# Patient Record
Sex: Female | Born: 1970
Health system: Southern US, Community
[De-identification: ages and names within clinical notes are randomized; demographics above are authoritative.]

## PROBLEM LIST (undated history)

## (undated) DIAGNOSIS — E559 Vitamin D deficiency, unspecified: Secondary | ICD-10-CM

## (undated) DIAGNOSIS — I10 Essential (primary) hypertension: Secondary | ICD-10-CM

## (undated) DIAGNOSIS — F32A Depression, unspecified: Secondary | ICD-10-CM

## (undated) DIAGNOSIS — Z8489 Family history of other specified conditions: Secondary | ICD-10-CM

## (undated) DIAGNOSIS — I77819 Aortic ectasia, unspecified site: Secondary | ICD-10-CM

## (undated) DIAGNOSIS — K5790 Diverticulosis of intestine, part unspecified, without perforation or abscess without bleeding: Secondary | ICD-10-CM

## (undated) DIAGNOSIS — C539 Malignant neoplasm of cervix uteri, unspecified: Secondary | ICD-10-CM

## (undated) DIAGNOSIS — E039 Hypothyroidism, unspecified: Secondary | ICD-10-CM

## (undated) DIAGNOSIS — M47817 Spondylosis without myelopathy or radiculopathy, lumbosacral region: Secondary | ICD-10-CM

## (undated) DIAGNOSIS — F419 Anxiety disorder, unspecified: Secondary | ICD-10-CM

## (undated) DIAGNOSIS — R053 Chronic cough: Secondary | ICD-10-CM

## (undated) DIAGNOSIS — R002 Palpitations: Secondary | ICD-10-CM

## (undated) DIAGNOSIS — F329 Major depressive disorder, single episode, unspecified: Secondary | ICD-10-CM

## (undated) DIAGNOSIS — C801 Malignant (primary) neoplasm, unspecified: Secondary | ICD-10-CM

## (undated) DIAGNOSIS — R7303 Prediabetes: Secondary | ICD-10-CM

## (undated) DIAGNOSIS — J189 Pneumonia, unspecified organism: Secondary | ICD-10-CM

## (undated) DIAGNOSIS — D649 Anemia, unspecified: Secondary | ICD-10-CM

## (undated) DIAGNOSIS — G2581 Restless legs syndrome: Secondary | ICD-10-CM

## (undated) DIAGNOSIS — R05 Cough: Secondary | ICD-10-CM

## (undated) DIAGNOSIS — Z860101 Personal history of adenomatous and serrated colon polyps: Secondary | ICD-10-CM

## (undated) DIAGNOSIS — Z86010 Personal history of colonic polyps: Secondary | ICD-10-CM

## (undated) DIAGNOSIS — R112 Nausea with vomiting, unspecified: Secondary | ICD-10-CM

## (undated) DIAGNOSIS — G8929 Other chronic pain: Secondary | ICD-10-CM

## (undated) DIAGNOSIS — E119 Type 2 diabetes mellitus without complications: Secondary | ICD-10-CM

## (undated) DIAGNOSIS — R200 Anesthesia of skin: Secondary | ICD-10-CM

## (undated) DIAGNOSIS — Z8701 Personal history of pneumonia (recurrent): Secondary | ICD-10-CM

## (undated) DIAGNOSIS — E785 Hyperlipidemia, unspecified: Secondary | ICD-10-CM

## (undated) DIAGNOSIS — K219 Gastro-esophageal reflux disease without esophagitis: Secondary | ICD-10-CM

## (undated) DIAGNOSIS — Z8709 Personal history of other diseases of the respiratory system: Secondary | ICD-10-CM

## (undated) DIAGNOSIS — IMO0002 Reserved for concepts with insufficient information to code with codable children: Secondary | ICD-10-CM

## (undated) DIAGNOSIS — Z9889 Other specified postprocedural states: Secondary | ICD-10-CM

## (undated) DIAGNOSIS — IMO0001 Reserved for inherently not codable concepts without codable children: Secondary | ICD-10-CM

## (undated) DIAGNOSIS — J309 Allergic rhinitis, unspecified: Secondary | ICD-10-CM

## (undated) DIAGNOSIS — R011 Cardiac murmur, unspecified: Secondary | ICD-10-CM

## (undated) DIAGNOSIS — R000 Tachycardia, unspecified: Secondary | ICD-10-CM

## (undated) DIAGNOSIS — G4733 Obstructive sleep apnea (adult) (pediatric): Secondary | ICD-10-CM

## (undated) DIAGNOSIS — E7800 Pure hypercholesterolemia, unspecified: Secondary | ICD-10-CM

## (undated) DIAGNOSIS — M549 Dorsalgia, unspecified: Secondary | ICD-10-CM

## (undated) DIAGNOSIS — R0600 Dyspnea, unspecified: Secondary | ICD-10-CM

## (undated) DIAGNOSIS — J383 Other diseases of vocal cords: Secondary | ICD-10-CM

## (undated) DIAGNOSIS — E669 Obesity, unspecified: Secondary | ICD-10-CM

## (undated) HISTORY — DX: Obesity, unspecified: E66.9

## (undated) HISTORY — PX: OTHER SURGICAL HISTORY: SHX169

## (undated) HISTORY — PX: TUBAL LIGATION: SHX77

## (undated) HISTORY — DX: Spondylosis without myelopathy or radiculopathy, lumbosacral region: M47.817

## (undated) HISTORY — DX: Essential (primary) hypertension: I10

## (undated) HISTORY — DX: Vitamin D deficiency, unspecified: E55.9

## (undated) HISTORY — DX: Prediabetes: R73.03

## (undated) HISTORY — DX: Aortic ectasia, unspecified site: I77.819

## (undated) HISTORY — DX: Anemia, unspecified: D64.9

## (undated) HISTORY — PX: KNEE ARTHROSCOPY: SUR90

## (undated) HISTORY — DX: Obstructive sleep apnea (adult) (pediatric): G47.33

## (undated) HISTORY — DX: Hypothyroidism, unspecified: E03.9

---

## 1992-09-16 HISTORY — PX: CHOLECYSTECTOMY: SHX55

## 1994-09-16 HISTORY — PX: ABDOMINAL HYSTERECTOMY: SHX81

## 2002-09-16 HISTORY — PX: NOSE SURGERY: SHX723

## 2003-03-24 ENCOUNTER — Encounter: Payer: Self-pay | Admitting: Internal Medicine

## 2003-03-24 ENCOUNTER — Ambulatory Visit (HOSPITAL_COMMUNITY): Admission: RE | Admit: 2003-03-24 | Discharge: 2003-03-24 | Payer: Self-pay | Admitting: Family Medicine

## 2003-07-04 ENCOUNTER — Encounter: Payer: Self-pay | Admitting: Internal Medicine

## 2005-11-15 ENCOUNTER — Ambulatory Visit: Payer: Self-pay | Admitting: Cardiology

## 2005-12-12 ENCOUNTER — Ambulatory Visit: Payer: Self-pay | Admitting: *Deleted

## 2006-09-23 ENCOUNTER — Emergency Department (HOSPITAL_COMMUNITY): Admission: EM | Admit: 2006-09-23 | Discharge: 2006-09-23 | Payer: Self-pay | Admitting: Emergency Medicine

## 2007-03-30 ENCOUNTER — Ambulatory Visit: Payer: Self-pay | Admitting: Internal Medicine

## 2007-04-02 ENCOUNTER — Ambulatory Visit: Payer: Self-pay | Admitting: Internal Medicine

## 2007-04-02 ENCOUNTER — Encounter: Payer: Self-pay | Admitting: Internal Medicine

## 2007-04-02 DIAGNOSIS — K2970 Gastritis, unspecified, without bleeding: Secondary | ICD-10-CM | POA: Insufficient documentation

## 2007-04-02 DIAGNOSIS — K2990 Gastroduodenitis, unspecified, without bleeding: Secondary | ICD-10-CM

## 2007-06-29 ENCOUNTER — Emergency Department (HOSPITAL_COMMUNITY): Admission: EM | Admit: 2007-06-29 | Discharge: 2007-06-29 | Payer: Self-pay | Admitting: Emergency Medicine

## 2008-01-04 DIAGNOSIS — K219 Gastro-esophageal reflux disease without esophagitis: Secondary | ICD-10-CM | POA: Insufficient documentation

## 2008-01-04 DIAGNOSIS — F411 Generalized anxiety disorder: Secondary | ICD-10-CM | POA: Insufficient documentation

## 2008-01-04 DIAGNOSIS — R5381 Other malaise: Secondary | ICD-10-CM | POA: Insufficient documentation

## 2008-01-04 DIAGNOSIS — R519 Headache, unspecified: Secondary | ICD-10-CM | POA: Insufficient documentation

## 2008-01-04 DIAGNOSIS — I10 Essential (primary) hypertension: Secondary | ICD-10-CM | POA: Insufficient documentation

## 2008-01-04 DIAGNOSIS — E782 Mixed hyperlipidemia: Secondary | ICD-10-CM | POA: Insufficient documentation

## 2008-01-04 DIAGNOSIS — F3289 Other specified depressive episodes: Secondary | ICD-10-CM | POA: Insufficient documentation

## 2008-01-04 DIAGNOSIS — F329 Major depressive disorder, single episode, unspecified: Secondary | ICD-10-CM

## 2008-01-04 DIAGNOSIS — R51 Headache: Secondary | ICD-10-CM | POA: Insufficient documentation

## 2008-01-04 DIAGNOSIS — R5383 Other fatigue: Secondary | ICD-10-CM

## 2008-02-02 ENCOUNTER — Encounter (INDEPENDENT_AMBULATORY_CARE_PROVIDER_SITE_OTHER): Payer: Self-pay | Admitting: *Deleted

## 2008-02-02 ENCOUNTER — Telehealth: Payer: Self-pay | Admitting: Internal Medicine

## 2008-02-02 ENCOUNTER — Ambulatory Visit: Payer: Self-pay | Admitting: Gastroenterology

## 2008-02-02 DIAGNOSIS — R1013 Epigastric pain: Secondary | ICD-10-CM | POA: Insufficient documentation

## 2008-02-10 LAB — CONVERTED CEMR LAB
BUN: 7 mg/dL (ref 6–23)
CO2: 29 meq/L (ref 19–32)
Calcium: 9.1 mg/dL (ref 8.4–10.5)
Chloride: 107 meq/L (ref 96–112)
Creatinine, Ser: 0.8 mg/dL (ref 0.4–1.2)
GFR calc Af Amer: 104 mL/min
GFR calc non Af Amer: 86 mL/min
Glucose, Bld: 165 mg/dL — ABNORMAL HIGH (ref 70–99)
Hgb A1c MFr Bld: 6.2 % — ABNORMAL HIGH (ref 4.6–6.0)
Potassium: 4.1 meq/L (ref 3.5–5.1)
Sodium: 142 meq/L (ref 135–145)

## 2008-02-11 ENCOUNTER — Observation Stay (HOSPITAL_COMMUNITY): Admission: EM | Admit: 2008-02-11 | Discharge: 2008-02-12 | Payer: Self-pay | Admitting: Emergency Medicine

## 2008-02-11 ENCOUNTER — Ambulatory Visit: Payer: Self-pay | Admitting: Cardiology

## 2008-02-12 ENCOUNTER — Encounter: Payer: Self-pay | Admitting: Cardiology

## 2008-02-15 ENCOUNTER — Encounter: Payer: Self-pay | Admitting: Cardiology

## 2008-02-15 ENCOUNTER — Ambulatory Visit (HOSPITAL_COMMUNITY): Admission: RE | Admit: 2008-02-15 | Discharge: 2008-02-15 | Payer: Self-pay | Admitting: Cardiology

## 2008-02-20 ENCOUNTER — Ambulatory Visit: Payer: Self-pay | Admitting: Cardiology

## 2008-02-24 ENCOUNTER — Ambulatory Visit: Payer: Self-pay | Admitting: Cardiology

## 2008-02-26 ENCOUNTER — Telehealth: Payer: Self-pay | Admitting: Internal Medicine

## 2008-02-26 ENCOUNTER — Ambulatory Visit: Payer: Self-pay | Admitting: Internal Medicine

## 2008-02-26 DIAGNOSIS — K589 Irritable bowel syndrome without diarrhea: Secondary | ICD-10-CM | POA: Insufficient documentation

## 2008-03-30 ENCOUNTER — Ambulatory Visit: Payer: Self-pay | Admitting: Cardiology

## 2008-04-01 ENCOUNTER — Ambulatory Visit: Admission: RE | Admit: 2008-04-01 | Discharge: 2008-04-01 | Payer: Self-pay | Admitting: Internal Medicine

## 2008-05-22 ENCOUNTER — Emergency Department (HOSPITAL_COMMUNITY): Admission: EM | Admit: 2008-05-22 | Discharge: 2008-05-22 | Payer: Self-pay | Admitting: Emergency Medicine

## 2008-08-15 DIAGNOSIS — R0602 Shortness of breath: Secondary | ICD-10-CM | POA: Insufficient documentation

## 2008-08-15 DIAGNOSIS — R002 Palpitations: Secondary | ICD-10-CM | POA: Insufficient documentation

## 2008-08-15 DIAGNOSIS — R079 Chest pain, unspecified: Secondary | ICD-10-CM | POA: Insufficient documentation

## 2009-02-28 ENCOUNTER — Emergency Department (HOSPITAL_COMMUNITY): Admission: EM | Admit: 2009-02-28 | Discharge: 2009-02-28 | Payer: Self-pay | Admitting: Emergency Medicine

## 2009-03-21 ENCOUNTER — Encounter (INDEPENDENT_AMBULATORY_CARE_PROVIDER_SITE_OTHER): Payer: Self-pay | Admitting: *Deleted

## 2009-03-23 ENCOUNTER — Encounter (INDEPENDENT_AMBULATORY_CARE_PROVIDER_SITE_OTHER): Payer: Self-pay | Admitting: *Deleted

## 2009-04-05 ENCOUNTER — Ambulatory Visit (HOSPITAL_COMMUNITY): Admission: RE | Admit: 2009-04-05 | Discharge: 2009-04-05 | Payer: Self-pay | Admitting: Internal Medicine

## 2009-05-31 ENCOUNTER — Encounter: Payer: Self-pay | Admitting: Internal Medicine

## 2009-06-06 ENCOUNTER — Telehealth: Payer: Self-pay | Admitting: Internal Medicine

## 2009-07-17 ENCOUNTER — Emergency Department (HOSPITAL_COMMUNITY): Admission: EM | Admit: 2009-07-17 | Discharge: 2009-07-17 | Payer: Self-pay | Admitting: Emergency Medicine

## 2009-07-19 ENCOUNTER — Telehealth: Payer: Self-pay | Admitting: Internal Medicine

## 2009-09-05 ENCOUNTER — Telehealth: Payer: Self-pay | Admitting: Internal Medicine

## 2009-10-10 ENCOUNTER — Telehealth: Payer: Self-pay | Admitting: Internal Medicine

## 2009-11-22 ENCOUNTER — Telehealth: Payer: Self-pay | Admitting: Internal Medicine

## 2009-11-23 ENCOUNTER — Encounter: Payer: Self-pay | Admitting: Internal Medicine

## 2010-10-07 ENCOUNTER — Encounter: Payer: Self-pay | Admitting: *Deleted

## 2010-10-16 NOTE — Progress Notes (Signed)
Summary: Sample of Aciphex   Phone Note Call from Patient Call back at [phone]   Caller: Patient Call For: Dr. Leone Payor Reason for Call: Talk to Nurse Summary of Call: Pt. is wanting some samples of Aciphex Initial call taken by: Karna Christmas,  October 10, 2009 8:54 AM  Follow-up for Phone Call        message left for pt that samples for 2 weeks are at front desk.  Also notified pt that this will be the last samples she is given and if cost is an issue we can switch her to something cheaper.  Pt advised to ask for me if she has any questions when she picks up samples.  Follow-up by: Francee Piccolo CMA Duncan Dull),  October 11, 2009 8:50 AM    New/Updated Medications: ACIPHEX 20 MG TBEC (RABEPRAZOLE SODIUM) Take 1 tablet by mouth once a day

## 2010-10-16 NOTE — Medication Information (Signed)
Summary: Approved/ACS  Approved/ACS   Imported By: Lester [place] 11/28/2009 08:45:01  _____________________________________________________________________  External Attachment:    Type:   Image     Comment:   External Document

## 2010-10-16 NOTE — Progress Notes (Signed)
Summary: Samples of AcipHex  Phone Note Call from Patient Call back at [phone]   Caller: Patient Call For: Dr. Leone Payor Reason for Call: Refill Medication Summary of Call: would like samples of Aciphex Initial call taken by: Vallarie Mare,  November 22, 2009 10:56 AM  Follow-up for Phone Call        pt notified samples left at front desk. Follow-up by: Francee Piccolo CMA Duncan Dull),  November 22, 2009 1:41 PM    New/Updated Medications: ACIPHEX 20 MG TBEC (RABEPRAZOLE SODIUM) Take 1 tablet by mouth once a day

## 2010-11-24 ENCOUNTER — Emergency Department (HOSPITAL_COMMUNITY)
Admission: EM | Admit: 2010-11-24 | Discharge: 2010-11-24 | Disposition: A | Discharge status: Home / Self Care | Payer: Self-pay | Attending: Emergency Medicine | Admitting: Emergency Medicine

## 2010-11-24 DIAGNOSIS — R070 Pain in throat: Secondary | ICD-10-CM | POA: Insufficient documentation

## 2010-11-24 DIAGNOSIS — H6690 Otitis media, unspecified, unspecified ear: Secondary | ICD-10-CM | POA: Insufficient documentation

## 2010-11-24 DIAGNOSIS — R220 Localized swelling, mass and lump, head: Secondary | ICD-10-CM | POA: Insufficient documentation

## 2010-11-24 DIAGNOSIS — R059 Cough, unspecified: Secondary | ICD-10-CM | POA: Insufficient documentation

## 2010-11-24 DIAGNOSIS — I10 Essential (primary) hypertension: Secondary | ICD-10-CM | POA: Insufficient documentation

## 2010-11-24 DIAGNOSIS — E785 Hyperlipidemia, unspecified: Secondary | ICD-10-CM | POA: Insufficient documentation

## 2010-11-24 DIAGNOSIS — J3489 Other specified disorders of nose and nasal sinuses: Secondary | ICD-10-CM | POA: Insufficient documentation

## 2010-11-24 DIAGNOSIS — R05 Cough: Secondary | ICD-10-CM | POA: Insufficient documentation

## 2010-11-24 DIAGNOSIS — K219 Gastro-esophageal reflux disease without esophagitis: Secondary | ICD-10-CM | POA: Insufficient documentation

## 2010-11-24 DIAGNOSIS — H9209 Otalgia, unspecified ear: Secondary | ICD-10-CM | POA: Insufficient documentation

## 2010-11-24 DIAGNOSIS — F411 Generalized anxiety disorder: Secondary | ICD-10-CM | POA: Insufficient documentation

## 2010-11-24 DIAGNOSIS — E119 Type 2 diabetes mellitus without complications: Secondary | ICD-10-CM | POA: Insufficient documentation

## 2010-11-24 LAB — GLUCOSE, CAPILLARY: Glucose-Capillary: 88 mg/dL (ref 70–99)

## 2010-12-19 LAB — CBC
HCT: 38.7 % (ref 36.0–46.0)
Hemoglobin: 13 g/dL (ref 12.0–15.0)
MCHC: 33.5 g/dL (ref 30.0–36.0)
MCV: 84.4 fL (ref 78.0–100.0)
RBC: 4.59 MIL/uL (ref 3.87–5.11)
RDW: 14.7 % (ref 11.5–15.5)

## 2010-12-19 LAB — BASIC METABOLIC PANEL
BUN: 8 mg/dL (ref 6–23)
Chloride: 104 mEq/L (ref 96–112)
GFR calc Af Amer: >60 mL/min (ref >60)
Sodium: 140 mEq/L (ref 135–145)

## 2010-12-19 LAB — URINALYSIS, ROUTINE W REFLEX MICROSCOPIC
Bilirubin Urine: NEGATIVE
Hgb urine dipstick: NEGATIVE
Ketones, ur: NEGATIVE mg/dL
Nitrite: NEGATIVE
Protein, ur: NEGATIVE mg/dL
Specific Gravity, Urine: 1.025 (ref 1.005–1.030)
Urobilinogen, UA: 0.2 mg/dL (ref 0.0–1.0)
pH: 5.5 (ref 5.0–8.0)

## 2010-12-19 LAB — DIFFERENTIAL
Basophils Absolute: 0 10*3/uL (ref 0.0–0.1)
Basophils Relative: 0 % (ref 0–1)
Eosinophils Absolute: 0.2 10*3/uL (ref 0.0–0.7)
Lymphocytes Relative: 28 % (ref 12–46)
Neutro Abs: 6.1 10*3/uL (ref 1.7–7.7)

## 2011-01-29 NOTE — Procedures (Signed)
Kristina [name], Kristina [name]              ACCOUNT NO.:  [id number]   MEDICAL RECORD NO.:  [id number]          PATIENT TYPE:  OUT   LOCATION:  RAD                           FACILITY:  [facility]   PHYSICIAN:  Gerrit Friends. Dietrich Pates, MD, FACCDATE OF BIRTH:  Oct 08, 1970   DATE OF PROCEDURE:  02/15/2008  DATE OF DISCHARGE:                                ECHOCARDIOGRAM   REFERRING PHYSICIAN:  Catalina Pizza, MD   CLINICAL DATA:  This is a 40 year old woman recently admitted to  hospital for chest pain.   1. Treadmill exercise performed to a workload of 7 METS and a heart      rate of 168, 91% of age-predicted maximum.  Exercise discontinued      due to severe dyspnea; no chest pain reported.  2. Blood pressure increased from a resting value of 130/80 to 150/90      at peak exercise, a fairly normal response.  No arrhythmias noted.  3. EKG:  Sinus tachycardia; right axis deviation; J-point elevation;      nondiagnostic inferior Q waves; possible prior anteroseptal      myocardial infarction; low voltage in the V leads.  4. Stress EKG:  No significant change.  5. Baseline echocardiogram:  Normal chamber dimensions; normal right      ventricular function; normal mitral and aortic valves; normal      regional and global LV systolic function.  6. Post-exercise echocardiogram:  Increased contractility in all      myocardial segments.   IMPRESSION:  Negative stress echocardiogram revealing somewhat impaired  exercise capacity, no significant stress-induced EKG abnormalities and  no echocardiographic evidence for ischemia or infarction.  Other  findings as noted.      Gerrit Friends. Dietrich Pates, MD, [facility]  Electronically Signed     RMR/MEDQ  D:  02/16/2008  T:  02/16/2008  Job:  [id number]

## 2011-01-29 NOTE — Assessment & Plan Note (Signed)
[facility]                         GASTROENTEROLOGY OFFICE NOTE   NAME:[name], Kristina CHACKO                     MRN:          [id number]  DATE:03/30/2007                            DOB:          21-Apr-1971    REFERRING PHYSICIAN:  Tammy R. Collins [name], M.D.   CHIEF COMPLAINT:  Epigastric pain.   ASSESSMENT:  A 40 year old white woman with recent problems with  gastroenteritis, also positive Helicobacter pylori serology.  Since that  time, she has had persistent epigastric pain, nausea without vomiting.  She has had some loose stools.   I think it is quite possible she has a postinfectious gastroenteritis.  I think some if not most of her epigastric pain is musculoskeletal from  previous vomiting.  She does have reflux disease which is somewhat  atypical.  She is not responding to Nexium twice a day, and has been  taking Prilosec on top of that, as well as some Pepcid AC.   RECOMMENDATIONS AND PLAN:  1. Schedule upper GI endoscopy to investigate the epigastric pain.  2. Trial of Pamine Forte generic 1 b.i.d. (5 mg) for pain, loose      stools, and nausea, and what I think are irritable bowel problems.  3. Change to Zegerid 40 mg twice a day, and use that for her reflux      problems.  4. Further plans depending on clinical course.  5. She really needs to lose weight.  I had seen her in 2004, and she      was obese at 225 pounds.  She is now 267 pounds and says she has      gained 40 pounds in the last few months and does not know why.  She      denies any significant situational stressors at this time.  She      does use some caffeine at about 24 ounces a day and needs to work      on reducing that.   HISTORY:  See medical history form for full details and also Dr. Alda Berthold  note.  She was treated for H. pylori over a month ago with  metronidazole, tetracycline, and Pepto-Bismol tabs, as well as  omeprazole twice a day.  She had been having some  odynophagia at one  point and had pharyngitis at that time.  She has been having some loose  stools that have been somewhat dark.  She is not having any rectal  bleeding.  She was heme negative on recent office visit.   Laboratory testing on March 05, 2007 showed a normal CBC.  She had other  negative occult blood testing as well on March 09, 2007.  LFTs normal.  BMET normal.   MEDICATIONS:  1. Nexium 40 mg b.i.d.  2. Prilosec every morning.  3. Pepcid p.r.n.  4. Phenergan p.r.n.   DRUG ALLERGIES:  1. CODEINE.  2. AMPICILLIN.  3. SULFA.   PAST MEDICAL HISTORY:  1. Obesity.  2. Hypertension.  3. Gastroesophageal reflux disease.  4. Anxiety.  5. Chronic headaches.  6. Dyslipidemia.  7. Depression.  8. Cholecystectomy in 1994.  9. Hysterectomy in 1996.  10.Possible sleep apnea.  11.According to Dr. Alda Berthold record, she has been on Premarin, Crestor,      Flonase, HCTZ p.r.n., Mobic p.r.n. back in May 2008, although she      is apparently not taking all of those anymore.  12.She has also used some Lasix p.r.n.   FAMILY HISTORY/SOCIAL HISTORY/REVIEW OF SYSTEMS:  See my medical history  form for full details.  She does not use alcohol except on special  occasions.  She does not smoke.  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  She is divorced.  Two sons, one daughter.  Family history is noncontributory, except for her mother having had  colon polyps.  Review of systems notably positive for severe fatigue.  See that list otherwise.   PHYSICAL EXAMINATION:  GENERAL:  Reveals an obese white woman who is in  mild distress.  VITAL SIGNS:  Height 5 feet 2 inches, weight 267 pounds, blood pressure  118/72, pulse 108, pulse is regular.  HEENT:  Eyes anicteric.  Mouth:  Posterior pharynx free of lesions.  Mucous membranes are moist.  NECK:  Supple.  No thyromegaly or mass.  CHEST:  Clear.  HEART:  S1, S2.  No murmurs, rubs, or gallops.  ABDOMEN:  Soft, somewhat  tender in the epigastrium.  She is tender in  the xiphoid.  The pain is worse when she has tension of the muscles  rising up.  There is no organomegaly or mass or hernia.  LOWER EXTREMITIES:  Showed trace bilateral lower extremity edema.  SKIN:  Warm, dry.  No acute rash.  LYMPHATIC:  No neck or supraclavicular nodes.  NEUROLOGIC/PSYCHIATRIC:  She is somewhat anxious, but she is alert and  oriented x3.   I appreciate the opportunity to care for this patient.     Iva Boop, MD,FACG  Electronically Signed    CEG/MedQ  DD: 03/30/2007  DT: 03/31/2007  Job #: [id number]   cc:   Kristina [name], M.D.

## 2011-01-29 NOTE — H&P (Signed)
[name], Kristina [name]              ACCOUNT NO.:  [id number]   MEDICAL RECORD NO.:  [id number]          PATIENT TYPE:  INP   LOCATION:  A332                          FACILITY:  [facility]   PHYSICIAN:  Catalina Pizza, M.D.        DATE OF BIRTH:  [birth year]   DATE OF ADMISSION:  02/11/2008  DATE OF DISCHARGE:  LH                              HISTORY & PHYSICAL   CHIEF COMPLAINT:  Chest pain and left arm numbness.   HISTORY OF PRESENT ILLNESS:  Kristina [name] is a 40 year old morbidly  obese white female, who recently had abdominal pain and nausea and  vomiting, felt related to either gastritis or gastroenteritis, was  treated for H.  pylori approximately 3 weeks ago, finish antibiotics at  that time.  She states she was getting back to normal until this morning  when she woke up approximately 4 a.m. with numbness in her left arm and  pain in her left biceps area, somewhat sharp.  She then began having  more chest tightness.  She did have a little pain, which she described  as sharp in her left upper chest.  This did improve somewhat, pain rated  initially about 6/10.  She got up and got moving and hoped this would  improve, but did not and came to emergency department for further  evaluation.  She stated that she feels that her breathing is a little  bit worse.  She continued to have the numbness in her left arm.  She has  risk factors including hyperlipidemia, hypertension, and significant  family history for cardiac issues.  It has been some time since she has  had any cardiac workup.  She will be admitted for further observation.  She also notes that she has had some more nausea this morning, which had  previously resolved from previous abdominal issue.   PAST MEDICAL HISTORY:  Significant for:  1. Obesity.  2. Hypertension.  3. Gastroesophageal reflux disease.  4. Anxiety.  5. Chronic headaches.  6. Hyperlipidemia.  7. Depression.  8. Question sleep apnea.  9. Cholecystectomy in 1994,  hysterectomy in 1996.  10.Question asthma/reactive airway disease.   ALLERGIES:  CODEINE, AMPICILLIN, and SULFA.   MEDICATIONS:  She is on:  1. Zegerid 40 mg p.o. b.i.d.  2. Crestor 20 mg p.o. daily.  3. Estradiol 1.5 mg p.o. daily.  4. She has Singulair 10 mg, which takes p.r.n.  5. Symbicort 1 puff b.i.d., which she also takes p.r.n.  6. Started on Carafate taking that just prior to meals.  7. She is also I believe started on Bentyl 10 mg b.i.d. for irritable      bowel type symptoms.   REVIEW OF SYSTEMS:  As per HPI, she states that she continues to have  numbness in her left arm.  No blurry vision, no double vision, no  significant headache.  Also, has what she describes as some numbness in  her right foot moving all extremities.  She denies any other neurologic  deficits as per HPI chest pain, shortness of breath, or nausea.  No  significant  constipation or diarrhea.  No vomiting.   SOCIAL HISTORY:  She does not smoke, very rare alcohol.  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   FAMILY HISTORY:  Significant family history.  Apparently, her mother has  known coronary artery disease and significant COPD.  Her father died at  age 11 of apparent massive heart attack.  She had a brother who died at  age 90 of aspiration related to what they believe is either cardiac  event or a stroke, and a sister who died at age 47 of some type of  cardiopulmonary arrest.   PHYSICAL EXAMINATION:  VITAL SIGNS:  Initially coming in, her a  temperature is 97.2, blood pressure 166/109, heart rate is 96, pulse ox  showed 99% on 2 L of oxygen.  GENERAL:  This is an obese white female, lying in bed, in no acute  distress.  HEENT:  Pupils are equal, round, and react to light and accommodation.  No scleral icterus.  Oropharynx is clear.  NECK:  Supple.  No thyromegaly.  No JVD.  LUNGS:  Clear to auscultation bilaterally.  No  rhonchi or wheezing.  HEART:  Regular rate and rhythm.  No murmurs, gallops, or rubs.  ABDOMEN:  Protuberant, but soft.  Positive bowel sounds, question  hyperactive at times.  No hepatosplenomegaly.  EXTREMITIES:  Trace lower  extremity edema bilaterally.  SKIN:  Warm and dry.  NEUROLOGIC:  The patient is mildly anxious, but alert and oriented x3.  She does have subjectively some decreased sensation to touch on the left  arm and question the right leg, just to soft touch.  She has 5/5  strength in all extremities.   LABORATORY DATA:  CBC shows white count of 8.2, hemoglobin of 12.7, and  platelet count of 218.  BMET showed sodium 139, potassium of 4.1,  chloride 108, CO2 of 25, glucose 107, BUN 11, creatinine 0.66, and  calcium of 8.9.  Initial cardiac marker showed MB of less than 1.0,  troponin I less than 0.05, and myoglobin of 30.6.  Second set of cardiac  markers showed MB of less than 1.0, troponin I less than 0.05, myoglobin  of 25.3.  D-dimer is 0.22.  Chest x-ray shows no active cardiopulmonary  disease, relatively clear.   EKG findings showed normal sinus rhythm with question of first-degree AV  block.  No specific ST or T-wave abnormalities.  No old EKG for  comparison.   IMPRESSION:  This is a 40 year old morbidly obese white female with  several risk factors, who presents with chest pain and numbness in the  left arm.   ASSESSMENT/PLAN:  1. Chest pain.  Given her multiple risk factors, we will continue      cycle enzymes, although first two sets are negative.  No specific      acute EKG findings.  She has had a stress test approximately 6 or 7      years ago, but nothing recently.  No cardiac evaluation recently.      She does have several risk factors including a significant family      history of morbid obesity, hyperlipidemia, hypertension, and      question early diabetes.  Because of this, we will admit for      observation and get Cardiology to further evaluate  as she describes      this somewhat atypical, but many features could  be angina related,      and we will get cardiology input on this.  We will continue      sublingual nitroglycerin if needed for this pain management, and      the patient's given dose of aspirin, we will continue oxygen for      now and Dilaudid for significant chest pain.  2. Left arm numbness.  I question whether this is related to some      neuropathy related to cervicalgia or whether this is related to      something intracranial given history of family history of strokes      at early age before.  We will get a CT scan of her head since this      has been approximately 6 hours out, may show something on CT scan,      may warrant getting an MRI if nothing shows up on CT scan.  3. Elevated blood sugars.  I question of early diabetes never been      diagnosed specifically for this, but is set up for this given her      morbid obesity.  She did have an A1c recently checked, which was      6.2.  We will continue to monitor in the hospital and treat      appropriately depending on what the blood sugars are running.  She      is to be on an ADA diet.  4. Shortness of breath/reactive airway disease.  We will write for      Xopenex nebulizer q.6 h. p.r.n. for shortness of breath and      wheezing.  I do not feel this is related to anything such as      pulmonary embolism.  D-dimer was low, but in the full cardiac      workup, Cardiology may feel this is to rule out, although D-dimer      is negative.  We will continue with O2 at 2 L by nasal cannula.  5. Gastroesophageal reflux disease.  She has significant issues      related to reflux disease and gastritis, has recently seen GI      doctor and will continue on the Zegerid for this.  6. Hyperlipidemia.  We will continue on the Crestor.   DISPOSITION:  The patient will be admitted for observation to telemetry  bed and have further evaluation by Cardiology, very difficult  to figure  out exactly what was etiology of her issues, and will have multiple  other tests or run at this time.      Catalina Pizza, M.D.  Electronically Signed     ZH/MEDQ  D:  02/11/2008  T:  02/11/2008  Job:  [id number]

## 2011-01-29 NOTE — Consult Note (Signed)
NAMESHAKYRA, MATTERA              ACCOUNT NO.:  [id number]   MEDICAL RECORD NO.:  [id number]          PATIENT TYPE:  INP   LOCATION:  A332                          FACILITY:  [facility]   PHYSICIAN:  Gerrit Friends. Dietrich Pates, MD, FACCDATE OF BIRTH:  1971-04-13   DATE OF CONSULTATION:  02/12/2008  DATE OF DISCHARGE:                                 CONSULTATION   PRIMARY CARE PHYSICIAN:  Dr. Catalina Pizza.   HISTORY OF PRESENT ILLNESS:  A 40 year old woman with a history of  obesity, borderline hypertension and hyperlipidemia, referred for  assessment of chest discomfort.  Ms. Puccio has no history of  cardiovascular disease other than borderline hypertension that was  treated pharmacologically for a period of time.  In recent years, she  has not appeared to require antihypertensive medication after losing  some weight.  She has had hyperlipidemia that has been treated  pharmacologically.  She has not been a cigarette smoker.  She has not  had diabetes in the past, but was noted to have hyperglycemia on  admission to hospital on this occasion.  She awoke this morning with a  feeling of chest heaviness, which is not unusual for her, but also had  moderate to severe pain in the left arm associated with left arm and  hand numbness.  She also noted dyspnea, nausea and diaphoresis.  There  was no emesis.  The pain waxed and waned, but basically persistent  prompting her to seek assistance in the emergency department.  EKG and  cardiac markers were negative.  She was admitted for further diagnostic  evaluation.  The pain gradually resolved in the emergency department.  There was no significant response to analgesics other than narcotics.   PAST MEDICAL HISTORY:  Otherwise notable for GERD.  She was positive for  H pylori in the past and was treated appropriately.  She has been  followed by [facility].  She also has anxiety, chronic headaches,  depression and asthma.  A question of sleep apnea has been  raised in the  past, but it sounds as if she had a sleep study that was negative.  Prior surgeries have included cholecystectomy and hysterectomy.  She had  a subsequent oophorectomy and a surgical menopause at an early age.   ALLERGIES:  To CODEINE, AMPICILLIN and SULFA DRUGS are reported.   CURRENT MEDICATIONS:  1. Zegerid 40 mg b.i.d.  2. Rosuvastatin 20 mg daily.  3. Estradiol 1.5 mg daily.  4. Singulair 10 mg - not taken continuously.  5. Symbicort 1 inhalation b.i.d. also not use continuously.  6. Carafate a.c. t.i.d.  7. Bentyl 10 mg b.i.d.  8. She was treated with Diltiazem in the past for hypertension, which      caused fatigue.   FAMILY HISTORY:  Significant for a strong history of sudden death at a  young age including her brother, her sister, and her father and uncle.  Her mother developed coronary disease at an advanced age and also had  COPD.   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  She is divorced with 3 children.   REVIEW OF SYSTEMS:  Also has had a sinus surgery in 2005.  She has  intermittent arthritic discomfort including in her hands.  She  occasionally notes nausea with emesis.  She follows a regular diet.  She  does not require corrective lenses.  She sleeps well and does not have  daytime somnolence.  All other systems reviewed and are negative.   EXAM:  Overweight pleasant woman in no acute distress.  The temperature is 97.8, blood pressure 110/65, heart rate 100,  respirations 20, O2 saturation 97% on 2 liters, weight 127 kg.  Afebrile.  EENT:  Anicteric sclerae; normal lids and conjunctivae; normal oral  mucosa.  NECK:  No jugular venous distention; normal carotid upstrokes without  bruits.  LUNGS:  Clear.  CARDIAC:  Normal first and second heart sounds; modest systolic ejection  murmur.  ABDOMEN:  Obese; otherwise benign.  EXTREMITIES:  Normal distal pulses; no edema.  NEUROMUSCULAR:  Symmetric  strength and tone; normal cranial nerves.   EKG:  Borderline first-degree AV block; borderline QT prolongation;  otherwise unremarkable.   LABORATORY:  Otherwise notable for normal CBC, normal D-dimer, normal  chemistry profile and normal cardiac markers.   IMPRESSION:  Although Ms. Shivley has some risk factors for coronary  disease, she is still an extremely low risk.  With a negative EKG during  chest discomfort, that risk is further reduced.  We will plan for a  stress echocardiogram to be performed in the morning.  If results are  negative, she can be discharged thereafter.  I would also consider  degenerative joint disease of the cervical spine as the possible  etiology for her symptoms.   More concern is the strong family history for sudden death and  borderline QT interval.  We will provide her with an event recorder  since she has episodes of dizziness.  She has not experienced any  episodes of loss of consciousness, which is somewhat reassuring.  If she  has no demonstrable arrhythmias, I will discuss further evaluation and  treatment with our Electrophysiologists.  Certainly, her father's and  her uncles' first-degree relatives should be referred for EKGs and  echocardiograms.  Other diagnostic considerations in this family include  hypertrophic cardiomyopathy and right ventricular dysplasia.  Ms.  Padovano has already had an echocardiogram in the past without evidence  for hypertrophy, but we are not certain that she is an affected  individual in the [facility].      Gerrit Friends. Dietrich Pates, MD, [facility]  Electronically Signed     RMR/MEDQ  D:  02/12/2008  T:  02/12/2008  Job:  [id number]

## 2011-01-29 NOTE — Procedures (Signed)
NAME:  Kristina [name], [facility]              ACCOUNT NO.:  [id number]   MEDICAL RECORD NO.:  [id number]           PATIENT TYPE:  OUT   LOCATION:  SLEEP                         FACILITY:  [facility]   PHYSICIAN:  Kofi A. Gerilyn Pilgrim, M.D. DATE OF BIRTH:  1970/12/21   DATE OF PROCEDURE:  04/01/2008  DATE OF DISCHARGE:                             SLEEP DISORDER REPORT   INDICATIONS:  A 40 year old who presents with hypersomnia and snoring.  The study is being evaluated for obstructive sleep apnea syndrome.   MEDICATIONS:  Singular, Estradiol, Zoloft, Ativan, Chlorthalidone,  Crestor, Byetta, and Zegerid.  Epworth sleepiness scale 13.  BMI 50.   SLEEP STATE SUMMARY:  The total recording time is 418 minutes.  Sleep  efficiency 97%.  Sleep latency 6.5 minutes, REM latency 136 minutes.  Stage N1 5.5% and 257.2% and 328.4%, and REM sleep 8.9%.   RESPIRATORY SUMMARY:  The baseline oxygen saturation is 94%.  Lowest  saturation 89%.  AHI 6.4.   LIMB MOVEMENT SUMMARY:  The PLM index is 4.1.   ELECTROCARDIOGRAM SUMMARY:  Average heart rate 85 with no PVCs observed.   IMPRESSION:  Mild obstructive sleep apnea syndrome, not requiring  positive pressure.   Thanks for this referral.      Kofi A. Gerilyn Pilgrim, M.D.  Electronically Signed     KAD/MEDQ  D:  04/11/2008  T:  04/11/2008  Job:  [id number]   cc:   Catalina Pizza, M.D.  Fax: [phone]

## 2011-01-29 NOTE — Discharge Summary (Signed)
Kristina [name], Kristina [name]              ACCOUNT NO.:  [id number]   MEDICAL RECORD NO.:  [id number]          PATIENT TYPE:  INP   LOCATION:  A332                          FACILITY:  [facility]   PHYSICIAN:  Catalina Pizza, M.D.        DATE OF BIRTH:  06/22/1971   DATE OF ADMISSION:  02/11/2008  DATE OF DISCHARGE:  05/29/2009LH                               DISCHARGE SUMMARY   DISCHARGE DIAGNOSES:  1. Atypical chest pain.  2. Chronic severe gastroesophageal reflux disease.  3. Left arm numbness.  4. Obesity.  5. Hypertension.  6. Diabetes mellitus type 2, new diagnosis.  7. Reactive airway disease.  8. Hyperlipidemia.   DISCHARGE MEDICATIONS:  1. Zegerid 40 mg p.o. b.i.d.  2. Crestor 20 mg p.o. daily.  3. Estradiol 1.5 mg p.o. daily.  4. Singulair 10 mg p.o. daily p.r.n.  5. Symbicort 1 puff b.i.d. p.r.n. but needs to take it routinely.  6. Carafate 1 g prior to meals 4 times a day.  7. (?) Bentyl 10 mg b.i.d. for IBS-type symptoms, unclear.  8. Enteric-coated baby aspirin if does not cause any further GI      irritation.   BRIEF HISTORY OF PRESENT ILLNESS:  Kristina [name] is a 40 year old  morbidly obese white female who has had significant abdominal pain,  nausea, and vomiting and recent treatment for H. Pylori, who presented  with left arm numbness, which she states she woke up with and noted some  chest pain and chest tightness as well.  She came into the emergency  room for further assessment.  The pain was relatively constant.  She did  not have any significant vomiting or diaphoresis.  She did not feel  exactly like heartburn-type symptoms that she has had before.  She is  admitted for further assessment.   PHYSICAL EXAMINATION AT THE TIME OF DISCHARGE:  VITAL SIGNS:  Temperature is 98.1, blood pressure is 105/63, pulse 96, respirations  18, and sating 98% on 2 L of oxygen, 93% on room air.  CBGs were 148,  129, 116, and 86 respectively.  GENERAL:  This is an obese white female, lying  in bed, in no acute  distress.  HEENT:  Unremarkable.  NECK:  Supple.  LUNGS:  Clear to auscultation bilaterally.  No rhonchi or wheezing.  HEART:  Regular rate and rhythm.  No murmurs, gallops, or rubs.  ABDOMEN:  Protuberant, soft, and positive bowel sounds.  EXTREMITIES:  Trace lower extremity edema bilaterally.  SKIN:  Warm and dry.  NEUROLOGIC:  The patient is alert and oriented x3.  No deficits noted.  Sensation was more intact at the time of discharge and left arm more  symmetric compared to the right.  Symmetric reflexes and 5/5 strength.   LABORATORY DATA OBTAINED DURING HOSPITALIZATION:  CBC showed a white  count of 8.2 and hemoglobin of 12.7.  At the time of discharge, white  count was 7.7, hemoglobin 11.8, and platelet count of 195.  D-dimer was  0.22.  Cardiac panel x3 shows CK of 55, 58, and 56 respectively and MB  is 0.07, 0.06,  and 0.05 respectively, and troponin I is 0.01, 0.01, and  0.01.  BMET at the time of discharge shows sodium 143, potassium 3.9,  chloride 107, CO2 of 28, glucose 102, BUN 9, creatinine 0.79, and  calcium 8.9.  Lipid profile showed total cholesterol 152, triglycerides  126, HDL 34, and LDL of 93.   EKG shows borderline first-degree AV block and borderline QT  prolongation, but no significant ST-wave changes.   RADIOGRAPHIC FINDINGS:  Chest x-ray shows no active cardiopulmonary  disease.  CT of head showed normal CT of the head without contrast.  No  significant findings of stroke noted.   HOSPITAL COURSE PER PROBLEM:  1. Chest pain.  Chest pain resolved somewhat, but continued to have      some constancy, question whether had mild response to nitroglycerin      at all.  Cardiology was consulted and felt that her biggest risk      factor was her family history and that she would have a stress echo      done.  This will be set up as an outpatient.  I did not feel it is      necessary to keep the patient in the hospital over the weekend just       for this.  Question whether the chest pain could be related to her      severe gastroesophageal reflux disease as well.  2. Left arm numbness.  I did do a CT of her head given her odd      presentation with this.  Question whether she had some cervicalgia,      which causes sleeping on her left arm routinely.  Question whether      related this to the chest pain or not, but this did resolve without      any further treatment.  Unknown the exact cause of this.  May need      a further scan of her neck if continues to have signs of      cervicalgia.  3. Elevated blood sugars/diabetes mellitus type 2.  She did have A1c,      which was 6.2 in the office and did have elevated blood sugars      periodically especially during the hospitalization.  She will be      sent home with prescription for a monitor and test strips and to      check routinely and will follow up in 2 weeks Dr. Margo Aye to see if      any medicines are warranted given her readings.  4. Morbid obesity.  This is likely contributing to multiple risk      factors and the patient will be encouraged to have further weight      loss to minimize her risk.  5. Reactive airway disease.  We will continue with the Singulair at      home as well as Symbicort.  She did have some Xopenex nebulizer      during hospitalization, which did help some with her breathing and      p.r.n. O2 which she does not require significantly.  6. Gastroesophageal reflux disease.  She has very significant      gastroesophageal reflux disease and will need to follow up further      with Dr. Leone Payor.  She did have treatment of suspected H. pylori,      but she has been treated twice for this and so should be adequately  treated and should not be based on antibody results, only a biopsy      as far as any further treatment necessary.  7. Hyperlipidemia.  Continue on the Crestor as previously prescribed.   DISPOSITION:  The patient will be discharged home to  home.  If any signs  or symptoms of severe chest pain return, she should return back to the  emergency department, but we will get further assessment by cardiology  on  Monday as far as the stress echo.  May need further workup given her  family history.  Dr. Dietrich Pates was consulted on this and will make  followup as needed.  Will follow up with me in approximately 2 weeks and  review her blood sugar readings.      Catalina Pizza, M.D.  Electronically Signed     ZH/MEDQ  D:  02/20/2008  T:  02/21/2008  Job:  [id number]

## 2011-01-29 NOTE — Letter (Signed)
February 24, 2008    Ernestina Penna, M.D.  [address] [place], [place] [address]   RE:  KANOE, WANNER  MRN:  [id number]  /  DOB:  August 11, 1971   Dear Roe Coombs,   Ms. Rosensteel returns to the office following a recent admission to [facility] with palpitations and chest pain.  Myocardial infarction  was ruled out.  She underwent a stress echocardiogram that showed no  structural heart disease and no evidence for ischemia or infarction.  Her symptoms are improved.  She is carrying an event recorder for her  history of palpitations.  She has mild dyspnea on exertion which is  chronic.   CURRENT MEDICATIONS:  1. Rosuvastatin 20 mg daily.  2. Zegerid one b.i.d.  3. Estradiol 1.5 mg daily.  4. Singulair 10 mg daily.   PHYSICAL EXAMINATION:  GENERAL:  On exam, overweight pleasant woman in  no acute distress.  VITAL SIGNS:  Weight is 283 pounds, 32 more than in March 2007.  Heart  rate 95 and regular, and blood pressure 140/95.  NECK:  No jugular venous distention; normal carotid upstrokes without  bruits.  LUNGS:  Clear.  CARDIAC:  Normal first heart sound; increased intensity of the aortic  component of the second heart sound; modest basilar systolic ejection  murmur.  ABDOMEN:  Soft and nontender.  EXTREMITIES:  Trace edema.   IMPRESSION:  Ms. Dubow is doing well from a symptomatic standpoint.  Her chest discomfort was likely of noncardiac etiology.  Her  palpitations also have improved.  We will await  the results of 3 weeks of event recording.  Blood pressure control is  suboptimal.  Since she already takes a diuretic, we will give her  chlorthalidone 12.5 mg daily and monitor her therapy.  She may require  an ACE inhibitor in addition.  She will return to see the Cardiology  nurses in 1 month.  I will see her again in 6 months.    Sincerely,      Gerrit Friends. Dietrich Pates, MD, [facility]  Electronically Signed    RMR/MedQ  DD: 02/24/2008  DT: 02/25/2008  Job #: [id number]

## 2011-02-11 ENCOUNTER — Inpatient Hospital Stay (HOSPITAL_COMMUNITY)
Admission: EM | Admit: 2011-02-11 | Discharge: 2011-02-15 | DRG: 193 | Disposition: A | Discharge status: Home / Self Care | Payer: Medicaid Other | Source: Ambulatory Visit | Attending: Internal Medicine | Admitting: Internal Medicine

## 2011-02-11 DIAGNOSIS — I10 Essential (primary) hypertension: Secondary | ICD-10-CM | POA: Diagnosis present

## 2011-02-11 DIAGNOSIS — J189 Pneumonia, unspecified organism: Principal | ICD-10-CM | POA: Diagnosis present

## 2011-02-11 DIAGNOSIS — G4733 Obstructive sleep apnea (adult) (pediatric): Secondary | ICD-10-CM | POA: Diagnosis present

## 2011-02-11 DIAGNOSIS — J9600 Acute respiratory failure, unspecified whether with hypoxia or hypercapnia: Secondary | ICD-10-CM | POA: Diagnosis present

## 2011-02-11 DIAGNOSIS — Z6841 Body Mass Index (BMI) 40.0 and over, adult: Secondary | ICD-10-CM

## 2011-02-11 DIAGNOSIS — J45901 Unspecified asthma with (acute) exacerbation: Secondary | ICD-10-CM | POA: Diagnosis present

## 2011-02-11 DIAGNOSIS — E119 Type 2 diabetes mellitus without complications: Secondary | ICD-10-CM | POA: Diagnosis present

## 2011-02-11 HISTORY — DX: Essential (primary) hypertension: I10

## 2011-02-11 LAB — BLOOD GAS, ARTERIAL
Acid-base deficit: 1.3 mmol/L (ref 0.0–2.0)
Bicarbonate: 22.8 mEq/L (ref 20.0–24.0)
TCO2: 20.2 mmol/L (ref 0–100)
pH, Arterial: 7.405 — ABNORMAL HIGH (ref 7.350–7.400)
pO2, Arterial: 62.2 mmHg — ABNORMAL LOW (ref 80.0–100.0)

## 2011-02-12 ENCOUNTER — Emergency Department (HOSPITAL_COMMUNITY): Payer: Medicaid Other

## 2011-02-12 LAB — CBC
HCT: 39.7 % (ref 36.0–46.0)
MCV: 87.3 fL (ref 78.0–100.0)
RBC: 4.55 MIL/uL (ref 3.87–5.11)
WBC: 6.3 10*3/uL (ref 4.0–10.5)

## 2011-02-12 LAB — CARDIAC PANEL(CRET KIN+CKTOT+MB+TROPI)
CK, MB: 1.7 ng/mL (ref 0.3–4.0)
CK, MB: 1.6 ng/mL (ref 0.3–4.0)
Relative Index: 0.4 (ref 0.0–2.5)
Relative Index: 0.5 (ref 0.0–2.5)
Total CK: 366 U/L — ABNORMAL HIGH (ref 7–177)
Troponin I: <0.3 ng/mL (ref <0.30)

## 2011-02-12 LAB — HEMOGLOBIN A1C: Hgb A1c MFr Bld: 6.2 % — ABNORMAL HIGH (ref <5.7)

## 2011-02-12 LAB — DIFFERENTIAL
Basophils Absolute: 0 10*3/uL (ref 0.0–0.1)
Basophils Relative: 1 % (ref 0–1)
Eosinophils Absolute: 0 10*3/uL (ref 0.0–0.7)
Lymphocytes Relative: 15 % (ref 12–46)
Lymphs Abs: 1 10*3/uL (ref 0.7–4.0)
Monocytes Absolute: 0.2 10*3/uL (ref 0.1–1.0)
Monocytes Relative: 3 % (ref 3–12)
Neutro Abs: 5 10*3/uL (ref 1.7–7.7)

## 2011-02-12 LAB — BASIC METABOLIC PANEL
BUN: 10 mg/dL (ref 6–23)
CO2: 25 mEq/L (ref 19–32)
GFR calc Af Amer: >60 mL/min (ref >60)

## 2011-02-12 LAB — GLUCOSE, CAPILLARY
Glucose-Capillary: 129 mg/dL — ABNORMAL HIGH (ref 70–99)
Glucose-Capillary: 126 mg/dL — ABNORMAL HIGH (ref 70–99)
Glucose-Capillary: 175 mg/dL — ABNORMAL HIGH (ref 70–99)

## 2011-02-13 ENCOUNTER — Inpatient Hospital Stay (HOSPITAL_COMMUNITY): Payer: Medicaid Other

## 2011-02-13 LAB — DIFFERENTIAL
Basophils Absolute: 0 10*3/uL (ref 0.0–0.1)
Lymphocytes Relative: 22 % (ref 12–46)
Neutro Abs: 5.9 10*3/uL (ref 1.7–7.7)

## 2011-02-13 LAB — GLUCOSE, CAPILLARY
Glucose-Capillary: 161 mg/dL — ABNORMAL HIGH (ref 70–99)
Glucose-Capillary: 221 mg/dL — ABNORMAL HIGH (ref 70–99)

## 2011-02-13 LAB — BASIC METABOLIC PANEL
Calcium: 9.8 mg/dL (ref 8.4–10.5)
GFR calc Af Amer: >60 mL/min (ref >60)
GFR calc non Af Amer: >60 mL/min (ref >60)
Sodium: 139 mEq/L (ref 135–145)

## 2011-02-13 LAB — CBC
Hemoglobin: 12.5 g/dL (ref 12.0–15.0)
RBC: 4.39 MIL/uL (ref 3.87–5.11)
WBC: 8.1 10*3/uL (ref 4.0–10.5)

## 2011-02-13 NOTE — Group Therapy Note (Signed)
  NAME:  Kristina [name], Kristina [name]              ACCOUNT NO.:  [id number]  MEDICAL RECORD NO.:  [id number]           PATIENT TYPE:  I  LOCATION:  A206                          FACILITY:  [facility]  PHYSICIAN:  Wilson Singer, M.D.DATE OF BIRTH:  [birth year]  DATE OF PROCEDURE:  02/13/2011 DATE OF DISCHARGE:                                PROGRESS NOTE   HISTORY:  This lady feels about the same as she did yesterday.  She gets short of breath on exertion.  She is wheezing a little to some degree. She was admitted with pneumonia and exacerbating her asthma.  She has never been a smoker, but she has been around smoke.  She also was diagnosed with sleep apnea in 2009, but I think there has not been any significant followup.  She also has been seen by Cardiology in 2009 and had echocardiogram and stress test, which showed normal echocardiogram and no evidence of ischemia.  She also has a history of hypertension and diabetes, which apparently has been diet controlled.PHYSICAL EXAMINATION:  VITAL SIGNS:  Today, temperature 98.2, blood pressure 122/80, pulse 95, saturation 92% on 2 liters oxygen. GENERAL:  She looks systemically well and does not appear to be in any acute distress.  She is morbidly obese.  Her weight recorded was 135.8 kg. HEART:  Sounds are present and normal without gallop rhythm. LUNG:  Fields are clear with some scattered wheezing throughout both lung fields and crackles in the right mid and lower zones.  INVESTIGATIONS:  Hemoglobin 12.5, white blood cell count 8.1, platelets 220.  Sodium 139, potassium 3.7, bicarbonate 26, BUN 14, creatinine 0.56, blood glucose 178 on steroids.  Pro-BNP is only 55.  IMPRESSION: 1. Right pneumonia. 2. Asthma. 3. Morbid obesity. 4. Hypertension. 5. Diabetes with hemoglobin A1c of 6.2%.  PLAN: 1. Continue with current treatment, and I will discontinue IV steroids     now and start on prednisone orally.  I think she will need     outpatient  management in terms of weight loss and probably a repeat     sleep study as well as pulmonary function test once her pneumonia     clears up.     Wilson Singer, M.D.     NCG/MEDQ  D:  02/13/2011  T:  02/13/2011  Job:  [id number]  Electronically Signed by Lilly Cove M.D. on 02/13/2011 12:33:35 PM

## 2011-02-14 ENCOUNTER — Encounter (HOSPITAL_COMMUNITY): Payer: Self-pay | Admitting: Radiology

## 2011-02-14 ENCOUNTER — Inpatient Hospital Stay (HOSPITAL_COMMUNITY): Payer: Medicaid Other

## 2011-02-14 LAB — BASIC METABOLIC PANEL
BUN: 15 mg/dL (ref 6–23)
CO2: 30 mEq/L (ref 19–32)
Chloride: 98 mEq/L (ref 96–112)
Creatinine, Ser: 0.55 mg/dL (ref 0.4–1.2)
GFR calc Af Amer: >60 mL/min (ref >60)

## 2011-02-14 LAB — DIFFERENTIAL
Basophils Relative: 0 % (ref 0–1)
Monocytes Absolute: 0.7 10*3/uL (ref 0.1–1.0)
Monocytes Relative: 7 % (ref 3–12)
Neutro Abs: 7.2 10*3/uL (ref 1.7–7.7)

## 2011-02-14 LAB — GLUCOSE, CAPILLARY
Glucose-Capillary: 236 mg/dL — ABNORMAL HIGH (ref 70–99)
Glucose-Capillary: 155 mg/dL — ABNORMAL HIGH (ref 70–99)

## 2011-02-14 LAB — CBC
HCT: 36.7 % (ref 36.0–46.0)
Hemoglobin: 11.8 g/dL — ABNORMAL LOW (ref 12.0–15.0)
MCV: 88.4 fL (ref 78.0–100.0)
RDW: 13.5 % (ref 11.5–15.5)
WBC: 10.7 10*3/uL — ABNORMAL HIGH (ref 4.0–10.5)

## 2011-02-14 MED ORDER — IOHEXOL 350 MG/ML SOLN
100.0000 mL | Freq: Once | INTRAVENOUS | Status: AC | PRN
  Administered 2011-02-14: 100 mL via INTRAVENOUS

## 2011-02-15 NOTE — Discharge Summary (Signed)
Kristina [name], Kristina [name]              ACCOUNT NO.:  [id number]  MEDICAL RECORD NO.:  [id number]           PATIENT TYPE:  I  LOCATION:  A304                          FACILITY:  [facility]  PHYSICIAN:  Wilson Singer, M.D.DATE OF BIRTH:  02-01-1971  DATE OF ADMISSION:  02/11/2011 DATE OF DISCHARGE:  06/01/2012LH                              DISCHARGE SUMMARY   FINAL DISCHARGE DIAGNOSES: 1. Bilateral pneumonia. 2. Asthma. 3. Morbid obesity with BMI of 52. 4. Diabetes.  Hemoglobin A1c 6.2%. 5. Hypertension.  CONDITION ON DISCHARGE:  Stable.  MEDICATIONS ON DISCHARGE:  Advair inhaler 250/51 twice a day, HCTZ 12.5 mg daily, Avelox 400 mg daily for one further week, prednisone reducing course over the next 9 days, ibuprofen 200 mg two tablets p.r.n., omeprazole 40 mg daily, Robitussin-DM 2 teaspoons as needed, Tylenol 500 mg 2 tablets as needed, maximum of 8 tablets a day.  CONDITION ON DISCHARGE:  Stable.  HISTORY:  This 40 year old lady came in with dyspnea.  Please see initial history and physical examination done by Dr. Tarry Kos.  HOSPITAL PROGRESS:  The patient was put on intravenous steroids and antibiotics in view of her pneumonia.  She did well, but was slow to progress.  Today, she feels much improved.  She was concerned about what else might be going on in her lung in the CTs.  Chest angiogram was done yesterday to make sure she did not have a pulmonary embolism.  The CT angiogram of the chest showed scattered patchy bilateral pulmonary infiltrates consistent with pneumonia, but no evidence of pulmonary embolism.  She has seen a nutritionist while in the hospital and she has been given advice about diet and exercise.  I have personally discussed with her the same and the need to lose weight.  She does have history of obstructive sleep apnea.  She had a sleep study in 2009, but I believe she needs a repeat study now.  In any case, main form of treatment will be weight  loss initially.  Today, she looks well and feels improved.  PHYSICAL EXAMINATION:  Vital Signs:  Temperature 98.3, blood pressure 114/77, pulse 85, saturation 95% on 2 L oxygen.  Heart:  Sounds are present and normal without murmurs.  Lung:  Fields are actually entirely clear without any wheezing.  Investigations yesterday showed a hemoglobin of 11.8, white blood cell count 10.7, platelets of 227. Sodium 136, potassium 3.4, which had been repleted yesterday.  Carbon dioxide 30, BUN 15, creatinine 0.55.  A Pro BNP had been done couple of days ago and this was in the normal range.  DISPOSITION:  The patient is stable to be discharged home on one further week of antibiotic with Avelox, Advair inhaler, and reducing taper of prednisone.  In addition, we have added HCTZ for hypertension. Obviously, I think if she loses significant weight, then all her problems will likely improve including her hemoglobin A1c at 6.2%.  She will follow up with her primary care physician, but also I want her to see pulmonologist, Dr. Juanetta Gosling for her management of asthma on an ongoing basis.     Kristina [name] Kristina [name],  M.D.     Toy Baker  D:  02/15/2011  T:  02/15/2011  Job:  [id number]  cc:   Catalina Pizza, M.D. Fax: [phone]  Oneal Deputy. Juanetta Gosling, M.D. Fax: [phone]  Electronically Signed by Lilly Cove M.D. on 02/15/2011 10:20:43 AM

## 2011-02-15 NOTE — Group Therapy Note (Signed)
  NAMEKALLIE, DEPOLO              ACCOUNT NO.:  [id number]  MEDICAL RECORD NO.:  [id number]           PATIENT TYPE:  LOCATION:                                 FACILITY:  PHYSICIAN:  Wilson Singer, M.D.DATE OF BIRTH:  [birth year]  DATE OF PROCEDURE:  02/14/2011 DATE OF DISCHARGE:                                PROGRESS NOTE   This lady does not appear to be getting better symptomatically although her vital signs are very stable.  She says she has been wheezing and feels like she might have pleuritic chest pain.  Chest x-ray yesterday showed patchy bilateral airspace disease which was less evident from the previous x-ray.  PHYSICAL EXAMINATION:  VITAL SIGNS:  Temperature 98.4, blood pressure 130/76, pulse 90, saturation 93% on 2 liters of oxygen. CHEST:  Lung fields show very occasional wheezing.  I do not hear crackles today like I did yesterday on the right lung.  INVESTIGATIONS:  Hemoglobin 11.8, white blood cell count 10.7, platelets 227.  Sodium 136, potassium 3.4, bicarbonate 30, BUN 15, creatinine 0.55.  IMPRESSION: 1. Right-sided pneumonia. 2. Asthma. 3. Morbid obesity. 4. Hypertension. 5. Diabetes. 6. Pleuritic chest pain.  PLAN: 1. Start Advair inhaler to see if this will help in addition to     systemic steroids. 2. CT angiogram of the chest to make sure this lady does not have a     pulmonary embolism.  Her symptoms are more severe and seem to be     out of proportion than her objective examination.     Wilson Singer, M.D.     NCG/MEDQ  D:  02/14/2011  T:  02/14/2011  Job:  [id number]  Electronically Signed by Lilly Cove M.D. on 02/15/2011 10:19:53 AM

## 2011-02-15 NOTE — H&P (Signed)
Kristina [name], Kristina [name]              ACCOUNT NO.:  [id number]  MEDICAL RECORD NO.:  [id number]           PATIENT TYPE:  I  LOCATION:  A206                          FACILITY:  [facility]  PHYSICIAN:  Tarry Kos, MD       DATE OF BIRTH:  June 06, 1971  DATE OF ADMISSION:  02/11/2011 DATE OF DISCHARGE:  LH                             HISTORY & PHYSICAL   CHIEF COMPLAINT:  Shortness of breath.  PRIMARY CARE PHYSICIAN:  Dr. Margo Aye.  HISTORY OF PRESENT ILLNESS:  [name] is a 40 year old female with a history of asthma, diabetes, hyperlipidemia, hypertension who presents to emergency department with 3-day history of cough, progressive wheezing, and upper respiratory tract infection.  She does have albuterol inhaler at home, but has not been using it.  She has progressively gotten worse.  Finally, came to the emergency department. She has been feeling under the weather.  She denies any fevers, but has been having a productive cough..  She denies any nausea, vomiting, or diarrhea.  Denies any chest pain, abdominal pain, said she has been in the emergency department.  She has received frequent nebulizer treatments and steroids, and she says she is feeling much better.  REVIEW OF SYSTEMS:  Otherwise negative.  PAST MEDICAL HISTORY:  GERD, obesity, hypertension, type 2 diabetic, asthma, hyperlipidemia.  SOCIAL HISTORY:  She is a nonsmoker.  No alcohol.  No IV drug abuse.  MEDICATIONS:  She is not sure.  ALLERGIES:  SULFA causes swelling in the throat, AMPICILLIN causes swelling in the throat, and CODEINE causes a itch.  PHYSICAL EXAMINATION:  VITAL SIGNS:  Temperature is 98.7, blood pressure initially is 190/86, however, most recent is 102/37, pulse is 115, respirations 20, 94% O2 sats on 2 liters nasal cannula. GENERAL:  She is alert and oriented x4, in no apparent distress, cooperative and friendly. HEENT:  Extraocular muscles intact.  Pupils equal and reactive to light. Oropharynx  clear.  Mucous membranes are moist. NECK:  No JVD.  No carotid bruits: CARDIOVASCULAR:  Regular rate and rhythm without murmurs, rubs, or gallops. CHEST:  She is diminished at the bases, but clear.  No wheezes, rhonchi, or rales bilaterally with good air movement. ABDOMEN:  Soft, nontender, and nondistended.  Positive bowel sounds.  No hepatosplenomegaly.  Very obese. EXTREMITIES:  No clubbing, cyanosis, or edema. SKIN:  No rashes. PSYCH:  Normal affect. NEURO:  No focal neurologic deficits.  LABORATORY DATA:  Initially in the emergency department, only an ABG was done, which revealed a pH of 7.405, pCO2 of 37, pO2 of 62.2 that was on 2 liters nasal cannula.  I then subsequently asked for chest x-ray and basic labs to be done that has finally come back.  Her chest x-ray does show an infiltrate at which time the emergency department gave her Avelox.  Her white count is normal.  Hemoglobin is normal.  Basic metabolic panel is essentially normal with exception of a glucose of 172.  ASSESSMENT/PLAN:  This is a 40 year old female with asthma exacerbation. 1. Asthma exacerbation, placed her on steroids, frequent nebulizer     treatments. 2. Community-acquired pneumonia, placed  her on Avelox. 3. Acute hypoxic respiratory failure secondary to the above issues. 4. Diabetes.  We will check hemoglobin A1c and monitor sugars. 5. Hypertension.  This is stable at this time.  We will monitor. 6. Clarify home medication regimen. 7. The patient is full code.  Further recommendation depending on overall hospital course.                                           ______________________________ Tarry Kos, MD     RD/MEDQ  D:  02/12/2011  T:  02/12/2011  Job:  [id number]  Electronically Signed by Tarry Kos MD on 02/15/2011 11:53:27 AM

## 2011-03-06 ENCOUNTER — Ambulatory Visit (HOSPITAL_COMMUNITY)
Admission: RE | Admit: 2011-03-06 | Discharge: 2011-03-06 | Disposition: A | Discharge status: Home / Self Care | Payer: Medicaid Other | Source: Ambulatory Visit | Attending: Internal Medicine | Admitting: Internal Medicine

## 2011-03-06 DIAGNOSIS — R0989 Other specified symptoms and signs involving the circulatory and respiratory systems: Secondary | ICD-10-CM | POA: Insufficient documentation

## 2011-03-06 DIAGNOSIS — R0609 Other forms of dyspnea: Secondary | ICD-10-CM | POA: Insufficient documentation

## 2011-03-06 LAB — PULMONARY FUNCTION TEST

## 2011-03-07 ENCOUNTER — Encounter (HOSPITAL_COMMUNITY): Payer: Self-pay

## 2011-04-09 ENCOUNTER — Ambulatory Visit (HOSPITAL_COMMUNITY)
Admission: RE | Admit: 2011-04-09 | Discharge: 2011-04-09 | Disposition: A | Discharge status: Home / Self Care | Payer: Medicaid Other | Source: Ambulatory Visit | Attending: Internal Medicine | Admitting: Internal Medicine

## 2011-04-09 ENCOUNTER — Other Ambulatory Visit (HOSPITAL_COMMUNITY): Payer: Self-pay | Admitting: Internal Medicine

## 2011-04-09 DIAGNOSIS — R0602 Shortness of breath: Secondary | ICD-10-CM

## 2011-06-12 LAB — POCT CARDIAC MARKERS
CKMB, poc: <1 — ABNORMAL LOW
Myoglobin, poc: 25.3
Operator id: 264761
Troponin i, poc: <0.05

## 2011-06-12 LAB — DIFFERENTIAL
Basophils Relative: 1
Eosinophils Absolute: 0.2
Eosinophils Absolute: 0.2
Eosinophils Relative: 2
Lymphocytes Relative: 31
Lymphs Abs: 2.3
Lymphs Abs: 2.5
Monocytes Absolute: 0.7
Monocytes Relative: 9
Neutro Abs: 4.5
Neutrophils Relative %: 58

## 2011-06-12 LAB — CBC
HCT: 33.9 — ABNORMAL LOW
HCT: 36.6
Hemoglobin: 11.8 — ABNORMAL LOW
Hemoglobin: 12.7
MCHC: 34.7
MCHC: 34.7
MCV: 83.2
MCV: 84.4
Platelets: 195
Platelets: 218
RBC: 4.01
RBC: 4.4
RDW: 13.6
RDW: 13.9
WBC: 7.7
WBC: 8.2

## 2011-06-12 LAB — BASIC METABOLIC PANEL
Chloride: 107
Chloride: 108
Creatinine, Ser: 0.79
GFR calc Af Amer: >60
GFR calc non Af Amer: >60
Potassium: 3.9
Potassium: 4.1
Sodium: 139
Sodium: 143

## 2011-06-12 LAB — CARDIAC PANEL(CRET KIN+CKTOT+MB+TROPI)
CK, MB: 0.6
Relative Index: INVALID
Relative Index: INVALID
Total CK: 56
Troponin I: 0.01
Troponin I: 0.01

## 2011-06-12 LAB — LIPID PANEL
Cholesterol: 152
LDL Cholesterol: 93
Total CHOL/HDL Ratio: 4.5

## 2011-06-12 LAB — D-DIMER, QUANTITATIVE: D-Dimer, Quant: 0.22

## 2011-06-27 LAB — POCT CARDIAC MARKERS
CKMB, poc: <1 — ABNORMAL LOW
CKMB, poc: <1 — ABNORMAL LOW
CKMB, poc: <1 — ABNORMAL LOW
Myoglobin, poc: 44
Myoglobin, poc: 46.4
Myoglobin, poc: 58.7
Operator id: 4001
Operator id: 4001
Operator id: 4074
Troponin i, poc: <0.05
Troponin i, poc: <0.05
Troponin i, poc: <0.05

## 2011-06-27 LAB — DIFFERENTIAL
Basophils Absolute: 0.1
Basophils Relative: 1
Eosinophils Absolute: 0.1
Eosinophils Relative: 2
Lymphocytes Relative: 38
Lymphs Abs: 2.6
Monocytes Absolute: 0.7
Monocytes Relative: 9
Neutro Abs: 3.5
Neutrophils Relative %: 50

## 2011-06-27 LAB — URINALYSIS, ROUTINE W REFLEX MICROSCOPIC
Bilirubin Urine: NEGATIVE
Glucose, UA: NEGATIVE
Hgb urine dipstick: NEGATIVE
Ketones, ur: NEGATIVE
Nitrite: NEGATIVE
Protein, ur: NEGATIVE
Specific Gravity, Urine: 1.044 — ABNORMAL HIGH
Urobilinogen, UA: 0.2
pH: 5.5

## 2011-06-27 LAB — CBC
HCT: 37.2
Hemoglobin: 12.5
MCHC: 33.7
MCV: 82.1
Platelets: 224
RBC: 4.53
RDW: 14.7 — ABNORMAL HIGH
WBC: 6.9

## 2011-06-27 LAB — BASIC METABOLIC PANEL
BUN: 8
CO2: 25
Calcium: 9.3
Chloride: 109
Creatinine, Ser: 0.72
GFR calc Af Amer: >60
GFR calc non Af Amer: >60
Glucose, Bld: 94
Potassium: 3.7
Sodium: 140

## 2011-06-27 LAB — D-DIMER, QUANTITATIVE (NOT AT ARMC): D-Dimer, Quant: 0.26

## 2011-07-26 ENCOUNTER — Ambulatory Visit: Payer: BC Managed Care – PPO | Attending: Internal Medicine | Admitting: Sleep Medicine

## 2011-07-26 DIAGNOSIS — Z6841 Body Mass Index (BMI) 40.0 and over, adult: Secondary | ICD-10-CM | POA: Insufficient documentation

## 2011-07-26 DIAGNOSIS — G4733 Obstructive sleep apnea (adult) (pediatric): Secondary | ICD-10-CM | POA: Insufficient documentation

## 2011-07-26 DIAGNOSIS — G4730 Sleep apnea, unspecified: Secondary | ICD-10-CM

## 2011-08-01 NOTE — Procedures (Signed)
NAME:  Kristina [name], Kristina [name]              ACCOUNT NO.:  [id number]  MEDICAL RECORD NO.:  [id number]          PATIENT TYPE:  OUT  LOCATION:  SLEEP LAB                     FACILITY:  [facility]  PHYSICIAN:  Daianna Vasques A. Gerilyn Pilgrim, M.D. DATE OF BIRTH:  10-Sep-1971  DATE OF STUDY:  07/26/2011                           NOCTURNAL POLYSOMNOGRAM  REFERRING PHYSICIAN:  ZACK HALL  INDICATION:  A 40 year old who presents with hypersomnia, fatigue, and snoring.  The study is being done to evaluate for obstructive sleep apnea syndrome.  EPWORTH SLEEPINESS SCORE:  13.  BMI:  50.  ARCHITECTURAL SUMMARY:  This is a split night recording with the initial portion being a diagnostic study and the second portion a titration recording.  The total recording time is 466 minutes.  Sleep efficiency 83%.  Sleep latency 4.5 minutes.  REM latency 78 minutes.  RESPIRATORY SUMMARY:  Baseline oxygen saturation 93, lowest saturation 84 during REM sleep.  Diagnostic AHI is 13.  The patient was placed on positive pressure and titrated between pressures of 5 and 8.  Optimal pressure is 8 with a resolution of obstructive events.  The patient's events occurred during REM sleep.  The REM AHI on the diagnostic portion is 21.  LIMB MOVEMENT SUMMARY:  PLM index 0.  ELECTROCARDIOGRAM SUMMARY:  Average heart rate is 87 with no significant dysrhythmias observed.  IMPRESSION:  Mild obstructive sleep apnea syndrome which responds well to a CPAP of 8.  Thanks for this referral.   MOVEMENT-PARASOMNIA:  IMPRESSIONS-RECOMMENDATIONS:     Juell Radney A. Gerilyn Pilgrim, M.D.    KAD/MEDQ  D:  08/01/2011 16:10:96  T:  08/01/2011 09:45:49  Job:  [id number]

## 2011-08-25 ENCOUNTER — Emergency Department (HOSPITAL_COMMUNITY): Payer: BC Managed Care – PPO

## 2011-08-25 ENCOUNTER — Encounter (HOSPITAL_COMMUNITY): Payer: Self-pay | Admitting: *Deleted

## 2011-08-25 ENCOUNTER — Emergency Department (HOSPITAL_COMMUNITY)
Admission: EM | Admit: 2011-08-25 | Discharge: 2011-08-25 | Discharge status: Against Medical Advice | Payer: BC Managed Care – PPO | Attending: Emergency Medicine | Admitting: Emergency Medicine

## 2011-08-25 DIAGNOSIS — R059 Cough, unspecified: Secondary | ICD-10-CM | POA: Insufficient documentation

## 2011-08-25 DIAGNOSIS — R0602 Shortness of breath: Secondary | ICD-10-CM | POA: Insufficient documentation

## 2011-08-25 DIAGNOSIS — I10 Essential (primary) hypertension: Secondary | ICD-10-CM | POA: Insufficient documentation

## 2011-08-25 DIAGNOSIS — R05 Cough: Secondary | ICD-10-CM

## 2011-08-25 DIAGNOSIS — E119 Type 2 diabetes mellitus without complications: Secondary | ICD-10-CM | POA: Insufficient documentation

## 2011-08-25 DIAGNOSIS — J45909 Unspecified asthma, uncomplicated: Secondary | ICD-10-CM | POA: Insufficient documentation

## 2011-08-25 DIAGNOSIS — R110 Nausea: Secondary | ICD-10-CM

## 2011-08-25 DIAGNOSIS — R0789 Other chest pain: Secondary | ICD-10-CM | POA: Insufficient documentation

## 2011-08-25 NOTE — ED Provider Notes (Signed)
History   This chart was scribed for Kristina Jakes, MD  by Magnus Sinning. The patient was seen in room APA14/AP[facility]14   CSN: [id number] Arrival date & time: 08/25/2011  7:26 AM   First MD Initiated Contact with Patient 08/25/11 [phone]      Chief Complaint  Patient presents with  . Nausea    (Consider location/radiation/quality/duration/timing/severity/associated sxs/prior treatment) HPI 8:31 AM: Kristina [name] is a 40 y.o. female who presents to the Emergency Department complaining 1 day of nausea with associated hyperglycemia and malaise.  Pt (w/ a h/o diet controlled DM) woke up and checked her blood sugar and found it was elevated to 187. Pt says her blood sugar may be elevated b/c she started taking prednisone and levofloxacin 08/21/2011 for bronchitis and asthma exacerbation. Pt also c/o 2 weeks of chest tightness, dry cough, wheezing and SOB. Wheezing and SOB are improved since started abx and prednisone. Pt denies rash, HA, dysuria, v/d, ST, redness to eyes, neck pain, or back pain above baseline.    Past Medical History  Diagnosis Date  . Hypertension   . Diabetes mellitus   . Asthma   . COPD (chronic obstructive pulmonary disease)     Past Surgical History  Procedure Date  . Abdominal hysterectomy   . Cholecystectomy   . Knee surgery     right    History reviewed. No pertinent family history.  History  Substance Use Topics  . Smoking status: Never Smoker   . Smokeless tobacco: Not on file  . Alcohol Use: No    Review of Systems  Constitutional: Negative for fever.  HENT: Negative for neck pain and neck stiffness.   Respiratory: Positive for cough, chest tightness, shortness of breath and wheezing.   Gastrointestinal: Positive for nausea. Negative for vomiting, diarrhea and constipation.  Genitourinary: Negative for dysuria.  Musculoskeletal: Negative for back pain.  Skin: Negative for rash.  Neurological: Negative for headaches.  All other systems reviewed  and are negative.    Allergies  Ampicillin; Codeine; and Sulfonamide derivatives  Home Medications   Current Outpatient Rx  Name Route Sig Dispense Refill  . OLMESARTAN MEDOXOMIL 40 MG PO TABS Oral Take 40 mg by mouth daily.      [name] OMEPRAZOLE 40 MG PO CPDR Oral Take 40 mg by mouth daily.        BP 129/76  Pulse 97  Temp(Src) 97.4 F (36.3 C) (Oral)  Resp 20  Ht 5\' 2"  (1.575 m)  Wt 295 lb (133.811 kg)  BMI 53.96 kg/m2  SpO2 98%  Physical Exam  Nursing note and vitals reviewed. Constitutional: She is oriented to person, place, and time. She appears well-developed and well-nourished. No distress.  HENT:  Head: Normocephalic and atraumatic.  Mouth/Throat: Oropharynx is clear and moist. No oropharyngeal exudate.  Eyes: Conjunctivae are normal. Pupils are equal, round, and reactive to light.  Neck: Normal range of motion. Neck supple.  Cardiovascular: Normal rate, regular rhythm and normal heart sounds.   Pulmonary/Chest: Effort normal and breath sounds normal.  Abdominal: Soft. Bowel sounds are normal. There is no tenderness.  Musculoskeletal: She exhibits no edema (no pitting edema).  Neurological: She is oriented to person, place, and time. No cranial nerve deficit. Coordination normal.  Skin: Skin is warm. No rash noted.  Psychiatric: She has a normal mood and affect.    ED Course  Procedures (including critical care time) DIAGNOSTIC STUDIES: Oxygen Saturation is 98% on room air, normal by my interpretation.  COORDINATION OF CARE:   Labs Reviewed  GLUCOSE, CAPILLARY - Abnormal; Notable for the following:    Glucose-Capillary 122 (*)    All other components within normal limits    1. Nausea   2. Cough       MDM   Patient treated for bronchitis by the primary care provider with prednisone and antibiotic on Wednesday. Patient presented today with a complaint of cough nausea and concern for elevated blood sugar blood sugar was 187 at home 120 something here  in the emergency department. Patient reassured that blood sugar below 200 the only concerning particularly in the face of taking prednisone. Ordered chest x-ray to rule out pneumonia since persistent cough. Patient left AMA without any notification to staff at all.   I personally performed the services described in this documentation, which was scribed in my presence. The recorded information has been reviewed and considered.        Kristina Jakes, MD 08/25/11 [phone]

## 2011-08-25 NOTE — ED Notes (Signed)
Pt requested medication for nausea.  Looked for EDP and he was not in Physician's office.  Looked for EDP on the unit and he was with another pt.  Pt upset and stated she has phenergan at home that she can take for nausea.  Pt and family member left without telling anyone.  Pt was walking down hallway and refused to stop to sign AMA.

## 2011-08-25 NOTE — ED Notes (Signed)
Pt reports CBG was 186 at approx 6:30 am this morning.  Pt reports she thinks her blood sugar is high and causing her to feel "strange."  Pt reports feeling "flushed" and "foggy."  Pt states she is having some nausea.  Pt denies vomiting and denies diarrhea.  Pt is alert and oriented x 4 with respirations even and unlabored.  NAD at this time.  Bedside cardiac monitor is on.

## 2011-08-25 NOTE — ED Notes (Signed)
NAD at this time.  Pt ambulatory with steady gait.  Respirations even and unlabored.

## 2011-08-25 NOTE — ED Notes (Addendum)
Pt c/o nausea and feeling bad. States that she is on antibiotics and prednisone for bronchitis and feels like her blood sugar may be up. Pt is not on meds for diabetes. Pt also c/o coughing and tightness in her chest since last night.

## 2011-09-13 ENCOUNTER — Encounter (HOSPITAL_COMMUNITY): Payer: Self-pay | Admitting: Emergency Medicine

## 2011-09-13 ENCOUNTER — Emergency Department (HOSPITAL_COMMUNITY)
Admission: EM | Admit: 2011-09-13 | Discharge: 2011-09-13 | Disposition: A | Discharge status: Home / Self Care | Payer: BC Managed Care – PPO | Attending: Emergency Medicine | Admitting: Emergency Medicine

## 2011-09-13 DIAGNOSIS — I10 Essential (primary) hypertension: Secondary | ICD-10-CM | POA: Insufficient documentation

## 2011-09-13 DIAGNOSIS — M545 Low back pain, unspecified: Secondary | ICD-10-CM | POA: Insufficient documentation

## 2011-09-13 DIAGNOSIS — E119 Type 2 diabetes mellitus without complications: Secondary | ICD-10-CM | POA: Insufficient documentation

## 2011-09-13 DIAGNOSIS — J449 Chronic obstructive pulmonary disease, unspecified: Secondary | ICD-10-CM | POA: Insufficient documentation

## 2011-09-13 DIAGNOSIS — X500XXA Overexertion from strenuous movement or load, initial encounter: Secondary | ICD-10-CM | POA: Insufficient documentation

## 2011-09-13 DIAGNOSIS — Z79899 Other long term (current) drug therapy: Secondary | ICD-10-CM | POA: Insufficient documentation

## 2011-09-13 DIAGNOSIS — J4489 Other specified chronic obstructive pulmonary disease: Secondary | ICD-10-CM | POA: Insufficient documentation

## 2011-09-13 DIAGNOSIS — S335XXA Sprain of ligaments of lumbar spine, initial encounter: Secondary | ICD-10-CM | POA: Insufficient documentation

## 2011-09-13 DIAGNOSIS — S39012A Strain of muscle, fascia and tendon of lower back, initial encounter: Secondary | ICD-10-CM

## 2011-09-13 MED ORDER — CYCLOBENZAPRINE HCL 10 MG PO TABS
10.0000 mg | ORAL_TABLET | Freq: Once | ORAL | Status: AC
  Administered 2011-09-13: 10 mg via ORAL
  Filled 2011-09-13 (×2): qty 1

## 2011-09-13 MED ORDER — PREDNISONE 20 MG PO TABS
60.0000 mg | ORAL_TABLET | Freq: Once | ORAL | Status: AC
  Administered 2011-09-13: 60 mg via ORAL
  Filled 2011-09-13: qty 3

## 2011-09-13 MED ORDER — HYDROCODONE-ACETAMINOPHEN 5-325 MG PO TABS: 1.0000 | ORAL_TABLET | Freq: Four times a day (QID) | ORAL | Status: AC | PRN

## 2011-09-13 MED ORDER — PREDNISONE 50 MG PO TABS: 50.0000 mg | ORAL_TABLET | Freq: Every day | ORAL | Status: DC

## 2011-09-13 MED ORDER — CYCLOBENZAPRINE HCL 10 MG PO TABS: ORAL_TABLET | ORAL | Status: DC

## 2011-09-13 MED ORDER — HYDROMORPHONE HCL PF 1 MG/ML IJ SOLN
1.0000 mg | Freq: Once | INTRAMUSCULAR | Status: AC
  Administered 2011-09-13: 1 mg via INTRAMUSCULAR
  Filled 2011-09-13: qty 1

## 2011-09-13 MED ORDER — HYDROCODONE-ACETAMINOPHEN 5-325 MG PO TABS
1.0000 | ORAL_TABLET | Freq: Once | ORAL | Status: DC
  Filled 2011-09-13: qty 1

## 2011-09-13 NOTE — ED Provider Notes (Signed)
History     CSN: [id number]  Arrival date & time 09/13/11  4098   First MD Initiated Contact with Patient 09/13/11 1047      Chief Complaint  Patient presents with  . Back Pain    (Consider location/radiation/quality/duration/timing/severity/associated sxs/prior treatment) HPI Comments: Pt has known DDD.  She has been lifting furniture and her daughter fell in the shower and she picked her up.  She has had a "flare-up" the past few days.  To radicular pain.  The history is provided by the patient. No language interpreter was used.    Past Medical History  Diagnosis Date  . Hypertension   . Diabetes mellitus   . Asthma   . COPD (chronic obstructive pulmonary disease)     Past Surgical History  Procedure Date  . Abdominal hysterectomy   . Cholecystectomy   . Knee surgery     right    History reviewed. No pertinent family history.  History  Substance Use Topics  . Smoking status: Never Smoker   . Smokeless tobacco: Not on file  . Alcohol Use: No    OB History    Grav Para Term Preterm Abortions TAB SAB Ect Mult Living                  Review of Systems  Musculoskeletal: Positive for back pain.  All other systems reviewed and are negative.    Allergies  Ampicillin; Codeine; and Sulfonamide derivatives  Home Medications   Current Outpatient Rx  Name Route Sig Dispense Refill  . ALBUTEROL SULFATE HFA 108 (90 BASE) MCG/ACT IN AERS Inhalation Inhale 2 puffs into the lungs every 6 (six) hours as needed. For asthma     . IBUPROFEN 200 MG PO TABS Oral Take 800 mg by mouth every 6 (six) hours as needed. For backpain     . MOMETASONE FURO-FORMOTEROL FUM 100-5 MCG/ACT IN AERO Inhalation Inhale 2 puffs into the lungs 2 (two) times daily.      [name] OLMESARTAN MEDOXOMIL 40 MG PO TABS Oral Take 40 mg by mouth daily.      [name] OMEPRAZOLE 40 MG PO CPDR Oral Take 40 mg by mouth daily.        BP 120/76  Pulse 102  Temp 98.3 F (36.8 C)  Resp 20  Ht 5\' 2"  (1.575 m)   Wt 295 lb (133.811 kg)  BMI 53.96 kg/m2  SpO2 99%  Physical Exam  Nursing note and vitals reviewed. Constitutional: She is oriented to person, place, and time. She appears well-developed and well-nourished. No distress.  HENT:  Head: Normocephalic and atraumatic.  Eyes: EOM are normal.  Neck: Normal range of motion.  Cardiovascular: Normal rate, regular rhythm and normal heart sounds.   Pulmonary/Chest: Effort normal and breath sounds normal.  Abdominal: Soft. She exhibits no distension. There is no tenderness.  Musculoskeletal: She exhibits tenderness.       Arms: Neurological: She is alert and oriented to person, place, and time. She has normal strength. No cranial nerve deficit or sensory deficit. GCS eye subscore is 4. GCS verbal subscore is 5. GCS motor subscore is 6.  Reflex Scores:      Patellar reflexes are 2+ on the right side and 2+ on the left side.      Achilles reflexes are 2+ on the right side and 2+ on the left side. Skin: Skin is warm and dry.  Psychiatric: She has a normal mood and affect. Judgment normal.    ED  Course  Procedures (including critical care time)  Labs Reviewed - No data to display No results found.   No diagnosis found.    MDM          Worthy Rancher, PA 09/13/11 [phone]

## 2011-09-13 NOTE — ED Notes (Signed)
Pt c/o exacerbation of chronic lower back pain.

## 2011-09-13 NOTE — ED Notes (Signed)
Pt c/o lower back pain and muscle spasms x 2 days. Denies injury. Pt alert and oriented x 3. Skin warm and dry. Color pink.

## 2011-09-14 NOTE — ED Provider Notes (Signed)
Medical screening examination/treatment/procedure(s) were performed by non-physician practitioner and as supervising physician I was immediately available for consultation/collaboration.  Joycelynn Fritsche, MD 09/14/11 1313 

## 2011-09-23 ENCOUNTER — Other Ambulatory Visit (HOSPITAL_COMMUNITY): Payer: Self-pay | Admitting: Internal Medicine

## 2011-09-23 DIAGNOSIS — R52 Pain, unspecified: Secondary | ICD-10-CM

## 2011-09-24 ENCOUNTER — Ambulatory Visit (HOSPITAL_COMMUNITY)
Admission: RE | Admit: 2011-09-24 | Discharge: 2011-09-24 | Disposition: A | Discharge status: Home / Self Care | Payer: BC Managed Care – PPO | Source: Ambulatory Visit | Attending: Internal Medicine | Admitting: Internal Medicine

## 2011-09-24 DIAGNOSIS — M5126 Other intervertebral disc displacement, lumbar region: Secondary | ICD-10-CM | POA: Insufficient documentation

## 2011-09-24 DIAGNOSIS — M545 Low back pain, unspecified: Secondary | ICD-10-CM | POA: Insufficient documentation

## 2011-09-24 DIAGNOSIS — R52 Pain, unspecified: Secondary | ICD-10-CM

## 2012-01-10 ENCOUNTER — Emergency Department (HOSPITAL_COMMUNITY)
Admission: EM | Admit: 2012-01-10 | Discharge: 2012-01-10 | Disposition: A | Discharge status: Home / Self Care | Payer: Managed Care, Other (non HMO) | Attending: Emergency Medicine | Admitting: Emergency Medicine

## 2012-01-10 ENCOUNTER — Emergency Department (HOSPITAL_COMMUNITY): Payer: Managed Care, Other (non HMO)

## 2012-01-10 ENCOUNTER — Encounter (HOSPITAL_COMMUNITY): Payer: Self-pay | Admitting: *Deleted

## 2012-01-10 DIAGNOSIS — J449 Chronic obstructive pulmonary disease, unspecified: Secondary | ICD-10-CM | POA: Insufficient documentation

## 2012-01-10 DIAGNOSIS — E079 Disorder of thyroid, unspecified: Secondary | ICD-10-CM | POA: Insufficient documentation

## 2012-01-10 DIAGNOSIS — I10 Essential (primary) hypertension: Secondary | ICD-10-CM | POA: Insufficient documentation

## 2012-01-10 DIAGNOSIS — F411 Generalized anxiety disorder: Secondary | ICD-10-CM | POA: Insufficient documentation

## 2012-01-10 DIAGNOSIS — M79606 Pain in leg, unspecified: Secondary | ICD-10-CM

## 2012-01-10 DIAGNOSIS — Z9889 Other specified postprocedural states: Secondary | ICD-10-CM | POA: Insufficient documentation

## 2012-01-10 DIAGNOSIS — Z79899 Other long term (current) drug therapy: Secondary | ICD-10-CM | POA: Insufficient documentation

## 2012-01-10 DIAGNOSIS — E119 Type 2 diabetes mellitus without complications: Secondary | ICD-10-CM | POA: Insufficient documentation

## 2012-01-10 DIAGNOSIS — J4489 Other specified chronic obstructive pulmonary disease: Secondary | ICD-10-CM | POA: Insufficient documentation

## 2012-01-10 DIAGNOSIS — IMO0001 Reserved for inherently not codable concepts without codable children: Secondary | ICD-10-CM | POA: Insufficient documentation

## 2012-01-10 DIAGNOSIS — M549 Dorsalgia, unspecified: Secondary | ICD-10-CM | POA: Insufficient documentation

## 2012-01-10 DIAGNOSIS — M79609 Pain in unspecified limb: Secondary | ICD-10-CM | POA: Insufficient documentation

## 2012-01-10 DIAGNOSIS — R450 Nervousness: Secondary | ICD-10-CM | POA: Insufficient documentation

## 2012-01-10 DIAGNOSIS — M7989 Other specified soft tissue disorders: Secondary | ICD-10-CM | POA: Insufficient documentation

## 2012-01-10 DIAGNOSIS — M2550 Pain in unspecified joint: Secondary | ICD-10-CM | POA: Insufficient documentation

## 2012-01-10 DIAGNOSIS — E789 Disorder of lipoprotein metabolism, unspecified: Secondary | ICD-10-CM | POA: Insufficient documentation

## 2012-01-10 HISTORY — DX: Reserved for concepts with insufficient information to code with codable children: IMO0002

## 2012-01-10 HISTORY — DX: Pure hypercholesterolemia, unspecified: E78.00

## 2012-01-10 HISTORY — DX: Restless legs syndrome: G25.81

## 2012-01-10 LAB — BASIC METABOLIC PANEL
BUN: 13 mg/dL (ref 6–23)
Chloride: 104 mEq/L (ref 96–112)
GFR calc Af Amer: >90 mL/min (ref >90)
GFR calc non Af Amer: >90 mL/min (ref >90)
Potassium: 4.2 mEq/L (ref 3.5–5.1)
Sodium: 137 mEq/L (ref 135–145)

## 2012-01-10 MED ORDER — METAXALONE 800 MG PO TABS: 800.0000 mg | ORAL_TABLET | Freq: Three times a day (TID) | ORAL | Status: AC

## 2012-01-10 MED ORDER — DIAZEPAM 5 MG PO TABS
5.0000 mg | ORAL_TABLET | Freq: Once | ORAL | Status: AC
  Administered 2012-01-10: 5 mg via ORAL
  Filled 2012-01-10: qty 1

## 2012-01-10 MED ORDER — HYDROCODONE-ACETAMINOPHEN 5-325 MG PO TABS
2.0000 | ORAL_TABLET | Freq: Once | ORAL | Status: AC
  Administered 2012-01-10: 2 via ORAL
  Filled 2012-01-10: qty 2

## 2012-01-10 MED ORDER — ONDANSETRON HCL 4 MG PO TABS
4.0000 mg | ORAL_TABLET | Freq: Once | ORAL | Status: AC
  Administered 2012-01-10: 4 mg via ORAL
  Filled 2012-01-10: qty 1

## 2012-01-10 MED ORDER — HYDROCODONE-ACETAMINOPHEN 7.5-325 MG PO TABS: 1.0000 | ORAL_TABLET | ORAL | Status: AC | PRN

## 2012-01-10 NOTE — Discharge Instructions (Signed)
Your vital signs are stable at this time. Your lower extremity Doppler study is negative for deep vein clots. Your electrolytes are well within normal limits. Please alternate heat and ice to your calf area. Please use Skelaxin 3 times daily for spasm. Please use Norco. The muscle relaxant and the pain medications may cause drowsiness, use with caution. Please see Dr Margo Aye for additional evaluation if not improving.

## 2012-01-10 NOTE — ED Notes (Signed)
Pain lt calf, awakened pt, thought she had a cramp.  Good d-p pulse.  Skin warm and dry.No known injury.

## 2012-01-10 NOTE — ED Notes (Signed)
Patient transported to Ultrasound 

## 2012-01-10 NOTE — ED Provider Notes (Signed)
History     CSN: [id number]  Arrival date & time 01/10/12  1031   First MD Initiated Contact with Patient 01/10/12 1119      Chief Complaint  Patient presents with  . Leg Pain    (Consider location/radiation/quality/duration/timing/severity/associated sxs/prior treatment) HPI Comments: Pt reports pain in the left calf and lower leg since last evening. No reported injury. Pain started last night, an would not allow pt to rest. Pain is worse with standing. Pt thinks she noted swelling last night, but some what improved today. No  Hx of vascular problem, no recent surgical procedure. Pt does sit for 6 to 8 hours during the day. No recent long driving trips.  The history is provided by the patient.    Past Medical History  Diagnosis Date  . Hypertension   . Diabetes mellitus   . Asthma   . COPD (chronic obstructive pulmonary disease)   . Restless leg   . Thyroid disease   . High cholesterol   . Bulging disc     Past Surgical History  Procedure Date  . Abdominal hysterectomy   . Cholecystectomy   . Knee surgery     right  . Nose surgery     History reviewed. No pertinent family history.  History  Substance Use Topics  . Smoking status: Never Smoker   . Smokeless tobacco: Not on file  . Alcohol Use: No    OB History    Grav Para Term Preterm Abortions TAB SAB Ect Mult Living                  Review of Systems  Constitutional: Negative for activity change.       All ROS Neg except as noted in HPI  HENT: Negative for nosebleeds and neck pain.   Eyes: Negative for photophobia and discharge.  Respiratory: Positive for wheezing. Negative for cough and shortness of breath.   Cardiovascular: Negative for chest pain and palpitations.  Gastrointestinal: Negative for abdominal pain and blood in stool.  Genitourinary: Negative for dysuria, frequency and hematuria.  Musculoskeletal: Positive for myalgias, back pain and arthralgias.  Skin: Negative.   Neurological:  Negative for dizziness, seizures and speech difficulty.  Psychiatric/Behavioral: Negative for hallucinations and confusion. The patient is nervous/anxious.     Allergies  Ampicillin; Codeine; and Sulfonamide derivatives  Home Medications   Current Outpatient Rx  Name Route Sig Dispense Refill  . ALBUTEROL SULFATE HFA 108 (90 BASE) MCG/ACT IN AERS Inhalation Inhale 2 puffs into the lungs every 6 (six) hours as needed. For asthma     . CYCLOBENZAPRINE HCL 10 MG PO TABS  Take 1/2 to 1 tablet po TID 20 tablet 0  . IBUPROFEN 200 MG PO TABS Oral Take 800 mg by mouth every 6 (six) hours as needed. For backpain     . MOMETASONE FURO-FORMOTEROL FUM 100-5 MCG/ACT IN AERO Inhalation Inhale 2 puffs into the lungs 2 (two) times daily.      [name] OLMESARTAN MEDOXOMIL 40 MG PO TABS Oral Take 40 mg by mouth daily.      [name] OMEPRAZOLE 40 MG PO CPDR Oral Take 40 mg by mouth daily.      [name] PREDNISONE 50 MG PO TABS Oral Take 1 tablet (50 mg total) by mouth daily. 6 tablet 0    BP 122/87  Pulse 108  Temp(Src) 97.8 F (36.6 C) (Oral)  Resp 20  Ht 5\' 2"  (1.575 m)  Wt 295 lb (133.811 kg)  BMI  53.96 kg/m2  SpO2 97%  Physical Exam  Nursing note and vitals reviewed. Constitutional: She is oriented to person, place, and time. She appears well-developed and well-nourished.  Non-toxic appearance.  HENT:  Head: Normocephalic.  Right Ear: Tympanic membrane and external ear normal.  Left Ear: Tympanic membrane and external ear normal.  Eyes: EOM and lids are normal. Pupils are equal, round, and reactive to light.  Neck: Normal range of motion. Neck supple. Carotid bruit is not present.  Cardiovascular: Normal rate, regular rhythm, normal heart sounds, intact distal pulses and normal pulses.   Pulmonary/Chest: Breath sounds normal. No respiratory distress.  Abdominal: Soft. Bowel sounds are normal. There is no tenderness. There is no guarding.  Musculoskeletal: Normal range of motion.       Legs:      Rt and left  calves about the same size by observation. No red streaking. Not hot. Distal pulses wnl. Achilles intact. No mass. No Mass or swelling behind the left knee.  Lymphadenopathy:       Head (right side): No submandibular adenopathy present.       Head (left side): No submandibular adenopathy present.    She has no cervical adenopathy.  Neurological: She is alert and oriented to person, place, and time. She has normal strength. No cranial nerve deficit or sensory deficit.  Skin: Skin is warm and dry.  Psychiatric: Her speech is normal and behavior is normal. Her mood appears anxious.    ED Course  Procedures (including critical care time)   Labs Reviewed  BASIC METABOLIC PANEL   US Venous Img Lower Unilateral Left  01/10/2012  *RADIOLOGY REPORT*  Clinical Data: Left lower extremity pain.  LEFT LOWER EXTREMITY VENOUS DUPLEX ULTRASOUND  Technique:  Gray-scale sonography with graded compression, as well as color Doppler and duplex ultrasound were performed to evaluate the deep venous system of the lower extremity from the level of the common femoral vein through the popliteal and proximal calf veins. Spectral Doppler was utilized to evaluate flow at rest and with distal augmentation maneuvers.  Comparison:  No priors.  Findings:  Normal compressibility of the common femoral, superficial femoral, and popliteal veins is demonstrated, as well as the visualized proximal calf veins.  No filling defects to suggest DVT on grayscale or color Doppler imaging.  Doppler waveforms show normal direction of venous flow, normal respiratory phasicity and response to augmentation.  IMPRESSION: No evidence of left lower extremity deep vein thrombosis.  Original Report Authenticated By: Florencia Reasons, M.D.     No diagnosis found.    MDM  I have reviewed nursing notes, vital signs, and all appropriate lab and imaging results for this patient.  Patient has pain at the left calf to palpation. There was no  significant increased redness. Very minimal swelling noted on my examination. Doppler ultrasound was negative for DVT. Discharge the patient requested a potassium be done, she is diabetic, but does not list a diuretic related, medication. Basic metabolic panel ordered. No acute abnormality      Kathie Dike, [place] 01/14/12 1110

## 2012-01-10 NOTE — ED Notes (Signed)
Pts daughter to desk, says her mother wants her potassium checked.

## 2012-01-10 NOTE — ED Notes (Addendum)
Pt presents to er with left lower leg pain and swelling that started last pm, pt states that the leg pain started during the night, continued today, worse with walking and putting pressure on leg. Pt denies any injury,

## 2012-01-10 NOTE — ED Notes (Signed)
Requests  An ice pack for rt hand , says it hurts after blood draw. Bandage is over site.

## 2012-01-14 NOTE — ED Provider Notes (Signed)
Evaluation and management procedures were performed by the PA/NP/resident physician under my supervision/collaboration.    Shalonda Sachse D Miyu Fenderson, MD 01/14/12 2328 

## 2012-01-15 ENCOUNTER — Encounter (HOSPITAL_COMMUNITY): Payer: Self-pay | Admitting: *Deleted

## 2012-01-15 ENCOUNTER — Emergency Department (HOSPITAL_COMMUNITY): Payer: Managed Care, Other (non HMO)

## 2012-01-15 ENCOUNTER — Emergency Department (HOSPITAL_COMMUNITY)
Admission: EM | Admit: 2012-01-15 | Discharge: 2012-01-15 | Disposition: A | Discharge status: Home / Self Care | Payer: Managed Care, Other (non HMO) | Attending: Emergency Medicine | Admitting: Emergency Medicine

## 2012-01-15 DIAGNOSIS — J45901 Unspecified asthma with (acute) exacerbation: Secondary | ICD-10-CM | POA: Insufficient documentation

## 2012-01-15 DIAGNOSIS — R5381 Other malaise: Secondary | ICD-10-CM | POA: Insufficient documentation

## 2012-01-15 DIAGNOSIS — I10 Essential (primary) hypertension: Secondary | ICD-10-CM | POA: Insufficient documentation

## 2012-01-15 DIAGNOSIS — E7800 Pure hypercholesterolemia, unspecified: Secondary | ICD-10-CM | POA: Insufficient documentation

## 2012-01-15 DIAGNOSIS — R51 Headache: Secondary | ICD-10-CM | POA: Insufficient documentation

## 2012-01-15 DIAGNOSIS — R5383 Other fatigue: Secondary | ICD-10-CM | POA: Insufficient documentation

## 2012-01-15 DIAGNOSIS — R110 Nausea: Secondary | ICD-10-CM | POA: Insufficient documentation

## 2012-01-15 DIAGNOSIS — R0789 Other chest pain: Secondary | ICD-10-CM | POA: Insufficient documentation

## 2012-01-15 DIAGNOSIS — E119 Type 2 diabetes mellitus without complications: Secondary | ICD-10-CM | POA: Insufficient documentation

## 2012-01-15 DIAGNOSIS — Z79899 Other long term (current) drug therapy: Secondary | ICD-10-CM | POA: Insufficient documentation

## 2012-01-15 DIAGNOSIS — E079 Disorder of thyroid, unspecified: Secondary | ICD-10-CM | POA: Insufficient documentation

## 2012-01-15 LAB — DIFFERENTIAL
Basophils Relative: 1 % (ref 0–1)
Lymphocytes Relative: 37 % (ref 12–46)
Monocytes Absolute: 0.6 10*3/uL (ref 0.1–1.0)
Monocytes Relative: 7 % (ref 3–12)
Neutro Abs: 4.5 10*3/uL (ref 1.7–7.7)
Neutrophils Relative %: 53 % (ref 43–77)

## 2012-01-15 LAB — BASIC METABOLIC PANEL
BUN: 10 mg/dL (ref 6–23)
CO2: 27 mEq/L (ref 19–32)
Chloride: 102 mEq/L (ref 96–112)
Creatinine, Ser: 0.57 mg/dL (ref 0.50–1.10)
GFR calc Af Amer: >90 mL/min (ref >90)
Potassium: 3.6 mEq/L (ref 3.5–5.1)

## 2012-01-15 LAB — URINALYSIS, ROUTINE W REFLEX MICROSCOPIC
Bilirubin Urine: NEGATIVE
Glucose, UA: NEGATIVE mg/dL
Ketones, ur: NEGATIVE mg/dL
Leukocytes, UA: NEGATIVE
Nitrite: NEGATIVE
Specific Gravity, Urine: >1.03 — ABNORMAL HIGH (ref 1.005–1.030)
pH: 5.5 (ref 5.0–8.0)

## 2012-01-15 LAB — CBC
HCT: 41.4 % (ref 36.0–46.0)
Hemoglobin: 13.4 g/dL (ref 12.0–15.0)
RBC: 4.64 MIL/uL (ref 3.87–5.11)
WBC: 8.6 10*3/uL (ref 4.0–10.5)

## 2012-01-15 MED ORDER — SODIUM CHLORIDE 0.9 % IV SOLN
INTRAVENOUS | Status: DC
  Administered 2012-01-15: 1000 mL via INTRAVENOUS

## 2012-01-15 MED ORDER — PREDNISONE 10 MG PO TABS: 20.0000 mg | ORAL_TABLET | Freq: Two times a day (BID) | ORAL | Status: DC

## 2012-01-15 MED ORDER — ALBUTEROL (5 MG/ML) CONTINUOUS INHALATION SOLN
10.0000 mg/h | INHALATION_SOLUTION | RESPIRATORY_TRACT | Status: DC
  Administered 2012-01-15: 10 mg/h via RESPIRATORY_TRACT
  Filled 2012-01-15: qty 20

## 2012-01-15 MED ORDER — METOCLOPRAMIDE HCL 5 MG/ML IJ SOLN
10.0000 mg | Freq: Once | INTRAMUSCULAR | Status: AC
  Administered 2012-01-15: 10 mg via INTRAVENOUS
  Filled 2012-01-15: qty 2

## 2012-01-15 MED ORDER — DIPHENHYDRAMINE HCL 50 MG/ML IJ SOLN
25.0000 mg | Freq: Once | INTRAMUSCULAR | Status: AC
  Administered 2012-01-15: 25 mg via INTRAVENOUS
  Filled 2012-01-15: qty 1

## 2012-01-15 MED ORDER — METHYLPREDNISOLONE SODIUM SUCC 125 MG IJ SOLR
125.0000 mg | Freq: Once | INTRAMUSCULAR | Status: AC
  Administered 2012-01-15: 125 mg via INTRAVENOUS
  Filled 2012-01-15: qty 2

## 2012-01-15 NOTE — Discharge Instructions (Signed)
Follow up with your md in one week 

## 2012-01-15 NOTE — ED Notes (Signed)
Feels she is having asthma flare,  Chest feels "tight".  Also headache.

## 2012-01-15 NOTE — ED Provider Notes (Signed)
History    This chart was scribed for Osvaldo Human, MD, MD by Smitty Pluck. The patient was seen in room APA07 and the patient's care was started at 2:10PM.   CSN: [id number]  Arrival date & time 01/15/12  1242   First MD Initiated Contact with Patient 01/15/12 1404      Chief Complaint  Patient presents with  . Asthma    (Consider location/radiation/quality/duration/timing/severity/associated sxs/prior treatment) The history is provided by the patient.   Kristina [name] is a 41 y.o. female who presents to the Emergency Department complaining of moderate asthma onset 1 day ago. Pt reports that she has taken her breathing treatments and tylenol at home without relief. She states that she also has a moderate  headache. She mentions her chest feels tight and that she feels lethargic. Denies fever. Denies any aggravating symptoms. Reports that she developed copd and asthma later in life. She states that she has nausea.  PCP is Dr. Margo Aye  Past Medical History  Diagnosis Date  . Hypertension   . Diabetes mellitus   . Asthma   . COPD (chronic obstructive pulmonary disease)   . Restless leg   . Thyroid disease   . High cholesterol   . Bulging disc     Past Surgical History  Procedure Date  . Abdominal hysterectomy   . Cholecystectomy   . Knee surgery     right  . Nose surgery     History reviewed. No pertinent family history.  History  Substance Use Topics  . Smoking status: Never Smoker   . Smokeless tobacco: Not on file  . Alcohol Use: No    OB History    Grav Para Term Preterm Abortions TAB SAB Ect Mult Living                  Review of Systems  All other systems reviewed and are negative.   10 Systems reviewed and all are negative for acute change except as noted in the HPI.   Allergies  Ampicillin; Codeine; and Sulfonamide derivatives  Home Medications   Current Outpatient Rx  Name Route Sig Dispense Refill  . ALBUTEROL SULFATE HFA 108 (90 BASE)  MCG/ACT IN AERS Inhalation Inhale 2 puffs into the lungs every 6 (six) hours as needed. For asthma     . CYCLOBENZAPRINE HCL 10 MG PO TABS  Take 1/2 to 1 tablet po TID 20 tablet 0  . HYDROCODONE-ACETAMINOPHEN 7.5-325 MG PO TABS Oral Take 1 tablet by mouth every 4 (four) hours as needed for pain. 20 tablet 0  . IBUPROFEN 200 MG PO TABS Oral Take 800 mg by mouth every 6 (six) hours as needed. For backpain     . METAXALONE 800 MG PO TABS Oral Take 1 tablet (800 mg total) by mouth 3 (three) times daily. 21 tablet 0  . MOMETASONE FURO-FORMOTEROL FUM 100-5 MCG/ACT IN AERO Inhalation Inhale 2 puffs into the lungs 2 (two) times daily.      [name] OLMESARTAN MEDOXOMIL 40 MG PO TABS Oral Take 40 mg by mouth daily.      [name] OMEPRAZOLE 40 MG PO CPDR Oral Take 40 mg by mouth daily.      [name] PREDNISONE 50 MG PO TABS Oral Take 1 tablet (50 mg total) by mouth daily. 6 tablet 0    BP 144/98  Pulse 109  Temp(Src) 97.9 F (36.6 C) (Oral)  Resp 20  Ht 5\' 2"  (1.575 m)  Wt 300 lb (136.079  kg)  BMI 54.87 kg/m2  SpO2 97%  Physical Exam  Nursing note and vitals reviewed. Constitutional: She is oriented to person, place, and time. She appears well-developed and well-nourished. No distress.       Morbidly obese  HENT:  Head: Normocephalic and atraumatic.  Right Ear: External ear normal.  Left Ear: External ear normal.  Mouth/Throat: Oropharynx is clear and moist.  Eyes: Conjunctivae are normal. Pupils are equal, round, and reactive to light.  Neck: Neck supple. No thyromegaly present.  Cardiovascular: Normal rate, regular rhythm and normal heart sounds.   Pulmonary/Chest: No respiratory distress.       Distance breath sounds  Abdominal: Soft.  Neurological: She is alert and oriented to person, place, and time.  Skin: Skin is warm and dry.  Psychiatric: She has a normal mood and affect. Her behavior is normal.    ED Course  Procedures (including critical care time) DIAGNOSTIC STUDIES: Oxygen Saturation is 97%  on room air, normal by my interpretation.    COORDINATION OF CARE: 2:15PM EDP discusses pt ED treatment course with pt.    Date: 01/15/2012  Rate: 93  Rhythm: normal sinus rhythm  QRS Axis: normal  Intervals: normal  ST/T Wave abnormalities: normal  Conduction Disutrbances:none  Narrative Interpretation: Normal EKG  Old EKG Reviewed: unchanged  5:15PM - Dr. Estell Harpin re-evaluated pt. Pt condition has improved. Advised pt to follow up with PCP after discharge. Pt agrees with plan.    No diagnosis found.    The chart was scribed for me under my direct supervision.  I personally performed the history, physical, and medical decision making and all procedures in the evaluation of this patient.Benny Lennert, MD 01/15/12 [id number]

## 2012-01-17 LAB — URINE CULTURE
Colony Count: NO GROWTH
Culture  Setup Time: 201305020147

## 2012-01-27 ENCOUNTER — Institutional Professional Consult (permissible substitution): Payer: 59 | Admitting: Pulmonary Disease

## 2012-01-28 ENCOUNTER — Encounter: Payer: Self-pay | Admitting: Internal Medicine

## 2012-01-29 ENCOUNTER — Encounter: Payer: Self-pay | Admitting: *Deleted

## 2012-01-29 ENCOUNTER — Ambulatory Visit (INDEPENDENT_AMBULATORY_CARE_PROVIDER_SITE_OTHER): Payer: Managed Care, Other (non HMO) | Admitting: Internal Medicine

## 2012-01-29 ENCOUNTER — Encounter: Payer: Self-pay | Admitting: Internal Medicine

## 2012-01-29 VITALS — BP 141/88 | HR 117 | Temp 98.2°F | Ht 62.5 in | Wt 312.6 lb

## 2012-01-29 DIAGNOSIS — R05 Cough: Secondary | ICD-10-CM

## 2012-01-29 DIAGNOSIS — J45909 Unspecified asthma, uncomplicated: Secondary | ICD-10-CM

## 2012-01-29 DIAGNOSIS — R059 Cough, unspecified: Secondary | ICD-10-CM

## 2012-01-29 MED ORDER — TRAMADOL HCL 50 MG PO TABS: ORAL_TABLET | ORAL | Status: AC

## 2012-01-29 MED ORDER — FAMOTIDINE 20 MG PO TABS: ORAL_TABLET | ORAL | Status: DC

## 2012-01-29 MED ORDER — DEXLANSOPRAZOLE 60 MG PO CPDR: DELAYED_RELEASE_CAPSULE | ORAL | Status: DC

## 2012-01-29 NOTE — Progress Notes (Addendum)
  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 Related to passive smoke exp but no problem with breathing as adult until 2006 much worse since May 2012 so referred 01/29/2012 by Dr Catalina Pizza  01/29/2012 1st pulmonary office eval cc sob x 6-7 year worse since May 2012 progressed to point where can only walking 50 ft gives out.  Baseline wt = 200 back before breathing problems with freq steroid back injections and now on prednisone daily since May 1st of 2012 (but 9 x cycles x one year) despite ICS with marked transient sob  improvement > could walk a football field/ breathing stops her walking before back. Also lots of cough esp at hs mostly dry so severe vomits, otherwise no overt hb symptoms.  She does have chronic non-variable sense of assoc nasal congestion doesn't get much better with steroids and chronic leg swellling, also not correlating with sob. SOB some better with freq daytime saba.  Sleeping ok on 3 pillows without nocturnal  or early am exacerbation  of respiratory  c/o's or need for noct saba. Also denies any obvious fluctuation of symptoms with weather or environmental changes or other aggravating or alleviating factors except as outlined above    Review of Systems  Constitutional: Positive for unexpected weight change. Negative for fever.  HENT: Positive for sinus pressure. Negative for ear pain, nosebleeds, congestion, sore throat, rhinorrhea, sneezing, trouble swallowing, dental problem and postnasal drip.   Eyes: Positive for redness and itching.  Respiratory: Positive for cough, chest tightness, shortness of breath and wheezing.   Cardiovascular: Positive for palpitations and leg swelling.  Gastrointestinal: Positive for vomiting. Negative for nausea.  Genitourinary: Negative for dysuria.  Musculoskeletal: Negative for joint swelling.  Skin: Negative  for rash.  Neurological: Positive for headaches.  Hematological: Bruises/bleeds easily.  Psychiatric/Behavioral: Negative for dysphoric mood. The patient is nervous/anxious.        Objective:   Physical Exam amb obese wf with classic pseudowheeze Wt 322 01/29/2012  HEENT: nl dentition, turbinates, and orophanx. Nl external ear canals without cough reflex   NECK :  without JVD/Nodes/TM/ nl carotid upstrokes bilaterally   LUNGS: no acc muscle use, clear to A and P bilaterally without cough on insp or exp maneuvers   CV:  RRR  no s3 or murmur or increase in P2, no edema   ABD:  soft and nontender with nl excursion in the supine position. No bruits or organomegaly, bowel sounds nl  MS:  warm without deformities, calf tenderness, cyanosis or clubbing  SKIN: warm and dry without lesions    NEURO:  alert, approp, no deficits     01/15/12 cxr Borderline enlargement of cardiac silhouette.  Minimal subsegmental atelectasis left base.       Assessment & Plan:

## 2012-01-29 NOTE — Patient Instructions (Signed)
Work on inhaler technique:  relax and gently blow all the way out then take a nice smooth deep breath back in, triggering the inhaler at same time you start breathing in.  Hold for up to 5 seconds if you can.  Rinse and gargle with water when done   If your mouth or throat starts to bother you,   I suggest you time the inhaler to your dental care and after using the inhaler(s) brush teeth and tongue with a baking soda containing toothpaste and when you rinse this out, gargle with it first to see if this helps your mouth and throat.     Try dulera 200 Take 2 puffs first thing in am and then another 2 puffs about 12 hours later.   Change dexilant 60 mg Take 30-60 min before first meal of the day   Pepcid 20mg  one at bedtime   Take delsym two tsp every 12 hours and supplement if needed with  tramadol 50 mg up to 1- 2 every 4 hours to suppress the urge to cough. Swallowing water or using ice chips/non mint and menthol containing candies (such as lifesavers or sugarless jolly ranchers) are also effective.  You should rest your voice and avoid activities that you know make you cough.  Once you have eliminated the cough for 3 straight days try reducing the tramadol first,  then the delsym as tolerated.    GERD (REFLUX)  is an extremely common cause of respiratory symptoms, many times with no significant heartburn at all.    It can be treated with medication, but also with lifestyle changes including avoidance of late meals, excessive alcohol, smoking cessation, and avoid fatty foods, chocolate, peppermint, colas, red wine, and acidic juices such as orange juice.  NO MINT OR MENTHOL PRODUCTS SO NO COUGH DROPS  USE SUGARLESS CANDY INSTEAD (jolley ranchers or Stover's)  NO OIL BASED VITAMINS - use powdered substitutes.   Prednisone 10 mg with breakfast until better then one each am until gone   Please schedule a follow up office visit in 4 weeks, sooner if needed

## 2012-01-29 NOTE — Telephone Encounter (Signed)
Encounter created in error

## 2012-01-30 DIAGNOSIS — J45909 Unspecified asthma, uncomplicated: Secondary | ICD-10-CM | POA: Insufficient documentation

## 2012-01-30 DIAGNOSIS — R05 Cough: Secondary | ICD-10-CM | POA: Insufficient documentation

## 2012-01-30 DIAGNOSIS — R059 Cough, unspecified: Secondary | ICD-10-CM | POA: Insufficient documentation

## 2012-01-30 NOTE — Assessment & Plan Note (Signed)
The most common causes of chronic cough in immunocompetent adults include the following: upper airway cough syndrome (UACS), previously referred to as postnasal drip syndrome (PNDS), which is caused by variety of rhinosinus conditions; (2) asthma; (3) GERD; (4) chronic bronchitis from cigarette smoking or other inhaled environmental irritants; (5) nonasthmatic eosinophilic bronchitis; and (6) bronchiectasis.   These conditions, singly or in combination, have accounted for up to 94% of the causes of chronic cough in prospective studies.   Other conditions have constituted no >6% of the causes in prospective studies These have included bronchogenic carcinoma, chronic interstitial pneumonia, sarcoidosis, left ventricular failure, ACEI-induced cough, and aspiration from a condition associated with pharyngeal dysfunction.   Chronic cough is often simultaneously caused by more than one condition. A single cause has been found from 38 to 82% of the time, multiple causes from 18 to 62%. Multiply caused cough has been the result of three diseases up to 42% of the time.   This is most likely  Classic Upper airway cough syndrome, so named because it's frequently impossible to sort out how much is  CR/sinusitis with freq throat clearing (which can be related to primary GERD)   vs  causing  secondary (" extra esophageal")  GERD from wide swings in gastric pressure that occur with throat clearing, often  promoting self use of mint and menthol lozenges that reduce the lower esophageal sphincter tone and exacerbate the problem further in a cyclical fashion.   These are the same pts (now being labeled as having "irritable larynx syndrome" by some cough centers) who not infrequently have a history of having failed to tolerate ace inhibitors,  dry powder inhalers or biphosphonates or report having atypical reflux symptoms that don't respond to standard doses of PPI , and are easily confused as having aecopd or asthma flares  by even experienced allergists/ pulmonologists.   For now will modify rx for asthma, which I'm not really sure she has, with very low dose dulera (see asthma separately) and max rx for gerd/ cyclical coughing to see if can avoid further systemic steroids for exacerbations.

## 2012-01-30 NOTE — Assessment & Plan Note (Signed)
Not really sure this is asthma but whatever it is it's very difficult to control. DDX of  difficult airways managment all start with A and  include Adherence, Ace Inhibitors, Acid Reflux, Active Sinus Disease, Alpha 1 Antitripsin deficiency, Anxiety masquerading as Airways dz,  ABPA,  allergy(esp in young), Aspiration (esp in elderly), Adverse effects of DPI,  Active smokers, plus two Bs  = Bronchiectasis and Beta blocker use..and one C= CHF  Adherence is always the initial "prime suspect" and is a multilayered concern that requires a "trust but verify" approach in every patient - starting with knowing how to use medications, especially inhalers, correctly, keeping up with refills and understanding the fundamental difference between maintenance and prns vs those medications only taken for a very short course and then stopped and not refilled. The proper method of use, as well as anticipated side effects, of a metered-dose inhaler are discussed and demonstrated to the patient. Improved effectiveness after extensive coaching during this visit to a level of approximately  75% so try dulera 100 2bid  ? Acid reflux > max gerd/ diet/ eliminate cyclical coughing if possible

## 2012-02-12 ENCOUNTER — Encounter (HOSPITAL_COMMUNITY): Payer: Self-pay | Admitting: *Deleted

## 2012-02-12 ENCOUNTER — Emergency Department (HOSPITAL_COMMUNITY): Payer: Managed Care, Other (non HMO)

## 2012-02-12 ENCOUNTER — Emergency Department (HOSPITAL_COMMUNITY)
Admission: EM | Admit: 2012-02-12 | Discharge: 2012-02-12 | Disposition: A | Discharge status: Home / Self Care | Payer: Managed Care, Other (non HMO) | Attending: Emergency Medicine | Admitting: Emergency Medicine

## 2012-02-12 ENCOUNTER — Ambulatory Visit: Payer: Managed Care, Other (non HMO) | Admitting: Internal Medicine

## 2012-02-12 ENCOUNTER — Telehealth: Payer: Self-pay | Admitting: Internal Medicine

## 2012-02-12 DIAGNOSIS — I10 Essential (primary) hypertension: Secondary | ICD-10-CM | POA: Insufficient documentation

## 2012-02-12 DIAGNOSIS — J449 Chronic obstructive pulmonary disease, unspecified: Secondary | ICD-10-CM | POA: Insufficient documentation

## 2012-02-12 DIAGNOSIS — E079 Disorder of thyroid, unspecified: Secondary | ICD-10-CM | POA: Insufficient documentation

## 2012-02-12 DIAGNOSIS — R05 Cough: Secondary | ICD-10-CM | POA: Insufficient documentation

## 2012-02-12 DIAGNOSIS — E7800 Pure hypercholesterolemia, unspecified: Secondary | ICD-10-CM | POA: Insufficient documentation

## 2012-02-12 DIAGNOSIS — G4733 Obstructive sleep apnea (adult) (pediatric): Secondary | ICD-10-CM | POA: Insufficient documentation

## 2012-02-12 DIAGNOSIS — R059 Cough, unspecified: Secondary | ICD-10-CM | POA: Insufficient documentation

## 2012-02-12 DIAGNOSIS — R079 Chest pain, unspecified: Secondary | ICD-10-CM | POA: Insufficient documentation

## 2012-02-12 DIAGNOSIS — E119 Type 2 diabetes mellitus without complications: Secondary | ICD-10-CM | POA: Insufficient documentation

## 2012-02-12 DIAGNOSIS — R0602 Shortness of breath: Secondary | ICD-10-CM

## 2012-02-12 DIAGNOSIS — Z79899 Other long term (current) drug therapy: Secondary | ICD-10-CM | POA: Insufficient documentation

## 2012-02-12 DIAGNOSIS — J4489 Other specified chronic obstructive pulmonary disease: Secondary | ICD-10-CM | POA: Insufficient documentation

## 2012-02-12 LAB — TROPONIN I: Troponin I: <0.3 ng/mL (ref <0.30)

## 2012-02-12 MED ORDER — PREDNISONE 20 MG PO TABS
40.0000 mg | ORAL_TABLET | Freq: Every day | ORAL | Status: DC
  Filled 2012-02-12: qty 2

## 2012-02-12 MED ORDER — ALBUTEROL SULFATE (5 MG/ML) 0.5% IN NEBU
5.0000 mg | INHALATION_SOLUTION | Freq: Once | RESPIRATORY_TRACT | Status: AC
  Administered 2012-02-12: 5 mg via RESPIRATORY_TRACT
  Filled 2012-02-12: qty 1

## 2012-02-12 MED ORDER — ONDANSETRON 8 MG PO TBDP
8.0000 mg | ORAL_TABLET | Freq: Once | ORAL | Status: AC
  Administered 2012-02-12: 8 mg via ORAL
  Filled 2012-02-12: qty 1

## 2012-02-12 MED ORDER — ALBUTEROL SULFATE (5 MG/ML) 0.5% IN NEBU
5.0000 mg | INHALATION_SOLUTION | Freq: Once | RESPIRATORY_TRACT | Status: AC
  Administered 2012-02-12: 5 mg via RESPIRATORY_TRACT
  Filled 2012-02-12: qty 0.5

## 2012-02-12 MED ORDER — PREDNISONE 20 MG PO TABS
40.0000 mg | ORAL_TABLET | Freq: Once | ORAL | Status: AC
  Administered 2012-02-12: 40 mg via ORAL

## 2012-02-12 MED ORDER — IPRATROPIUM BROMIDE 0.02 % IN SOLN
0.5000 mg | Freq: Once | RESPIRATORY_TRACT | Status: AC
  Administered 2012-02-12: 0.5 mg via RESPIRATORY_TRACT
  Filled 2012-02-12: qty 2.5

## 2012-02-12 MED ORDER — PREDNISONE 20 MG PO TABS: 60.0000 mg | ORAL_TABLET | Freq: Every day | ORAL | Status: AC

## 2012-02-12 NOTE — ED Provider Notes (Signed)
History     CSN: [id number]  Arrival date & time 02/12/12  1651   First MD Initiated Contact with Patient 02/12/12 1709      Chief Complaint  Patient presents with  . Shortness of Breath  . Chest Pain    (Consider location/radiation/quality/duration/timing/severity/associated sxs/prior treatment) HPI Comments: Patient with history of COPD comes in today with a chief complaint of chest tightness.  She reports that she began feeling this tightness approximately 7 hours prior to arrival while sitting at her desk at work.  She reports that she is also feeling short of breath.  She has been feeling short of breath for the past 6-8 months, but feels that it is gradually worsening.  She reports that the tightness in her chest does not radiate.  No diaphoresis, nausea, vomiting, or associated numbness/tingling.  She reports that she has also had a productive cough for the past 2 weeks, which is gradually becoming worse.  No prolonged travel in the past 4 weeks, no surgery in the past 4 weeks, she is not on any estrogen containing medications, no prior history of DVT/PE, no lower extremity edema or erythema.  Patient is followed by Sandrea Hughs with Pulmonology.  She is currently taking Prednisone 10 mg daily to help with her COPD.  She took one 10mg  tablet of Prednisone this morning, which she feels helped her shortness of breath somewhat.    The history is provided by the patient.    Past Medical History  Diagnosis Date  . Hypertension   . Diabetes mellitus   . Asthma   . COPD (chronic obstructive pulmonary disease)   . Restless leg   . Thyroid disease   . High cholesterol   . Bulging disc   . Obstructive sleep apnea     Past Surgical History  Procedure Date  . Abdominal hysterectomy   . Cholecystectomy   . Knee surgery 2011    right  . Nose surgery     Family History  Problem Relation Age of Onset  . Emphysema Mother   . Allergies Mother   . Allergies Child   . Heart disease  Father     deceased at age 4 from heart attack  . Heart disease Mother   . Asthma Son   . Asthma Brother   . Asthma Brother   . Asthma Mother   . Ovarian cancer Mother   . Lung cancer Maternal Aunt     History  Substance Use Topics  . Smoking status: Never Smoker   . Smokeless tobacco: Never Used  . Alcohol Use: No    OB History    Grav Para Term Preterm Abortions TAB SAB Ect Mult Living                  Review of Systems  Constitutional: Negative for fever, chills and diaphoresis.  HENT: Negative for congestion, rhinorrhea, neck pain, postnasal drip and sinus pressure.   Respiratory: Positive for cough, chest tightness and shortness of breath. Negative for wheezing.   Gastrointestinal: Negative for nausea and vomiting.  Skin: Negative for rash.  Neurological: Negative for dizziness, syncope, light-headedness, numbness and headaches.    Allergies  Ampicillin; Codeine; and Sulfonamide derivatives  Home Medications   Current Outpatient Rx  Name Route Sig Dispense Refill  . ALBUTEROL SULFATE HFA 108 (90 BASE) MCG/ACT IN AERS Inhalation Inhale 2 puffs into the lungs every 6 (six) hours as needed. Shortness of breath    . ALPRAZOLAM 0.25  MG PO TABS Oral Take 1 tablet by mouth Every 8 hours as needed.    [name] AMLODIPINE BESYLATE 5 MG PO TABS Oral Take 5 mg by mouth daily.    [name] CETIRIZINE HCL 10 MG PO TABS Oral Take 10 mg by mouth daily.    . DEXLANSOPRAZOLE 60 MG PO CPDR  Take 30-60 min before first meal of the day    . FAMOTIDINE 20 MG PO TABS Oral Take 20 mg by mouth at bedtime. One at bedtime    . LEVOTHYROXINE SODIUM 50 MCG PO TABS Oral Take 50 mcg by mouth daily.    [name] METHOCARBAMOL 750 MG PO TABS Oral Take 750 mg by mouth 2 (two) times daily as needed. Muscle spasm    . MOMETASONE FURO-FORMOTEROL FUM 100-5 MCG/ACT IN AERO Inhalation Inhale 2 puffs into the lungs 2 (two) times daily. For shortness of breath    . ONDANSETRON HCL 4 MG PO TABS Oral Take 1 tablet by mouth  Every 4 hours as needed. nausea    . PREDNISONE 10 MG PO TABS Oral Take 10 mg by mouth daily.    [name] VALSARTAN-HYDROCHLOROTHIAZIDE 160-25 MG PO TABS Oral Take 1 tablet by mouth Daily.      BP 160/91  Pulse 95  Temp(Src) 97.9 F (36.6 C) (Oral)  Resp 12  SpO2 100%  Physical Exam  Nursing note and vitals reviewed. Constitutional: She appears well-developed and well-nourished. No distress.       Morbidly obese  HENT:  Head: Normocephalic and atraumatic.  Nose: Nose normal.  Mouth/Throat: Uvula is midline, oropharynx is clear and moist and mucous membranes are normal.  Neck: Normal range of motion. Neck supple.  Cardiovascular: Normal rate, regular rhythm, normal heart sounds and intact distal pulses.   Pulmonary/Chest: Effort normal. No accessory muscle usage. Not tachypneic. No respiratory distress. She has no decreased breath sounds. She has wheezes. She has no rhonchi. She has no rales. She exhibits tenderness.  Neurological: She is alert.  Skin: Skin is warm and dry. She is not diaphoretic.  Psychiatric: She has a normal mood and affect.    ED Course  Procedures (including critical care time)   Labs Reviewed  TROPONIN I   Dg Chest 2 View  02/12/2012  *RADIOLOGY REPORT*  Clinical Data: Shortness of breath.  CHEST - 2 VIEW  Comparison: Chest 01/15/2012.  Findings: Lungs are clear.  Heart size upper normal.  No pneumothorax or effusion.  IMPRESSION: No acute disease.  Original Report Authenticated By: Bernadene Bell. Maricela Curet, M.D.     No diagnosis found.   Date: 02/12/2012  Rate: 91  Rhythm: normal sinus rhythm  QRS Axis: normal  Intervals: PR prolonged  ST/T Wave abnormalities: normal  Conduction Disutrbances:first-degree A-V block   Narrative Interpretation:   Old EKG Reviewed: unchanged  Patient discussed with Dr. Juleen China.  8:39 PM Patient reports that her symptoms have not improved after receiving two breathing treatments.   Lungs CTAB.   8:52 PM Patient ambulated  while on pulse ox.  Her pulse ox remained at 95 with ambulation. MDM  Patient with a history of COPD presenting with productive cough x 2 weeks and chest tightness.   Negative CXR.  Negative troponin.  No acute changes on EKG.  PERC negative.  Patient given Prednisone and two breathing treatments while in ED.  She reports that her symptoms have not improved and is asking for a blood gas.  However, patient is oxygenating at 98-99 on RA and with  ambulation she oxygenated at 95.  Lungs CTAB prior to discharge.  No respiratory distress.  Patient instructed to follow up with Pulmonologist tomorrow.  Return precautions discussed.          Pascal Lux Blaine, PA-C 02/13/12 1424

## 2012-02-12 NOTE — ED Notes (Addendum)
Pt states "I have sleep apnea and I use a cpap at night, I've been coughing so much that I throw up and also having tightness in my chest, SOB whenever I get up to do anything, I was in bed 9.5 hrs last night but only slept for a few hours I'm feeling sleep deprived. I took 10mg  of prednisone and its helping a little bit I just feel lethargic"

## 2012-02-12 NOTE — Telephone Encounter (Signed)
Pt stated she was having Increased SOB, severe chest heaviness/pain, almost passing out, flushed, and could not drive herself. She had to get her daughter to drive her. Pt sounded labored with breathing on the phone. I informed patient that if she felt she needed to go to ER due to the severity of her symptoms to be assessed then she should go and I would inform MW. Pt stated she felt she should go to the ER due to the fatigued and passing out feelings. I informed MW of this and will route to him as another FYI in writing.

## 2012-02-12 NOTE — ED Notes (Signed)
Rt called

## 2012-02-12 NOTE — Discharge Instructions (Signed)
Read instructions below for reasons to return to the Emergency Department. It is recommended that your follow up with your Primary Care Doctor in regards to today's visit. If you do not have a doctor, use the resource guide listed below to help you find one.  ° °Chest Pain (Nonspecific)  °HOME CARE INSTRUCTIONS  °For the next few days, avoid physical activities that bring on chest pain. Continue physical activities as directed.  °Do not smoke cigarettes or drink alcohol until your symptoms are gone.  °Only take over-the-counter or prescription medicine for pain, discomfort, or fever as directed by your caregiver.  °Follow your caregiver's suggestions for further testing if your chest pain does not go away.  °Keep any follow-up appointments you made. If you do not go to an appointment, you could develop lasting (chronic) problems with pain. If there is any problem keeping an appointment, you must call to reschedule.  °SEEK MEDICAL CARE IF:  °You think you are having problems from the medicine you are taking. Read your medicine instructions carefully.  °Your chest pain does not go away, even after treatment.  °You develop a rash with blisters on your chest.  °SEEK IMMEDIATE MEDICAL CARE IF:  °You have increased chest pain or pain that spreads to your arm, neck, jaw, back, or belly (abdomen).  °You develop shortness of breath, an increasing cough, or you are coughing up blood.  °You have severe back or abdominal pain, feel sick to your stomach (nauseous) or throw up (vomit).  °You develop severe weakness, fainting, or chills.  °You have an oral temperature above 102° F (38.9° C), not controlled by medicine.  ° °THIS IS AN EMERGENCY. Do not wait to see if the pain will go away. Get medical help at once. Call your local emergency services (911 in [place] Do not drive yourself to the hospital. ° ° °RESOURCE GUIDE ° °Dental Problems ° °Patients with Medicaid: °[facility]                     [facility] °[address]                                           [address] °Phone:  [phone]                                                  Phone:  [phone] ° °If unable to pay or uninsured, contact:  [facility] or [facility] to become qualified for the adult dental clinic. ° °Chronic Pain Problems °Contact [facility]  [phone] °Patients need to be referred by their primary care doctor. ° °Insufficient Money for Medicine °Contact [employer]:  call "211" or [facility] 271-5999. ° °No Primary Care Doctor °Call [facility]  832-8000 °Other agencies that provide inexpensive medical care °   [facility]  [phone] °   [facility]  832-7272 °   [facility]  [phone] °   [facility]  832-4777 °   [facility]  [phone] °   [facility]  [phone] ° °Psychological Services °[facility]  [phone] °[facility]  [phone] °[facility]   [phone] (emergency services 641-4993) ° °Substance Abuse Resources °[facility]  336-882-2125 °[facility] 336-784-9470 °[facility] 336-285-9073 °[facility] 336-845-3988 °Residential & Outpatient Substance Abuse Program  800-659-3381 ° °Abuse/Neglect °[place] [facility] (336) 641-3795 °[place] [facility] Hotline 800-378-5315 (After Hours) ° °Emergency Shelter °[facility] (336) 271-5985 ° °Maternity Homes °[facility] (336) 275-9566 °[facility] (704) 372-4663 ° °MRSA Hotline #:   832-7006 ° ° ° °[place] [facility] ° °[facility] [facility]     [employer]                          [facility] °[address] [place]                       [address]      [address]  °[place]                                                [place]                             [place] °Phone:  349-3220                                   Phone:  [phone]                 Phone:  342-8140 ° °[facility] °Phone:  342-8316 ° °[place] [facility] Hotline °(336) 342-1394 °(336) 342-3537 (After Hours) ° ° °

## 2012-02-13 ENCOUNTER — Encounter: Payer: Self-pay | Admitting: *Deleted

## 2012-02-13 ENCOUNTER — Encounter: Payer: Self-pay | Admitting: Internal Medicine

## 2012-02-13 ENCOUNTER — Ambulatory Visit (INDEPENDENT_AMBULATORY_CARE_PROVIDER_SITE_OTHER): Payer: Managed Care, Other (non HMO) | Admitting: Internal Medicine

## 2012-02-13 VITALS — BP 110/68 | HR 114 | Temp 98.5°F | Ht 62.5 in | Wt 307.2 lb

## 2012-02-13 DIAGNOSIS — R059 Cough, unspecified: Secondary | ICD-10-CM

## 2012-02-13 DIAGNOSIS — R0602 Shortness of breath: Secondary | ICD-10-CM

## 2012-02-13 DIAGNOSIS — R05 Cough: Secondary | ICD-10-CM

## 2012-02-13 NOTE — Assessment & Plan Note (Signed)
-   Spirometry nl 02/13/2012 x for voluntary portion truncation, non-physiologic   - 02/13/2012  Walked RA x 3 laps @ 185 ft each stopped due to  End of study, no desat  Symptoms are markedly disproportionate to objective findings and not clear this is a lung problem but pt does appear to have difficult airway management issues. DDX of  difficult airways managment all start with A and  include Adherence, Ace Inhibitors, Acid Reflux, Active Sinus Disease, Alpha 1 Antitripsin deficiency, Anxiety masquerading as Airways dz,  ABPA,  allergy(esp in young), Aspiration (esp in elderly), Adverse effects of DPI,  Active smokers, plus two Bs  = Bronchiectasis and Beta blocker use..and one C= CHF  ? Acid reflux > continue max rx  ? Anxiety> usually dx of exclusion but strongly supported by today's eval with multiple atypical features and fv loop not at all suggestive of asthma but rather vcd

## 2012-02-13 NOTE — Patient Instructions (Signed)
Take delsym two tsp every 12 hours and supplement if needed with  tramadol 50 mg up to 2 every 4 hours to suppress the urge to cough. Swallowing water or using ice chips/non mint and menthol containing candies (such as lifesavers or sugarless jolly ranchers) are also effective.  You should rest your voice and avoid activities that you know make you cough.  Once you have totally eliminated the cough and throat clearing 100% for 3 straight days try reducing the tramadol first,  then the delsym as tolerated.    Work on Chemical engineer technique:  relax and gently blow all the way out then take a nice smooth deep breath back in, triggering the inhaler at same time you start breathing in.  Hold for up to 5 seconds if you can.  Rinse and gargle with water when done   If your mouth or throat starts to bother you,   I suggest you time the inhaler to your dental care and after using the inhaler(s) brush teeth and tongue with a baking soda containing toothpaste and when you rinse this out, gargle with it first to see if this helps your mouth and throat.   Prednisone 20mg  x 3 days, 10 mg x 3 days then 5 mg x 3 days and stop  Please schedule a follow up office visit in 2 weeks, sooner if needed  With all active medications in hand

## 2012-02-13 NOTE — Assessment & Plan Note (Signed)
Of the three most common causes of chronic cough, only one (GERD)  can actually cause the other two (asthma and post nasal drip syndrome)  and perpetuate the cylce of cough inducing airway trauma, inflammation, heightened sensitivity to reflux which is prompted by the cough itself via a cyclical mechanism.    This may partially respond to steroids and look like asthma and post nasal drainage but never erradicated completely unless the cough and the secondary reflux are eliminated, preferably both at the same time.  While not intuitively obvious, many patients with chronic low grade reflux do not cough until there is a secondary insult that disturbs the protective epithelial barrier and exposes sensitive nerve endings.  This can be viral or direct physical injury such as with an endotracheal tube.   The point is that once this occurs, it is difficult to eliminate using anything but a maximally effective acid suppression regimen at least in the short run, accompanied by an appropriate diet to address non acid GERD.  See instructions for specific recommendations which were reviewed directly with the patient who was given a copy with highlighter outlining the key components.  

## 2012-02-13 NOTE — Progress Notes (Signed)
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 Related to passive smoke exp but no problem with breathing as adult until 2006 much worse since May 2012 so referred 01/29/2012 by Kristina [name]  01/29/2012 1st pulmonary office eval cc intermittent  sob x 6-7 years and transiently better x months s inhalters  persistent daily symptoms  since May 2012 progressed to point where can only walk  50 ft gives out.  Baseline wt = 200 back before breathing problems with freq steroid back injections and now on prednisone daily since May 1st of 2012 (but 9 x cycles x one year) despite ICS with marked transient sob  improvement > prior to May 2012 weight was 260-270 could walk a 5 k and now  breathing stops her walking before back. Also lots of cough esp at hs mostly dry so severe vomits, otherwise no overt hb symptoms.  She does have chronic non-variable sense of assoc nasal congestion doesn't get much better with steroids and chronic leg swellling, also not correlating with sob. SOB some better with freq daytime saba. rec Work on inhaler technique:  Try dulera 200 Take 2 puffs first thing in am and then another 2 puffs about 12 hours later.  Change dexilant 60 mg Take 30-60 min before first meal of the day  Pepcid 20mg  one at bedtime  Take delsym two tsp every 12 hours and supplement if needed with  tramadol 50 mg up to 1- 2 every 4 hours to suppress the urge to cough.  Once you have eliminated the cough for 3 straight days try reducing the tramadol first,  then the delsym as tolerated.   GERD  diet Prednisone 10 mg with breakfast until better then one each am until gone    02/13/2012 f/u ov/Kristina [name] cc cough so hard vomiting and ended up at [facility] ER 02/12/12 > 50% better on day of ov s need for saba p pred increased to 40 mg and nebs in er with "wheezing heard across  the room" no purulent sputum or overt hb/ sinus complaints  Sleeping ok on 3 pillows without nocturnal  or early am exacerbation  of respiratory  c/o's or need for noct saba. Also denies any obvious fluctuation of symptoms with weather or environmental changes or other aggravating or alleviating factors except as outlined above   ROS  At present neg for  any significant sore throat, dysphagia, dental problems, itching, sneezing,  nasal congestion or excess/ purulent secretions, ear ache,   fever, chills, sweats, unintended wt loss, pleuritic or exertional cp, hemoptysis, palpitations, orthopnea pnd or leg swelling.  Also denies presyncope, palpitations, heartburn, abdominal pain, anorexia, nausea, vomiting, diarrhea  or change in bowel or urinary habits, change in stools or urine, dysuria,hematuria,  rash, arthralgias, visual complaints, headache, numbness weakness or ataxia or problems with walking or coordination. No noted change in mood/affect or memory.          Objective:   Physical Exam  amb obese wf with classic pseudowheeze talking a mile a minute nad  Wt 322 01/29/2012 > 307 02/13/2012   HEENT: nl dentition, turbinates, and orophanx. Nl external ear canals without cough reflex   NECK :  without JVD/Nodes/TM/ nl carotid upstrokes bilaterally   LUNGS: no acc muscle use, clear to A and P bilaterally  without cough on insp or exp maneuvers   CV:  RRR  no s3 or murmur or increase in P2, no edema   ABD:  soft and nontender with nl excursion in the supine position. No bruits or organomegaly, bowel sounds nl  MS:  warm without deformities, calf tenderness, cyanosis or clubbing       01/15/12 cxr Borderline enlargement of cardiac silhouette.  Minimal subsegmental atelectasis left base.       Assessment & Plan:

## 2012-02-19 NOTE — ED Provider Notes (Signed)
Medical screening examination/treatment/procedure(s) were performed by non-physician practitioner and as supervising physician I was immediately available for consultation/collaboration.  Raeford Razor, MD 02/19/12 2249

## 2012-03-02 ENCOUNTER — Encounter: Payer: Self-pay | Admitting: Internal Medicine

## 2012-03-02 ENCOUNTER — Encounter: Payer: Self-pay | Admitting: *Deleted

## 2012-03-02 ENCOUNTER — Ambulatory Visit (INDEPENDENT_AMBULATORY_CARE_PROVIDER_SITE_OTHER): Payer: Managed Care, Other (non HMO) | Admitting: Internal Medicine

## 2012-03-02 VITALS — BP 128/100 | HR 116 | Temp 97.8°F | Ht 62.0 in | Wt 301.0 lb

## 2012-03-02 DIAGNOSIS — R05 Cough: Secondary | ICD-10-CM

## 2012-03-02 DIAGNOSIS — R059 Cough, unspecified: Secondary | ICD-10-CM

## 2012-03-02 DIAGNOSIS — R0602 Shortness of breath: Secondary | ICD-10-CM

## 2012-03-02 MED ORDER — METOCLOPRAMIDE HCL 10 MG PO TABS: 10.0000 mg | ORAL_TABLET | Freq: Four times a day (QID) | ORAL | Status: DC

## 2012-03-02 MED ORDER — NEBIVOLOL HCL 10 MG PO TABS: 10.0000 mg | ORAL_TABLET | Freq: Every day | ORAL | Status: DC

## 2012-03-02 NOTE — Assessment & Plan Note (Signed)
-   Spirometry nl 02/13/2012 x for voluntary portion truncation, non-physiologic   - 02/13/2012  Walked RA x 3 laps @ 185 ft each stopped due to  End of study, no desat   - 03/02/2012  Walked RA x 3 laps @ 185 ft each stopped due to  End of study, no desat  Symptoms are markedly disproportionate to objective findings and not clear this is a lung problem but pt does appear to have difficult airway management issues. DDX of  difficult airways managment all start with A and  include Adherence, Ace Inhibitors, Acid Reflux, Active Sinus Disease, Alpha 1 Antitripsin deficiency, Anxiety masquerading as Airways dz,  ABPA,  allergy(esp in young), Aspiration (esp in elderly), Adverse effects of DPI,  Active smokers, plus two Bs  = Bronchiectasis and Beta blocker use..and one C= CHF  Adherence is always the initial "prime suspect" and is a multilayered concern that requires a "trust but verify" approach in every patient - starting with knowing how to use medications, especially inhalers, correctly, keeping up with refills and understanding the fundamental difference between maintenance and prns vs those medications only taken for a very short course and then stopped and not refilled. The proper method of use, as well as anticipated side effects, of a metered-dose inhaler are discussed and demonstrated to the patient. Improved effectiveness after extensive coaching during this visit to a level of approximately  75%  Acid reflux> reduce pepsi as much as possible, trial of short term reglan (has been on it before s side effects) Explained she must have reflux is she's coughing to hard she vomits, though not necessarily the cause of the cough.  Anxiety typically a dx of exclusion but since she still has symptoms on max prednisone and cough suppression strongly suspect part of her problem is primary anxiety disorder.

## 2012-03-02 NOTE — Patient Instructions (Addendum)
Stop norvasc.  Bystolic 10  mg one daily  Reglan 10 mg take before  bfast lunch and supper and at bedtime and ok reduce dose by one half if makes you tremulous.  Work on inhaler technique:  relax and gently blow all the way out then take a nice smooth deep breath back in, triggering the inhaler at same time you start breathing in.  Hold for up to 5 seconds if you can.  Rinse and gargle with water when done.   If your mouth or throat starts to bother you,   I suggest you time the inhaler to your dental care and after using the inhaler(s) brush teeth and tongue with a baking soda containing toothpaste and when you rinse this out, gargle with it first to see if this helps your mouth and throat.     GERD (REFLUX)  is an extremely common cause of respiratory symptoms, many times with no significant heartburn at all.    It can be treated with medication, but also with lifestyle changes including avoidance of late meals, excessive alcohol, smoking cessation, and avoid fatty foods, chocolate, peppermint, colas, red wine, and acidic juices such as orange juice.  NO MINT OR MENTHOL PRODUCTS SO NO COUGH DROPS  USE SUGARLESS CANDY INSTEAD (jolley ranchers or Stover's)  NO OIL BASED VITAMINS - use powdered substitutes.  Please schedule a follow up office visit in 2 weeks, call sooner if needed

## 2012-03-02 NOTE — Progress Notes (Signed)
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 Related to passive smoke exp but no problem with breathing as adult until 2006 much worse since May 2012 so referred 01/29/2012 by Dr Catalina Pizza  01/29/2012 1st pulmonary office eval cc intermittent  sob x 6-7 years and transiently better x months s inhalers  persistent daily symptoms  since May 2012 progressed to point where can only walk  50 ft gives out.  Baseline wt = 200 back before breathing problems with freq steroid back injections and now on prednisone daily since May 1st of 2012 (but 9 x cycles x one year) despite ICS with marked transient sob  improvement > prior to May 2012 weight was 260-270 could walk a 5 k and now  breathing stops her walking before back. Also lots of cough esp at hs mostly dry so severe vomits, otherwise no overt hb symptoms.  She does have chronic non-variable sense of assoc nasal congestion doesn't get much better with steroids and chronic leg swellling, also not correlating with sob. SOB some better with freq daytime saba. rec Work on inhaler technique:  Try dulera 200 Take 2 puffs first thing in am and then another 2 puffs about 12 hours later.  Change dexilant 60 mg Take 30-60 min before first meal of the day  Pepcid 20mg  one at bedtime  Take delsym two tsp every 12 hours and supplement if needed with  tramadol 50 mg up to 1- 2 every 4 hours to suppress the urge to cough.  Once you have eliminated the cough for 3 straight days try reducing the tramadol first,  then the delsym as tolerated.   GERD  diet Prednisone 10 mg with breakfast until better then one each am until gone    02/13/2012 f/u ov/[name] cc cough so hard vomiting and ended up at [facility] ER 02/12/12 > 50% better on day of ov s need for saba p pred increased to 40 mg and nebs in er with "wheezing heard across  the room" no purulent sputum or overt hb/ sinus complaints. rec Take delsym two tsp every 12 hours and supplement if needed with  tramadol 50 mg up to 2 every 4 hours to suppress the urge to cough.  Once you have totally eliminated the cough and throat clearing 100% for 3 straight days try reducing the tramadol first,  then the delsym as tolerated.   Work on Archivist:    Prednisone 20mg  x 3 days, 10 mg x 3 days then 5 mg x 3 days and stop Please schedule a follow up office visit in 2 weeks, sooner if needed  With all active medications in hand   03/02/2012 f/u ov/[name] cc only does well on 60 mg of prednisone then below 40 flares esp cough and sob p stir in am mostly dry until eventually gags/ vomits.   off prednisone since 6/13 but note even on 60 mg per day cc her cough never resolved, had been on reglan in past. Worried can't do job due to cough and sob because has to walk 50 ft to [employer]. No purulent sputum. No overt hb. Following diet x drinking lots of pepsi   Sleeping ok on 3 pillows without nocturnal  or early am exacerbation  of respiratory  c/o's or need for  noct saba. Also denies any obvious fluctuation of symptoms with weather or environmental changes or other aggravating or alleviating factors except as outlined above   ROS  At present neg for  any significant sore throat, dysphagia, dental problems, itching, sneezing,  nasal congestion or excess/ purulent secretions, ear ache,   fever, chills, sweats, unintended wt loss, pleuritic or exertional cp, hemoptysis, palpitations, orthopnea pnd or leg swelling.  Also denies presyncope, palpitations, heartburn, abdominal pain, anorexia, nausea, vomiting, diarrhea  or change in bowel or urinary habits, change in stools or urine, dysuria,hematuria,  rash, arthralgias, visual complaints, headache, numbness weakness or ataxia or problems with walking or coordination. No noted change in mood/affect or memory.          Objective:    Physical Exam  amb obese wf still with quite rapid speech no wob  Wt 322 01/29/2012 > 307 02/13/2012  > 301 03/02/2012   HEENT: nl dentition, turbinates, and orophanx. Nl external ear canals without cough reflex   NECK :  without JVD/Nodes/TM/ nl carotid upstrokes bilaterally   LUNGS: no acc muscle use, clear to A and P bilaterally without cough on insp or exp maneuvers, mild pseudowheeze   CV:  RRR  no s3 or murmur or increase in P2, no edema   ABD:  soft and nontender with nl excursion in the supine position. No bruits or organomegaly, bowel sounds nl  MS:  warm without deformities, calf tenderness, cyanosis or clubbing       01/15/12 cxr Borderline enlargement of cardiac silhouette.  Minimal subsegmental atelectasis left base.       Assessment & Plan:

## 2012-03-02 NOTE — Assessment & Plan Note (Signed)
Most c/w  Classic Upper airway cough syndrome, so named because it's frequently impossible to sort out how much is  CR/sinusitis with freq throat clearing (which can be related to primary GERD)   vs  causing  secondary (" extra esophageal")  GERD from wide swings in gastric pressure that occur with throat clearing, often  promoting self use of mint and menthol lozenges that reduce the lower esophageal sphincter tone and exacerbate the problem further in a cyclical fashion.   These are the same pts (now being labeled as having "irritable larynx syndrome" by some cough centers) who not infrequently have a history of having failed to tolerate ace inhibitors,  dry powder inhalers or biphosphonates or report having atypical reflux symptoms that don't respond to standard doses of PPI , and are easily confused as having aecopd or asthma flares by even experienced allergists/ pulmonologists.   For now max rx for gerd and asthma but try to avoid further prednisone exposure if possible

## 2012-03-03 ENCOUNTER — Telehealth: Payer: Self-pay | Admitting: Internal Medicine

## 2012-03-03 MED ORDER — NEBIVOLOL HCL 10 MG PO TABS: 10.0000 mg | ORAL_TABLET | Freq: Every day | ORAL | Status: DC

## 2012-03-03 NOTE — Telephone Encounter (Signed)
LMTCB

## 2012-03-03 NOTE — Telephone Encounter (Signed)
Pt returned call.  She will not be in [place] until 6.20.13.  Script sent to verified pharmacy and samples left up front for pt to pick up at her convenience.  Nothing further needed.  Will sign off.

## 2012-03-03 NOTE — Telephone Encounter (Signed)
Pt returned call. Kristina [name] °

## 2012-03-03 NOTE — Telephone Encounter (Signed)
Patient Instructions     Stop norvasc.  Bystolic 10 mg one daily  Reglan 10 mg take before bfast lunch and supper and at bedtime and ok reduce dose by one half if makes you tremulous.  Work on inhaler technique: relax and gently blow all the way out then take a nice smooth deep breath back in, triggering the inhaler at same time you start breathing in. Hold for up to 5 seconds if you can. Rinse and gargle with water when done.  If your mouth or throat starts to bother you, I suggest you time the inhaler to your dental care and after using the inhaler(s) brush teeth and tongue with a baking soda containing toothpaste and when you rinse this out, gargle with it first to see if this helps your mouth and throat.  GERD (REFLUX) is an extremely common cause of respiratory symptoms, many times with no significant heartburn at all.  It can be treated with medication, but also with lifestyle changes including avoidance of late meals, excessive alcohol, smoking cessation, and avoid fatty foods, chocolate, peppermint, colas, red wine, and acidic juices such as orange juice.  NO MINT OR MENTHOL PRODUCTS SO NO COUGH DROPS  USE SUGARLESS CANDY INSTEAD (jolley ranchers or Stover's)  NO OIL BASED VITAMINS - use powdered substitutes.  Please schedule a follow up office visit in 2 weeks, call sooner if needed       LMOM TCB x1.  Will be happy to leave her samples of the bystolic 10mg  for her to pick up; or if she is unable to come to pick up samples, will call in rx.  *pt has ov with TP 7.1.13

## 2012-03-13 ENCOUNTER — Encounter (HOSPITAL_COMMUNITY): Payer: Self-pay | Admitting: *Deleted

## 2012-03-13 ENCOUNTER — Emergency Department (HOSPITAL_COMMUNITY)
Admission: EM | Admit: 2012-03-13 | Discharge: 2012-03-13 | Disposition: A | Discharge status: Home / Self Care | Payer: Managed Care, Other (non HMO) | Attending: Emergency Medicine | Admitting: Emergency Medicine

## 2012-03-13 DIAGNOSIS — E079 Disorder of thyroid, unspecified: Secondary | ICD-10-CM | POA: Insufficient documentation

## 2012-03-13 DIAGNOSIS — J4489 Other specified chronic obstructive pulmonary disease: Secondary | ICD-10-CM | POA: Insufficient documentation

## 2012-03-13 DIAGNOSIS — M549 Dorsalgia, unspecified: Secondary | ICD-10-CM | POA: Insufficient documentation

## 2012-03-13 DIAGNOSIS — E7800 Pure hypercholesterolemia, unspecified: Secondary | ICD-10-CM | POA: Insufficient documentation

## 2012-03-13 DIAGNOSIS — G2581 Restless legs syndrome: Secondary | ICD-10-CM | POA: Insufficient documentation

## 2012-03-13 DIAGNOSIS — G4733 Obstructive sleep apnea (adult) (pediatric): Secondary | ICD-10-CM | POA: Insufficient documentation

## 2012-03-13 DIAGNOSIS — E119 Type 2 diabetes mellitus without complications: Secondary | ICD-10-CM | POA: Insufficient documentation

## 2012-03-13 DIAGNOSIS — I10 Essential (primary) hypertension: Secondary | ICD-10-CM | POA: Insufficient documentation

## 2012-03-13 DIAGNOSIS — J449 Chronic obstructive pulmonary disease, unspecified: Secondary | ICD-10-CM | POA: Insufficient documentation

## 2012-03-13 MED ORDER — DEXAMETHASONE 6 MG PO TABS: ORAL_TABLET | ORAL | Status: AC

## 2012-03-13 MED ORDER — DIAZEPAM 5 MG PO TABS: ORAL_TABLET | ORAL | Status: DC

## 2012-03-13 MED ORDER — ONDANSETRON HCL 4 MG PO TABS
4.0000 mg | ORAL_TABLET | Freq: Once | ORAL | Status: AC
  Administered 2012-03-13: 4 mg via ORAL
  Filled 2012-03-13: qty 1

## 2012-03-13 MED ORDER — MORPHINE SULFATE 10 MG/ML IJ SOLN
10.0000 mg | Freq: Once | INTRAMUSCULAR | Status: AC
  Administered 2012-03-13: 10 mg via INTRAMUSCULAR
  Filled 2012-03-13: qty 1

## 2012-03-13 MED ORDER — DEXAMETHASONE SODIUM PHOSPHATE 4 MG/ML IJ SOLN
8.0000 mg | Freq: Once | INTRAMUSCULAR | Status: AC
  Administered 2012-03-13: 8 mg via INTRAMUSCULAR
  Filled 2012-03-13: qty 2
  Filled 2012-03-13: qty 1

## 2012-03-13 MED ORDER — DIAZEPAM 5 MG PO TABS
5.0000 mg | ORAL_TABLET | Freq: Once | ORAL | Status: AC
  Administered 2012-03-13: 5 mg via ORAL
  Filled 2012-03-13: qty 1

## 2012-03-13 NOTE — Discharge Instructions (Signed)
Please apply heating pad to your back. Add Valium three times daily and dexamethasone daily to your current medication. Please give your MD an update on your status Monday July 1. Valium may cause drowsiness, use with caution.

## 2012-03-13 NOTE — ED Notes (Signed)
Reviewed d/c instructions, meds and f/u recommendations w/pt.  Verbalized understanding.  Able to ambulate steadily.  Daughter present and will drive pt. Home.  Pt. Stable, respirations unlabored.

## 2012-03-13 NOTE — ED Notes (Signed)
Back and bil leg pain, Hx sciatica, worse since 5am . No injury

## 2012-03-13 NOTE — ED Notes (Signed)
Pt. Has bulging L4 for which she sees Dr. Venetia Maxon at [facility] neurosurgical.  Had two epidural injections for bilateral sciatic pain on 03/05/12 which have not helped.  Has also taken pain meds prescribed by Dr. Venetia Maxon, but this has not helped pain either.  Awoke this AM w/severe pain in R foot and progressing burning, shooting pains in bilateral legs.

## 2012-03-13 NOTE — ED Provider Notes (Signed)
History     CSN: [id number]  Arrival date & time 03/13/12  1228   First MD Initiated Contact with Patient 03/13/12 1315      Chief Complaint  Patient presents with  . Back Pain    (Consider location/radiation/quality/duration/timing/severity/associated sxs/prior treatment) Patient is a 41 y.o. female presenting with back pain. The history is provided by the patient.  Back Pain  This is a chronic problem. The problem occurs daily. The problem has been gradually worsening. The pain is associated with no known injury. The pain is present in the lumbar spine. The quality of the pain is described as aching and shooting. The pain is severe. The symptoms are aggravated by bending and certain positions. The pain is the same all the time. Associated symptoms include paresthesias. Pertinent negatives include no chest pain, no fever, no abdominal pain, no bowel incontinence, no perianal numbness, no bladder incontinence and no dysuria. She has tried analgesics for the symptoms.    Past Medical History  Diagnosis Date  . Hypertension   . Diabetes mellitus   . Asthma   . COPD (chronic obstructive pulmonary disease)   . Restless leg   . Thyroid disease   . High cholesterol   . Bulging disc   . Obstructive sleep apnea     Past Surgical History  Procedure Date  . Abdominal hysterectomy   . Cholecystectomy   . Knee surgery 2011    right  . Nose surgery     Family History  Problem Relation Age of Onset  . Emphysema Mother   . Allergies Mother   . Allergies Child   . Heart disease Father     deceased at age 82 from heart attack  . Heart disease Mother   . Asthma Son   . Asthma Brother   . Asthma Brother   . Asthma Mother   . Ovarian cancer Mother   . Lung cancer Maternal Aunt     History  Substance Use Topics  . Smoking status: Never Smoker   . Smokeless tobacco: Never Used  . Alcohol Use: No    OB History    Grav Para Term Preterm Abortions TAB SAB Ect Mult Living                Review of Systems  Constitutional: Negative for fever and activity change.       All ROS Neg except as noted in HPI  HENT: Positive for sneezing. Negative for nosebleeds and neck pain.   Eyes: Negative for photophobia and discharge.  Respiratory: Positive for wheezing. Negative for cough and shortness of breath.   Cardiovascular: Negative for chest pain and palpitations.  Gastrointestinal: Negative for abdominal pain, blood in stool and bowel incontinence.  Genitourinary: Negative for bladder incontinence, dysuria, frequency and hematuria.  Musculoskeletal: Positive for back pain. Negative for arthralgias.  Skin: Negative.   Neurological: Positive for paresthesias. Negative for dizziness, seizures and speech difficulty.  Psychiatric/Behavioral: Positive for disturbed wake/sleep cycle. Negative for hallucinations and confusion.    Allergies  Ampicillin; Codeine; and Sulfonamide derivatives  Home Medications   Current Outpatient Rx  Name Route Sig Dispense Refill  . ALBUTEROL SULFATE HFA 108 (90 BASE) MCG/ACT IN AERS Inhalation Inhale 2 puffs into the lungs every 6 (six) hours as needed. Shortness of breath    . ALPRAZOLAM 0.25 MG PO TABS Oral Take 1 tablet by mouth Every 8 hours as needed.    [name] CETIRIZINE HCL 10 MG PO TABS Oral Take  10 mg by mouth daily.    . DEXLANSOPRAZOLE 60 MG PO CPDR  Take 30-60 min before first meal of the day    . FAMOTIDINE 20 MG PO TABS Oral Take 20 mg by mouth at bedtime. One at bedtime    . HYDROCODONE-ACETAMINOPHEN 5-325 MG PO TABS  1 every 6 hours as needed for pain    . LEVOTHYROXINE SODIUM 50 MCG PO TABS Oral Take 50 mcg by mouth daily.    [name] METHOCARBAMOL 750 MG PO TABS Oral Take 750 mg by mouth 2 (two) times daily as needed. Muscle spasm    . METOCLOPRAMIDE HCL 10 MG PO TABS Oral Take 10 mg by mouth 4 (four) times daily.    . NEBIVOLOL HCL 10 MG PO TABS Oral Take 10 mg by mouth daily.    [name] ONDANSETRON HCL 4 MG PO TABS Oral Take 1 tablet  by mouth Every 4 hours as needed. nausea    . TRAMADOL HCL 50 MG PO TABS  1 to 2 every 4 hours as needed for cough    . VALSARTAN-HYDROCHLOROTHIAZIDE 160-25 MG PO TABS Oral Take 1 tablet by mouth Daily.    [name] METOCLOPRAMIDE HCL 10 MG PO TABS Oral Take 1 tablet (10 mg total) by mouth 4 (four) times daily. 120 tablet 0    BP 144/87  Pulse 104  Temp 98 F (36.7 C) (Oral)  Resp 20  Ht 5\' 2"  (1.575 m)  Wt 296 lb (134.265 kg)  BMI 54.14 kg/m2  SpO2 95%  Physical Exam  Nursing note and vitals reviewed. Constitutional: She is oriented to person, place, and time. She appears well-developed and well-nourished.  Non-toxic appearance.  HENT:  Head: Normocephalic.  Right Ear: Tympanic membrane and external ear normal.  Left Ear: Tympanic membrane and external ear normal.  Eyes: EOM and lids are normal. Pupils are equal, round, and reactive to light.  Neck: Normal range of motion. Neck supple. Carotid bruit is not present.  Cardiovascular: Normal rate, regular rhythm, normal heart sounds, intact distal pulses and normal pulses.   Pulmonary/Chest: Breath sounds normal. No respiratory distress.  Abdominal: Soft. Bowel sounds are normal. There is no tenderness. There is no guarding.  Musculoskeletal: Normal range of motion.       Lumbar area pain with palpation and attempted ROM. Pain of the lower back with limited leg raises.  Lymphadenopathy:       Head (right side): No submandibular adenopathy present.       Head (left side): No submandibular adenopathy present.    She has no cervical adenopathy.  Neurological: She is alert and oriented to person, place, and time. She has normal strength. No cranial nerve deficit or sensory deficit.  Skin: Skin is warm and dry.  Psychiatric: She has a normal mood and affect. Her speech is normal.    ED Course  Procedures (including critical care time)  Labs Reviewed - No data to display No results found.   No diagnosis found.    MDM  I have  reviewed nursing notes, vital signs, and all appropriate lab and imaging results for this patient. Pt has hx of sciatica and chronic back pain. No neuro deficit noted today. Rx for decadron and valium to be added to current medication. Pt to see primary MD for MRI or additional work up.       Kathie Dike, [place] 03/20/12 1154

## 2012-03-16 ENCOUNTER — Encounter: Payer: Self-pay | Admitting: Pulmonary Disease

## 2012-03-16 ENCOUNTER — Other Ambulatory Visit (HOSPITAL_COMMUNITY): Payer: Self-pay | Admitting: Obstetrics and Gynecology

## 2012-03-16 ENCOUNTER — Ambulatory Visit (INDEPENDENT_AMBULATORY_CARE_PROVIDER_SITE_OTHER): Payer: Managed Care, Other (non HMO) | Admitting: Adult Health

## 2012-03-16 ENCOUNTER — Encounter: Payer: Self-pay | Admitting: Adult Health

## 2012-03-16 ENCOUNTER — Ambulatory Visit
Admission: RE | Admit: 2012-03-16 | Discharge: 2012-03-16 | Disposition: A | Discharge status: Home / Self Care | Payer: Managed Care, Other (non HMO) | Source: Ambulatory Visit | Attending: Obstetrics and Gynecology | Admitting: Obstetrics and Gynecology

## 2012-03-16 ENCOUNTER — Encounter: Payer: Self-pay | Admitting: *Deleted

## 2012-03-16 ENCOUNTER — Telehealth: Payer: Self-pay | Admitting: Adult Health

## 2012-03-16 ENCOUNTER — Other Ambulatory Visit: Payer: Self-pay | Admitting: Obstetrics and Gynecology

## 2012-03-16 VITALS — BP 108/78 | HR 73 | Temp 96.1°F | Ht 62.0 in | Wt 297.0 lb

## 2012-03-16 DIAGNOSIS — R059 Cough, unspecified: Secondary | ICD-10-CM

## 2012-03-16 DIAGNOSIS — J45909 Unspecified asthma, uncomplicated: Secondary | ICD-10-CM

## 2012-03-16 DIAGNOSIS — R05 Cough: Secondary | ICD-10-CM

## 2012-03-16 DIAGNOSIS — G4733 Obstructive sleep apnea (adult) (pediatric): Secondary | ICD-10-CM

## 2012-03-16 DIAGNOSIS — Z1231 Encounter for screening mammogram for malignant neoplasm of breast: Secondary | ICD-10-CM

## 2012-03-16 DIAGNOSIS — R5383 Other fatigue: Secondary | ICD-10-CM

## 2012-03-16 DIAGNOSIS — R5381 Other malaise: Secondary | ICD-10-CM

## 2012-03-16 NOTE — Progress Notes (Signed)
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 Related to passive smoke exp but no problem with breathing as adult until 2006 much worse since May 2012 so referred 01/29/2012 by Dr Catalina Pizza  01/29/2012 1st pulmonary office eval cc intermittent  sob x 6-7 years and transiently better x months s inhalers  persistent daily symptoms  since May 2012 progressed to point where can only walk  50 ft gives out.  Baseline wt = 200 back before breathing problems with freq steroid back injections and now on prednisone daily since May 1st of 2012 (but 9 x cycles x one year) despite ICS with marked transient sob  improvement > prior to May 2012 weight was 260-270 could walk a 5 k and now  breathing stops her walking before back. Also lots of cough esp at hs mostly dry so severe vomits, otherwise no overt hb symptoms.  She does have chronic non-variable sense of assoc nasal congestion doesn't get much better with steroids and chronic leg swellling, also not correlating with sob. SOB some better with freq daytime saba. rec Work on inhaler technique:  Try dulera 200 Take 2 puffs first thing in am and then another 2 puffs about 12 hours later.  Change dexilant 60 mg Take 30-60 min before first meal of the day  Pepcid 20mg  one at bedtime  Take delsym two tsp every 12 hours and supplement if needed with  tramadol 50 mg up to 1- 2 every 4 hours to suppress the urge to cough.  Once you have eliminated the cough for 3 straight days try reducing the tramadol first,  then the delsym as tolerated.   GERD  diet Prednisone 10 mg with breakfast until better then one each am until gone    02/13/2012 f/u ov/Wert cc cough so hard vomiting and ended up at [facility] ER 02/12/12 > 50% better on day of ov s need for saba p pred increased to 40 mg and nebs in er with "wheezing heard across the  room" no purulent sputum or overt hb/ sinus complaints. rec Take delsym two tsp every 12 hours and supplement if needed with  tramadol 50 mg up to 2 every 4 hours to suppress the urge to cough.  Once you have totally eliminated the cough and throat clearing 100% for 3 straight days try reducing the tramadol first,  then the delsym as tolerated.   Work on Archivist:    Prednisone 20mg  x 3 days, 10 mg x 3 days then 5 mg x 3 days and stop Please schedule a follow up office visit in 2 weeks, sooner if needed  With all active medications in hand   03/02/2012 f/u ov/Wert cc only does well on 60 mg of prednisone then below 40 flares esp cough and sob p stir in am mostly dry until eventually gags/ vomits.   off prednisone since 6/13 but note even on 60 mg per day cc her cough never resolved, had been on reglan in past. Worried can't do job due to cough and sob because has to walk 50 ft to [employer]. No purulent sputum. No overt hb. Following diet x drinking lots of pepsi >rx reglan , d/c norvasc rx bystolic   03/16/2012 Follow up  Returns for a follow up office visit.  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  Has been undergoing evaluation of chronic cough and dyspnea that has been present for ~7 years  But worse for last 1 year.  PCP has several tx regimens with Dulera, steroid packs.  Seen by Dr. Sherene Sires  For initial consult on 5/15 tx w/ aggressive cough suppression regimen w/  Delsym and tramadol along with GERD prevention -PPI /Pepcid  Seen 2 weeks ago, started on reglan. And changed from norvasc to bystolic  Not tolerating Dulera or reglan.  Feels she may be slightly better but cough is not gone.  No wheezing or increased dyspnea off dulera.  No fever or chest pain .  CXR w/ no acute process.  No sinus drainage.  Does have OSA but not wearing CPAP due to cough.  Wants a referral to sleep specialist  Also wants referral to Thana Ates at [facility]  Seen in ER a few days ago for back pain  , placed back on steroid taper.    ROS:  Constitutional:   No  weight loss, night sweats,  Fevers, chills,  +fatigue, or  lassitude.  HEENT:   No headaches,  Difficulty swallowing,  Tooth/dental problems, or  Sore throat,                No sneezing, itching, ear ache, nasal congestion, post nasal drip,   CV:  No chest pain,  Orthopnea, PND, swelling in lower extremities, anasarca, dizziness, palpitations, syncope.   GI  No heartburn, indigestion, abdominal pain, nausea, vomiting, diarrhea, change in bowel habits, loss of appetite, bloody stools.   Resp:   No coughing up of blood.     No chest wall deformity  Skin: no rash or lesions.  GU: no dysuria, change in color of urine, no urgency or frequency.  No flank pain, no hematuria   MS:  No joint pain or swelling.  No decreased range of motion.  No back pain.  Psych:  No change in mood or affect. No depression or anxiety.  No memory loss.          Objective:   Physical Exam  amb obese wf   Wt 322 01/29/2012 > 307 02/13/2012  > 301 03/02/2012 >297 03/16/2012   HEENT: nl dentition, turbinates, and orophanx. EAC clear    NECK :  without JVD/Nodes/TM/ nl carotid upstrokes bilaterally   LUNGS:  CTA clear  CV:  RRR  no s3 or murmur or increase in P2, no edema   ABD:  soft and nontender with nl excursion in the supine position. No bruits or organomegaly, bowel sounds nl  MS:  warm without deformities, calf tenderness, cyanosis or clubbing       01/15/12 cxr Borderline enlargement of cardiac silhouette.  Minimal subsegmental atelectasis left base.       Assessment & Plan:

## 2012-03-16 NOTE — Patient Instructions (Addendum)
Taper steroids as directed  Continue on Delsym 2 tsp every 12 hr  May use Tramadol 50mg  1-2 every 4 hr as needed for breakthrough cough .  Continue on Dexilant daily  Continue on Pepcid 20mg  At bedtime   Continue on Bystolic daily  GERD diet . Follow up Dr. Sherene Sires  In 4 weeks  We are referring you one of our sleep specialist.  May stop Reglan and Dulera  Please contact office for sooner follow up if symptoms do not improve or worsen or seek emergency care

## 2012-03-16 NOTE — Addendum Note (Signed)
Addended by: Boone Master E on: 03/16/2012 04:53 PM   Modules accepted: Orders

## 2012-03-16 NOTE — Assessment & Plan Note (Signed)
Upper airway cough syndrome>Unresponsive to traditional therapy Request for referral to [facility] -Dr. Marisa Sprinkles   Plan:  Taper steroids as directed  Continue on Delsym 2 tsp every 12 hr  May use Tramadol 50mg  1-2 every 4 hr as needed for breakthrough cough .  Continue on Dexilant daily  Continue on Pepcid 20mg  At bedtime   Continue on Bystolic daily  GERD diet . Follow up Dr. Sherene Sires  In 4 weeks  We are referring you one of our sleep specialist.  May stop Reglan and Dulera  Please contact office for sooner follow up if symptoms do not improve or worsen or seek emergency care

## 2012-03-16 NOTE — Assessment & Plan Note (Signed)
Referral to sleep specialist per pt request sent

## 2012-03-16 NOTE — Telephone Encounter (Signed)
Contacted patient and notified of work not ready to be picked up. Patient states will pick up today.

## 2012-03-20 NOTE — ED Provider Notes (Signed)
Medical screening examination/treatment/procedure(s) were performed by non-physician practitioner and as supervising physician I was immediately available for consultation/collaboration.   Xandrea Clarey, MD 03/20/12 1956 

## 2012-04-14 ENCOUNTER — Encounter: Payer: Managed Care, Other (non HMO) | Admitting: Internal Medicine

## 2012-04-14 NOTE — Progress Notes (Signed)
 This encounter was created in error - please disregard.

## 2012-04-14 NOTE — Patient Instructions (Signed)
Taper steroids as directed  Continue on Delsym 2 tsp every 12 hr  May use Tramadol 50mg 1-2 every 4 hr as needed for breakthrough cough .  Continue on Dexilant daily  Continue on Pepcid 20mg At bedtime   Continue on Bystolic daily  GERD diet . Follow up Dr. Day Greb  In 4 weeks  We are referring you one of our sleep specialist.  May stop Reglan and Dulera  Please contact office for sooner follow up if symptoms do not improve or worsen or seek emergency care   

## 2012-04-16 ENCOUNTER — Ambulatory Visit (HOSPITAL_COMMUNITY): Payer: Managed Care, Other (non HMO)

## 2012-04-24 ENCOUNTER — Institutional Professional Consult (permissible substitution): Payer: Managed Care, Other (non HMO) | Admitting: Pulmonary Disease

## 2012-05-07 ENCOUNTER — Encounter: Payer: Managed Care, Other (non HMO) | Admitting: Internal Medicine

## 2012-05-07 NOTE — Progress Notes (Signed)
 This encounter was created in error - please disregard.

## 2012-05-11 ENCOUNTER — Other Ambulatory Visit: Payer: Self-pay | Admitting: Internal Medicine

## 2012-05-22 ENCOUNTER — Institutional Professional Consult (permissible substitution): Payer: Managed Care, Other (non HMO) | Admitting: Pulmonary Disease

## 2012-06-05 ENCOUNTER — Encounter: Payer: Managed Care, Other (non HMO) | Admitting: Internal Medicine

## 2012-06-05 NOTE — Progress Notes (Signed)
 This encounter was created in error - please disregard.

## 2012-08-03 ENCOUNTER — Telehealth: Payer: Self-pay | Admitting: Internal Medicine

## 2012-08-03 NOTE — Telephone Encounter (Signed)
Request PA form be faxed to triage. Awaiting form.

## 2012-08-03 NOTE — Telephone Encounter (Signed)
Member ID #[id number] P. Called Medicaid at [phone]. Bystolic is not covered. Preferred medications are:  Atenolol, Bisoprolol,Metoprolol, Nadolol, Carvedilol, Sotalol and Propanolol. Pls advise if one of the covered alternatives would be appropriate for the pt.

## 2012-08-05 ENCOUNTER — Emergency Department (HOSPITAL_COMMUNITY)
Admission: EM | Admit: 2012-08-05 | Discharge: 2012-08-05 | Disposition: A | Discharge status: Home / Self Care | Payer: Medicaid Other | Attending: Emergency Medicine | Admitting: Emergency Medicine

## 2012-08-05 ENCOUNTER — Encounter (HOSPITAL_COMMUNITY): Payer: Self-pay | Admitting: *Deleted

## 2012-08-05 DIAGNOSIS — M5430 Sciatica, unspecified side: Secondary | ICD-10-CM | POA: Insufficient documentation

## 2012-08-05 DIAGNOSIS — G2581 Restless legs syndrome: Secondary | ICD-10-CM | POA: Insufficient documentation

## 2012-08-05 DIAGNOSIS — J4489 Other specified chronic obstructive pulmonary disease: Secondary | ICD-10-CM | POA: Insufficient documentation

## 2012-08-05 DIAGNOSIS — J45909 Unspecified asthma, uncomplicated: Secondary | ICD-10-CM | POA: Insufficient documentation

## 2012-08-05 DIAGNOSIS — E119 Type 2 diabetes mellitus without complications: Secondary | ICD-10-CM | POA: Insufficient documentation

## 2012-08-05 DIAGNOSIS — Z79899 Other long term (current) drug therapy: Secondary | ICD-10-CM | POA: Insufficient documentation

## 2012-08-05 DIAGNOSIS — J3489 Other specified disorders of nose and nasal sinuses: Secondary | ICD-10-CM | POA: Insufficient documentation

## 2012-08-05 DIAGNOSIS — E079 Disorder of thyroid, unspecified: Secondary | ICD-10-CM | POA: Insufficient documentation

## 2012-08-05 DIAGNOSIS — J449 Chronic obstructive pulmonary disease, unspecified: Secondary | ICD-10-CM | POA: Insufficient documentation

## 2012-08-05 DIAGNOSIS — G4733 Obstructive sleep apnea (adult) (pediatric): Secondary | ICD-10-CM | POA: Insufficient documentation

## 2012-08-05 DIAGNOSIS — I10 Essential (primary) hypertension: Secondary | ICD-10-CM | POA: Insufficient documentation

## 2012-08-05 DIAGNOSIS — E7800 Pure hypercholesterolemia, unspecified: Secondary | ICD-10-CM | POA: Insufficient documentation

## 2012-08-05 DIAGNOSIS — M5431 Sciatica, right side: Secondary | ICD-10-CM

## 2012-08-05 MED ORDER — HYDROMORPHONE HCL PF 1 MG/ML IJ SOLN
1.0000 mg | Freq: Once | INTRAMUSCULAR | Status: AC
  Administered 2012-08-05: 1 mg via INTRAMUSCULAR
  Filled 2012-08-05: qty 1

## 2012-08-05 MED ORDER — ONDANSETRON 4 MG PO TBDP
4.0000 mg | ORAL_TABLET | Freq: Once | ORAL | Status: AC
  Administered 2012-08-05: 4 mg via ORAL
  Filled 2012-08-05: qty 1

## 2012-08-05 MED ORDER — PREDNISONE 50 MG PO TABS
60.0000 mg | ORAL_TABLET | Freq: Once | ORAL | Status: AC
  Administered 2012-08-05: 60 mg via ORAL
  Filled 2012-08-05: qty 1

## 2012-08-05 MED ORDER — BISOPROLOL FUMARATE 5 MG PO TABS: 5.0000 mg | ORAL_TABLET | Freq: Every day | ORAL | Status: DC

## 2012-08-05 MED ORDER — HYDROCODONE-ACETAMINOPHEN 7.5-325 MG PO TABS: 1.0000 | ORAL_TABLET | Freq: Four times a day (QID) | ORAL | Status: DC | PRN

## 2012-08-05 MED ORDER — PREDNISONE 50 MG PO TABS: 50.0000 mg | ORAL_TABLET | Freq: Every day | ORAL | Status: DC

## 2012-08-05 NOTE — Telephone Encounter (Signed)
Bisoprolol 5 mg daily 

## 2012-08-05 NOTE — Telephone Encounter (Signed)
Called [facility] in [place].  Spoke with Diane - advised ok to change bystolic to bisoprolol 5 mg qd # 30 x 0.  Diane verbalized understanding.  Called, spoke with pt.  Informed her Bystolic changed to bisoprolol 5 mg qd d/t insurance.  She verbalized understanding of this and is aware rx at [facility] for bisoprolol.  Pt advised to keep a check on BP with the new BP medication and to call if she has any problems, questions, or concerns.  She verbalized understanding.  We have scheduled her to see Dr. Sherene Sires on Nov 26 at 11:30 am as she was last seen by TP in July 2013 and asked to f/u in 4 wks.  Pt aware of pending appt.

## 2012-08-05 NOTE — ED Notes (Signed)
Headache, low back pain, No relief with pain meds.  No injury

## 2012-08-05 NOTE — ED Notes (Signed)
Pt with chronic back pain but today starting have a stabbing pain on left and right, more on right than left

## 2012-08-05 NOTE — ED Provider Notes (Signed)
Medical screening examination/treatment/procedure(s) were performed by non-physician practitioner and as supervising physician I was immediately available for consultation/collaboration.  Lakrisha Iseman T Nasir Bright, MD 08/05/12 2242 

## 2012-08-05 NOTE — ED Provider Notes (Signed)
History     CSN: [id number]  Arrival date & time 08/05/12  1739   First MD Initiated Contact with Patient 08/05/12 1812      Chief Complaint  Patient presents with  . Back Pain    (Consider location/radiation/quality/duration/timing/severity/associated sxs/prior treatment) HPI Comments: Pt has known lumbar HNP.  She is currently being tx for sinusitis by dr. Margo Aye and is on 2nd round of levaquin with mild improvement.  States he has just been laying around when her back flared up.  She is taking hydrocodone 5 mg with no relief.  She has 5 tabs left and they are not helping.  Patient is a 41 y.o. female presenting with back pain. The history is provided by the patient. No language interpreter was used.  Back Pain  This is a new problem. The current episode started 3 to 5 hours ago. The problem occurs constantly. The problem has not changed since onset.The pain is associated with no known injury. Pain location: Rlower back/upper buttocks. The pain does not radiate. The pain is severe. The symptoms are aggravated by bending and twisting. The pain is the same all the time. Pertinent negatives include no fever. She has tried nothing for the symptoms. Risk factors include obesity.    Past Medical History  Diagnosis Date  . Hypertension   . Diabetes mellitus   . Asthma   . COPD (chronic obstructive pulmonary disease)   . Restless leg   . High cholesterol   . Bulging disc   . Obstructive sleep apnea   . Thyroid disease     Past Surgical History  Procedure Date  . Abdominal hysterectomy   . Cholecystectomy   . Knee surgery 2011    right  . Nose surgery     Family History  Problem Relation Age of Onset  . Emphysema Mother   . Allergies Mother   . Allergies Child   . Heart disease Father     deceased at age 53 from heart attack  . Heart disease Mother   . Asthma Son   . Asthma Brother   . Asthma Brother   . Asthma Mother   . Ovarian cancer Mother   . Lung cancer Maternal  Aunt     History  Substance Use Topics  . Smoking status: Never Smoker   . Smokeless tobacco: Never Used  . Alcohol Use: No    OB History    Grav Para Term Preterm Abortions TAB SAB Ect Mult Living                  Review of Systems  Constitutional: Negative for fever and chills.  HENT: Positive for sinus pressure.   Musculoskeletal: Positive for back pain.  All other systems reviewed and are negative.    Allergies  Ampicillin; Codeine; and Sulfonamide derivatives  Home Medications   Current Outpatient Rx  Name  Route  Sig  Dispense  Refill  . ALBUTEROL SULFATE HFA 108 (90 BASE) MCG/ACT IN AERS   Inhalation   Inhale 2 puffs into the lungs every 6 (six) hours as needed. Shortness of breath         . ALPRAZOLAM 0.25 MG PO TABS   Oral   Take 1 tablet by mouth Every 8 hours as needed.         [name] BISOPROLOL FUMARATE 5 MG PO TABS   Oral   Take 1 tablet (5 mg total) by mouth daily.   30 tablet   0   .  CETIRIZINE HCL 10 MG PO TABS   Oral   Take 10 mg by mouth daily.         . DEXLANSOPRAZOLE 60 MG PO CPDR      Take 30-60 min before first meal of the day         . DIAZEPAM 5 MG PO TABS      1 po tid for spasm   21 tablet   0   . FAMOTIDINE 20 MG PO TABS   Oral   Take 20 mg by mouth at bedtime. One at bedtime         . HYDROCODONE-ACETAMINOPHEN 5-325 MG PO TABS      1 every 6 hours as needed for pain         . HYDROCODONE-ACETAMINOPHEN 7.5-325 MG PO TABS   Oral   Take 1 tablet by mouth every 6 (six) hours as needed for pain.   12 tablet   0   . LEVOTHYROXINE SODIUM 50 MCG PO TABS   Oral   Take 50 mcg by mouth daily.         [name] METHOCARBAMOL 750 MG PO TABS   Oral   Take 750 mg by mouth 2 (two) times daily as needed. Muscle spasm         . ONDANSETRON HCL 4 MG PO TABS   Oral   Take 1 tablet by mouth Every 4 hours as needed. nausea         . PREDNISONE 50 MG PO TABS   Oral   Take 1 tablet (50 mg total) by mouth daily.   6  tablet   0   . TRAMADOL HCL 50 MG PO TABS      1 to 2 every 4 hours as needed for cough         . VALSARTAN-HYDROCHLOROTHIAZIDE 160-25 MG PO TABS   Oral   Take 1 tablet by mouth Daily.           BP 133/88  Pulse 88  Temp 97.6 F (36.4 C) (Oral)  Resp 20  Ht 5' 2.5" (1.588 m)  Wt 272 lb (123.378 kg)  BMI 48.96 kg/m2  SpO2 97%  Physical Exam  Nursing note and vitals reviewed. Constitutional: She is oriented to person, place, and time. She appears well-developed and well-nourished. She is cooperative.  Non-toxic appearance. She does not have a sickly appearance. She does not appear ill. No distress.  HENT:  Head: Normocephalic and atraumatic.  Eyes: EOM are normal.  Neck: Normal range of motion.  Cardiovascular: Normal rate, regular rhythm and normal heart sounds.   Pulmonary/Chest: Effort normal and breath sounds normal.  Abdominal: Soft. She exhibits no distension. There is no tenderness.  Musculoskeletal:       Lumbar back: She exhibits decreased range of motion, tenderness and pain. She exhibits no bony tenderness.       Back:  Neurological: She is alert and oriented to person, place, and time.  Skin: Skin is warm and dry.  Psychiatric: She has a normal mood and affect. Judgment normal.    ED Course  Procedures (including critical care time)  Labs Reviewed - No data to display No results found.   1. Right sided sciatica       MDM  rx-prednisone 50 mg QD x 6 days rx-hydrocodone 7.5/325, 12 Ice F/u with dr. Margo Aye.        Evalina Field, [place] 08/05/12 [phone]

## 2012-08-11 ENCOUNTER — Encounter: Payer: Self-pay | Admitting: Internal Medicine

## 2012-08-11 ENCOUNTER — Ambulatory Visit (INDEPENDENT_AMBULATORY_CARE_PROVIDER_SITE_OTHER): Payer: Medicaid Other | Admitting: Internal Medicine

## 2012-08-11 VITALS — BP 130/86 | HR 79 | Temp 98.1°F | Ht 62.0 in | Wt 274.8 lb

## 2012-08-11 DIAGNOSIS — J45909 Unspecified asthma, uncomplicated: Secondary | ICD-10-CM

## 2012-08-11 DIAGNOSIS — R059 Cough, unspecified: Secondary | ICD-10-CM

## 2012-08-11 DIAGNOSIS — R05 Cough: Secondary | ICD-10-CM

## 2012-08-11 DIAGNOSIS — I10 Essential (primary) hypertension: Secondary | ICD-10-CM

## 2012-08-11 MED ORDER — PANTOPRAZOLE SODIUM 40 MG PO TBEC: 40.0000 mg | DELAYED_RELEASE_TABLET | Freq: Every day | ORAL | Status: DC

## 2012-08-11 MED ORDER — OLMESARTAN MEDOXOMIL-HCTZ 40-25 MG PO TABS: ORAL_TABLET | ORAL | Status: DC

## 2012-08-11 NOTE — Assessment & Plan Note (Signed)
-   Spirometry wnl 03/06/11    - HFA  75% 01/29/12 -referred to Dr. Thana Ates at [facility] per pt request 03/16/2012   Not clear she has asthma at all, esp since no better with inhalers,  needs methacholine challenge as next step but as plans to go to [facility] in 2 weeks will defer further here

## 2012-08-11 NOTE — Assessment & Plan Note (Addendum)
This is almost certainly  Classic Upper airway cough syndrome, so named because it's frequently impossible to sort out how much is  CR/sinusitis with freq throat clearing (which can be related to primary GERD)   vs  causing  secondary (" extra esophageal")  GERD from wide swings in gastric pressure that occur with throat clearing, often  promoting self use of mint and menthol lozenges that reduce the lower esophageal sphincter tone and exacerbate the problem further in a cyclical fashion.   These are the same pts (now being labeled as having "irritable larynx syndrome" by some cough centers) who not infrequently have a history of having failed to tolerate ace inhibitors (as is apparently the case here),  dry powder inhalers or biphosphonates or report having atypical reflux symptoms that don't respond to standard doses of PPI (which may also be the case here) , and are easily confused as having aecopd or asthma flares by even experienced allergists/ pulmonologists.      For now rec off acei, max acid suppression and f/u at [facility] as requested

## 2012-08-11 NOTE — Assessment & Plan Note (Addendum)
ACE inhibitors are problematic in  pts with airway complaints because  even experienced pulmonologists can't always distinguish ace effects from copd/asthma/pnds/ allergies etc.  By themselves they don't actually cause a problem, much like oxygen can't by itself start a fire, but they certainly serve as a powerful catalyst or enhancer for any "fire"  or inflammatory process in the upper airway, be it caused by an ET  tube or more commonly reflux (especially in the obese or pts with known GERD or who are on biphoshonates) or URI's, due to interference with bradykinin clearance.  The effects of acei on bradykinin levels occurs in 100% of pt's on acei (unless they surreptitiously stop the med!) but the classic cough is only reported in 5%.  This leaves 95% of pts on acei's  with a variety of syndromes including no identifiable symptom in most  vs non-specific symptoms that wax and wane depending on what other insult is occuring at the level of the upper airway.  In her case, given her propensity to non-specific respiratory complaints, I would avoid this class completely and gave her samples of benicar 40/25 one half daily

## 2012-08-11 NOTE — Progress Notes (Signed)
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 Related to passive smoke exp but no problem with breathing as adult until 2006 much worse since May 2012 so referred 01/29/2012 by Dr Catalina Pizza  HPI 01/29/2012 1st pulmonary office eval cc intermittent  sob x 6-7 years and transiently better x months s inhalers  persistent daily symptoms  since May 2012 progressed to point where can only walk  50 ft gives out.  Baseline wt = 200 back before breathing problems with freq steroid back injections and now on prednisone daily since May 1st of 2012 (but 9 x cycles x one year) despite ICS with marked transient sob  improvement > prior to May 2012 weight was 260-270 could walk a 5 k and now  breathing stops her walking before back. Also lots of cough esp at hs mostly dry so severe vomits, otherwise no overt hb symptoms.  She does have chronic non-variable sense of assoc nasal congestion doesn't get much better with steroids and chronic leg swellling, also not correlating with sob. SOB some better with freq daytime saba. rec Work on inhaler technique:  Try dulera 200 Take 2 puffs first thing in am and then another 2 puffs about 12 hours later.  Change dexilant 60 mg Take 30-60 min before first meal of the day  Pepcid 20mg  one at bedtime  Take delsym two tsp every 12 hours and supplement if needed with  tramadol 50 mg up to 1- 2 every 4 hours to suppress the urge to cough.  Once you have eliminated the cough for 3 straight days try reducing the tramadol first,  then the delsym as tolerated.   GERD  diet Prednisone 10 mg with breakfast until better then one each am until gone    02/13/2012 f/u ov/Siah Steely cc cough so hard vomiting and ended up at [facility] 02/12/12 > 50% better on day of ov s need for saba p pred increased to 40 mg and nebs in er with "wheezing heard  across the room" no purulent sputum or overt hb/ sinus complaints. rec Take delsym two tsp every 12 hours and supplement if needed with  tramadol 50 mg up to 2 every 4 hours to suppress the urge to cough.  Once you have totally eliminated the cough and throat clearing 100% for 3 straight days try reducing the tramadol first,  then the delsym as tolerated.   Work on Archivist:    Prednisone 20mg  x 3 days, 10 mg x 3 days then 5 mg x 3 days and stop Please schedule a follow up office visit in 2 weeks, sooner if needed  With all active medications in hand   03/02/2012 f/u ov/Wrigley Winborne cc only does well on 60 mg of prednisone then below 40 flares esp cough and sob p stir in am mostly dry until eventually gags/ vomits.   off prednisone since 6/13 but note even on 60 mg per day cc her cough never resolved, had been on reglan in past. Worried can't do job due to cough and sob because has to walk 50 ft to [employer]. No purulent sputum. No overt hb. Following diet x drinking lots of pepsi >rx reglan , d/c norvasc rx bystolic   03/16/2012 Follow up  Returns for a follow up office visit.  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  Has been undergoing evaluation of chronic cough and dyspnea that has been present for ~7 years  But worse for last 1 year.  PCP has several tx regimens with Dulera, steroid packs.  Seen by Dr. Sherene Sires  For initial consult on 5/15 tx w/ aggressive cough suppression regimen w/  Delsym and tramadol along with GERD prevention -PPI /Pepcid  Seen 2 weeks ago, started on reglan. And changed from norvasc to bystolic  Not tolerating Dulera or reglan.  Feels she may be slightly better but cough is not gone.  No wheezing or increased dyspnea off dulera.  No fever or chest pain .  CXR w/ no acute process.  No sinus drainage.  Does have OSA but not wearing CPAP due to cough.  Wants a referral to sleep specialist  Also wants referral to Thana Ates at [facility]  Seen in ER a few days ago for  back pain , placed back on steroid taper.  rec Taper steroids as directed  Continue on Delsym 2 tsp every 12 hr  May use Tramadol 50mg  1-2 every 4 hr as needed for breakthrough cough .  Continue on Dexilant daily  Continue on Pepcid 20mg  At bedtime   Continue on Bystolic daily  GERD diet . Follow up Dr. Sherene Sires  In 4 weeks  We are referring you one of our sleep specialist.  May stop Reglan and Dulera   05/07/2012 f/u ov/Trini Soldo cc missed appt  06/05/2012 f/u ov/Arda Keadle cc missed appt  08/11/2012 f/u ov/Britnee Mcdevitt cc patient states that cough  returned and worsened since prev ov. patient weight is down to 274lb from 301lb. started Pred 50mg  x 1 week for her back pain. Started Lisinopril 07/22/12 that seemed to be when the cough worsened, dry in nature.   Inhaler not helping. No obvious daytime variabilty or assoc sob  cp or chest tightness, subjective wheeze overt sinus or hb symptoms. No unusual exp hx   Sleeping ok without nocturnal  or early am exacerbation  of respiratory  c/o's or need for noct saba. Also denies any obvious fluctuation of symptoms with weather or environmental changes or other aggravating or alleviating factors except as outlined above  ROS  The following are not active complaints unless bolded sore throat, dysphagia, dental problems, itching, sneezing,  nasal congestion or excess/ purulent secretions, ear ache,   fever, chills, sweats, unintended wt loss, pleuritic or exertional cp, hemoptysis,  orthopnea pnd or leg swelling, presyncope, palpitations, heartburn, abdominal pain, anorexia, nausea, vomiting, diarrhea  or change in bowel or urinary habits, change in stools or urine, dysuria,hematuria,  rash, arthralgias, visual complaints, headache, numbness weakness or ataxia or problems with walking or coordination,  change in mood/affect or memory.              Objective:   Physical Exam  amb obese wf with very prominent pseudowheeze  Wt 322 01/29/2012 > 307 02/13/2012  > 301  03/02/2012 >297 03/16/2012  > 08/11/2012  274   HEENT: nl dentition, turbinates, and orophanx. EAC clear    NECK :  without JVD/Nodes/TM/ nl carotid upstrokes bilaterally   LUNGS:  Completely clear bilaterally, no cough on insp or exp  CV:  RRR  no s3 or murmur or increase in P2, no edema   ABD:  soft and nontender with nl excursion in the supine position. No bruits or organomegaly, bowel sounds nl  MS:  warm without deformities, calf tenderness, cyanosis or clubbing  Assessment & Plan:

## 2012-08-11 NOTE — Patient Instructions (Addendum)
Stop lisinopril  Start benicar 40/25  One half each am until you see Dr Margo Aye but would recommend you avoid ace inhibitors indefinitely  Instead of prilosec take pantoprazole 40 mg Take 30-60 min before first meal of the day  And continue pepcid at bedtime.  Use chlortrimeton 4mg  one every 6 hours as need for nasal drainage and stop zyrtec  Keep appt to see Dr Andria Meuse at [facility]  Make appt to see one of our sleep doctors next available

## 2012-08-21 ENCOUNTER — Encounter (HOSPITAL_COMMUNITY): Payer: Self-pay | Admitting: *Deleted

## 2012-08-21 ENCOUNTER — Emergency Department (HOSPITAL_COMMUNITY)
Admission: EM | Admit: 2012-08-21 | Discharge: 2012-08-21 | Disposition: A | Discharge status: Home / Self Care | Payer: Medicaid Other | Attending: Emergency Medicine | Admitting: Emergency Medicine

## 2012-08-21 DIAGNOSIS — I10 Essential (primary) hypertension: Secondary | ICD-10-CM | POA: Insufficient documentation

## 2012-08-21 DIAGNOSIS — M549 Dorsalgia, unspecified: Secondary | ICD-10-CM

## 2012-08-21 DIAGNOSIS — J449 Chronic obstructive pulmonary disease, unspecified: Secondary | ICD-10-CM | POA: Insufficient documentation

## 2012-08-21 DIAGNOSIS — J45909 Unspecified asthma, uncomplicated: Secondary | ICD-10-CM | POA: Insufficient documentation

## 2012-08-21 DIAGNOSIS — G2581 Restless legs syndrome: Secondary | ICD-10-CM | POA: Insufficient documentation

## 2012-08-21 DIAGNOSIS — J4489 Other specified chronic obstructive pulmonary disease: Secondary | ICD-10-CM | POA: Insufficient documentation

## 2012-08-21 DIAGNOSIS — M545 Low back pain, unspecified: Secondary | ICD-10-CM | POA: Insufficient documentation

## 2012-08-21 DIAGNOSIS — Z79899 Other long term (current) drug therapy: Secondary | ICD-10-CM | POA: Insufficient documentation

## 2012-08-21 DIAGNOSIS — J069 Acute upper respiratory infection, unspecified: Secondary | ICD-10-CM | POA: Insufficient documentation

## 2012-08-21 DIAGNOSIS — E119 Type 2 diabetes mellitus without complications: Secondary | ICD-10-CM | POA: Insufficient documentation

## 2012-08-21 DIAGNOSIS — G8929 Other chronic pain: Secondary | ICD-10-CM | POA: Insufficient documentation

## 2012-08-21 DIAGNOSIS — E7800 Pure hypercholesterolemia, unspecified: Secondary | ICD-10-CM | POA: Insufficient documentation

## 2012-08-21 DIAGNOSIS — E079 Disorder of thyroid, unspecified: Secondary | ICD-10-CM | POA: Insufficient documentation

## 2012-08-21 DIAGNOSIS — R51 Headache: Secondary | ICD-10-CM | POA: Insufficient documentation

## 2012-08-21 DIAGNOSIS — Z8669 Personal history of other diseases of the nervous system and sense organs: Secondary | ICD-10-CM | POA: Insufficient documentation

## 2012-08-21 DIAGNOSIS — Z8739 Personal history of other diseases of the musculoskeletal system and connective tissue: Secondary | ICD-10-CM | POA: Insufficient documentation

## 2012-08-21 DIAGNOSIS — J029 Acute pharyngitis, unspecified: Secondary | ICD-10-CM | POA: Insufficient documentation

## 2012-08-21 MED ORDER — PHENYLEPHRINE-CHLORPHEN-DM 7.5-4-30 MG/5ML PO LIQD: 5.0000 mL | Freq: Four times a day (QID) | ORAL | Status: DC | PRN

## 2012-08-21 MED ORDER — DEXAMETHASONE 6 MG PO TABS: ORAL_TABLET | ORAL | Status: DC

## 2012-08-21 MED ORDER — HYDROCODONE-ACETAMINOPHEN 7.5-325 MG PO TABS: 1.0000 | ORAL_TABLET | ORAL | Status: DC | PRN

## 2012-08-21 MED ORDER — DEXAMETHASONE SODIUM PHOSPHATE 4 MG/ML IJ SOLN
8.0000 mg | Freq: Once | INTRAMUSCULAR | Status: AC
  Administered 2012-08-21: 8 mg via INTRAMUSCULAR
  Filled 2012-08-21: qty 2

## 2012-08-21 MED ORDER — HYDROCOD POLST-CHLORPHEN POLST 10-8 MG/5ML PO LQCR
5.0000 mL | Freq: Once | ORAL | Status: AC
  Administered 2012-08-21: 5 mL via ORAL
  Filled 2012-08-21: qty 5

## 2012-08-21 MED ORDER — METHOCARBAMOL 500 MG PO TABS: ORAL_TABLET | ORAL | Status: DC

## 2012-08-21 NOTE — ED Notes (Signed)
Low back pain, headache, sob , sore throat.

## 2012-08-21 NOTE — ED Provider Notes (Signed)
Medical screening examination/treatment/procedure(s) were performed by non-physician practitioner and as supervising physician I was immediately available for consultation/collaboration.   Maily Debarge, MD 08/21/12 1902 

## 2012-08-21 NOTE — ED Provider Notes (Signed)
History     CSN: [id number]  Arrival date & time 08/21/12  1634   First MD Initiated Contact with Patient 08/21/12 1646      Chief Complaint  Patient presents with  . Back Pain    (Consider location/radiation/quality/duration/timing/severity/associated sxs/prior treatment) Patient is a 41 y.o. female presenting with back pain. The history is provided by the patient.  Back Pain  This is a chronic problem. The problem occurs daily. The problem has been gradually worsening. The pain is associated with no known injury (Hx of "bulging disc " problem.). The pain is present in the lumbar spine. The quality of the pain is described as aching and burning. The pain radiates to the right thigh. The pain is moderate. The symptoms are aggravated by certain positions. The pain is the same all the time. Stiffness is present all day. Associated symptoms include headaches. Pertinent negatives include no chest pain, no abdominal pain, no bowel incontinence, no perianal numbness, no bladder incontinence and no dysuria. Associated symptoms comments: Sore throat.. She has tried analgesics for the symptoms. The treatment provided mild relief. Risk factors: arthralgia and hx of bulging disc (lumbar)    Past Medical History  Diagnosis Date  . Hypertension   . Diabetes mellitus   . Asthma   . COPD (chronic obstructive pulmonary disease)   . Restless leg   . High cholesterol   . Bulging disc   . Obstructive sleep apnea   . Thyroid disease     Past Surgical History  Procedure Date  . Abdominal hysterectomy   . Cholecystectomy   . Knee surgery 2011    right  . Nose surgery     Family History  Problem Relation Age of Onset  . Emphysema Mother   . Allergies Mother   . Allergies Child   . Heart disease Father     deceased at age 43 from heart attack  . Heart disease Mother   . Asthma Son   . Asthma Brother   . Asthma Brother   . Asthma Mother   . Ovarian cancer Mother   . Lung cancer Maternal  Aunt     History  Substance Use Topics  . Smoking status: Never Smoker   . Smokeless tobacco: Never Used  . Alcohol Use: No    OB History    Grav Para Term Preterm Abortions TAB SAB Ect Mult Living                  Review of Systems  Constitutional: Negative for activity change.       All ROS Neg except as noted in HPI  HENT: Negative for nosebleeds and neck pain.   Eyes: Negative for photophobia and discharge.  Respiratory: Negative for cough, shortness of breath and wheezing.   Cardiovascular: Negative for chest pain and palpitations.  Gastrointestinal: Negative for abdominal pain, blood in stool and bowel incontinence.  Genitourinary: Negative for bladder incontinence, dysuria, frequency and hematuria.  Musculoskeletal: Positive for back pain and arthralgias.  Skin: Negative.   Neurological: Positive for headaches. Negative for dizziness, seizures and speech difficulty.  Psychiatric/Behavioral: Negative for hallucinations and confusion.    Allergies  Ampicillin; Codeine; and Sulfonamide derivatives  Home Medications   Current Outpatient Rx  Name  Route  Sig  Dispense  Refill  . ALBUTEROL SULFATE HFA 108 (90 BASE) MCG/ACT IN AERS   Inhalation   Inhale 2 puffs into the lungs every 6 (six) hours as needed. Shortness of breath         .  ALPRAZOLAM 0.25 MG PO TABS   Oral   Take 1 tablet by mouth Every 8 hours as needed.         [name] BISOPROLOL FUMARATE 5 MG PO TABS   Oral   Take 1 tablet (5 mg total) by mouth daily.   30 tablet   0   . DIAZEPAM 5 MG PO TABS      1 po tid for spasm   21 tablet   0   . FAMOTIDINE 20 MG PO TABS   Oral   Take 20 mg by mouth at bedtime. One at bedtime         . HYDROCODONE-ACETAMINOPHEN 7.5-325 MG PO TABS   Oral   Take 1 tablet by mouth every 6 (six) hours as needed for pain.   12 tablet   0   . LEVOFLOXACIN 500 MG PO TABS   Oral   Take 500 mg by mouth daily.         [name] LEVOTHYROXINE SODIUM 50 MCG PO TABS   Oral    Take 50 mcg by mouth daily.         [name] METHOCARBAMOL 750 MG PO TABS   Oral   Take 750 mg by mouth 2 (two) times daily as needed. Muscle spasm         . OLMESARTAN MEDOXOMIL-HCTZ 40-25 MG PO TABS      One half tablet daily         . ONDANSETRON HCL 4 MG PO TABS   Oral   Take 1 tablet by mouth Every 4 hours as needed. nausea         . PANTOPRAZOLE SODIUM 40 MG PO TBEC   Oral   Take 1 tablet (40 mg total) by mouth daily. Take 30-60 min before first meal of the day   30 tablet   2   . PREDNISONE 50 MG PO TABS   Oral   Take 1 tablet (50 mg total) by mouth daily.   6 tablet   0   . TRAMADOL HCL 50 MG PO TABS      1 to 2 every 4 hours as needed for cough           BP 126/90  Pulse 92  Temp 98.2 F (36.8 C) (Oral)  Resp 18  Ht 5\' 3"  (1.6 m)  Wt 272 lb (123.378 kg)  BMI 48.18 kg/m2  SpO2 96%  Physical Exam  Nursing note and vitals reviewed. Constitutional: She is oriented to person, place, and time. She appears well-developed and well-nourished.  Non-toxic appearance.  HENT:  Head: Normocephalic.  Right Ear: Tympanic membrane and external ear normal.  Left Ear: Tympanic membrane and external ear normal.       Increase redness of the posterior pharynx, mod swelling of the tonsils. Airway patent. Mod nasal congestion.  Eyes: EOM and lids are normal. Pupils are equal, round, and reactive to light.  Neck: Normal range of motion. Neck supple. Carotid bruit is not present.       No cervical lymphadenopathy  Cardiovascular: Normal rate, regular rhythm, normal heart sounds, intact distal pulses and normal pulses.   Pulmonary/Chest: Effort normal and breath sounds normal. No respiratory distress. She has no wheezes. She has no rales.  Abdominal: Soft. Bowel sounds are normal. There is no tenderness. There is no guarding.  Musculoskeletal: Normal range of motion.       Lower lumbar pain. Right greater than left paraspinal pain.   Lymphadenopathy:  Head (right  side): No submandibular adenopathy present.       Head (left side): No submandibular adenopathy present.    She has no cervical adenopathy.  Neurological: She is alert and oriented to person, place, and time. She has normal strength. No cranial nerve deficit or sensory deficit. She exhibits normal muscle tone. Coordination normal.       Gait steady.  Skin: Skin is warm and dry.  Psychiatric: She has a normal mood and affect. Her speech is normal.    ED Course  Procedures (including critical care time)  Labs Reviewed - No data to display No results found.   No diagnosis found.    MDM  I have reviewed nursing notes, vital signs, and all appropriate lab and imaging results for this patient. Strep test is negative. Discussed with the patient the possibility of the headache and sore throat symptoms related to an upper respiratory infection. I have reviewed the vital signs with the patient as well as the findings on today's examination. I've also encouraged the patient to have a meeting with Dr. Margo Aye to discuss a plan for managing her ongoing back pain issues. Prescription for Decadron, Robaxin, and Norco given to the patient. NeoTuss Plus cough medication given to the patient.       Kathie Dike, [place] 08/21/12 [phone]

## 2012-08-25 DIAGNOSIS — K219 Gastro-esophageal reflux disease without esophagitis: Secondary | ICD-10-CM | POA: Insufficient documentation

## 2012-08-25 DIAGNOSIS — E119 Type 2 diabetes mellitus without complications: Secondary | ICD-10-CM | POA: Insufficient documentation

## 2012-08-25 DIAGNOSIS — E785 Hyperlipidemia, unspecified: Secondary | ICD-10-CM | POA: Insufficient documentation

## 2012-08-25 DIAGNOSIS — J302 Other seasonal allergic rhinitis: Secondary | ICD-10-CM | POA: Insufficient documentation

## 2012-08-25 DIAGNOSIS — G8929 Other chronic pain: Secondary | ICD-10-CM | POA: Insufficient documentation

## 2012-08-25 DIAGNOSIS — F329 Major depressive disorder, single episode, unspecified: Secondary | ICD-10-CM | POA: Insufficient documentation

## 2012-08-25 DIAGNOSIS — E039 Hypothyroidism, unspecified: Secondary | ICD-10-CM | POA: Insufficient documentation

## 2012-08-25 DIAGNOSIS — F32A Depression, unspecified: Secondary | ICD-10-CM | POA: Insufficient documentation

## 2012-08-25 DIAGNOSIS — F419 Anxiety disorder, unspecified: Secondary | ICD-10-CM | POA: Insufficient documentation

## 2012-08-28 ENCOUNTER — Institutional Professional Consult (permissible substitution): Payer: Medicaid Other | Admitting: Pulmonary Disease

## 2012-09-13 ENCOUNTER — Other Ambulatory Visit: Payer: Self-pay | Admitting: Internal Medicine

## 2012-10-21 ENCOUNTER — Institutional Professional Consult (permissible substitution): Payer: Medicaid Other | Admitting: Pulmonary Disease

## 2012-11-11 ENCOUNTER — Ambulatory Visit (HOSPITAL_BASED_OUTPATIENT_CLINIC_OR_DEPARTMENT_OTHER): Payer: Medicaid Other | Attending: Pulmonary Disease | Admitting: Radiology

## 2012-11-11 ENCOUNTER — Encounter: Payer: Self-pay | Admitting: Pulmonary Disease

## 2012-11-11 ENCOUNTER — Ambulatory Visit (INDEPENDENT_AMBULATORY_CARE_PROVIDER_SITE_OTHER): Payer: Medicaid Other | Admitting: Pulmonary Disease

## 2012-11-11 VITALS — BP 124/84 | HR 107 | Temp 98.4°F | Ht 62.0 in | Wt 268.8 lb

## 2012-11-11 DIAGNOSIS — G4733 Obstructive sleep apnea (adult) (pediatric): Secondary | ICD-10-CM

## 2012-11-11 NOTE — Patient Instructions (Addendum)
Will have a fitting at the sleep center for a full face mask Once you find a mask that is comfortable, will optimize your pressure on an automatic device for 2 weeks.  I can determine your optimal pressure from a download. Work on weight loss as able. Will arrange followup once I receive your download from [facility], but call if you are having issues with tolerance.

## 2012-11-11 NOTE — Assessment & Plan Note (Signed)
The patient has a history of mild obstructive sleep apnea in 2012, but her history is really suggestive of more severe disease.  Also, given her morbid obesity and abnormal upper airway size, I would expect her sleep apnea to be a lot worse.  She has had issues with finding a comfortable fullface mask, and I wonder if her inability to keep the mask on during the night is because of her pressure being too low.  I would like to have her go to the sleep center for a formal mask fitting during the day, and would also like to optimize her pressure again on an automatic device.  She has asked me about the possibility of upper airway surgery, and although she has significant abnormalities there, I think she would be a high risk given her history and small airway size.  I could also consider a dental appliance, but she has significant nasal airway narrowing.  I have stressed to her the importance of aggressive weight loss.

## 2012-11-11 NOTE — Progress Notes (Signed)
Subjective:    Patient ID: Kristina [name], female    DOB: March 18, 1971, 42 y.o.   MRN: [id number]  HPI The patient is a 42 year old female who I've been asked to see for management of obstructive sleep apnea.  She was diagnosed in 2012 with mild OSA, with an AHI of 13 events per hour.  She only required 8 cm of pressure on her split night study.  The patient was started out on nasal pillows, however had issues with mouth opening.  She was tried on a chin strap, and she feels this did not help keep her mouth closed.  She was changed to a full face mask, and has not been able to wear it consistently through the night.  She pulls the mask off multiple times during the night, but is unsure if this is a claustrophobia issue or not.  The patient currently has frequent awakenings at night, and is not rested in the mornings upon arising.  She has significant daytime fatigue, but also feels there is an element of sleepiness with inactivity.  She will fall asleep very easily in the evening watching television or movies.  She also has significant sleepiness with driving.  The patient states that she lost significant weight initially, but now has gained some of this back.  Her Epworth score today is abnormal at 15.  Sleep Questionnaire What time do you typically go to bed?( Between what hours) 10-11p 10-11p at 1554 on 11/11/12 by Nita Sells, CMA How long does it take you to fall asleep? up to 1 hours up to 1 hours at 1554 on 11/11/12 by Marjo Bicker Mabe, CMA How many times during the night do you wake up? 4 4 at 1554 on 11/11/12 by Nita Sells, CMA What time do you get out of bed to start your day? 0700 0700 at 1554 on 11/11/12 by Nita Sells, CMA Do you drive or operate heavy machinery in your occupation? No No at 1554 on 11/11/12 by Nita Sells, CMA How much has your weight changed (up or down) over the past two years? (In pounds) 40 lb (18.144 kg) 40 lb (18.144 kg) at 1554 on 11/11/12 by  Nita Sells, CMA Have you ever had a sleep study before? Yes Yes at 1554 on 11/11/12 by Nita Sells, CMA If yes, location of study? [facility] at 4098 on 11/11/12 by Nita Sells, CMA If yes, date of study? 2012 2012 at 1554 on 11/11/12 by Nita Sells, CMA Do you currently use CPAP? Yes Yes at 1554 on 11/11/12 by Marjo Bicker Mabe, CMA If so, what pressure? 8?? 8?? at 1554 on 11/11/12 by Marjo Bicker Mabe, CMA Do you wear oxygen at any time? No No at 1554 on 11/11/12 by Marjo Bicker Mabe, CMA    Review of Systems  Constitutional: Positive for unexpected weight change. Negative for fever.  HENT: Positive for congestion, sore throat and trouble swallowing. Negative for ear pain, nosebleeds, rhinorrhea, sneezing, dental problem, postnasal drip and sinus pressure.   Eyes: Negative for redness and itching.  Respiratory: Positive for cough and shortness of breath. Negative for chest tightness and wheezing.   Cardiovascular: Negative for palpitations and leg swelling.  Gastrointestinal: Positive for abdominal pain. Negative for nausea and vomiting.       Acid heartburn//indigestion  Genitourinary: Negative for dysuria.  Musculoskeletal: Negative for joint swelling.  Skin: Negative for rash.  Neurological: Negative for headaches.  Hematological: Does not  bruise/bleed easily.  Psychiatric/Behavioral: Positive for dysphoric mood. The patient is nervous/anxious.        Objective:   Physical Exam Constitutional:  Morbidly obese female, no acute distress  HENT:  Nares patent without discharge, but narrowed bilat L>>R  Oropharynx without exudate, palate and uvula are thick and elongated, moderate tonsillar hypertrophy  Eyes:  Perrla, eomi, no scleral icterus  Neck:  No JVD, no TMG  Cardiovascular:  Normal rate, regular rhythm, no rubs or gallops.  No murmurs        Intact distal pulses  Pulmonary :  Normal breath sounds, no stridor or respiratory distress   No rales,  rhonchi, or wheezing  Abdominal:  Soft, nondistended, bowel sounds present.  No tenderness noted.   Musculoskeletal:  mild lower extremity edema noted.  Lymph Nodes:  No cervical lymphadenopathy noted  Skin:  No cyanosis noted  Neurologic:  Alert, appropriate, moves all 4 extremities without obvious deficit.         Assessment & Plan:

## 2012-11-23 ENCOUNTER — Emergency Department (HOSPITAL_COMMUNITY): Payer: Medicaid Other

## 2012-11-23 ENCOUNTER — Encounter (HOSPITAL_COMMUNITY): Payer: Self-pay | Admitting: *Deleted

## 2012-11-23 ENCOUNTER — Emergency Department (HOSPITAL_COMMUNITY)
Admission: EM | Admit: 2012-11-23 | Discharge: 2012-11-23 | Disposition: A | Discharge status: Home / Self Care | Payer: Medicaid Other | Attending: Emergency Medicine | Admitting: Emergency Medicine

## 2012-11-23 DIAGNOSIS — J4489 Other specified chronic obstructive pulmonary disease: Secondary | ICD-10-CM | POA: Insufficient documentation

## 2012-11-23 DIAGNOSIS — E079 Disorder of thyroid, unspecified: Secondary | ICD-10-CM | POA: Insufficient documentation

## 2012-11-23 DIAGNOSIS — R059 Cough, unspecified: Secondary | ICD-10-CM | POA: Insufficient documentation

## 2012-11-23 DIAGNOSIS — R509 Fever, unspecified: Secondary | ICD-10-CM | POA: Insufficient documentation

## 2012-11-23 DIAGNOSIS — I10 Essential (primary) hypertension: Secondary | ICD-10-CM | POA: Insufficient documentation

## 2012-11-23 DIAGNOSIS — E7800 Pure hypercholesterolemia, unspecified: Secondary | ICD-10-CM | POA: Insufficient documentation

## 2012-11-23 DIAGNOSIS — Z8739 Personal history of other diseases of the musculoskeletal system and connective tissue: Secondary | ICD-10-CM | POA: Insufficient documentation

## 2012-11-23 DIAGNOSIS — J449 Chronic obstructive pulmonary disease, unspecified: Secondary | ICD-10-CM | POA: Insufficient documentation

## 2012-11-23 DIAGNOSIS — J189 Pneumonia, unspecified organism: Secondary | ICD-10-CM

## 2012-11-23 DIAGNOSIS — E119 Type 2 diabetes mellitus without complications: Secondary | ICD-10-CM | POA: Insufficient documentation

## 2012-11-23 DIAGNOSIS — R0989 Other specified symptoms and signs involving the circulatory and respiratory systems: Secondary | ICD-10-CM | POA: Insufficient documentation

## 2012-11-23 DIAGNOSIS — Z79899 Other long term (current) drug therapy: Secondary | ICD-10-CM | POA: Insufficient documentation

## 2012-11-23 DIAGNOSIS — Z8669 Personal history of other diseases of the nervous system and sense organs: Secondary | ICD-10-CM | POA: Insufficient documentation

## 2012-11-23 DIAGNOSIS — R05 Cough: Secondary | ICD-10-CM | POA: Insufficient documentation

## 2012-11-23 MED ORDER — HYDROCODONE-ACETAMINOPHEN 5-325 MG PO TABS: 2.0000 | ORAL_TABLET | ORAL | Status: DC | PRN

## 2012-11-23 MED ORDER — HYDROCOD POLST-CHLORPHEN POLST 10-8 MG/5ML PO LQCR: 5.0000 mL | Freq: Two times a day (BID) | ORAL | Status: DC | PRN

## 2012-11-23 MED ORDER — PREDNISONE 10 MG PO TABS: 20.0000 mg | ORAL_TABLET | Freq: Two times a day (BID) | ORAL | Status: DC

## 2012-11-23 MED ORDER — IPRATROPIUM BROMIDE 0.02 % IN SOLN
0.5000 mg | Freq: Once | RESPIRATORY_TRACT | Status: AC
  Administered 2012-11-23: 0.5 mg via RESPIRATORY_TRACT
  Filled 2012-11-23: qty 2.5

## 2012-11-23 MED ORDER — AZITHROMYCIN 250 MG PO TABS: 250.0000 mg | ORAL_TABLET | Freq: Every day | ORAL | Status: DC

## 2012-11-23 MED ORDER — ALBUTEROL SULFATE (5 MG/ML) 0.5% IN NEBU
5.0000 mg | INHALATION_SOLUTION | Freq: Once | RESPIRATORY_TRACT | Status: AC
  Administered 2012-11-23: 5 mg via RESPIRATORY_TRACT
  Filled 2012-11-23: qty 1

## 2012-11-23 NOTE — ED Notes (Signed)
Cough, sob, chest "feels tight",  Sick x 1 week with asthma sx. Headache.

## 2012-11-23 NOTE — ED Provider Notes (Addendum)
History     CSN: [id number]  Arrival date & time 11/23/12  1615   First MD Initiated Contact with Patient 11/23/12 1920      Chief Complaint  Patient presents with  . Shortness of Breath    (Consider location/radiation/quality/duration/timing/severity/associated sxs/prior treatment) HPI Comments: Patient with history of RAD presents with increasing cough, chest congestion, fever for the past several days.  She has been coughing so much that her ribs hurt.    Patient is a 42 y.o. female presenting with shortness of breath. The history is provided by the patient.  Shortness of Breath Severity:  Moderate Onset quality:  Gradual Duration:  7 days Timing:  Constant Progression:  Worsening Chronicity:  New Context: activity and URI   Relieved by:  Nothing Associated symptoms: cough and fever   Cough:    Cough characteristics:  Productive   Severity:  Moderate   Past Medical History  Diagnosis Date  . Hypertension   . Diabetes mellitus   . Asthma   . COPD (chronic obstructive pulmonary disease)   . Restless leg   . High cholesterol   . Bulging disc   . Obstructive sleep apnea   . Thyroid disease     Past Surgical History  Procedure Laterality Date  . Abdominal hysterectomy    . Cholecystectomy    . Knee surgery  2011    right  . Nose surgery      Family History  Problem Relation Age of Onset  . Emphysema Mother   . Allergies Mother   . Allergies Child   . Heart disease Father     deceased at age 44 from heart attack  . Heart disease Mother   . Asthma Son   . Asthma Brother   . Asthma Brother   . Asthma Mother   . Ovarian cancer Mother   . Lung cancer Maternal Aunt     History  Substance Use Topics  . Smoking status: Never Smoker   . Smokeless tobacco: Never Used  . Alcohol Use: No    OB History   Grav Para Term Preterm Abortions TAB SAB Ect Mult Living                  Review of Systems  Constitutional: Positive for fever.  Respiratory:  Positive for cough and shortness of breath.   All other systems reviewed and are negative.    Allergies  Ampicillin; Codeine; and Sulfonamide derivatives  Home Medications   Current Outpatient Rx  Name  Route  Sig  Dispense  Refill  . albuterol (VENTOLIN HFA) 108 (90 BASE) MCG/ACT inhaler   Inhalation   Inhale 2 puffs into the lungs every 6 (six) hours as needed. Shortness of breath         . ALPRAZolam (XANAX) 0.25 MG tablet   Oral   Take 1 tablet by mouth Every 8 hours as needed. FOR ANXIETY         . bisoprolol (ZEBETA) 5 MG tablet   Oral   Take 5 mg by mouth at bedtime.         [name] dextromethorphan (DELSYM) 30 MG/5ML liquid   Oral   Take 60 mg by mouth 2 (two) times daily as needed for cough.         . diazepam (VALIUM) 5 MG tablet   Oral   Take 5 mg by mouth 3 (three) times daily as needed. 1 po tid for spasm         .  famotidine (PEPCID) 20 MG tablet   Oral   Take 20 mg by mouth at bedtime. One at bedtime         . ibuprofen (ADVIL,MOTRIN) 200 MG tablet   Oral   Take 800 mg by mouth daily as needed for pain.          [name] levothyroxine (SYNTHROID, LEVOTHROID) 50 MCG tablet   Oral   Take 50 mcg by mouth daily.         . mometasone (NASONEX) 50 MCG/ACT nasal spray   Nasal   Place 2 sprays into the nose daily.         . mometasone-formoterol (DULERA) 200-5 MCG/ACT AERO   Inhalation   Inhale 2 puffs into the lungs 2 (two) times daily.         . Naproxen Sodium (ALEVE) 220 MG CAPS   Oral   Take 220 mg by mouth once as needed (for pain).         [name] olmesartan-hydrochlorothiazide (BENICAR HCT) 40-25 MG per tablet   Oral   Take 0.5 tablets by mouth every morning. One half tablet daily         . ondansetron (ZOFRAN) 4 MG tablet   Oral   Take 1 tablet by mouth Every 4 hours as needed. nausea         . pantoprazole (PROTONIX) 40 MG tablet   Oral   Take 40 mg by mouth every morning. Take 30-60 min before first meal of the day            BP 123/64  Pulse 109  Temp(Src) 98.4 F (36.9 C) (Oral)  Resp 20  Ht 5' 2.5" (1.588 m)  Wt 282 lb (127.914 kg)  BMI 50.72 kg/m2  SpO2 92%  Physical Exam  Nursing note and vitals reviewed. Constitutional: She is oriented to person, place, and time. She appears well-developed and well-nourished. No distress.  HENT:  Head: Normocephalic and atraumatic.  Neck: Normal range of motion. Neck supple.  Cardiovascular: Normal rate and regular rhythm.  Exam reveals no gallop and no friction rub.   No murmur heard. Pulmonary/Chest: Effort normal and breath sounds normal. No respiratory distress. She has no wheezes.  Abdominal: Soft. Bowel sounds are normal. She exhibits no distension. There is no tenderness.  Musculoskeletal: Normal range of motion.  Neurological: She is alert and oriented to person, place, and time.  Skin: Skin is warm and dry. She is not diaphoretic.    ED Course  Procedures (including critical care time)  Labs Reviewed - No data to display Dg Chest 2 View  11/23/2012  *RADIOLOGY REPORT*  Clinical Data: Shortness of breath and cough  CHEST - 2 VIEW  Comparison: Chest  02/12/2012  Findings: Mild cardiomegaly appears stable. Pulmonary vascularity appears within normal limits.  There is extensive airspace disease in the left upper lobe, with areas of consolidation.  Smaller patchy area of airspace disease is seen in the right mid lung. Negative for pleural effusion.  Trachea is midline.  No acute bony abnormality is identified.  IMPRESSION: Bilateral airspace disease, left greater than right, is most consistent with multilobar pneumonia.  Recommend radiographic follow-up to clearing.   Original Report Authenticated By: Britta Mccreedy, M.D.      No diagnosis found.    MDM  The chest xray shows mutilobular pneumonia.  There is no respiratory distress and her oxygen saturations are adequate.  Will treat with zithromax, prednisone, and tussionex.  She is to continue her  inhaler at home.     When I spoke with her, she was initially okay with going home.  However, when the patient was given the instructions by the nurse, she expressed some concern about going home.  I returned to the room to discuss the issue with her.  She tells me that she has had low oxygen levels on her blood gas in the past with normal oxygen concentrations by pulse ox.  I spent some time in the room with her and never saw an oxygen saturation below 95%.  She was able to speak in full sentences without any issue and appeared quite well.  She does meet any criteria for admission.  She was also concerned about the prescription for the cough syrup as she thought it was too expensive, and also wanted something specifically for pain.  I then changed the prescription from tussionex to hydrocodone and put her back up for discharge.  I watched her ambulate down the hall without at a rapid speed without any apparent difficulty.        Geoffery Lyons, MD 11/23/12 1610  Geoffery Lyons, MD 11/23/12 [phone]

## 2012-11-23 NOTE — ED Notes (Signed)
Pt unhappy that she didn't get any medicine to go home on, states she can't make the pharmacy. Pt complaining at front desk wanting medicine. Pt instructed that [facility] is open for another 23 minutes.

## 2012-11-23 NOTE — ED Notes (Signed)
Pt updated on radiology status

## 2012-11-23 NOTE — ED Notes (Signed)
Pt uncomfortable with going at this time. EDP made aware, EDP at bedside to discuss care.

## 2012-11-23 NOTE — ED Notes (Signed)
Discharge instructions reviewed with pt, questions answered. Pt verbalized understanding. Pt walked out of room briskly and walked fast down the hall with no apparent distress or difficulty breathing noted.

## 2012-11-26 ENCOUNTER — Other Ambulatory Visit: Payer: Self-pay | Admitting: Internal Medicine

## 2013-02-20 ENCOUNTER — Other Ambulatory Visit: Payer: Self-pay | Admitting: Internal Medicine

## 2013-04-26 ENCOUNTER — Other Ambulatory Visit: Payer: Self-pay

## 2013-04-26 DIAGNOSIS — Z1231 Encounter for screening mammogram for malignant neoplasm of breast: Secondary | ICD-10-CM

## 2013-05-11 ENCOUNTER — Ambulatory Visit: Payer: Medicaid Other

## 2013-07-11 ENCOUNTER — Other Ambulatory Visit: Payer: Self-pay | Admitting: Internal Medicine

## 2013-07-24 ENCOUNTER — Emergency Department (HOSPITAL_COMMUNITY)
Admission: EM | Admit: 2013-07-24 | Discharge: 2013-07-24 | Discharge status: Against Medical Advice | Payer: Medicaid Other | Attending: Emergency Medicine | Admitting: Emergency Medicine

## 2013-07-24 ENCOUNTER — Encounter (HOSPITAL_COMMUNITY): Payer: Self-pay | Admitting: Emergency Medicine

## 2013-07-24 DIAGNOSIS — Z9981 Dependence on supplemental oxygen: Secondary | ICD-10-CM | POA: Insufficient documentation

## 2013-07-24 DIAGNOSIS — I10 Essential (primary) hypertension: Secondary | ICD-10-CM | POA: Insufficient documentation

## 2013-07-24 DIAGNOSIS — E119 Type 2 diabetes mellitus without complications: Secondary | ICD-10-CM | POA: Insufficient documentation

## 2013-07-24 DIAGNOSIS — G4733 Obstructive sleep apnea (adult) (pediatric): Secondary | ICD-10-CM | POA: Insufficient documentation

## 2013-07-24 DIAGNOSIS — J449 Chronic obstructive pulmonary disease, unspecified: Secondary | ICD-10-CM | POA: Insufficient documentation

## 2013-07-24 DIAGNOSIS — J4489 Other specified chronic obstructive pulmonary disease: Secondary | ICD-10-CM | POA: Insufficient documentation

## 2013-07-24 DIAGNOSIS — M549 Dorsalgia, unspecified: Secondary | ICD-10-CM | POA: Insufficient documentation

## 2013-09-08 ENCOUNTER — Other Ambulatory Visit (HOSPITAL_COMMUNITY): Payer: Self-pay | Admitting: Family Medicine

## 2013-09-08 DIAGNOSIS — M545 Low back pain, unspecified: Secondary | ICD-10-CM

## 2013-09-13 ENCOUNTER — Telehealth: Payer: Self-pay | Admitting: Internal Medicine

## 2013-09-13 NOTE — Telephone Encounter (Signed)
Patient is c/o chest pain and reflux.  She will come in and see Mike Gip PA on 09/22/13.  She has an appt with pulmonary MD the same day and wants to wait until then to be seen here

## 2013-09-15 ENCOUNTER — Ambulatory Visit (HOSPITAL_COMMUNITY)
Admission: RE | Admit: 2013-09-15 | Discharge: 2013-09-15 | Disposition: A | Discharge status: Home / Self Care | Payer: Medicaid Other | Source: Ambulatory Visit | Attending: Family Medicine | Admitting: Family Medicine

## 2013-09-15 DIAGNOSIS — R29898 Other symptoms and signs involving the musculoskeletal system: Secondary | ICD-10-CM | POA: Insufficient documentation

## 2013-09-15 DIAGNOSIS — M545 Low back pain, unspecified: Secondary | ICD-10-CM

## 2013-09-15 DIAGNOSIS — M47817 Spondylosis without myelopathy or radiculopathy, lumbosacral region: Secondary | ICD-10-CM | POA: Insufficient documentation

## 2013-09-22 ENCOUNTER — Encounter (INDEPENDENT_AMBULATORY_CARE_PROVIDER_SITE_OTHER): Payer: Self-pay

## 2013-09-22 ENCOUNTER — Encounter: Payer: Self-pay | Admitting: Internal Medicine

## 2013-09-22 ENCOUNTER — Encounter: Payer: Self-pay | Admitting: Physician Assistant

## 2013-09-22 ENCOUNTER — Ambulatory Visit (INDEPENDENT_AMBULATORY_CARE_PROVIDER_SITE_OTHER): Payer: Medicaid Other | Admitting: Physician Assistant

## 2013-09-22 ENCOUNTER — Ambulatory Visit (INDEPENDENT_AMBULATORY_CARE_PROVIDER_SITE_OTHER): Payer: Medicaid Other | Admitting: Internal Medicine

## 2013-09-22 VITALS — BP 122/90 | HR 79 | Temp 97.7°F | Ht 62.0 in | Wt 300.2 lb

## 2013-09-22 VITALS — BP 122/72 | HR 80 | Ht 62.0 in | Wt 298.0 lb

## 2013-09-22 DIAGNOSIS — G4733 Obstructive sleep apnea (adult) (pediatric): Secondary | ICD-10-CM

## 2013-09-22 DIAGNOSIS — R0602 Shortness of breath: Secondary | ICD-10-CM

## 2013-09-22 DIAGNOSIS — Z23 Encounter for immunization: Secondary | ICD-10-CM

## 2013-09-22 DIAGNOSIS — R1013 Epigastric pain: Secondary | ICD-10-CM

## 2013-09-22 DIAGNOSIS — J45909 Unspecified asthma, uncomplicated: Secondary | ICD-10-CM

## 2013-09-22 DIAGNOSIS — K219 Gastro-esophageal reflux disease without esophagitis: Secondary | ICD-10-CM

## 2013-09-22 MED ORDER — BISOPROLOL FUMARATE 5 MG PO TABS: 5.0000 mg | ORAL_TABLET | Freq: Every day | ORAL | Status: DC

## 2013-09-22 MED ORDER — INFLUENZA VAC SPLIT QUAD 0.5 ML IM SUSP: 0.5000 mL | Freq: Once | INTRAMUSCULAR | Status: DC

## 2013-09-22 MED ORDER — PANTOPRAZOLE SODIUM 40 MG PO TBEC: 40.0000 mg | DELAYED_RELEASE_TABLET | Freq: Two times a day (BID) | ORAL | Status: DC

## 2013-09-22 NOTE — Patient Instructions (Addendum)
Please see patient coordinator before you leave today  to schedule autoset cpap 5-20 x 2 weeks with download  Stop all inhalers, if the diagnosis of asthma remains in doubt call [phone] and ask for [facility] to schedule a methacholine challenge  Please schedule a follow up office visit in 4 weeks, sooner if needed with Dr Gwenette Greet

## 2013-09-22 NOTE — Patient Instructions (Signed)
You will get a call from [facility] about a consult appointment.  We sent a new presccription for Protonix twice daily to [facility].  You have been scheduled for an endoscopy with propofol. Please follow written instructions given to you at your visit today. This procedure will be done at [facility], 1st floor. Go to registration.

## 2013-09-22 NOTE — Progress Notes (Signed)
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 Related to passive smoke exp but no problem with breathing as adult until 2006 much worse since May 2012 so referred 01/29/2012 by Dr Delphina Cahill and subsequently switched to Dr Anastasio Champion for primary care   HPI 01/29/2012 1st pulmonary office eval cc intermittent  sob x 6-7 years and transiently better x months s inhalers  persistent daily symptoms  since May 2012 progressed to point where can only walk  50 ft gives out.  Baseline wt = 200 back before breathing problems with freq steroid back injections and now on prednisone daily since May 1st of 2012 (but 9 x cycles x one year) despite ICS with marked transient sob  improvement > prior to May 2012 weight was 260-270 could walk a 5 k and now  breathing stops her walking before back. Also lots of cough esp at hs mostly dry so severe vomits, otherwise no overt hb symptoms.  She does have chronic non-variable sense of assoc nasal congestion doesn't get much better with steroids and chronic leg swellling, also not correlating with sob. SOB some better with freq daytime saba. rec Work on inhaler technique:  Try dulera 200 Take 2 puffs first thing in am and then another 2 puffs about 12 hours later.  Change dexilant 60 mg Take 30-60 min before first meal of the day  Pepcid 20mg  one at bedtime  Take delsym two tsp every 12 hours and supplement if needed with  tramadol 50 mg up to 1- 2 every 4 hours to suppress the urge to cough.  Once you have eliminated the cough for 3 straight days try reducing the tramadol first,  then the delsym as tolerated.   GERD  diet Prednisone 10 mg with breakfast until better then one each am until gone    02/13/2012 f/u ov/Kristina [name] cc cough so hard vomiting and ended up at [facility] 02/12/12 > 50% better on day of ov s need for saba p pred  increased to 40 mg and nebs in er with "wheezing heard across the room" no purulent sputum or overt hb/ sinus complaints. rec Take delsym two tsp every 12 hours and supplement if needed with  tramadol 50 mg up to 2 every 4 hours to suppress the urge to cough.  Once you have totally eliminated the cough and throat clearing 100% for 3 straight days try reducing the tramadol first,  then the delsym as tolerated.   Work on Gaffer:    Prednisone 20mg  x 3 days, 10 mg x 3 days then 5 mg x 3 days and stop Please schedule a follow up office visit in 2 weeks, sooner if needed  With all active medications in hand   03/02/2012 f/u ov/Kristina [name] cc only does well on 60 mg of prednisone then below 40 flares esp cough and sob p stir in am mostly dry until eventually gags/ vomits.   off prednisone since 6/13 but note even on 60 mg per day cc her cough never resolved, had been on reglan in past. Worried can't do job due to cough and sob because has to walk 50 ft to [employer]. No purulent sputum. No overt hb. Following diet x drinking lots of pepsi >rx reglan , d/c norvasc rx bystolic   [id number] Follow up  Returns for a follow up office visit.  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  Has been undergoing evaluation of chronic cough and dyspnea that has been present for ~7 years  But worse for last 1 year.  PCP has several tx regimens with Dulera, steroid packs.  Seen by Dr. Melvyn Novas  For initial consult on 5/15 tx w/ aggressive cough suppression regimen w/  Delsym and tramadol along with GERD prevention -PPI /Pepcid  Seen 2 weeks ago, started on reglan. And changed from norvasc to bystolic  Not tolerating Dulera or reglan.  Feels she may be slightly better but cough is not gone.  No wheezing or increased dyspnea off dulera.  No fever or chest pain .  CXR w/ no acute process.  No sinus drainage.  Does have OSA but not wearing CPAP due to cough.  Wants a referral to sleep specialist  Also wants referral  to Charlies Constable at [facility]  Seen in ER a few days ago for back pain , placed back on steroid taper.  rec Taper steroids as directed  Continue on Delsym 2 tsp every 12 hr  May use Tramadol 50mg  1-2 every 4 hr as needed for breakthrough cough .  Continue on Dexilant daily  Continue on Pepcid 20mg  At bedtime   Continue on Bystolic daily  GERD diet . Follow up Dr. Melvyn Novas  In 4 weeks  We are referring you one of our sleep specialist.  May stop Reglan and Dulera   05/07/2012 f/u ov/Kristina [name] cc missed appt  06/05/2012 f/u ov/Kristina [name] cc missed appt  08/11/2012 f/u ov/Kristina [name] cc patient states that cough  returned and worsened since prev ov. patient weight is down to 274lb from 301lb. started Pred 50mg  x 1 week for her back pain. Started Lisinopril 07/22/12 that seemed to be when the cough worsened, dry in nature. Inhaler not helping. rec Stop lisinopril Start benicar 40/25  One half each am until you see Dr Nevada Crane but would recommend you avoid ace inhibitors indefinitely Instead of prilosec take pantoprazole 40 mg Take 30-60 min before first meal of the day  And continue pepcid at bedtime. Use chlortrimeton 4mg  one every 6 hours as need for nasal drainage and stop zyrtec Keep appt to see Dr Glee Arvin at [facility] "never told me anything"    09/22/2013 f/u ov/Kristina [name] re: unexplained sob >> cough Chief Complaint  Patient presents with  . Follow-up    Pt last seen Nov 2013. She states her breathing has been worse over the past month. She gets SOB with or without any exertion such as taking a shower or walking accross the room.  Cough is not as severe as when she was seen last. Cough is non prod.   last rescue one week prior to OV   No symptoms sleeping of either cough or sob    No obvious daytime variabilty or assoc sob  cp or chest tightness, subjective wheeze overt sinus or hb symptoms. No unusual exp hx   Sleeping ok without nocturnal  or early am exacerbation  of respiratory  c/o's or need for noct saba. Also denies  any obvious fluctuation of symptoms with weather or environmental changes or other aggravating or alleviating factors except as outlined above  ROS  The following are not active complaints unless bolded sore throat, dysphagia, dental problems, itching, sneezing,  nasal congestion or excess/ purulent secretions, ear ache,   fever, chills, sweats, unintended wt loss, pleuritic or exertional cp, hemoptysis,  orthopnea pnd or leg swelling,  presyncope, palpitations, heartburn, abdominal pain, anorexia, nausea, vomiting, diarrhea  or change in bowel or urinary habits, change in stools or urine, dysuria,hematuria,  rash, arthralgias, visual complaints, headache, numbness weakness or ataxia or problems with walking or coordination,  change in mood/affect or memory.              Objective:   Physical Exam  amb obese wf with min pseudowheeze  Wt 322 01/29/2012 > 307 02/13/2012  > 301 03/02/2012 >297 03/16/2012  > 08/11/2012  274 > 09/22/2013 300  HEENT: nl dentition, turbinates, and orophanx. EAC clear    NECK :  without JVD/Nodes/TM/ nl carotid upstrokes bilaterally   LUNGS:  Completely clear bilaterally, no cough on insp or exp  CV:  RRR  no s3 or murmur or increase in P2, no edema   ABD:  soft and nontender with nl excursion in the supine position. No bruits or organomegaly, bowel sounds nl  MS:  warm without deformities, calf tenderness, cyanosis or clubbing              Assessment & Plan:

## 2013-09-23 ENCOUNTER — Telehealth: Payer: Self-pay | Admitting: *Deleted

## 2013-09-23 ENCOUNTER — Encounter: Payer: Self-pay | Admitting: Physician Assistant

## 2013-09-23 ENCOUNTER — Encounter: Payer: Self-pay | Admitting: Internal Medicine

## 2013-09-23 NOTE — Progress Notes (Signed)
Subjective:    Patient ID: Kristina [name], female    DOB: August 24, 1971, 43 y.o.   MRN: [id number]  HPI  Kristina [name] is a 43 year old white female known previously to Dr. Carlean Purl who had been seen remotely in 2008 for an upper endoscopy which was done for nausea and epigastric pain. She was found to have severe nonerosive gastritis and mild inflammatory changes at the GE junction. H. Pylori biopsies were done and these were negative .Other medical problems include morbid obesity, diabetes mellitus, hypertension, COPD, sleep apnea and chronic back pain. She comes in today for evaluation of persistent acid reflux symptoms, chest pain ,epigastric pain, and cough. She also says she has had problems with shortness of breath recently and is also seeing Dr. Melvyn Novas today. Patient has been on Protonix 40 mg once daily over the past couple of years and also has been taking Pepcid fairly regularly at bedtime. She says despite that she has very frequent burning and tightness in her chest along with burping and belching. He says she has frequent cough and sometimes coughs until she actually vomits. She also does have some IBS type symptoms with urgency for bowel movement bloating and gas. She says she can't state nauseated and is very frustrated. She says she knows that most of her problems would be improved with weight loss but her weight loss has been complicated by loss of [name], depression, chronic back pain which limits her activity. She says if she stands for long periods of time she tends to get burning discomfort down into the front of her legs bilaterally and a drawing sensation in her hips. If she's able to sit or change position this is relieved. She does take NSAIDs on a regular basis and has been taking at least 800 mg of ibuprofen every day sometimes more. She is trying to get motivated for weight loss at this time, states that she had noted Weight Watchers at one point in the past and was able to lose about 40  pounds but she says it seems as if she's always losing the same 40 pounds. She did join [employer]  Earlier this week with her daughter.    Review of Systems  Constitutional: Negative.   HENT: Negative.   Eyes: Negative.   Respiratory: Positive for cough.   Cardiovascular: Positive for chest pain.  Gastrointestinal: Positive for abdominal pain.  Endocrine: Negative.   Musculoskeletal: Positive for arthralgias and back pain.  Skin: Negative.   Allergic/Immunologic: Negative.   Neurological: Negative.   Hematological: Negative.   Psychiatric/Behavioral: Positive for dysphoric mood.   Outpatient Prescriptions Prior to Visit  Medication Sig Dispense Refill  . ALPRAZolam (XANAX) 0.25 MG tablet Take 1 tablet by mouth Every 8 hours as needed. FOR ANXIETY      . bisoprolol (ZEBETA) 5 MG tablet Take 1 tablet (5 mg total) by mouth at bedtime.  30 tablet  11  . diazepam (VALIUM) 5 MG tablet Take 5 mg by mouth 3 (three) times daily as needed. 1 po tid for spasm      . famotidine (PEPCID) 20 MG tablet TAKE ONE TABLET BY MOUTH EVERY DAY AT BEDTIME  30 tablet  5  . HYDROcodone-acetaminophen (NORCO) 5-325 MG per tablet Take 2 tablets by mouth every 4 (four) hours as needed for pain.  20 tablet  0  . ibuprofen (ADVIL,MOTRIN) 200 MG tablet Take 800 mg by mouth daily as needed for pain.       [name] levothyroxine (SYNTHROID,  LEVOTHROID) 50 MCG tablet Take 50 mcg by mouth daily.      . mometasone (NASONEX) 50 MCG/ACT nasal spray Place 2 sprays into the nose daily.      . Naproxen Sodium (ALEVE) 220 MG CAPS Take 220 mg by mouth once as needed (for pain).      . ondansetron (ZOFRAN) 4 MG tablet Take 1 tablet by mouth Every 4 hours as needed. nausea      . pantoprazole (PROTONIX) 40 MG tablet Take 40 mg by mouth every morning. Take 30-60 min before first meal of the day      . famotidine (PEPCID) 20 MG tablet Take 20 mg by mouth at bedtime. One at bedtime      . albuterol (VENTOLIN HFA) 108 (90 BASE) MCG/ACT inhaler  Inhale 2 puffs into the lungs every 6 (six) hours as needed. Shortness of breath      . azithromycin (ZITHROMAX) 250 MG tablet Take 1 tablet (250 mg total) by mouth daily. Take first 2 tablets together, then 1 every day until finished.  6 tablet  0  . bisoprolol (ZEBETA) 5 MG tablet Take 5 mg by mouth at bedtime.      [name] dextromethorphan (DELSYM) 30 MG/5ML liquid Take 60 mg by mouth 2 (two) times daily as needed for cough.      . mometasone-formoterol (DULERA) 200-5 MCG/ACT AERO Inhale 2 puffs into the lungs 2 (two) times daily.      [name] olmesartan-hydrochlorothiazide (BENICAR HCT) 40-25 MG per tablet Take 0.5 tablets by mouth every morning.       . pantoprazole (PROTONIX) 40 MG tablet TAKE ONE TABLET BY MOUTH ONCE DAILY 30 TO 60 MINUTES BEFORE FIRST MEAL OF THE DAY  30 tablet  0  . predniSONE (DELTASONE) 10 MG tablet Take 2 tablets (20 mg total) by mouth 2 (two) times daily.  20 tablet  0   No facility-administered medications prior to visit.   Allergies  Allergen Reactions  . Ampicillin Anaphylaxis  . Codeine Hives and Shortness Of Breath  . Guaifenesin & Derivatives   . Sulfonamide Derivatives Rash   Patient Active Problem List   Diagnosis Date Noted  . OSA (obstructive sleep apnea) 03/16/2012  . Cough 01/30/2012  . Asthma 01/30/2012  . PALPITATIONS 08/15/2008  . DYSPNEA 08/15/2008  . CHEST PAIN UNSPECIFIED 08/15/2008  . IRRITABLE BOWEL SYNDROME 02/26/2008  . DYSLIPIDEMIA 01/04/2008  . OBESITY 01/04/2008  . ANXIETY 01/04/2008  . DEPRESSION 01/04/2008  . HYPERTENSION 01/04/2008  . GERD 01/04/2008  . FATIGUE 01/04/2008  . HEADACHE, CHRONIC 01/04/2008  . GASTRITIS 04/02/2007   History  Substance Use Topics  . Smoking status: Never Smoker   . Smokeless tobacco: Never Used  . Alcohol Use: No   family history includes Allergies in her child and mother; Asthma in her brother, brother, mother, and son; Emphysema in her mother; Heart disease in her father and mother; Lung cancer in  her maternal aunt; Ovarian cancer in her mother.      Objective:   Physical Exam  Well-developed morbidly Obese white female in no acute distress, pleasant accompanied by her daughter. Blood pressure 122/72 pulse 80 height 5 foot 2 weight 298 BMI of 54.5. HEENT; nontraumatic normocephalic EOMI PERRLA sclera anicteric, Supple; no JVD, Cardiovascular regular rate and rhythm with S1-S2 no murmur or gallop Pulmonary; clear bilaterally somewhat decreased breath sounds, Abdomen; morbidly obese bowel sounds are present no palpable mass or hepatosplenomegaly minimal epigastric tenderness no guarding or rebound, Rectal; exam not  done, Extremities; no clubbing cyanosis or edema skin warm and dry, Psych; mood and affect appropriate        Assessment & Plan:  #68 43 year old morbidly obese female with a BMI of 54 with complaints of burning chest discomfort associated with belching burping nausea epigastric discomfort and frequent cough. Certainly a lot of her symptoms may be secondary to ongoing acid reflux which is no doubt exacerbated by her morbid obesity. She also may have an NSAID-induced gastropathy or ulcer with daily NSAID use.  #2 asthma question COPD #3 sleep apnea #4 diabetes mellitus #5 hypertension #6 IBS #7 anxiety/depression  Plan; increase Protonix to 40 mg twice daily before breakfast and before dinner Strict antireflux regimen was reviewed and she was given literature as well to include n.p.o. For  3 hours before bedtime and elevation of the head of the bed She is encouraged to try to stop the daily use of NSAIDs We had a long discussion regarding weight loss,she is aware that most of her medical problems are being caused by morbid obesity. She does see motivated to try to lose weight. She was commended for joining [employer]. He also discussed redoing Weight Watchers and setting short-term attainable goals. We'll also refer her to [place] [facility] for consideration of gastric  bypass or other obesity surgery. Patient is very concerned about her epigastric pain and chest pain and will proceed with EGD with Dr. Carlean Purl. Procedure discussed in detail with patient she is agreeable to proceed

## 2013-09-23 NOTE — Telephone Encounter (Signed)
I called Aviva Signs, New Patient Coordinator at [facility] and left a message for her.  I advised her this patient would be a new patient with [facility]. We are referring her for Gastric Bypass consult.  I left my name and number for Thayer Headings.

## 2013-09-23 NOTE — Assessment & Plan Note (Signed)
-   Spirometry wnl 03/06/11    - HFA  75% 01/29/12 -referred to Dr. Charlies Constable at [facility] per pt request 03/16/2012  - nl spirometry 09/22/13 off inhalers x one week   Strongly doubt this is asthma > needs methacholine challenge test if dx remains in doubt and continue off all inhalers  for now

## 2013-09-23 NOTE — Assessment & Plan Note (Signed)
-   Spirometry nl 02/13/2012 x for voluntary portion truncation, non-physiologic   - 02/13/2012  Walked RA x 3 laps @ 185 ft each stopped due to  End of study, no desat   - 03/02/2012  Walked RA x 3 laps @ 185 ft each stopped due to  End of study, no desat - 09/22/2013  Walked RA x 3 laps @ 185 ft each stopped due to  End of study, no sob or desat   Symptoms are markedly disproportionate to objective findings and not clear this is a lung problem but pt does appear to have difficult airway management issues. DDX of  difficult airways managment all start with A and  include Adherence, Ace Inhibitors, Acid Reflux, Active Sinus Disease, Alpha 1 Antitripsin deficiency, Anxiety masquerading as Airways dz,  ABPA,  allergy(esp in young), Aspiration (esp in elderly), Adverse effects of DPI,  Active smokers, plus two Bs  = Bronchiectasis and Beta blocker use..and one C= CHF  ? Acid (or non-acid) GERD > always difficult to exclude as up to 75% of pts in some series report no assoc GI/ Heartburn symptoms> rec referred to GI  ? Anxiety> typically a dx of exclusion but note consistent absence of objective findings

## 2013-09-23 NOTE — Telephone Encounter (Signed)
I got a call from Aviva Signs at [facility].  She told me to call the patient to inform her to call [facility] at [phone] and schedule a time for her to attend a Weight loss Serminar. She can schedule this when it suites her. They have various times for this class. I left a message for Kristina [name].

## 2013-09-23 NOTE — Progress Notes (Signed)
Agree with [name] assessment and plan.  Needs to have EGD at hospital due to BMI > 50  Unless she has had problems with moderate sedation in past she does not need MAC [name], MD, [facility]

## 2013-09-24 ENCOUNTER — Other Ambulatory Visit: Payer: Self-pay | Admitting: Internal Medicine

## 2013-09-24 ENCOUNTER — Telehealth: Payer: Self-pay | Admitting: Internal Medicine

## 2013-09-24 DIAGNOSIS — G4733 Obstructive sleep apnea (adult) (pediatric): Secondary | ICD-10-CM

## 2013-09-24 MED ORDER — METOPROLOL TARTRATE 50 MG PO TABS
50.0000 mg | ORAL_TABLET | Freq: Two times a day (BID) | ORAL | Status: DC
Start: 2013-09-24 — End: 2014-05-04

## 2013-09-24 NOTE — Telephone Encounter (Signed)
Per Dr Melvyn Novas, bisoprolol not covered per ins so need to change to metoprolol 50 mg bid  Pt aware and okay with this change and so rx was sent to pharm

## 2013-10-19 ENCOUNTER — Telehealth: Payer: Self-pay | Admitting: Pulmonary Disease

## 2013-10-19 DIAGNOSIS — G4733 Obstructive sleep apnea (adult) (pediatric): Secondary | ICD-10-CM

## 2013-10-19 NOTE — Telephone Encounter (Signed)
LMOM x 1 

## 2013-10-20 ENCOUNTER — Ambulatory Visit: Payer: Medicaid Other | Admitting: Pulmonary Disease

## 2013-10-22 NOTE — Telephone Encounter (Signed)
Spoke with the pt and she states that when she saw MW in Jan he advised her that he wanted cpap set on auto x 2 weeks with download and the ov with [place]. I do not see where an order was placed. Pt states she spoke with [facility] and was advised that her machine does not do auto. I advised I will call [facility] to see what the issues are.   Spoke with [facility] and they never received an order for auto. I have placed the order.   ATC the pt and advise but line is busy. WCB. Pt needs ov with [place] after download. Sherrill Bing, CMA

## 2013-10-25 NOTE — Telephone Encounter (Signed)
[email or web address] to advise pt that order was not sent at last OV so we have placed this. She will need OV with [place] after 2 week download. Sikes Bing, CMA

## 2013-10-26 NOTE — Telephone Encounter (Signed)
LMTCBx2. Teddie Mehta, CMA  

## 2013-10-26 NOTE — Telephone Encounter (Signed)
Called and spoke with pt. She is getting a Materials engineer from [facility]. Nothing further needed

## 2013-10-27 ENCOUNTER — Ambulatory Visit (HOSPITAL_COMMUNITY)
Admission: RE | Admit: 2013-10-27 | Discharge: 2013-10-27 | Disposition: A | Discharge status: Home / Self Care | Payer: Medicaid Other | Source: Ambulatory Visit | Attending: Internal Medicine | Admitting: Internal Medicine

## 2013-10-27 ENCOUNTER — Encounter (HOSPITAL_COMMUNITY)
Admission: RE | Disposition: A | Discharge status: Home / Self Care | Payer: Self-pay | Source: Ambulatory Visit | Attending: Internal Medicine

## 2013-10-27 ENCOUNTER — Encounter (HOSPITAL_COMMUNITY): Payer: Self-pay

## 2013-10-27 ENCOUNTER — Other Ambulatory Visit: Payer: Self-pay | Admitting: Physician Assistant

## 2013-10-27 DIAGNOSIS — K219 Gastro-esophageal reflux disease without esophagitis: Secondary | ICD-10-CM | POA: Insufficient documentation

## 2013-10-27 DIAGNOSIS — Z79899 Other long term (current) drug therapy: Secondary | ICD-10-CM | POA: Insufficient documentation

## 2013-10-27 DIAGNOSIS — G4733 Obstructive sleep apnea (adult) (pediatric): Secondary | ICD-10-CM | POA: Insufficient documentation

## 2013-10-27 DIAGNOSIS — J449 Chronic obstructive pulmonary disease, unspecified: Secondary | ICD-10-CM | POA: Insufficient documentation

## 2013-10-27 DIAGNOSIS — K2940 Chronic atrophic gastritis without bleeding: Secondary | ICD-10-CM | POA: Insufficient documentation

## 2013-10-27 DIAGNOSIS — K319 Disease of stomach and duodenum, unspecified: Secondary | ICD-10-CM | POA: Insufficient documentation

## 2013-10-27 DIAGNOSIS — K2970 Gastritis, unspecified, without bleeding: Secondary | ICD-10-CM

## 2013-10-27 DIAGNOSIS — K2990 Gastroduodenitis, unspecified, without bleeding: Secondary | ICD-10-CM

## 2013-10-27 DIAGNOSIS — E119 Type 2 diabetes mellitus without complications: Secondary | ICD-10-CM | POA: Insufficient documentation

## 2013-10-27 DIAGNOSIS — I10 Essential (primary) hypertension: Secondary | ICD-10-CM | POA: Insufficient documentation

## 2013-10-27 DIAGNOSIS — E079 Disorder of thyroid, unspecified: Secondary | ICD-10-CM | POA: Insufficient documentation

## 2013-10-27 DIAGNOSIS — R1013 Epigastric pain: Secondary | ICD-10-CM

## 2013-10-27 DIAGNOSIS — J4489 Other specified chronic obstructive pulmonary disease: Secondary | ICD-10-CM | POA: Insufficient documentation

## 2013-10-27 HISTORY — PX: ESOPHAGOGASTRODUODENOSCOPY: SHX5428

## 2013-10-27 LAB — GLUCOSE, CAPILLARY: GLUCOSE-CAPILLARY: 103 mg/dL — AB (ref 70–99)

## 2013-10-27 SURGERY — EGD (ESOPHAGOGASTRODUODENOSCOPY)
Anesthesia: Moderate Sedation

## 2013-10-27 MED ORDER — MIDAZOLAM HCL 10 MG/2ML IJ SOLN
INTRAMUSCULAR | Status: DC | PRN
  Administered 2013-10-27: 1 mg via INTRAVENOUS
  Administered 2013-10-27: 2 mg via INTRAVENOUS
  Administered 2013-10-27: 1 mg via INTRAVENOUS
  Administered 2013-10-27 (×2): 2 mg via INTRAVENOUS

## 2013-10-27 MED ORDER — FENTANYL CITRATE 0.05 MG/ML IJ SOLN
INTRAMUSCULAR | Status: AC
  Filled 2013-10-27: qty 2

## 2013-10-27 MED ORDER — MIDAZOLAM HCL 10 MG/2ML IJ SOLN
INTRAMUSCULAR | Status: AC
  Filled 2013-10-27: qty 2

## 2013-10-27 MED ORDER — DIPHENHYDRAMINE HCL 50 MG/ML IJ SOLN
INTRAMUSCULAR | Status: DC | PRN
  Administered 2013-10-27: 25 mg via INTRAVENOUS

## 2013-10-27 MED ORDER — DIPHENHYDRAMINE HCL 50 MG/ML IJ SOLN
INTRAMUSCULAR | Status: AC
  Filled 2013-10-27: qty 1

## 2013-10-27 MED ORDER — ONDANSETRON HCL 4 MG/2ML IJ SOLN
4.0000 mg | Freq: Once | INTRAMUSCULAR | Status: AC
  Administered 2013-10-27: 4 mg via INTRAVENOUS

## 2013-10-27 MED ORDER — BUTAMBEN-TETRACAINE-BENZOCAINE 2-2-14 % EX AERO
INHALATION_SPRAY | CUTANEOUS | Status: DC | PRN
  Administered 2013-10-27: 2 via TOPICAL

## 2013-10-27 MED ORDER — ONDANSETRON HCL 4 MG/2ML IJ SOLN
INTRAMUSCULAR | Status: AC
  Filled 2013-10-27: qty 2

## 2013-10-27 MED ORDER — SODIUM CHLORIDE 0.9 % IV SOLN
INTRAVENOUS | Status: DC
  Administered 2013-10-27 (×2): 500 mL via INTRAVENOUS

## 2013-10-27 MED ORDER — FENTANYL CITRATE 0.05 MG/ML IJ SOLN
INTRAMUSCULAR | Status: DC | PRN
  Administered 2013-10-27 (×3): 25 ug via INTRAVENOUS

## 2013-10-27 NOTE — Op Note (Signed)
[facility] [place], [address]   ENDOSCOPY PROCEDURE REPORT  PATIENT: Kristina, [name]  MR#: [id number] BIRTHDATE: 03/23/1971 , 42  yrs. old GENDER: Female ENDOSCOPIST: [name], MD, [facility] PROCEDURE DATE:  10/27/2013 PROCEDURE:  EGD w/ biopsy ASA CLASS:     Class III INDICATIONS:  Epigastric pain. MEDICATIONS: Fentanyl 75 mcg IV, Versed 8 mg IV, and Benadryl 25 mg IV TOPICAL ANESTHETIC: Cetacaine Spray  DESCRIPTION OF PROCEDURE: After the risks benefits and alternatives of the procedure were thoroughly explained, informed consent was obtained.  The    endoscope was introduced through the mouth and advanced to the second portion of the duodenum. Without limitations.  The instrument was slowly withdrawn as the mucosa was fully examined.        STOMACH: Mild gastropathy was found in the gastric antrum.  Multiple biopsies were performed using cold forceps.  Sample sent for histology.  The remainder of the upper endoscopy exam was otherwise normal, Z line slightly irregular but did not note columnar change extension into esophagus. Retroflexed views revealed no abnormalities. The scope was then withdrawn from the patient and the procedure completed.  COMPLICATIONS: There were no complications. ENDOSCOPIC IMPRESSION: 1.   Gastropathy was found in the gastric antrum; multiple biopsies 2.   The remainder of the upper endoscopy exam was otherwise normal  RECOMMENDATIONS: 1.  Office will call with results 2.   I think pain could be diabetic neuropathy/functional dyspepsia regardless I think it could be beneficial for her to schnage from SSRI to duloxetine therapy through PCP weight loss is key also   eSigned:  [name], MD, [facility] 10/27/2013 10:34 AM   CC:The Patient  and Leana Roe, MD

## 2013-10-27 NOTE — Discharge Instructions (Addendum)
The stomach looks irritated but no ulcers or anything bad seen. Hard to know if this is related to your pain. i did take biopsies and will let you know what they show and make recommendations then.  [name], MD, FACG  YOU HAD AN ENDOSCOPIC PROCEDURE TODAY: Refer to the procedure report and other information in the discharge instructions given to you for any specific questions about what was found during the examination. If this information does not answer your questions, please call Dr. Celesta Aver office at [phone] to clarify.   YOU SHOULD EXPECT: Some feelings of bloating in the abdomen. Passage of more gas than usual. Walking can help get rid of the air that was put into your GI tract during the procedure and reduce the bloating. If you had a lower endoscopy (such as a colonoscopy or flexible sigmoidoscopy) you may notice spotting of blood in your stool or on the toilet paper. Some abdominal soreness may be present for a day or two, also.  DIET: Your first meal following the procedure should be a light meal and then it is ok to progress to your normal diet. A half-sandwich or bowl of soup is an example of a good first meal. Heavy or fried foods are harder to digest and may make you feel nauseous or bloated. Drink plenty of fluids but you should avoid alcoholic beverages for 24 hours.   ACTIVITY: Your care partner should take you home directly after the procedure. You should plan to take it easy, moving slowly for the rest of the day. You can resume normal activity the day after the procedure however YOU SHOULD NOT DRIVE, use power tools, machinery or perform tasks that involve climbing or major physical exertion for 24 hours (because of the sedation medicines used during the test).   SYMPTOMS TO REPORT IMMEDIATELY: A gastroenterologist can be reached at any hour. Please call [phone]  for any of the following symptoms:  Following upper endoscopy (EGD, EUS, ERCP, esophageal  dilation) Vomiting of blood or coffee ground material  New, significant abdominal pain  New, significant chest pain or pain under the shoulder blades  Painful or persistently difficult swallowing  New shortness of breath  Black, tarry-looking or red, bloody stools  FOLLOW UP:  If any biopsies were taken you will be contacted by phone or by letter within the next 1-3 weeks. Call [phone]  if you have not heard about the biopsies in 3 weeks.  Please also call with any specific questions about appointments or follow up tests.  Esophagogastroduodenoscopy Care After Refer to this sheet in the next few weeks. These instructions provide you with information on caring for yourself after your procedure. Your caregiver may also give you more specific instructions. Your treatment has been planned according to current medical practices, but problems sometimes occur. Call your caregiver if you have any problems or questions after your procedure.  HOME CARE INSTRUCTIONS  Do not eat or drink anything until the numbing medicine (local anesthetic) has worn off and your gag reflex has returned. You will know that the local anesthetic has worn off when you can swallow comfortably.  Do not drive for 12 hours after the procedure or as directed by your caregiver.  Only take medicines as directed by your caregiver. SEEK MEDICAL CARE IF:   You cannot stop coughing.  You are not urinating at all or less than usual. SEEK IMMEDIATE MEDICAL CARE IF:  You have difficulty swallowing.  You cannot eat or drink.  You have worsening throat or chest pain.  You have dizziness, lightheadedness, or you faint.  You have nausea or vomiting.  You have chills.  You have a fever.  You have severe abdominal pain.  You have black, tarry, or bloody stools. Document Released: 08/19/2012 Document Reviewed: 08/19/2012 [facility] Patient Information 2014 [place], [place].

## 2013-10-27 NOTE — H&P (Signed)
[facility] History and Physical   Primary Care Physician:  Doree Albee, MD   Reason for Procedure:  Epigastric pain despite PPI  Plan:    EGD The risks and benefits as well as alternatives of endoscopic procedure(s) have been discussed and reviewed. All questions answered. The patient agrees to proceed.  HPI: Kristina [name] is a 43 y.o. female seen 09/22/2013 in my offic by Nicoletta Ba, PA-C. She has had epigastric pain and reflux despite daily PPI. Had PPI increased to bid with some benefit 1/7. Pain intermittent and in setting of NSAID use for back pain. Some nausea, no vomiting. No clear triggers for pain like food, etc.   Past Medical History  Diagnosis Date  . Hypertension   . Diabetes mellitus   . Asthma   . COPD (chronic obstructive pulmonary disease)   . Restless leg   . High cholesterol   . Bulging disc   . Thyroid disease   . Obstructive sleep apnea     uses c-pap at night    Past Surgical History  Procedure Laterality Date  . Abdominal hysterectomy    . Cholecystectomy    . Knee surgery  2011    right  . Nose surgery      Prior to Admission medications   Medication Sig Start Date End Date Taking? Authorizing Provider  ALPRAZolam (XANAX) 0.25 MG tablet Take 1 tablet by mouth Every 8 hours as needed. FOR ANXIETY   Yes Historical Provider, MD  diazepam (VALIUM) 5 MG tablet Take 5 mg by mouth 3 (three) times daily as needed. 1 po tid for spasm 03/13/12  Yes Lenox Ahr, PA-C  estradiol (ESTRADERM) 0.05 MG/24HR patch Place 1 patch onto the skin 2 (two) times a week.   Yes Historical Provider, MD  FLUoxetine (PROZAC) 20 MG capsule Take 20 mg by mouth daily.   Yes Historical Provider, MD  HYDROcodone-acetaminophen (NORCO) 5-325 MG per tablet Take 2 tablets by mouth every 4 (four) hours as needed for pain. 11/23/12  Yes Veryl Speak, MD  ibuprofen (ADVIL,MOTRIN) 200 MG tablet Take 800 mg by mouth daily as needed for pain.    Yes Historical  Provider, MD  levothyroxine (SYNTHROID, LEVOTHROID) 50 MCG tablet Take 50 mcg by mouth daily.   Yes Historical Provider, MD  metoprolol (LOPRESSOR) 50 MG tablet Take 1 tablet (50 mg total) by mouth 2 (two) times daily. 09/24/13  Yes Tanda Rockers, MD  mometasone (NASONEX) 50 MCG/ACT nasal spray Place 2 sprays into the nose daily.   Yes Historical Provider, MD  Naproxen Sodium (ALEVE) 220 MG CAPS Take 220 mg by mouth once as needed (for pain).   Yes Historical Provider, MD  pantoprazole (PROTONIX) 40 MG tablet Take 1 tablet (40 mg total) by mouth 2 (two) times daily. Take 30-60 min before first meal of the day 09/22/13  Yes Amy S Esterwood, PA-C  progesterone (PROMETRIUM) 100 MG capsule Take 100 mg by mouth daily.   Yes Historical Provider, MD  famotidine (PEPCID) 20 MG tablet Take 20 mg by mouth at bedtime. One at bedtime 01/29/12 01/28/13  Tanda Rockers, MD  famotidine (PEPCID) 20 MG tablet TAKE ONE TABLET BY MOUTH EVERY DAY AT BEDTIME 02/20/13   Tanda Rockers, MD  ondansetron (ZOFRAN) 4 MG tablet Take 1 tablet by mouth Every 4 hours as needed. nausea    Historical Provider, MD    Current Facility-Administered Medications  Medication Dose Route Frequency Provider Last Rate Last Dose  .  0.9 %  sodium chloride infusion   Intravenous Continuous [name], MD 20 mL/hr at 10/27/13 0945 500 mL at 10/27/13 0945    Allergies as of 09/22/2013 - Review Complete 09/22/2013  Allergen Reaction Noted  . Ampicillin Anaphylaxis 02/02/2008  . Codeine Hives and Shortness Of Breath 02/02/2008  . Guaifenesin & derivatives  07/24/2013  . Sulfonamide derivatives Rash 02/02/2008    Family History  Problem Relation Age of Onset  . Emphysema Mother   . Allergies Mother   . Allergies Child   . Heart disease Father     deceased at age 52 from heart attack  . Heart disease Mother   . Asthma Son   . Asthma Brother   . Asthma Brother   . Asthma Mother   . Ovarian cancer Mother   . Lung cancer Maternal Aunt      History   Social History  . Marital Status: Divorced    Spouse Name: N/A    Number of Children: 3  . Years of Education: N/A   Occupational History  . unemployed    Social History Main Topics  . Smoking status: Never Smoker   . Smokeless tobacco: Never Used  . Alcohol Use: No  . Drug Use: No  . Sexual Activity: Yes    Birth Control/ Protection: Surgical   Review of Systems: Positive for back pain, weight gain All other review of systems negative except as mentioned in the HPI.  Physical Exam: Vital signs in last 24 hours: Temp:  [97.9 F (36.6 C)] 97.9 F (36.6 C) (02/11 0949) Pulse Rate:  [73] 73 (02/11 0949) Resp:  [20] 20 (02/11 0949) BP: (140)/(92) 140/92 mmHg (02/11 0949) SpO2:  [95 %] 95 % (02/11 0949)   General:   Alert,  Well-developed, well-nourished, pleasant and cooperative in NAD - obese Lungs:  Clear throughout to auscultation.   Heart:  Regular rate and rhythm; no murmurs, clicks, rubs,  or gallops. Abdomen:  Soft, nontender and nondistended. Normal bowel sounds.   Neuro/Psych:  Alert and cooperative. Normal mood and affect. A and O x 3   @Desi Carby  Simonne Maffucci, MD, [facility] [phone] (pager) 10/27/2013 9:52 AM@

## 2013-10-28 ENCOUNTER — Encounter (HOSPITAL_COMMUNITY): Payer: Self-pay | Admitting: Internal Medicine

## 2013-10-28 NOTE — Progress Notes (Signed)
Quick Note:  Biopsies show gastropathy - not likely to be causing her pain - changes in stomach lining from meds, prior gastritis - cannot ID a med to stop, etc  1) See PCP and ask about changing to duloxetine (Cymbalta) as I have suggested in EGD report 2) She should investigate bariatric surgery options through [facility] - at least investigate and lear - weight loss could make all the difference for her in many ways 3) GI f/u prn -  4) CC this note/path and endo report to PCP ______

## 2013-11-11 ENCOUNTER — Ambulatory Visit: Payer: Medicaid Other | Admitting: Pulmonary Disease

## 2013-11-15 ENCOUNTER — Other Ambulatory Visit (HOSPITAL_COMMUNITY): Payer: Self-pay | Admitting: Internal Medicine

## 2013-11-15 ENCOUNTER — Ambulatory Visit (HOSPITAL_COMMUNITY)
Admission: RE | Admit: 2013-11-15 | Discharge: 2013-11-15 | Disposition: A | Discharge status: Home / Self Care | Payer: Medicaid Other | Source: Ambulatory Visit | Attending: Internal Medicine | Admitting: Internal Medicine

## 2013-11-15 DIAGNOSIS — M545 Low back pain, unspecified: Secondary | ICD-10-CM

## 2013-12-02 ENCOUNTER — Ambulatory Visit: Payer: Medicaid Other | Admitting: Orthopedic Surgery

## 2013-12-14 ENCOUNTER — Ambulatory Visit (INDEPENDENT_AMBULATORY_CARE_PROVIDER_SITE_OTHER): Payer: Medicaid Other | Admitting: Orthopedic Surgery

## 2013-12-14 ENCOUNTER — Telehealth: Payer: Self-pay | Admitting: *Deleted

## 2013-12-14 ENCOUNTER — Encounter: Payer: Self-pay | Admitting: Orthopedic Surgery

## 2013-12-14 VITALS — BP 124/75 | Ht 62.5 in | Wt 305.0 lb

## 2013-12-14 DIAGNOSIS — M47816 Spondylosis without myelopathy or radiculopathy, lumbar region: Secondary | ICD-10-CM

## 2013-12-14 DIAGNOSIS — M47817 Spondylosis without myelopathy or radiculopathy, lumbosacral region: Secondary | ICD-10-CM

## 2013-12-14 NOTE — Telephone Encounter (Signed)
Pt returned call.  Pt an appt w/ [facility] on 4/17 @ 12 Noon.  Satira Anis

## 2013-12-14 NOTE — Telephone Encounter (Signed)
LMOM x 1 Needs OV--f/u OSA

## 2013-12-14 NOTE — Patient Instructions (Signed)
Refer back to [facility]

## 2013-12-14 NOTE — Progress Notes (Signed)
Patient ID: Kristina [name], female   DOB: 1971/02/12, 43 y.o.   MRN: [id number]  Chief Complaint  Patient presents with  . Back Pain    Low back pain and right leg pain assocaiated with numbness and tingling in feet. Consult from Dr. Anastasio Champion    Consult requested  This is a 43 year old female with a 2-1/2 year history of lower back pain and progressively worsening right lower leg pain and progressively worsening numbness and tingling and decreased sensation in the right foot. Previous treatment includes oral steroids, 5 epidural injections chronic pain management, ibuprofen, Naprosyn, hydrocodone. She was last seen by [facility] in 2013 she's had a second MRI recently which showed lumbar spondylosis at L4-5 and L5 and S1. She complains of lower back pain in that area and has functional deficits of difficulty standing, washing dishes, putting on her shoes and socks. She has difficulty bending. She cannot stand for long periods of time cannot sit for long periods of time and has difficulty lying flat on her back. Her pain is now 8/10. Additional medications included Robaxin Valium. These were unsuccessful in alleviating her symptoms are getting her into a comfortable position. There was no history of injury.  Some of her symptoms aren't sharp and dull pain throbbing and stabbing pain which is intermittent and worse after walking or sitting for 1-2 hours.  Review of systems weight gain and fatigue blurred vision heart murmur shortness of breath wheezing COPD symptoms noted. Heartburn and nausea. She had a endoscopy which revealed that she should not be on anti-inflammatories. She has history of flank pain stiffness numbness tingling dizziness anxiety depression excessive thirst and seasonal allergies easy bleeding.  She is allergic to ampicillin sulfa and codeine  Past Medical History  Diagnosis Date  . Hypertension   . Diabetes mellitus   . Asthma   . COPD (chronic  obstructive pulmonary disease)   . Restless leg   . High cholesterol   . Bulging disc   . Thyroid disease   . Obstructive sleep apnea     uses c-pap at night  . Obesity    Past Surgical History  Procedure Laterality Date  . Abdominal hysterectomy    . Cholecystectomy    . Knee surgery  2011    right  . Nose surgery    . Esophagogastroduodenoscopy N/A 10/27/2013    Procedure: ESOPHAGOGASTRODUODENOSCOPY (EGD);  Surgeon: [name], MD;  Location: [facility];  Service: Endoscopy;  Laterality: N/A;   The past, family history and social history have been reviewed and are recorded in the corresponding sections of epic   VS BP 124/75  Ht 5' 2.5" (1.588 m)  Wt 305 lb (138.347 kg)  BMI 54.86 kg/m2  General and hygiene are normal. Development and nutrition are normal. Body habitus severe obesity Mood Affect are normal The patient is alert and oriented x3 Ambulatory status no assisted devices are needed but she walks with a flattened back posture  Lumbar spine reveals flexion to the point where her hands can reach the middle of her tibia she has pain with extension and tenderness in the lower portions of the lumbar spine without muscle tension. Skin is intact   RUE (include skin) Inspection reveals no tenderness or swelling,  range of motion is normal. The joints are located without subluxation or laxity. Motor exam reveals grade 5 strength and the skin is without rash lesion or ulcer  LUE Inspection reveals no tenderness or swelling,  range of motion  is normal. The joints are located without subluxation or laxity. Motor exam reveals grade 5 strength;  skin is without rash lesion or ulcer  RLE Inspection reveals no tenderness or swelling;  range of motion is normal. The joints are located without subluxation or laxity. Motor exam reveals grade 5 strength and the skin is without rash lesion or ulcer  LLE Inspection reveals no tenderness or swelling,  range of motion is normal.  The joints are located without subluxation or laxity. Motor exam reveals grade 5 strength and  the skin is without rash lesion or ulcer  CDV peripheral pulses are intact without swelling or varicose veins  LYMPH are normal in all 4 extremities with no palpable nodes  The most recent MRI showed the following L3-L4: Mild broad-based disc bulge. Moderate bilateral facet arthropathy. No evidence of neural foraminal stenosis. No central canal stenosis.   L4-L5: Mild broad-based disc bulge. Moderate bilateral facet arthropathy, left greater than right. No evidence of neural foraminal stenosis. No central canal stenosis.   L5-S1: No significant disc bulge. Mild bilateral facet arthropathy. No evidence of neural foraminal stenosis. No central canal stenosis.  DTR are equal and symmetric Balance  is normal Sensory evaluation reveals decreased pinprick in the right foot on the dorsum.  Encounter Diagnosis  Name Primary?  . Lumbar spondylosis Yes   The patient has obvious lumbar spondylosis not responding to conservative treatment and will need neurosurgical input. She is referred back to [facility] and Dr. Carloyn Manner if they do not see her

## 2013-12-15 ENCOUNTER — Telehealth: Payer: Self-pay | Admitting: *Deleted

## 2013-12-15 NOTE — Telephone Encounter (Signed)
I called Dr. Vickey Sages office, and spoke with Clarise Cruz. She stated she would make the referral to [facility], due to patient has [employer] and they are on her card. I advised I would fax our office notes to them.

## 2013-12-22 ENCOUNTER — Other Ambulatory Visit: Payer: Self-pay | Admitting: Physician Assistant

## 2013-12-22 ENCOUNTER — Telehealth: Payer: Self-pay | Admitting: Orthopedic Surgery

## 2013-12-22 NOTE — Telephone Encounter (Signed)
Sarah from Dr. Vickey Sages office, patient's primary care physician's office, called to request copy of patient's last office note of 12/14/13, to be re-faxed to them at [phone], as she states patient is in their office and they are unable to find the inititially faxed notes.  Done as per request.

## 2013-12-31 ENCOUNTER — Encounter: Payer: Self-pay | Admitting: Pulmonary Disease

## 2013-12-31 ENCOUNTER — Ambulatory Visit (INDEPENDENT_AMBULATORY_CARE_PROVIDER_SITE_OTHER): Payer: Medicaid Other | Admitting: Pulmonary Disease

## 2013-12-31 VITALS — BP 110/70 | HR 103 | Temp 97.4°F | Ht 62.0 in | Wt 310.6 lb

## 2013-12-31 DIAGNOSIS — G4733 Obstructive sleep apnea (adult) (pediatric): Secondary | ICD-10-CM

## 2013-12-31 NOTE — Patient Instructions (Signed)
Will have advanced work with you on mask fit. Will have your machine at home set on a fixed pressure of 14 Work on weight loss followup with me again in 56mos, but call if you are having issues with your cpap treatment.

## 2013-12-31 NOTE — Progress Notes (Signed)
   Subjective:    Patient ID: Kristina [name], female    DOB: [birth year], 43 y.o.   MRN: [id number]  HPI The patient comes in today for followup of her obstructive sleep apnea. She has worn an auto titrating device for a few weeks, and found to have an optimal pressure of 14 cm. She also had some increased leak with her current mask that was fitted at the sleep Center. She has not worn the CPAP recently because she does not like the fluctuating pressures. We will have her machine from home set on her optimal pressure.   Review of Systems  Constitutional: Negative for fever and unexpected weight change.  HENT: Negative for congestion, dental problem, ear pain, nosebleeds, postnasal drip, rhinorrhea, sinus pressure, sneezing, sore throat and trouble swallowing.   Eyes: Negative for redness and itching.  Respiratory: Negative for cough, chest tightness, shortness of breath and wheezing.   Cardiovascular: Negative for palpitations and leg swelling.  Gastrointestinal: Negative for nausea and vomiting.  Genitourinary: Negative for dysuria.  Musculoskeletal: Negative for joint swelling.  Skin: Negative for rash.  Neurological: Negative for headaches.  Hematological: Does not bruise/bleed easily.  Psychiatric/Behavioral: Negative for dysphoric mood. The patient is not nervous/anxious.        Objective:   Physical Exam Morbidly obese female in no acute distress Nose without purulence or discharge noted No skin breakdown or pressure necrosis from the CPAP Neck without lymphadenopathy or thyromegaly Lower extremities with mild edema, no cyanosis Alert and oriented, does not appear to be sleepy, moves all 4 extremities.       Assessment & Plan:

## 2013-12-31 NOTE — Assessment & Plan Note (Signed)
The patient has worn an auto titrating device which shows her optimal pressure to be 14cm. She did not like the auto device overall because of the sudden change in pressure that can occur during sleep with worsening events. I will have her machine at home set on the fixed pressure of 14, and see how things go. Will also have advanced to work with her on the mask fits since she is having leak issues from her recent download. Finally, I have encouraged her to work aggressively on weight loss.

## 2014-01-20 ENCOUNTER — Encounter: Payer: Self-pay | Admitting: Orthopedic Surgery

## 2014-01-20 ENCOUNTER — Ambulatory Visit (INDEPENDENT_AMBULATORY_CARE_PROVIDER_SITE_OTHER): Payer: Medicaid Other | Admitting: Orthopedic Surgery

## 2014-01-20 VITALS — BP 130/87 | Ht 62.0 in | Wt 310.0 lb

## 2014-01-20 DIAGNOSIS — G5600 Carpal tunnel syndrome, unspecified upper limb: Secondary | ICD-10-CM

## 2014-01-20 MED ORDER — GABAPENTIN 100 MG PO CAPS: 100.0000 mg | ORAL_CAPSULE | Freq: Three times a day (TID) | ORAL | Status: DC

## 2014-01-20 NOTE — Patient Instructions (Signed)
Start taking B6 100 mg twice a day Start Gabapentin Needs Nerve Conduction Study

## 2014-01-20 NOTE — Progress Notes (Signed)
Patient ID: Kristina [name], female   DOB: Feb 22, 1971, 43 y.o.   MRN: [id number] Established patient new problem   Chief Complaint  Patient presents with  . Hand Pain    Numbness in left hand and fingers. Referred by Dr. 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 Previous treatment includes bracing oral steroids and ibuprofen. She has reflux for as not to take ibuprofen. Steroids did help. She has not had nerve conduction study by B6 gabapentin  She complains of dull pain 6/10 left wrist with a tight feeling and she notices numbness and tingling in the thumb index and long finger as well as weakness especially when performing fine motor grasp  History diabetes hypertension GERD asthma sleep apnea bulging disc at L4 sciatic pain neck pain arthritis hypothyroidism previous cholecystectomy hysterectomy some knee surgery and sinus surgery  Takes levothyroxine metoprolol pantoprazole progesterone testosterone estradiol diazepam 10 mg oxycodone alprazolam and nasal spray     Social history divorced does not smoke or drink  Review of systems weight gain and fatigue blurred vision palpitations heart murmurs or shortness of breath wheezing heartburn nausea flank pain joint pain stiffness muscle pain numbness tingling anxiety seasonal allergies otherwise normal  Family history heart disease cancer lung disease diabetes asthma  Vital signs: BP 130/87  Ht 5\' 2"  (1.575 m)  Wt 310 lb (140.615 kg)  BMI 56.69 kg/m2   General the patient is well-developed and well-nourished grooming and hygiene are normal Oriented x3 Mood and affect normal Ambulation normal  Inspection of the left hand positive carpal tunnel compression test delayed positive Phalen's test Full range of motion All joints are stable Motor exam is normal Skin clean dry and  intact  Cardiovascular exam is normal Sensory exam abnormal sensation in the index and long finger  Encounter Diagnosis  Name Primary?  . Carpal tunnel syndrome Yes    Meds ordered this encounter  Medications  . gabapentin (NEURONTIN) 100 MG capsule    Sig: Take 1 capsule (100 mg total) by mouth 3 (three) times daily.    Dispense:  90 capsule    Refill:  2    Nerve conduction study followup in a month to see if this has helped if not recommend carpal tunnel release

## 2014-01-24 ENCOUNTER — Encounter (HOSPITAL_COMMUNITY): Payer: Self-pay | Admitting: Emergency Medicine

## 2014-01-24 ENCOUNTER — Emergency Department (HOSPITAL_COMMUNITY)
Admission: EM | Admit: 2014-01-24 | Discharge: 2014-01-24 | Disposition: A | Discharge status: Home / Self Care | Payer: Medicaid Other | Attending: Emergency Medicine | Admitting: Emergency Medicine

## 2014-01-24 ENCOUNTER — Emergency Department (HOSPITAL_COMMUNITY): Payer: Medicaid Other

## 2014-01-24 DIAGNOSIS — S93409A Sprain of unspecified ligament of unspecified ankle, initial encounter: Secondary | ICD-10-CM | POA: Insufficient documentation

## 2014-01-24 DIAGNOSIS — E079 Disorder of thyroid, unspecified: Secondary | ICD-10-CM | POA: Insufficient documentation

## 2014-01-24 DIAGNOSIS — W108XXA Fall (on) (from) other stairs and steps, initial encounter: Secondary | ICD-10-CM | POA: Insufficient documentation

## 2014-01-24 DIAGNOSIS — I10 Essential (primary) hypertension: Secondary | ICD-10-CM | POA: Insufficient documentation

## 2014-01-24 DIAGNOSIS — IMO0002 Reserved for concepts with insufficient information to code with codable children: Secondary | ICD-10-CM | POA: Insufficient documentation

## 2014-01-24 DIAGNOSIS — E669 Obesity, unspecified: Secondary | ICD-10-CM | POA: Insufficient documentation

## 2014-01-24 DIAGNOSIS — E119 Type 2 diabetes mellitus without complications: Secondary | ICD-10-CM | POA: Insufficient documentation

## 2014-01-24 DIAGNOSIS — J4489 Other specified chronic obstructive pulmonary disease: Secondary | ICD-10-CM | POA: Insufficient documentation

## 2014-01-24 DIAGNOSIS — G4733 Obstructive sleep apnea (adult) (pediatric): Secondary | ICD-10-CM | POA: Insufficient documentation

## 2014-01-24 DIAGNOSIS — G2581 Restless legs syndrome: Secondary | ICD-10-CM | POA: Insufficient documentation

## 2014-01-24 DIAGNOSIS — Z9981 Dependence on supplemental oxygen: Secondary | ICD-10-CM | POA: Insufficient documentation

## 2014-01-24 DIAGNOSIS — J449 Chronic obstructive pulmonary disease, unspecified: Secondary | ICD-10-CM | POA: Insufficient documentation

## 2014-01-24 DIAGNOSIS — Z8739 Personal history of other diseases of the musculoskeletal system and connective tissue: Secondary | ICD-10-CM | POA: Insufficient documentation

## 2014-01-24 DIAGNOSIS — Z9889 Other specified postprocedural states: Secondary | ICD-10-CM | POA: Insufficient documentation

## 2014-01-24 DIAGNOSIS — S93402A Sprain of unspecified ligament of left ankle, initial encounter: Secondary | ICD-10-CM

## 2014-01-24 DIAGNOSIS — Z79899 Other long term (current) drug therapy: Secondary | ICD-10-CM | POA: Insufficient documentation

## 2014-01-24 DIAGNOSIS — Y9301 Activity, walking, marching and hiking: Secondary | ICD-10-CM | POA: Insufficient documentation

## 2014-01-24 DIAGNOSIS — Y929 Unspecified place or not applicable: Secondary | ICD-10-CM | POA: Insufficient documentation

## 2014-01-24 NOTE — Discharge Instructions (Signed)
X-rays show no fracture. Ankle brace, ice, elevate, Tylenol or ibuprofen

## 2014-01-24 NOTE — ED Provider Notes (Signed)
CSN: [id number]     Arrival date & time 01/24/14  1901 History  This chart was scribed for Nat Christen, MD by Roxan Diesel, ED scribe.  This patient was seen in room APA01/APA01 and the patient's care was started at 8:31 PM.   Chief Complaint  Patient presents with  . Ankle Pain    No language interpreter was used.    HPI Comments: Kristina [name] is a 43 y.o. female who presents to the Emergency Department complaining of a left ankle injury sustained 2 days ago.  Pt reports she has sciatica in both legs and 2 days ago she was walking down cement steps when "my ankle rolled" and she fell.  Since then she has had constant, moderate-to-severe pain to the lateral left ankle.  Pain is worsened by weight-bearing and extension.  She also reports some "tingling" in her toes.     Past Medical History  Diagnosis Date  . Hypertension   . Diabetes mellitus   . Asthma   . COPD (chronic obstructive pulmonary disease)   . Restless leg   . High cholesterol   . Bulging disc   . Thyroid disease   . Obstructive sleep apnea     uses c-pap at night  . Obesity     Past Surgical History  Procedure Laterality Date  . Abdominal hysterectomy    . Cholecystectomy    . Knee surgery  2011    right  . Nose surgery    . Esophagogastroduodenoscopy N/A 10/27/2013    Procedure: ESOPHAGOGASTRODUODENOSCOPY (EGD);  Surgeon: [name], MD;  Location: [facility];  Service: Endoscopy;  Laterality: N/A;    Family History  Problem Relation Age of Onset  . Emphysema Mother   . Allergies Mother   . Allergies Child   . Heart disease Father     deceased at age 70 from heart attack  . Heart disease Mother   . Asthma Son   . Asthma Brother   . Asthma Brother   . Asthma Mother   . Ovarian cancer Mother   . Lung cancer Maternal Aunt     History  Substance Use Topics  . Smoking status: Never Smoker   . Smokeless tobacco: Never Used  . Alcohol Use: No    OB History   Grav Para Term Preterm  Abortions TAB SAB Ect Mult Living                   Review of Systems A complete 10 system review of systems was obtained and all systems are negative except as noted in the HPI and PMH.     Allergies  Ampicillin; Codeine; Guaifenesin & derivatives; and Sulfonamide derivatives  Home Medications   Prior to Admission medications   Medication Sig Start Date End Date Taking? Authorizing Provider  ALPRAZolam Duanne Moron) 1 MG tablet Take 1 mg by mouth daily.    Historical Provider, MD  diazepam (VALIUM) 5 MG tablet Take 5 mg by mouth 3 (three) times daily as needed. 1 po tid for spasm 03/13/12   Lenox Ahr, PA-C  estradiol (ESTRADERM) 0.05 MG/24HR patch Place 1 patch onto the skin 2 (two) times a week.    Historical Provider, MD  FLUoxetine (PROZAC) 20 MG capsule Take 20 mg by mouth daily.    Historical Provider, MD  gabapentin (NEURONTIN) 100 MG capsule Take 1 capsule (100 mg total) by mouth 3 (three) times daily. 01/20/14   Carole Civil, MD  levothyroxine (  SYNTHROID, LEVOTHROID) 50 MCG tablet Take 50 mcg by mouth daily.    Historical Provider, MD  metoprolol (LOPRESSOR) 50 MG tablet Take 1 tablet (50 mg total) by mouth 2 (two) times daily. 09/24/13   Tanda Rockers, MD  mometasone (NASONEX) 50 MCG/ACT nasal spray Place 2 sprays into the nose daily.    Historical Provider, MD  ondansetron (ZOFRAN) 4 MG tablet Take 1 tablet by mouth Every 4 hours as needed. nausea    Historical Provider, MD  OxyCODONE (OXYCONTIN) 10 mg T12A 12 hr tablet Take 10 mg by mouth every 12 (twelve) hours as needed.    Historical Provider, MD  pantoprazole (PROTONIX) 40 MG tablet TAKE ONE TABLET BY MOUTH TWICE DAILY TAKE ONE TABLET 30-60 MINUTES BEFORE FIRST MEAL OF THE DAY 10/27/13   Amy S Esterwood, PA-C  progesterone (PROMETRIUM) 100 MG capsule Take 100 mg by mouth daily.    Historical Provider, MD   BP 142/57  Pulse 98  Temp(Src) 98.2 F (36.8 C) (Oral)  Resp 20  Ht 5\' 2"  (1.575 m)  Wt 310 lb (140.615 kg)   BMI 56.69 kg/m2  SpO2 96%  Physical Exam  Nursing note and vitals reviewed. Constitutional: She is oriented to person, place, and time. She appears well-developed and well-nourished.  HENT:  Head: Normocephalic and atraumatic.  Eyes: Conjunctivae and EOM are normal. Pupils are equal, round, and reactive to light.  Neck: Normal range of motion. Neck supple.  Cardiovascular: Normal rate, regular rhythm and normal heart sounds.   Pulmonary/Chest: Effort normal and breath sounds normal.  Abdominal: Soft. Bowel sounds are normal.  Musculoskeletal: She exhibits tenderness.  Tenderness to lateral aspect of left ankle.  Pain with extension at the ankle.  Neurological: She is alert and oriented to person, place, and time.  Skin: Skin is warm and dry.  Psychiatric: She has a normal mood and affect. Her behavior is normal.    ED Course  Procedures (including critical care time)  DIAGNOSTIC STUDIES: Oxygen Saturation is 96% on room air, normal by my interpretation.    COORDINATION OF CARE: 8:33 PM-Informed pt that x-ray is negative for fracture.  Discussed treatment plan which includes ASO ankle brace application and RICE treatment with pt at bedside and pt agreed to plan.     Dg Ankle Complete Left  01/24/2014   CLINICAL DATA:  Left ankle pain  EXAM: LEFT ANKLE COMPLETE - 3+ VIEW  COMPARISON:  None.  FINDINGS: There is no evidence of fracture, dislocation, or joint effusion. There is no evidence of arthropathy or other focal bone abnormality. Soft tissues are unremarkable.  IMPRESSION: No acute abnormality noted.   Electronically Signed   By: Inez Catalina M.D.   On: 01/24/2014 20:25     MDM   Final diagnoses:  Left ankle sprain    Plain films of left ankle show no acute fracture.   Ice, elevate, ankle splint.  No head or neck trauma     I personally performed the services described in this documentation, which was scribed in my presence. The recorded information has been reviewed  and is accurate.    Nat Christen, MD 01/25/14 [phone]

## 2014-01-24 NOTE — ED Notes (Signed)
Pain lt ankle , fell when going down steps.  Injury 5/9

## 2014-02-04 ENCOUNTER — Telehealth: Payer: Self-pay | Admitting: Internal Medicine

## 2014-02-04 NOTE — Telephone Encounter (Signed)
Patient has [place] Medicaid and bariatric surgery is not covered at [place].  She wants a referral to another center, but don't know where she wants to go.  She is advised that she will need to see her primary care for a referral as she is [place] and it needs to come from them.  I am not aware of another surgical group that covers [place] medicaid for bariatric surgery.  She is asked to follow up with her primary care

## 2014-02-04 NOTE — Telephone Encounter (Signed)
Left message for patient to call back  

## 2014-02-15 ENCOUNTER — Ambulatory Visit: Payer: Medicaid Other | Admitting: Orthopedic Surgery

## 2014-02-15 ENCOUNTER — Telehealth: Payer: Self-pay | Admitting: Orthopedic Surgery

## 2014-02-15 NOTE — Telephone Encounter (Signed)
Faxed office notes to Dr Knowlton/Gosrani's office, fax [phone], for date of service 01/20/14 primary care physician.  Followed up regarding status of referral for patient to have nerve conduction study (prior to next appointment in our office.  )

## 2014-02-16 ENCOUNTER — Other Ambulatory Visit: Payer: Self-pay | Admitting: Internal Medicine

## 2014-05-04 ENCOUNTER — Emergency Department (HOSPITAL_COMMUNITY)
Admission: EM | Admit: 2014-05-04 | Discharge: 2014-05-04 | Disposition: A | Discharge status: Home / Self Care | Payer: Medicaid Other | Attending: Emergency Medicine | Admitting: Emergency Medicine

## 2014-05-04 ENCOUNTER — Encounter (HOSPITAL_COMMUNITY): Payer: Self-pay | Admitting: Emergency Medicine

## 2014-05-04 ENCOUNTER — Emergency Department (HOSPITAL_COMMUNITY): Payer: Medicaid Other

## 2014-05-04 DIAGNOSIS — IMO0002 Reserved for concepts with insufficient information to code with codable children: Secondary | ICD-10-CM | POA: Diagnosis not present

## 2014-05-04 DIAGNOSIS — Z8669 Personal history of other diseases of the nervous system and sense organs: Secondary | ICD-10-CM | POA: Diagnosis not present

## 2014-05-04 DIAGNOSIS — E669 Obesity, unspecified: Secondary | ICD-10-CM | POA: Insufficient documentation

## 2014-05-04 DIAGNOSIS — R079 Chest pain, unspecified: Secondary | ICD-10-CM | POA: Diagnosis present

## 2014-05-04 DIAGNOSIS — I10 Essential (primary) hypertension: Secondary | ICD-10-CM | POA: Diagnosis not present

## 2014-05-04 DIAGNOSIS — Z79899 Other long term (current) drug therapy: Secondary | ICD-10-CM | POA: Diagnosis not present

## 2014-05-04 DIAGNOSIS — R51 Headache: Secondary | ICD-10-CM | POA: Insufficient documentation

## 2014-05-04 DIAGNOSIS — J449 Chronic obstructive pulmonary disease, unspecified: Secondary | ICD-10-CM | POA: Diagnosis not present

## 2014-05-04 DIAGNOSIS — Z880 Allergy status to penicillin: Secondary | ICD-10-CM | POA: Insufficient documentation

## 2014-05-04 DIAGNOSIS — J4489 Other specified chronic obstructive pulmonary disease: Secondary | ICD-10-CM | POA: Insufficient documentation

## 2014-05-04 DIAGNOSIS — R519 Headache, unspecified: Secondary | ICD-10-CM

## 2014-05-04 DIAGNOSIS — E119 Type 2 diabetes mellitus without complications: Secondary | ICD-10-CM | POA: Insufficient documentation

## 2014-05-04 LAB — BASIC METABOLIC PANEL WITH GFR
Anion gap: 14 (ref 5–15)
BUN: 10 mg/dL (ref 6–23)
CO2: 25 meq/L (ref 19–32)
Calcium: 9.5 mg/dL (ref 8.4–10.5)
Chloride: 104 meq/L (ref 96–112)
Creatinine, Ser: 0.71 mg/dL (ref 0.50–1.10)
GFR calc Af Amer: >90 mL/min
GFR calc non Af Amer: >90 mL/min
Glucose, Bld: 98 mg/dL (ref 70–99)
Potassium: 3.8 meq/L (ref 3.7–5.3)
Sodium: 143 meq/L (ref 137–147)

## 2014-05-04 LAB — CBC
HCT: 39.5 % (ref 36.0–46.0)
Hemoglobin: 12.8 g/dL (ref 12.0–15.0)
MCH: 26.8 pg (ref 26.0–34.0)
MCHC: 32.4 g/dL (ref 30.0–36.0)
MCV: 82.6 fL (ref 78.0–100.0)
Platelets: 176 K/uL (ref 150–400)
RBC: 4.78 MIL/uL (ref 3.87–5.11)
RDW: 15.7 % — ABNORMAL HIGH (ref 11.5–15.5)
WBC: 6.9 K/uL (ref 4.0–10.5)

## 2014-05-04 LAB — TROPONIN I
Troponin I: <0.3 ng/mL
Troponin I: <0.3 ng/mL

## 2014-05-04 MED ORDER — PROMETHAZINE HCL 25 MG/ML IJ SOLN
12.5000 mg | Freq: Once | INTRAMUSCULAR | Status: AC
  Administered 2014-05-04: 12.5 mg via INTRAVENOUS
  Filled 2014-05-04: qty 1

## 2014-05-04 MED ORDER — SODIUM CHLORIDE 0.9 % IV SOLN
INTRAVENOUS | Status: DC
  Administered 2014-05-04: 17:00:00 via INTRAVENOUS

## 2014-05-04 MED ORDER — DIPHENHYDRAMINE HCL 50 MG/ML IJ SOLN
25.0000 mg | Freq: Once | INTRAMUSCULAR | Status: AC
  Administered 2014-05-04: 25 mg via INTRAVENOUS
  Filled 2014-05-04: qty 1

## 2014-05-04 MED ORDER — SODIUM CHLORIDE 0.9 % IV BOLUS (SEPSIS)
500.0000 mL | Freq: Once | INTRAVENOUS | Status: AC
  Administered 2014-05-04: 500 mL via INTRAVENOUS

## 2014-05-04 MED ORDER — HYDROMORPHONE HCL PF 1 MG/ML IJ SOLN
1.0000 mg | Freq: Once | INTRAMUSCULAR | Status: AC
  Administered 2014-05-04: 1 mg via INTRAVENOUS
  Filled 2014-05-04: qty 1

## 2014-05-04 MED ORDER — SODIUM CHLORIDE 0.9 % IV BOLUS (SEPSIS)
500.0000 mL | Freq: Once | INTRAVENOUS | Status: AC
  Administered 2014-05-04: 17:00:00 via INTRAVENOUS

## 2014-05-04 MED ORDER — ONDANSETRON HCL 4 MG/2ML IJ SOLN
4.0000 mg | Freq: Once | INTRAMUSCULAR | Status: AC
  Administered 2014-05-04: 4 mg via INTRAVENOUS
  Filled 2014-05-04: qty 2

## 2014-05-04 MED ORDER — DEXAMETHASONE SODIUM PHOSPHATE 4 MG/ML IJ SOLN
10.0000 mg | Freq: Once | INTRAMUSCULAR | Status: AC
  Administered 2014-05-04: 10 mg via INTRAVENOUS
  Filled 2014-05-04: qty 3

## 2014-05-04 NOTE — ED Notes (Signed)
Ambulated patient to restroom with standby assistance. Upon returning patient to room she became a little dizzy and unsteady. Assisted back to bed.

## 2014-05-04 NOTE — ED Notes (Signed)
Dr. Zackowski at bedside  

## 2014-05-04 NOTE — ED Notes (Signed)
Patient and patients family verbalizes understanding of discharge instructions, home care, and follow up care. Patient ambulatory out of department at this time escorted by family.

## 2014-05-04 NOTE — ED Notes (Signed)
Patient lying in bed in a position of comfort. Family remains at bedside, no needs voiced at this time.

## 2014-05-04 NOTE — ED Notes (Signed)
Patient ambulatory to restroom at this time, steady gait, no complaints at this time.

## 2014-05-04 NOTE — ED Provider Notes (Signed)
CSN: [id number]     Arrival date & time 05/04/14  1522 History  This chart was scribed for Fredia Sorrow, MD by Delphia Grates, ED Scribe. This patient was seen in room APA02/APA02 and the patient's care was started at 4:40 PM.    Chief Complaint  Patient presents with  . Chest Pain      Patient is a 43 y.o. female presenting with chest pain and headaches. The history is provided by the patient. No language interpreter was used.  Chest Pain Pain location:  Substernal area Pain radiates to:  Does not radiate Pain radiates to the back: no   Pain severity:  Moderate Onset quality:  Sudden Duration:  2 hours Timing:  Constant Progression:  Unchanged Chronicity:  New Context: at rest   Associated symptoms: back pain, headache, nausea and vomiting   Associated symptoms: no abdominal pain, no fever and no shortness of breath   Risk factors: diabetes mellitus, high cholesterol and hypertension   Headache Quality: throbbing. Radiates to:  Does not radiate Onset quality:  Sudden Duration:  8 hours Timing:  Constant Progression:  Unchanged Chronicity:  New Context: not activity and not stress   Relieved by:  Nothing Worsened by:  Nothing tried Ineffective treatments:  NSAIDs Associated symptoms: back pain, nausea and vomiting   Associated symptoms: no abdominal pain, no congestion, no diarrhea, no fever, no neck pain and no sore throat     HPI Comments: Kristina [name] is a 43 y.o. female who presents to the Emergency Department complaining of gradually worsening, throbbing HA onset this morning at 0900. She reports she felt fine yesterday, however woke up with a HA this morning. Patient states she thought it was related to sinuses and continued with her day. She states after having lunch, she felt "sick to her stomach and queasy". She later went home to rest, however, she reports her symptoms did not resolve, and she later developed CP around 1500. There is associated nausea,  emesis, blurred vision, and back pain. Patient has taken 800 mg Ibuprofen without significant improvement. She reports her blood glucose was 119 last check (PTA), and her blood pressure at home was 90/60, which she reports is below baseline. Patient denies history of migraines. She also denies fever, chills, SOB, congestion, leg swelling, neck pain, diarrhea, or abdominal pain. Patient has history of DM, HTN, asthma, COPD, high cholesterol, thyroid disease, and bulging disc.   Past Medical History  Diagnosis Date  . Hypertension   . Diabetes mellitus   . Asthma   . COPD (chronic obstructive pulmonary disease)   . Restless leg   . High cholesterol   . Bulging disc   . Thyroid disease   . Obstructive sleep apnea     uses c-pap at night  . Obesity    Past Surgical History  Procedure Laterality Date  . Abdominal hysterectomy    . Cholecystectomy    . Knee surgery  2011    right  . Nose surgery    . Esophagogastroduodenoscopy N/A 10/27/2013    Procedure: ESOPHAGOGASTRODUODENOSCOPY (EGD);  Surgeon: [name], MD;  Location: [facility];  Service: Endoscopy;  Laterality: N/A;   Family History  Problem Relation Age of Onset  . Emphysema Mother   . Allergies Mother   . Allergies Child   . Heart disease Father     deceased at age 34 from heart attack  . Heart disease Mother   . Asthma Son   . Asthma Brother   .  Asthma Brother   . Asthma Mother   . Ovarian cancer Mother   . Lung cancer Maternal Aunt    History  Substance Use Topics  . Smoking status: Never Smoker   . Smokeless tobacco: Never Used  . Alcohol Use: No   OB History   Grav Para Term Preterm Abortions TAB SAB Ect Mult Living                 Review of Systems  Constitutional: Negative for fever and chills.  HENT: Negative for congestion, rhinorrhea and sore throat.   Eyes: Positive for visual disturbance.  Respiratory: Negative for shortness of breath.   Cardiovascular: Positive for chest pain. Negative  for leg swelling.  Gastrointestinal: Positive for nausea and vomiting. Negative for abdominal pain and diarrhea.  Genitourinary: Negative for dysuria and hematuria.  Musculoskeletal: Positive for back pain. Negative for neck pain.  Skin: Negative for rash.  Neurological: Positive for headaches.  Hematological: Does not bruise/bleed easily.  Psychiatric/Behavioral: Negative for confusion.      Allergies  Ampicillin; Codeine; Guaifenesin & derivatives; and Sulfonamide derivatives  Home Medications   Prior to Admission medications   Medication Sig Start Date End Date Taking? Authorizing Provider  ALPRAZolam Duanne Moron) 1 MG tablet Take 1 mg by mouth 2 (two) times daily as needed for anxiety or sleep.    Yes Historical Provider, MD  estradiol (ESTRADERM) 0.05 MG/24HR patch Place 1 patch onto the skin 2 (two) times a week.   Yes Historical Provider, MD  FLUoxetine (PROZAC) 20 MG capsule Take 20 mg by mouth every other day. At bedtime   Yes Historical Provider, MD  fluticasone (FLONASE) 50 MCG/ACT nasal spray Place 2 sprays into both nostrils 2 (two) times daily.   Yes Historical Provider, MD  ibuprofen (ADVIL,MOTRIN) 800 MG tablet Take 800 mg by mouth every 8 (eight) hours as needed for mild pain or moderate pain.   Yes Historical Provider, MD  levothyroxine (SYNTHROID, LEVOTHROID) 75 MCG tablet Take 75 mcg by mouth every morning.   Yes Historical Provider, MD  metoprolol (LOPRESSOR) 50 MG tablet Take 50 mg by mouth at bedtime.   Yes Historical Provider, MD  OxyCODONE (OXYCONTIN) 10 mg T12A 12 hr tablet Take 10 mg by mouth every 12 (twelve) hours as needed (for pain).    Yes Historical Provider, MD  pantoprazole (PROTONIX) 40 MG tablet Take 40 mg by mouth 2 (two) times daily.   Yes Historical Provider, MD  progesterone (PROMETRIUM) 100 MG capsule Take 100 mg by mouth at bedtime.    Yes Historical Provider, MD  testosterone cypionate (DEPOTESTOTERONE CYPIONATE) 100 MG/ML injection Inject 0.05 mg  into the muscle every 7 (seven) days. For IM use only   Yes Historical Provider, MD   Triage Vitals: BP 115/66  Pulse 69  Resp 16  Ht 5\' 2"  (1.575 m)  Wt 285 lb (129.275 kg)  BMI 52.11 kg/m2  SpO2 93%  Physical Exam  Nursing note and vitals reviewed. Constitutional: She is oriented to person, place, and time. She appears well-developed and well-nourished. No distress.  HENT:  Head: Normocephalic and atraumatic.  Mouth/Throat: Mucous membranes are normal.  Eyes: Conjunctivae and EOM are normal.  Neck: Neck supple. No tracheal deviation present.  Cardiovascular: Normal rate, regular rhythm and normal heart sounds.   No murmur heard. Pulmonary/Chest: Effort normal and breath sounds normal. No respiratory distress. She has no wheezes.  Abdominal: Soft. Bowel sounds are normal. There is no tenderness.  Musculoskeletal: Normal range  of motion. She exhibits no edema.  Neurological: She is alert and oriented to person, place, and time. No cranial nerve deficit. She exhibits normal muscle tone. Coordination normal.  Skin: Skin is warm and dry.  Psychiatric: She has a normal mood and affect. Her behavior is normal.    ED Course  Procedures (including critical care time)  DIAGNOSTIC STUDIES: Oxygen Saturation is 93% on room air, adequate by my interpretation.    COORDINATION OF CARE: At 5726 Discussed treatment plan with patient which includes CT scan (head). Patient agrees.  9:06 PM- Upon recheck, patient still complains of HA. I informed patient the medication takes a little loner to take affect. Pt advised of plan for treatment and pt agrees.    Medications  0.9 %  sodium chloride infusion ( Intravenous Stopped 05/04/14 1900)  sodium chloride 0.9 % bolus 500 mL (0 mLs Intravenous Stopped 05/04/14 1745)  HYDROmorphone (DILAUDID) injection 1 mg (1 mg Intravenous Given 05/04/14 1706)  ondansetron (ZOFRAN) injection 4 mg (4 mg Intravenous Given 05/04/14 1706)  HYDROmorphone (DILAUDID)  injection 1 mg (1 mg Intravenous Given 05/04/14 1924)  dexamethasone (DECADRON) injection 10 mg (10 mg Intravenous Given 05/04/14 1853)  diphenhydrAMINE (BENADRYL) injection 25 mg (25 mg Intravenous Given 05/04/14 1855)  promethazine (PHENERGAN) injection 12.5 mg (12.5 mg Intravenous Given 05/04/14 1856)  sodium chloride 0.9 % bolus 500 mL (0 mLs Intravenous Stopped 05/04/14 2059)      Results for orders placed during the hospital encounter of 20/35/59  BASIC METABOLIC PANEL      Result Value Ref Range   Sodium 143  137 - 147 mEq/L   Potassium 3.8  3.7 - 5.3 mEq/L   Chloride 104  96 - 112 mEq/L   CO2 25  19 - 32 mEq/L   Glucose, Bld 98  70 - 99 mg/dL   BUN 10  6 - 23 mg/dL   Creatinine, Ser 0.71  0.50 - 1.10 mg/dL   Calcium 9.5  8.4 - 10.5 mg/dL   GFR calc non Af Amer >90  >90 mL/min   GFR calc Af Amer >90  >90 mL/min   Anion gap 14  5 - 15  CBC      Result Value Ref Range   WBC 6.9  4.0 - 10.5 K/uL   RBC 4.78  3.87 - 5.11 MIL/uL   Hemoglobin 12.8  12.0 - 15.0 g/dL   HCT 39.5  36.0 - 46.0 %   MCV 82.6  78.0 - 100.0 fL   MCH 26.8  26.0 - 34.0 pg   MCHC 32.4  30.0 - 36.0 g/dL   RDW 15.7 (*) 11.5 - 15.5 %   Platelets 176  150 - 400 K/uL  TROPONIN I      Result Value Ref Range   Troponin I <0.30  <0.30 ng/mL  TROPONIN I      Result Value Ref Range   Troponin I <0.30  <0.30 ng/mL   Ct Head Wo Contrast  05/04/2014   CLINICAL DATA:  Chest pain, headache  EXAM: CT HEAD WITHOUT CONTRAST  TECHNIQUE: Contiguous axial images were obtained from the base of the skull through the vertex without intravenous contrast.  COMPARISON:  05/22/2008  FINDINGS: No skull fracture is noted. Paranasal sinuses and mastoid air cells are unremarkable. The left frontal sinus is hypoplastic.  No intracranial hemorrhage, mass effect or midline shift. No acute cortical infarction. No hydrocephalus. The gray and white-matter differentiation is preserved. No mass lesion is noted on  this unenhanced scan.  IMPRESSION:  No acute intracranial abnormality.   Electronically Signed   By: Lahoma Crocker M.D.   On: 05/04/2014 17:47   Dg Chest Port 1 View  05/04/2014   CLINICAL DATA:  Chest pain.  Diabetes.  Hypertension.  EXAM: PORTABLE CHEST - 1 VIEW  COMPARISON:  11/23/2012 .  FINDINGS: Mediastinum hilar structures are normal. Cardiomegaly. Previously identified bilateral pulmonary infiltrates lateral chest x-ray 11/23/2012 have resolved no focal new infiltrates present. No pleural effusion pneumothorax  IMPRESSION: 1. Resolution of previously identified bilateral pulmonary infiltrates noted on chest x-ray 11/23/2012. 2. Cardiomegaly, no evidence of congestive heart failure.   Electronically Signed   By: Marcello Moores  Register   On: 05/04/2014 16:07      EKG Interpretation   Date/Time:  Wednesday May 04 2014 15:33:25 EDT Ventricular Rate:  71 PR Interval:  219 QRS Duration: 117 QT Interval:  411 QTC Calculation: 447 R Axis:   79 Text Interpretation:  Sinus rhythm Prolonged PR interval Consider left  atrial enlargement Nonspecific intraventricular conduction delay Low  voltage, precordial leads No significant change since last tracing  Confirmed by Janesha Brissette  MD, Oiva Dibari ([id number]) on 05/04/2014 4:14:04 PM      MDM   Final diagnoses:  Headache, unspecified headache type  Chest pain, unspecified chest pain type    Patient's main complaint was headache onset of headache was at 7:30 this morning. Associated with nausea and vomiting and some chest discomfort chest discomfort was not severe did come on later after the headache onset. Patient does not have a history of migraines. Workup for the headache head CT without evidence of any abnormalities. Workup for the chest pain troponins negative x2 chest x-ray without any acute cardiac findings and cardiomegaly but no evidence of CHF. Patient's headache was not under clap in nature. Patient's headache not relieved with first dose of hydromorphone. Patient then given migraine  cocktail headache is improving. Followup with her doctor will be important.  Patient EKG without any acute changes.  I personally performed the services described in this documentation, which was scribed in my presence. The recorded information has been reviewed and is accurate.    Fredia Sorrow, MD 05/04/14 2141

## 2014-05-04 NOTE — Discharge Instructions (Signed)
Go home and rest. Long-acting medications provided for the headache should kick in. Return for any newer worse symptoms. Followup with your doctor if you have persistent headaches. Workup for the chest pain was negative. Head CT of the head was negative.

## 2014-05-04 NOTE — ED Notes (Signed)
Pt woke with HA, had nausea, abdominal cramping, blurred vision and central CP with SOB. CP started at 1500, pt describes and sharp and stabbing. Took 800mg  motrin for HA at 0900

## 2014-05-19 ENCOUNTER — Other Ambulatory Visit: Payer: Self-pay | Admitting: Internal Medicine

## 2014-06-18 ENCOUNTER — Other Ambulatory Visit: Payer: Self-pay | Admitting: Physician Assistant

## 2014-07-05 ENCOUNTER — Ambulatory Visit: Payer: Medicaid Other | Admitting: Pulmonary Disease

## 2014-07-08 ENCOUNTER — Ambulatory Visit: Payer: Medicaid Other | Admitting: Pulmonary Disease

## 2014-08-03 ENCOUNTER — Other Ambulatory Visit: Payer: Self-pay | Admitting: Physician Assistant

## 2014-08-19 ENCOUNTER — Encounter (HOSPITAL_COMMUNITY): Payer: Self-pay | Admitting: Emergency Medicine

## 2014-08-19 ENCOUNTER — Emergency Department (HOSPITAL_COMMUNITY): Payer: Medicaid Other

## 2014-08-19 ENCOUNTER — Emergency Department (HOSPITAL_COMMUNITY)
Admission: EM | Admit: 2014-08-19 | Discharge: 2014-08-19 | Disposition: A | Discharge status: Home / Self Care | Payer: Medicaid Other | Attending: Emergency Medicine | Admitting: Emergency Medicine

## 2014-08-19 DIAGNOSIS — Z90710 Acquired absence of both cervix and uterus: Secondary | ICD-10-CM | POA: Diagnosis not present

## 2014-08-19 DIAGNOSIS — R1031 Right lower quadrant pain: Secondary | ICD-10-CM | POA: Insufficient documentation

## 2014-08-19 DIAGNOSIS — Z79899 Other long term (current) drug therapy: Secondary | ICD-10-CM | POA: Insufficient documentation

## 2014-08-19 DIAGNOSIS — Z9889 Other specified postprocedural states: Secondary | ICD-10-CM | POA: Diagnosis not present

## 2014-08-19 DIAGNOSIS — J449 Chronic obstructive pulmonary disease, unspecified: Secondary | ICD-10-CM | POA: Insufficient documentation

## 2014-08-19 DIAGNOSIS — E039 Hypothyroidism, unspecified: Secondary | ICD-10-CM | POA: Insufficient documentation

## 2014-08-19 DIAGNOSIS — Z9981 Dependence on supplemental oxygen: Secondary | ICD-10-CM | POA: Insufficient documentation

## 2014-08-19 DIAGNOSIS — G4733 Obstructive sleep apnea (adult) (pediatric): Secondary | ICD-10-CM | POA: Diagnosis not present

## 2014-08-19 DIAGNOSIS — E669 Obesity, unspecified: Secondary | ICD-10-CM | POA: Diagnosis not present

## 2014-08-19 DIAGNOSIS — R109 Unspecified abdominal pain: Secondary | ICD-10-CM | POA: Diagnosis present

## 2014-08-19 DIAGNOSIS — Z8739 Personal history of other diseases of the musculoskeletal system and connective tissue: Secondary | ICD-10-CM | POA: Diagnosis not present

## 2014-08-19 DIAGNOSIS — K5900 Constipation, unspecified: Secondary | ICD-10-CM | POA: Diagnosis not present

## 2014-08-19 DIAGNOSIS — I10 Essential (primary) hypertension: Secondary | ICD-10-CM | POA: Diagnosis not present

## 2014-08-19 DIAGNOSIS — Z9089 Acquired absence of other organs: Secondary | ICD-10-CM | POA: Diagnosis not present

## 2014-08-19 DIAGNOSIS — Z880 Allergy status to penicillin: Secondary | ICD-10-CM | POA: Diagnosis not present

## 2014-08-19 DIAGNOSIS — E119 Type 2 diabetes mellitus without complications: Secondary | ICD-10-CM | POA: Insufficient documentation

## 2014-08-19 LAB — COMPREHENSIVE METABOLIC PANEL
ALK PHOS: 115 U/L (ref 39–117)
ALT: 16 U/L (ref 0–35)
AST: 17 U/L (ref 0–37)
Albumin: 3.8 g/dL (ref 3.5–5.2)
Anion gap: 13 (ref 5–15)
BUN: 12 mg/dL (ref 6–23)
CHLORIDE: 102 meq/L (ref 96–112)
CO2: 25 mEq/L (ref 19–32)
Calcium: 9.4 mg/dL (ref 8.4–10.5)
Creatinine, Ser: 0.74 mg/dL (ref 0.50–1.10)
GFR calc non Af Amer: >90 mL/min (ref >90)
GLUCOSE: 100 mg/dL — AB (ref 70–99)
POTASSIUM: 4.2 meq/L (ref 3.7–5.3)
SODIUM: 140 meq/L (ref 137–147)
TOTAL PROTEIN: 7.3 g/dL (ref 6.0–8.3)
Total Bilirubin: 0.3 mg/dL (ref 0.3–1.2)

## 2014-08-19 LAB — URINALYSIS, ROUTINE W REFLEX MICROSCOPIC
Bilirubin Urine: NEGATIVE
GLUCOSE, UA: NEGATIVE mg/dL
Hgb urine dipstick: NEGATIVE
Ketones, ur: NEGATIVE mg/dL
Leukocytes, UA: NEGATIVE
Nitrite: NEGATIVE
Protein, ur: NEGATIVE mg/dL
Specific Gravity, Urine: <1.005 — ABNORMAL LOW (ref 1.005–1.030)
Urobilinogen, UA: 0.2 mg/dL (ref 0.0–1.0)
pH: 5 (ref 5.0–8.0)

## 2014-08-19 LAB — CBC WITH DIFFERENTIAL/PLATELET
Basophils Absolute: 0 10*3/uL (ref 0.0–0.1)
Basophils Relative: 0 % (ref 0–1)
Eosinophils Absolute: 0.2 10*3/uL (ref 0.0–0.7)
Eosinophils Relative: 2 % (ref 0–5)
HCT: 40.9 % (ref 36.0–46.0)
Hemoglobin: 13.6 g/dL (ref 12.0–15.0)
LYMPHS ABS: 2.6 10*3/uL (ref 0.7–4.0)
LYMPHS PCT: 29 % (ref 12–46)
MCH: 28.8 pg (ref 26.0–34.0)
MCHC: 33.3 g/dL (ref 30.0–36.0)
MCV: 86.5 fL (ref 78.0–100.0)
Monocytes Absolute: 0.7 10*3/uL (ref 0.1–1.0)
Monocytes Relative: 8 % (ref 3–12)
NEUTROS ABS: 5.4 10*3/uL (ref 1.7–7.7)
NEUTROS PCT: 61 % (ref 43–77)
PLATELETS: 217 10*3/uL (ref 150–400)
RBC: 4.73 MIL/uL (ref 3.87–5.11)
RDW: 13.4 % (ref 11.5–15.5)
WBC: 8.8 10*3/uL (ref 4.0–10.5)

## 2014-08-19 LAB — CBG MONITORING, ED: GLUCOSE-CAPILLARY: 101 mg/dL — AB (ref 70–99)

## 2014-08-19 LAB — LIPASE, BLOOD: Lipase: 32 U/L (ref 11–59)

## 2014-08-19 MED ORDER — MAGNESIUM HYDROXIDE 400 MG/5ML PO SUSP
15.0000 mL | Freq: Once | ORAL | Status: AC
  Administered 2014-08-19: 15 mL via ORAL

## 2014-08-19 MED ORDER — SODIUM CHLORIDE 0.9 % IV SOLN
1000.0000 mL | Freq: Once | INTRAVENOUS | Status: AC
  Administered 2014-08-19: 1000 mL via INTRAVENOUS

## 2014-08-19 MED ORDER — IOHEXOL 300 MG/ML  SOLN
100.0000 mL | Freq: Once | INTRAMUSCULAR | Status: AC | PRN
  Administered 2014-08-19: 100 mL via INTRAVENOUS

## 2014-08-19 MED ORDER — ONDANSETRON HCL 4 MG/2ML IJ SOLN
4.0000 mg | Freq: Once | INTRAMUSCULAR | Status: AC
  Administered 2014-08-19: 4 mg via INTRAVENOUS

## 2014-08-19 MED ORDER — IOHEXOL 300 MG/ML  SOLN
50.0000 mL | Freq: Once | INTRAMUSCULAR | Status: AC | PRN
  Administered 2014-08-19: 50 mL via ORAL

## 2014-08-19 MED ORDER — HYDROMORPHONE HCL 1 MG/ML IJ SOLN
0.5000 mg | Freq: Once | INTRAMUSCULAR | Status: AC
  Administered 2014-08-19: 0.5 mg via INTRAVENOUS

## 2014-08-19 NOTE — ED Notes (Signed)
Had vomiting and abdominal pain two days ago.  Woke up this am with abdominal pain.  Rates pain 6.  Have not taken any meds is am.

## 2014-08-19 NOTE — Discharge Instructions (Signed)
Your urine test is negative for infection or signs of kidney stone. Your chemistries are negative for major infection, liver problems, kidney problems, or electrolyte imbalances. Your CT scan does not show any major problems, in particular your appendix appears to be within normal limits. There is noted increased stool and gas throughout your colon. Please increase your fiber and water. Please use your chronic pain medicines only when needed as these contribute to constipation symptoms. Stool softener of your choice may be helpful. Please see Dr. Anastasio Champion Constipation Constipation is when a person has fewer than three bowel movements a week, has difficulty having a bowel movement, or has stools that are dry, hard, or larger than normal. As people grow older, constipation is more common. If you try to fix constipation with medicines that make you have a bowel movement (laxatives), the problem may get worse. Long-term laxative use may cause the muscles of the colon to become weak. A low-fiber diet, not taking in enough fluids, and taking certain medicines may make constipation worse.  CAUSES   Certain medicines, such as antidepressants, pain medicine, iron supplements, antacids, and water pills.   Certain diseases, such as diabetes, irritable bowel syndrome (IBS), thyroid disease, or depression.   Not drinking enough water.   Not eating enough fiber-rich foods.   Stress or travel.   Lack of physical activity or exercise.   Ignoring the urge to have a bowel movement.   Using laxatives too much.  SIGNS AND SYMPTOMS   Having fewer than three bowel movements a week.   Straining to have a bowel movement.   Having stools that are hard, dry, or larger than normal.   Feeling full or bloated.   Pain in the lower abdomen.   Not feeling relief after having a bowel movement.  DIAGNOSIS  Your health care provider will take a medical history and perform a physical exam. Further testing  may be done for severe constipation. Some tests may include:  A barium enema X-ray to examine your rectum, colon, and, sometimes, your small intestine.   A sigmoidoscopy to examine your lower colon.   A colonoscopy to examine your entire colon. TREATMENT  Treatment will depend on the severity of your constipation and what is causing it. Some dietary treatments include drinking more fluids and eating more fiber-rich foods. Lifestyle treatments may include regular exercise. If these diet and lifestyle recommendations do not help, your health care provider may recommend taking over-the-counter laxative medicines to help you have bowel movements. Prescription medicines may be prescribed if over-the-counter medicines do not work.  HOME CARE INSTRUCTIONS   Eat foods that have a lot of fiber, such as fruits, vegetables, whole grains, and beans.  Limit foods high in fat and processed sugars, such as french fries, hamburgers, cookies, candies, and soda.   A fiber supplement may be added to your diet if you cannot get enough fiber from foods.   Drink enough fluids to keep your urine clear or pale yellow.   Exercise regularly or as directed by your health care provider.   Go to the restroom when you have the urge to go. Do not hold it.   Only take over-the-counter or prescription medicines as directed by your health care provider. Do not take other medicines for constipation without talking to your health care provider first.  [place] IF:   You have bright red blood in your stool.   Your constipation lasts for more than 4 days or gets  worse.   You have abdominal or rectal pain.   You have thin, pencil-like stools.   You have unexplained weight loss. MAKE SURE YOU:   Understand these instructions.  Will watch your condition.  Will get help right away if you are not doing well or get worse. Document Released: 05/31/2004 Document Revised: 09/07/2013  Document Reviewed: 06/14/2013 [facility] Patient Information 2015 [place], [place]. This information is not intended to replace advice given to you by your health care provider. Make sure you discuss any questions you have with your health care provider.  Abdominal Pain Many things can cause belly (abdominal) pain. Most times, the belly pain is not dangerous. Many cases of belly pain can be watched and treated at home. HOME CARE   Do not take medicines that help you go poop (laxatives) unless told to by your doctor.  Only take medicine as told by your doctor.  Eat or drink as told by your doctor. Your doctor will tell you if you should be on a special diet. GET HELP IF:  You do not know what is causing your belly pain.  You have belly pain while you are sick to your stomach (nauseous) or have runny poop (diarrhea).  You have pain while you pee or poop.  Your belly pain wakes you up at night.  You have belly pain that gets worse or better when you eat.  You have belly pain that gets worse when you eat fatty foods.  You have a fever. GET HELP RIGHT AWAY IF:   The pain does not go away within 2 hours.  You keep throwing up (vomiting).  The pain changes and is only in the right or left part of the belly.  You have bloody or tarry looking poop. MAKE SURE YOU:   Understand these instructions.  Will watch your condition.  Will get help right away if you are not doing well or get worse. Document Released: 02/19/2008 Document Revised: 09/07/2013 Document Reviewed: 05/12/2013 [facility] Patient Information 2015 [place], [place]. This information is not intended to replace advice given to you by your health care provider. Make sure you discuss any questions you have with your health care provider.  for additional evaluation and management.

## 2014-08-19 NOTE — ED Notes (Signed)
Patient with no complaints at this time. Respirations even and unlabored. Skin warm/dry. Discharge instructions reviewed with patient at this time. Patient given opportunity to voice concerns/ask questions. Patient discharged at this time and left Emergency Department with steady gait.   

## 2014-08-19 NOTE — ED Provider Notes (Signed)
CSN: [id number]     Arrival date & time 08/19/14  0807 History   First MD Initiated Contact with Patient 08/19/14 0813     Chief Complaint  Patient presents with  . Abdominal Pain  . Nausea     (Consider location/radiation/quality/duration/timing/severity/associated sxs/prior Treatment) Patient is a 43 y.o. female presenting with abdominal pain. The history is provided by the patient.  Abdominal Pain Pain location:  RLQ Pain quality: not sharp   Pain radiates to:  Periumbilical region Pain severity:  Severe Onset quality:  Gradual Timing:  Intermittent Progression:  Worsening Chronicity:  New Context: not alcohol use, not recent travel and not sick contacts   Worsened by:  Movement and palpation Associated symptoms: vomiting   Associated symptoms: no chest pain, no cough, no diarrhea, no dysuria, no fever, no hematemesis, no hematochezia, no hematuria and no shortness of breath   Risk factors: NSAID use   Risk factors: no alcohol abuse and no recent hospitalization     Past Medical History  Diagnosis Date  . Hypertension   . Diabetes mellitus   . Asthma   . COPD (chronic obstructive pulmonary disease)   . Restless leg   . High cholesterol   . Bulging disc   . Thyroid disease   . Obstructive sleep apnea     uses c-pap at night  . Obesity    Past Surgical History  Procedure Laterality Date  . Abdominal hysterectomy    . Cholecystectomy    . Knee surgery  2011    right  . Nose surgery    . Esophagogastroduodenoscopy N/A 10/27/2013    Procedure: ESOPHAGOGASTRODUODENOSCOPY (EGD);  Surgeon: [name], MD;  Location: [facility];  Service: Endoscopy;  Laterality: N/A;   Family History  Problem Relation Age of Onset  . Emphysema Mother   . Allergies Mother   . Allergies Child   . Heart disease Father     deceased at age 29 from heart attack  . Heart disease Mother   . Asthma Son   . Asthma Brother   . Asthma Brother   . Asthma Mother   . Ovarian cancer  Mother   . Lung cancer Maternal Aunt    History  Substance Use Topics  . Smoking status: Never Smoker   . Smokeless tobacco: Never Used  . Alcohol Use: No   OB History    No data available     Review of Systems  Constitutional: Negative for fever and activity change.       All ROS Neg except as noted in HPI  Eyes: Negative for photophobia and discharge.  Respiratory: Negative for cough, shortness of breath and wheezing.   Cardiovascular: Negative for chest pain and palpitations.  Gastrointestinal: Positive for vomiting and abdominal pain. Negative for diarrhea, blood in stool, hematochezia and hematemesis.  Genitourinary: Negative for dysuria, frequency and hematuria.  Musculoskeletal: Negative for back pain, arthralgias and neck pain.  Skin: Negative.   Neurological: Negative for dizziness, seizures and speech difficulty.  Psychiatric/Behavioral: Negative for hallucinations and confusion.      Allergies  Ampicillin; Codeine; and Sulfonamide derivatives  Home Medications   Prior to Admission medications   Medication Sig Start Date End Date Taking? Authorizing Provider  estradiol (ESTRADERM) 0.05 MG/24HR patch Place 1 patch onto the skin 2 (two) times a week.    Historical Provider, MD  FLUoxetine (PROZAC) 20 MG capsule Take 20 mg by mouth every other day. At bedtime    Historical Provider,  MD  fluticasone (FLONASE) 50 MCG/ACT nasal spray Place 2 sprays into both nostrils 2 (two) times daily.    Historical Provider, MD  ibuprofen (ADVIL,MOTRIN) 800 MG tablet Take 800 mg by mouth every 8 (eight) hours as needed for mild pain or moderate pain.    Historical Provider, MD  levothyroxine (SYNTHROID, LEVOTHROID) 75 MCG tablet Take 75 mcg by mouth every morning.    Historical Provider, MD  metoprolol (LOPRESSOR) 50 MG tablet Take 50 mg by mouth at bedtime.    Historical Provider, MD  OxyCODONE (OXYCONTIN) 10 mg T12A 12 hr tablet Take 10 mg by mouth every 12 (twelve) hours as needed  (for pain).     Historical Provider, MD  pantoprazole (PROTONIX) 40 MG tablet TAKE ONE TABLET BY MOUTH TWICE DAILY 30  TO  60  MINUTES  BEFORE  FIRST  MEAL  OF  THE  DAY 05/19/14   [name], MD  pantoprazole (PROTONIX) 40 MG tablet Take 1 tablet (40 mg total) by mouth 2 (two) times daily. 08/04/14   Amy S Esterwood, PA-C  progesterone (PROMETRIUM) 100 MG capsule Take 100 mg by mouth at bedtime.     Historical Provider, MD  testosterone cypionate (DEPOTESTOTERONE CYPIONATE) 100 MG/ML injection Inject 0.05 mg into the muscle every 7 (seven) days. For IM use only    Historical Provider, MD   BP 113/72 mmHg  Pulse 71  Temp(Src) 97.8 F (36.6 C) (Oral)  Resp 18  Ht 5\' 2"  (1.575 m)  Wt 282 lb (127.914 kg)  BMI 51.57 kg/m2  SpO2 98% Physical Exam  Constitutional: She is oriented to person, place, and time. She appears well-developed and well-nourished.  Non-toxic appearance.  HENT:  Head: Normocephalic.  Right Ear: Tympanic membrane and external ear normal.  Left Ear: Tympanic membrane and external ear normal.  Eyes: EOM and lids are normal. Pupils are equal, round, and reactive to light.  Neck: Normal range of motion. Neck supple. Carotid bruit is not present.  Cardiovascular: Normal rate, regular rhythm, normal heart sounds, intact distal pulses and normal pulses.   Pulmonary/Chest: Breath sounds normal. No respiratory distress.  Abdominal: Soft. Bowel sounds are normal. She exhibits no ascites and no pulsatile midline mass. There is no hepatosplenomegaly. There is tenderness in the right lower quadrant. There is no rigidity and no guarding.  Musculoskeletal: Normal range of motion.  Lymphadenopathy:       Head (right side): No submandibular adenopathy present.       Head (left side): No submandibular adenopathy present.    She has no cervical adenopathy.  Neurological: She is alert and oriented to person, place, and time. She has normal strength. No cranial nerve deficit or sensory  deficit.  Skin: Skin is warm and dry.  Psychiatric: She has a normal mood and affect. Her speech is normal.  Nursing note and vitals reviewed.   ED Course  Procedures (including critical care time) Labs Review Labs Reviewed  CBC WITH DIFFERENTIAL  COMPREHENSIVE METABOLIC PANEL  LIPASE, BLOOD  URINALYSIS, ROUTINE W REFLEX MICROSCOPIC    Imaging Review No results found.   EKG Interpretation None      MDM  Vital signs are well within normal limits. Pulse oximetry is 98% on room air. Within normal limits by my interpretation.  Urinalysis reveals a clear yellow specimen with a specific gravity of 1.005. The urine test was essentially within normal limits. Complete blood count is well within normal limits. Comprehensive metabolic panel is well within normal  limits. Lipase was normal at 32.  A CT scan of the abdomen and pelvis with contrast reveals a prominent liver, but no focal lesion liver lesions. There no kidney stones no uterine oral calculus noted. The appendix appears normal and there is no abscess appreciated. There is noticed increased stool burden present throughout the colon.  Suspect the patient has pain related to constipation, as the patient is on long-term medication for pain. The patient is advised to use the pain medicine only when needed. 2 increase water and juices. Increase fiber, and to use stool softener or laxative if needed. Patient is to follow-up with her primary physician. She is invited to return to the emergency department if any changes, problems, or concerns.    Final diagnoses:  RLQ abdominal pain  Constipation, unspecified constipation type    *I have reviewed nursing notes, vital signs, and all appropriate lab and imaging results for this patient.**    Lenox Ahr, PA-C 08/20/14 [place], DO 08/21/14 1328

## 2014-11-02 ENCOUNTER — Other Ambulatory Visit: Payer: Self-pay | Admitting: Internal Medicine

## 2014-12-08 DIAGNOSIS — E119 Type 2 diabetes mellitus without complications: Secondary | ICD-10-CM | POA: Diagnosis not present

## 2014-12-08 DIAGNOSIS — E559 Vitamin D deficiency, unspecified: Secondary | ICD-10-CM | POA: Diagnosis not present

## 2014-12-08 DIAGNOSIS — I10 Essential (primary) hypertension: Secondary | ICD-10-CM | POA: Diagnosis not present

## 2014-12-14 ENCOUNTER — Telehealth: Payer: Self-pay | Admitting: Internal Medicine

## 2014-12-14 NOTE — Telephone Encounter (Signed)
Received records from [facility] for appointment with Dr Debara Pickett on 01/11/15.  Records given to [facility] (medical records) for Dr Lysbeth Penner schedule on 01/11/15.  lp

## 2014-12-22 ENCOUNTER — Ambulatory Visit: Payer: Medicaid Other | Admitting: Internal Medicine

## 2014-12-25 ENCOUNTER — Emergency Department (HOSPITAL_COMMUNITY)
Admission: EM | Admit: 2014-12-25 | Discharge: 2014-12-25 | Disposition: A | Discharge status: Home / Self Care | Payer: Medicaid Other | Attending: Emergency Medicine | Admitting: Emergency Medicine

## 2014-12-25 ENCOUNTER — Encounter (HOSPITAL_COMMUNITY): Payer: Self-pay | Admitting: *Deleted

## 2014-12-25 DIAGNOSIS — I10 Essential (primary) hypertension: Secondary | ICD-10-CM | POA: Diagnosis not present

## 2014-12-25 DIAGNOSIS — G4733 Obstructive sleep apnea (adult) (pediatric): Secondary | ICD-10-CM | POA: Insufficient documentation

## 2014-12-25 DIAGNOSIS — E669 Obesity, unspecified: Secondary | ICD-10-CM | POA: Insufficient documentation

## 2014-12-25 DIAGNOSIS — G2581 Restless legs syndrome: Secondary | ICD-10-CM | POA: Insufficient documentation

## 2014-12-25 DIAGNOSIS — E559 Vitamin D deficiency, unspecified: Secondary | ICD-10-CM | POA: Insufficient documentation

## 2014-12-25 DIAGNOSIS — Z90710 Acquired absence of both cervix and uterus: Secondary | ICD-10-CM | POA: Diagnosis not present

## 2014-12-25 DIAGNOSIS — Z9889 Other specified postprocedural states: Secondary | ICD-10-CM | POA: Diagnosis not present

## 2014-12-25 DIAGNOSIS — E039 Hypothyroidism, unspecified: Secondary | ICD-10-CM | POA: Insufficient documentation

## 2014-12-25 DIAGNOSIS — Z7951 Long term (current) use of inhaled steroids: Secondary | ICD-10-CM | POA: Diagnosis not present

## 2014-12-25 DIAGNOSIS — E119 Type 2 diabetes mellitus without complications: Secondary | ICD-10-CM | POA: Insufficient documentation

## 2014-12-25 DIAGNOSIS — R197 Diarrhea, unspecified: Secondary | ICD-10-CM | POA: Diagnosis not present

## 2014-12-25 DIAGNOSIS — R112 Nausea with vomiting, unspecified: Secondary | ICD-10-CM | POA: Diagnosis not present

## 2014-12-25 DIAGNOSIS — J449 Chronic obstructive pulmonary disease, unspecified: Secondary | ICD-10-CM | POA: Insufficient documentation

## 2014-12-25 DIAGNOSIS — Z9049 Acquired absence of other specified parts of digestive tract: Secondary | ICD-10-CM | POA: Insufficient documentation

## 2014-12-25 DIAGNOSIS — Z9981 Dependence on supplemental oxygen: Secondary | ICD-10-CM | POA: Diagnosis not present

## 2014-12-25 DIAGNOSIS — Z793 Long term (current) use of hormonal contraceptives: Secondary | ICD-10-CM | POA: Insufficient documentation

## 2014-12-25 DIAGNOSIS — Z79899 Other long term (current) drug therapy: Secondary | ICD-10-CM | POA: Insufficient documentation

## 2014-12-25 LAB — COMPREHENSIVE METABOLIC PANEL
ALK PHOS: 93 U/L (ref 39–117)
ALT: 18 U/L (ref 0–35)
ANION GAP: 6 (ref 5–15)
AST: 22 U/L (ref 0–37)
Albumin: 3.4 g/dL — ABNORMAL LOW (ref 3.5–5.2)
BUN: 11 mg/dL (ref 6–23)
CO2: 22 mmol/L (ref 19–32)
CREATININE: 0.7 mg/dL (ref 0.50–1.10)
Calcium: 8.1 mg/dL — ABNORMAL LOW (ref 8.4–10.5)
Chloride: 113 mmol/L — ABNORMAL HIGH (ref 96–112)
GFR calc Af Amer: >90 mL/min (ref >90)
GFR calc non Af Amer: >90 mL/min (ref >90)
Glucose, Bld: 119 mg/dL — ABNORMAL HIGH (ref 70–99)
Potassium: 3.8 mmol/L (ref 3.5–5.1)
Sodium: 141 mmol/L (ref 135–145)
Total Bilirubin: 0.4 mg/dL (ref 0.3–1.2)
Total Protein: 6.3 g/dL (ref 6.0–8.3)

## 2014-12-25 LAB — CBC WITH DIFFERENTIAL/PLATELET
Basophils Absolute: 0 10*3/uL (ref 0.0–0.1)
Basophils Relative: 0 % (ref 0–1)
EOS PCT: 2 % (ref 0–5)
Eosinophils Absolute: 0.1 10*3/uL (ref 0.0–0.7)
HCT: 40.1 % (ref 36.0–46.0)
HEMOGLOBIN: 13.3 g/dL (ref 12.0–15.0)
LYMPHS ABS: 1.3 10*3/uL (ref 0.7–4.0)
Lymphocytes Relative: 25 % (ref 12–46)
MCH: 28.9 pg (ref 26.0–34.0)
MCHC: 33.2 g/dL (ref 30.0–36.0)
MCV: 87 fL (ref 78.0–100.0)
MONOS PCT: 11 % (ref 3–12)
Monocytes Absolute: 0.5 10*3/uL (ref 0.1–1.0)
NEUTROS PCT: 62 % (ref 43–77)
Neutro Abs: 3.1 10*3/uL (ref 1.7–7.7)
Platelets: 123 10*3/uL — ABNORMAL LOW (ref 150–400)
RBC: 4.61 MIL/uL (ref 3.87–5.11)
RDW: 14.1 % (ref 11.5–15.5)
WBC: 5.1 10*3/uL (ref 4.0–10.5)

## 2014-12-25 MED ORDER — PROMETHAZINE HCL 25 MG/ML IJ SOLN
25.0000 mg | Freq: Once | INTRAMUSCULAR | Status: AC
  Administered 2014-12-25: 25 mg via INTRAVENOUS
  Filled 2014-12-25: qty 1

## 2014-12-25 MED ORDER — METOCLOPRAMIDE HCL 10 MG PO TABS: 10.0000 mg | ORAL_TABLET | Freq: Four times a day (QID) | ORAL | Status: DC | PRN

## 2014-12-25 MED ORDER — SODIUM CHLORIDE 0.9 % IV SOLN
1000.0000 mL | Freq: Once | INTRAVENOUS | Status: AC
  Administered 2014-12-25: 1000 mL via INTRAVENOUS

## 2014-12-25 MED ORDER — ONDANSETRON HCL 4 MG/2ML IJ SOLN: 4.0000 mg | Freq: Once | INTRAMUSCULAR | Status: DC

## 2014-12-25 MED ORDER — LOPERAMIDE HCL 2 MG PO CAPS
4.0000 mg | ORAL_CAPSULE | Freq: Once | ORAL | Status: AC
  Administered 2014-12-25: 4 mg via ORAL
  Filled 2014-12-25: qty 2

## 2014-12-25 MED ORDER — SODIUM CHLORIDE 0.9 % IV SOLN
1000.0000 mL | INTRAVENOUS | Status: DC
  Administered 2014-12-25: 1000 mL via INTRAVENOUS

## 2014-12-25 MED ORDER — METOCLOPRAMIDE HCL 5 MG/ML IJ SOLN
10.0000 mg | Freq: Once | INTRAMUSCULAR | Status: AC
  Administered 2014-12-25: 10 mg via INTRAVENOUS
  Filled 2014-12-25: qty 2

## 2014-12-25 MED ORDER — DIPHENHYDRAMINE HCL 50 MG/ML IJ SOLN
25.0000 mg | Freq: Once | INTRAMUSCULAR | Status: AC
  Administered 2014-12-25: 25 mg via INTRAVENOUS
  Filled 2014-12-25: qty 1

## 2014-12-25 NOTE — ED Notes (Signed)
Pt states she had diarrhea on Saturday around 5:30am and she began vomiting 2am this morning. Pt believes she has food poisoning.

## 2014-12-25 NOTE — ED Notes (Signed)
Pt up to restroom to have a bm. Pt given a moisture barrier cream for her bottom.

## 2014-12-25 NOTE — ED Provider Notes (Signed)
CSN: [id number]     Arrival date & time 12/25/14  0239 History   First MD Initiated Contact with Patient 12/25/14 0248     Chief Complaint  Patient presents with  . Emesis     (Consider location/radiation/quality/duration/timing/severity/associated sxs/prior Treatment) Patient is a 44 y.o. female presenting with vomiting. The history is provided by the patient.  Emesis She started having diarrhea at about 5:30 AM and had multiple episodes of diarrhea through the day. She also had nausea. She took 2 doses of ondansetron which should give temporary relief of nausea. She denies fever, chills, sweats. There has been some mild abdominal soreness. She denies arthralgias or myalgias. There've been no known sick contacts. She had eaten at a restaurant the night before and she stated that the last couple of bites of chicken tasted funny. Nonulcerated the restaurant cut sick, but no one else ate the chicken. She woke up at 2 AM with multiple episodes of vomiting.  Past Medical History  Diagnosis Date  . Hypertension   . Diabetes mellitus   . Asthma   . COPD (chronic obstructive pulmonary disease)   . Restless leg   . High cholesterol   . Bulging disc   . Hypothyroidism   . Obstructive sleep apnea     uses c-pap at night  . Obesity   . Vitamin D deficiency   . Lumbosacral spondylosis without myelopathy    Past Surgical History  Procedure Laterality Date  . Abdominal hysterectomy    . Cholecystectomy    . Knee surgery  2011    right  . Nose surgery    . Esophagogastroduodenoscopy N/A 10/27/2013    Procedure: ESOPHAGOGASTRODUODENOSCOPY (EGD);  Surgeon: [name], MD;  Location: [facility];  Service: Endoscopy;  Laterality: N/A;   Family History  Problem Relation Age of Onset  . Emphysema Mother   . Allergies Mother   . Allergies Child   . Heart disease Father     deceased at age 34 from heart attack  . Heart disease Mother   . Asthma Son   . Asthma Brother   . Asthma  Brother   . Asthma Mother   . Ovarian cancer Mother   . Lung cancer Maternal Aunt    History  Substance Use Topics  . Smoking status: Never Smoker   . Smokeless tobacco: Never Used  . Alcohol Use: No   OB History    No data available     Review of Systems  Gastrointestinal: Positive for vomiting.  All other systems reviewed and are negative.     Allergies  Ampicillin; Codeine; and Sulfonamide derivatives  Home Medications   Prior to Admission medications   Medication Sig Start Date End Date Taking? Authorizing Provider  Cholecalciferol (VITAMIN D-3 PO) Take 2 tablets by mouth daily.    Historical Provider, MD  diazepam (VALIUM) 5 MG tablet Take 5 mg by mouth 2 (two) times daily as needed for anxiety or muscle spasms.    Historical Provider, MD  estradiol (ESTRADERM) 0.05 MG/24HR patch Place 1 patch onto the skin 2 (two) times a week.    Historical Provider, MD  FLUoxetine (PROZAC) 20 MG capsule Take 20 mg by mouth at bedtime. At bedtime    Historical Provider, MD  fluticasone (FLONASE) 50 MCG/ACT nasal spray Place 2 sprays into both nostrils 2 (two) times daily.    Historical Provider, MD  ibuprofen (ADVIL,MOTRIN) 800 MG tablet Take 800 mg by mouth every 8 (eight) hours as  needed for mild pain or moderate pain.    Historical Provider, MD  levothyroxine (SYNTHROID, LEVOTHROID) 75 MCG tablet Take 75 mcg by mouth every morning.    Historical Provider, MD  metoprolol (LOPRESSOR) 50 MG tablet Take 50 mg by mouth at bedtime.    Historical Provider, MD  OxyCODONE (OXYCONTIN) 10 mg T12A 12 hr tablet Take 10 mg by mouth every 12 (twelve) hours as needed (for pain).     Historical Provider, MD  pantoprazole (PROTONIX) 40 MG tablet TAKE ONE TABLET BY MOUTH TWICE DAILY 30  TO  60  MINUTES  BEFORE  FIRST  MEAL  OF  THE  DAY Patient not taking: Reported on 08/19/2014 05/19/14   [name], MD  pantoprazole (PROTONIX) 40 MG tablet Take 1 tablet (40 mg total) by mouth 2 (two) times daily.  08/04/14   Amy S Esterwood, PA-C  progesterone (PROMETRIUM) 100 MG capsule Take 100 mg by mouth at bedtime.     Historical Provider, MD  testosterone cypionate (DEPOTESTOTERONE CYPIONATE) 100 MG/ML injection Inject 0.05 mg into the muscle every 7 (seven) days. For IM use only    Historical Provider, MD   BP 126/82 mmHg  Pulse 110  Temp(Src) 98.5 F (36.9 C)  Resp 16  Ht 5\' 2"  (1.575 m)  Wt 302 lb (136.986 kg)  BMI 55.22 kg/m2  SpO2 96% Physical Exam  Nursing note and vitals reviewed.   morbidly obese 44 year old female, resting comfortably and in no acute distress. Vital signs are significant for tachycardia. Oxygen saturation is 96%, which is normal. Head is normocephalic and atraumatic. PERRLA, EOMI. Oropharynx is clear. Neck is nontender and supple without adenopathy or JVD. Back is nontender and there is no CVA tenderness. Lungs are clear without rales, wheezes, or rhonchi. Chest is nontender. Heart has regular rate and rhythm without murmur. Abdomen is soft, flat, nontender without masses or hepatosplenomegaly and peristalsis is hypoactive. Extremities have no cyanosis or edema, full range of motion is present. Skin is warm and dry without rash. Neurologic: Mental status is normal, cranial nerves are intact, there are no motor or sensory deficits.  ED Course  Procedures (including critical care time) Labs Review Results for orders placed or performed during the hospital encounter of 12/25/14  Comprehensive metabolic panel  Result Value Ref Range   Sodium 141 135 - 145 mmol/L   Potassium 3.8 3.5 - 5.1 mmol/L   Chloride 113 (H) 96 - 112 mmol/L   CO2 22 19 - 32 mmol/L   Glucose, Bld 119 (H) 70 - 99 mg/dL   BUN 11 6 - 23 mg/dL   Creatinine, Ser 0.70 0.50 - 1.10 mg/dL   Calcium 8.1 (L) 8.4 - 10.5 mg/dL   Total Protein 6.3 6.0 - 8.3 g/dL   Albumin 3.4 (L) 3.5 - 5.2 g/dL   AST 22 0 - 37 U/L   ALT 18 0 - 35 U/L   Alkaline Phosphatase 93 39 - 117 U/L   Total Bilirubin 0.4  0.3 - 1.2 mg/dL   GFR calc non Af Amer >90 >90 mL/min   GFR calc Af Amer >90 >90 mL/min   Anion gap 6 5 - 15  CBC with Differential  Result Value Ref Range   WBC 5.1 4.0 - 10.5 K/uL   RBC 4.61 3.87 - 5.11 MIL/uL   Hemoglobin 13.3 12.0 - 15.0 g/dL   HCT 40.1 36.0 - 46.0 %   MCV 87.0 78.0 - 100.0 fL   MCH  28.9 26.0 - 34.0 pg   MCHC 33.2 30.0 - 36.0 g/dL   RDW 14.1 11.5 - 15.5 %   Platelets 123 (L) 150 - 400 K/uL   Neutrophils Relative % 62 43 - 77 %   Neutro Abs 3.1 1.7 - 7.7 K/uL   Lymphocytes Relative 25 12 - 46 %   Lymphs Abs 1.3 0.7 - 4.0 K/uL   Monocytes Relative 11 3 - 12 %   Monocytes Absolute 0.5 0.1 - 1.0 K/uL   Eosinophils Relative 2 0 - 5 %   Eosinophils Absolute 0.1 0.0 - 0.7 K/uL   Basophils Relative 0 0 - 1 %   Basophils Absolute 0.0 0.0 - 0.1 K/uL   MDM   Final diagnoses:  Nausea vomiting and diarrhea    Nausea, vomiting, diarrhea in pattern that could be consistent with either food poisoning or viral gastroenteritis. No evidence of any more serious conditions. She will be given IV fluids, promethazine for nausea, and loperamide for diarrhea.  She had only partial relief of nausea with promethazine. She was given metoclopramide with excellent relief. Laboratory workup is unremarkable. She is discharged with prescription for metoclopramide and is told to use over-the-counter loperamide as needed.  Delora Fuel, MD 84/12/82 0813

## 2014-12-25 NOTE — ED Notes (Signed)
Lab back in to draw blood?

## 2014-12-25 NOTE — ED Notes (Signed)
Pt requesting more nausea medicine, edp notified

## 2014-12-25 NOTE — Discharge Instructions (Signed)
Take loperamide (Imodium AD) as needed for diarrhea. ° °Nausea and Vomiting °Nausea is a sick feeling that often comes before throwing up (vomiting). Vomiting is a reflex where stomach contents come out of your mouth. Vomiting can cause severe loss of body fluids (dehydration). Children and elderly adults can become dehydrated quickly, especially if they also have diarrhea. Nausea and vomiting are symptoms of a condition or disease. It is important to find the cause of your symptoms. °CAUSES  °· Direct irritation of the stomach lining. This irritation can result from increased acid production (gastroesophageal reflux disease), infection, food poisoning, taking certain medicines (such as nonsteroidal anti-inflammatory drugs), alcohol use, or tobacco use. °· Signals from the brain. These signals could be caused by a headache, heat exposure, an inner ear disturbance, increased pressure in the brain from injury, infection, a tumor, or a concussion, pain, emotional stimulus, or metabolic problems. °· An obstruction in the gastrointestinal tract (bowel obstruction). °· Illnesses such as diabetes, hepatitis, gallbladder problems, appendicitis, kidney problems, cancer, sepsis, atypical symptoms of a heart attack, or eating disorders. °· Medical treatments such as chemotherapy and radiation. °· Receiving medicine that makes you sleep (general anesthetic) during surgery. °DIAGNOSIS °Your caregiver may ask for tests to be done if the problems do not improve after a few days. Tests may also be done if symptoms are severe or if the reason for the nausea and vomiting is not clear. Tests may include: °· Urine tests. °· Blood tests. °· Stool tests. °· Cultures (to look for evidence of infection). °· X-rays or other imaging studies. °Test results can help your caregiver make decisions about treatment or the need for additional tests. °TREATMENT °You need to stay well hydrated. Drink frequently but in small amounts. You may wish to  drink water, sports drinks, clear broth, or eat frozen ice pops or gelatin dessert to help stay hydrated. When you eat, eating slowly may help prevent nausea. There are also some antinausea medicines that may help prevent nausea. °HOME CARE INSTRUCTIONS  °· Take all medicine as directed by your caregiver. °· If you do not have an appetite, do not force yourself to eat. However, you must continue to drink fluids. °· If you have an appetite, eat a normal diet unless your caregiver tells you differently. °· Eat a variety of complex carbohydrates (rice, wheat, potatoes, bread), lean meats, yogurt, fruits, and vegetables. °· Avoid high-fat foods because they are more difficult to digest. °· Drink enough water and fluids to keep your urine clear or pale yellow. °· If you are dehydrated, ask your caregiver for specific rehydration instructions. Signs of dehydration may include: °· Severe thirst. °· Dry lips and mouth. °· Dizziness. °· Dark urine. °· Decreasing urine frequency and amount. °· Confusion. °· Rapid breathing or pulse. °SEEK IMMEDIATE MEDICAL CARE IF:  °· You have blood or brown flecks (like coffee grounds) in your vomit. °· You have black or bloody stools. °· You have a severe headache or stiff neck. °· You are confused. °· You have severe abdominal pain. °· You have chest pain or trouble breathing. °· You do not urinate at least once every 8 hours. °· You develop cold or clammy skin. °· You continue to vomit for longer than 24 to 48 hours. °· You have a fever. °MAKE SURE YOU:  °· Understand these instructions. °· Will watch your condition. °· Will get help right away if you are not doing well or get worse. °Document Released: 09/02/2005 Document Revised: 11/25/2011 Document Reviewed: 01/30/2011 °ExitCare® Patient   Information ©2015 [employer]. This information is not intended to replace advice given to you by your health care provider. Make sure you discuss any questions you have with your health care  provider. ° °Diarrhea °Diarrhea is frequent loose and watery bowel movements. It can cause you to feel weak and dehydrated. Dehydration can cause you to become tired and thirsty, have a dry mouth, and have decreased urination that often is dark yellow. Diarrhea is a sign of another problem, most often an infection that will not last long. In most cases, diarrhea typically lasts 2-3 days. However, it can last longer if it is a sign of something more serious. It is important to treat your diarrhea as directed by your caregiver to lessen or prevent future episodes of diarrhea. °CAUSES  °Some common causes include: °· Gastrointestinal infections caused by viruses, bacteria, or parasites. °· Food poisoning or food allergies. °· Certain medicines, such as antibiotics, chemotherapy, and laxatives. °· Artificial sweeteners and fructose. °· Digestive disorders. °HOME CARE INSTRUCTIONS °· Ensure adequate fluid intake (hydration): Have 1 cup (8 oz) of fluid for each diarrhea episode. Avoid fluids that contain simple sugars or sports drinks, fruit juices, whole milk products, and sodas. Your urine should be clear or pale yellow if you are drinking enough fluids. Hydrate with an oral rehydration solution that you can purchase at pharmacies, retail stores, and online. You can prepare an oral rehydration solution at home by mixing the following ingredients together: °¨  - tsp table salt. °¨ ¾ tsp baking soda. °¨  tsp salt substitute containing potassium chloride. °¨ 1  tablespoons sugar. °¨ 1 L (34 oz) of water. °· Certain foods and beverages may increase the speed at which food moves through the gastrointestinal (GI) tract. These foods and beverages should be avoided and include: °¨ Caffeinated and alcoholic beverages. °¨ High-fiber foods, such as raw fruits and vegetables, nuts, seeds, and whole grain breads and cereals. °¨ Foods and beverages sweetened with sugar alcohols, such as xylitol, sorbitol, and mannitol. °· Some foods  may be well tolerated and may help thicken stool including: °¨ Starchy foods, such as rice, toast, pasta, low-sugar cereal, oatmeal, grits, baked potatoes, crackers, and bagels. °¨ Bananas. °¨ Applesauce. °· Add probiotic-rich foods to help increase healthy bacteria in the GI tract, such as yogurt and fermented milk products. °· Wash your hands well after each diarrhea episode. °· Only take over-the-counter or prescription medicines as directed by your caregiver. °· Take a warm bath to relieve any burning or pain from frequent diarrhea episodes. °SEEK IMMEDIATE MEDICAL CARE IF:  °· You are unable to keep fluids down. °· You have persistent vomiting. °· You have blood in your stool, or your stools are black and tarry. °· You do not urinate in 6-8 hours, or there is only a small amount of very dark urine. °· You have abdominal pain that increases or localizes. °· You have weakness, dizziness, confusion, or light-headedness. °· You have a severe headache. °· Your diarrhea gets worse or does not get better. °· You have a fever or persistent symptoms for more than 2-3 days. °· You have a fever and your symptoms suddenly get worse. °MAKE SURE YOU:  °· Understand these instructions. °· Will watch your condition. °· Will get help right away if you are not doing well or get worse. °Document Released: 08/23/2002 Document Revised: 01/17/2014 Document Reviewed: 05/10/2012 °ExitCare® Patient Information ©2015 [employer]. This information is not intended to replace advice given to you   by your health care provider. Make sure you discuss any questions you have with your health care provider. ° °Metoclopramide tablets °What is this medicine? °METOCLOPRAMIDE (met oh kloe PRA mide) is used to treat the symptoms of gastroesophageal reflux disease (GERD) like heartburn. It is also used to treat people with slow emptying of the stomach and intestinal tract. °This medicine may be used for other purposes; ask your health care provider  or pharmacist if you have questions. °COMMON BRAND NAME(S): Reglan °What should I tell my health care provider before I take this medicine? °They need to know if you have any of these conditions: °-breast cancer °-depression °-diabetes °-heart failure °-high blood pressure °-kidney disease °-liver disease °-Parkinson's disease or a movement disorder °-pheochromocytoma °-seizures °-stomach obstruction, bleeding, or perforation °-an unusual or allergic reaction to metoclopramide, procainamide, sulfites, other medicines, foods, dyes, or preservatives °-pregnant or trying to get pregnant °-breast-feeding °How should I use this medicine? °Take this medicine by mouth with a glass of water. Follow the directions on the prescription label. Take this medicine on an empty stomach, about 30 minutes before eating. Take your doses at regular intervals. Do not take your medicine more often than directed. Do not stop taking except on the advice of your doctor or health care professional. °A special MedGuide will be given to you by the pharmacist with each prescription and refill. Be sure to read this information carefully each time. °Talk to your pediatrician regarding the use of this medicine in children. Special care may be needed. °Overdosage: If you think you have taken too much of this medicine contact a poison control center or emergency room at once. °NOTE: This medicine is only for you. Do not share this medicine with others. °What if I miss a dose? °If you miss a dose, take it as soon as you can. If it is almost time for your next dose, take only that dose. Do not take double or extra doses. °What may interact with this medicine? °-acetaminophen °-cyclosporine °-digoxin °-medicines for blood pressure °-medicines for diabetes, including insulin °-medicines for hay fever and other allergies °-medicines for depression, especially an Monoamine Oxidase Inhibitor (MAOI) °-medicines for Parkinson's disease, like  levodopa °-medicines for sleep or for pain °-tetracycline °This list may not describe all possible interactions. Give your health care provider a list of all the medicines, herbs, non-prescription drugs, or dietary supplements you use. Also tell them if you smoke, drink alcohol, or use illegal drugs. Some items may interact with your medicine. °What should I watch for while using this medicine? °It may take a few weeks for your stomach condition to start to get better. However, do not take this medicine for longer than 12 weeks. The longer you take this medicine, and the more you take it, the greater your chances are of developing serious side effects. If you are an elderly patient, a female patient, or you have diabetes, you may be at an increased risk for side effects from this medicine. Contact your doctor immediately if you start having movements you cannot control such as lip smacking, rapid movements of the tongue, involuntary or uncontrollable movements of the eyes, head, arms and legs, or muscle twitches and spasms. °Patients and their families should watch out for worsening depression or thoughts of suicide. Also watch out for any sudden or severe changes in feelings such as feeling anxious, agitated, panicky, irritable, hostile, aggressive, impulsive, severely restless, overly excited and hyperactive, or not being able to sleep. If this   happens, especially at the beginning of treatment or after a change in dose, call your doctor. Do not treat yourself for high fever. Ask your doctor or health care professional for advice. You may get drowsy or dizzy. Do not drive, use machinery, or do anything that needs mental alertness until you know how this drug affects you. Do not stand or sit up quickly, especially if you are an older patient. This reduces the risk of dizzy or fainting spells. Alcohol can make you more drowsy and dizzy. Avoid alcoholic drinks. What side effects may I notice from receiving this  medicine? Side effects that you should report to your doctor or health care professional as soon as possible: -allergic reactions like skin rash, itching or hives, swelling of the face, lips, or tongue -abnormal production of milk in females -breast enlargement in both males and females -change in the way you walk -difficulty moving, speaking or swallowing -drooling, lip smacking, or rapid movements of the tongue -excessive sweating -fever -involuntary or uncontrollable movements of the eyes, head, arms and legs -irregular heartbeat or palpitations -muscle twitches and spasms -unusually weak or tired Side effects that usually do not require medical attention (report to your doctor or health care professional if they continue or are bothersome): -change in sex drive or performance -depressed mood -diarrhea -difficulty sleeping -headache -menstrual changes -restless or nervous This list may not describe all possible side effects. Call your doctor for medical advice about side effects. You may report side effects to FDA at 1-800-FDA-1088. Where should I keep my medicine? Keep out of the reach of children. Store at room temperature between 20 and 25 degrees C (68 and 77 degrees F). Protect from light. Keep container tightly closed. Throw away any unused medicine after the expiration date. NOTE: This sheet is a summary. It may not cover all possible information. If you have questions about this medicine, talk to your doctor, pharmacist, or health care provider.  2015, [employer]. (2011-12-31 13:04:38)

## 2014-12-26 ENCOUNTER — Encounter: Payer: Self-pay | Admitting: Internal Medicine

## 2014-12-26 ENCOUNTER — Telehealth: Payer: Self-pay | Admitting: Internal Medicine

## 2014-12-27 NOTE — Telephone Encounter (Signed)
Close encounter 

## 2015-01-11 ENCOUNTER — Ambulatory Visit: Payer: Medicaid Other | Admitting: Internal Medicine

## 2015-01-26 ENCOUNTER — Encounter: Payer: Self-pay | Admitting: Internal Medicine

## 2015-01-26 ENCOUNTER — Ambulatory Visit (INDEPENDENT_AMBULATORY_CARE_PROVIDER_SITE_OTHER): Payer: Medicaid Other | Admitting: Internal Medicine

## 2015-01-26 VITALS — BP 138/76 | HR 105 | Ht 62.5 in | Wt 306.5 lb

## 2015-01-26 DIAGNOSIS — Z79899 Other long term (current) drug therapy: Secondary | ICD-10-CM

## 2015-01-26 DIAGNOSIS — F411 Generalized anxiety disorder: Secondary | ICD-10-CM | POA: Diagnosis not present

## 2015-01-26 DIAGNOSIS — R079 Chest pain, unspecified: Secondary | ICD-10-CM | POA: Diagnosis not present

## 2015-01-26 DIAGNOSIS — R0602 Shortness of breath: Secondary | ICD-10-CM

## 2015-01-26 DIAGNOSIS — R0600 Dyspnea, unspecified: Secondary | ICD-10-CM | POA: Diagnosis not present

## 2015-01-26 DIAGNOSIS — R002 Palpitations: Secondary | ICD-10-CM

## 2015-01-26 DIAGNOSIS — R072 Precordial pain: Secondary | ICD-10-CM

## 2015-01-26 DIAGNOSIS — G4733 Obstructive sleep apnea (adult) (pediatric): Secondary | ICD-10-CM

## 2015-01-26 MED ORDER — ATORVASTATIN CALCIUM 40 MG PO TABS: 40.0000 mg | ORAL_TABLET | Freq: Every day | ORAL | Status: DC

## 2015-01-26 NOTE — Progress Notes (Signed)
OFFICE NOTE  Chief Complaint:  Chest pain, tachycardia, weight gain, DOE, palpitations  Primary Care Physician: Doree Albee, MD  HPI:  Kristina [name] is a 44 year old female who is seen today for chest pain, tachycardia, palpitations and worsening shortness of breath. Her past history is significant for morbid obesity, numerous GI problems including cholecystectomy and reflux, hypothyroidism, abnormal hormone levels on both estrogen, progesterone and testosterone therapy anxiety and depression and a very strong family history of coronary disease. Notes indicated that she previously saw Dr. Lattie Haw with lobar cardiology, however no prior coronary artery diagnosis is made. Family history significant for heart disease in both parents relatives brothers and sisters. All of this is at a younger age. Recently she's had 30-40 pound weight gain and progressive shortness of breath and chest pain. The pain can come on mostly at rest but can be associated with exertion. It's sharp and seems to go across the mid chest. Also noted tachycardia at rest and shortness of breath when walking up hills which limits her activities.  PMHx:  Past Medical History  Diagnosis Date  . Hypertension   . Diabetes mellitus   . Asthma   . COPD (chronic obstructive pulmonary disease)   . Restless leg   . High cholesterol   . Bulging disc   . Hypothyroidism   . Obstructive sleep apnea     uses c-pap at night  . Obesity   . Vitamin D deficiency   . Lumbosacral spondylosis without myelopathy     Past Surgical History  Procedure Laterality Date  . Abdominal hysterectomy    . Cholecystectomy    . Knee surgery  2011    right  . Nose surgery    . Esophagogastroduodenoscopy N/A 10/27/2013    Procedure: ESOPHAGOGASTRODUODENOSCOPY (EGD);  Surgeon: [name], MD;  Location: [facility];  Service: Endoscopy;  Laterality: N/A;    FAMHx:  Family History  Problem Relation Age of Onset  . Emphysema  Mother   . Allergies Mother   . Allergies Child   . Heart disease Father     deceased at age 90 from heart attack  . Heart disease Mother   . Asthma Son   . Asthma Brother   . Asthma Brother   . Asthma Mother   . Ovarian cancer Mother   . Lung cancer Maternal Aunt     SOCHx:   reports that she has never smoked. She has never used smokeless tobacco. She reports that she does not drink alcohol or use illicit drugs.  ALLERGIES:  Allergies  Allergen Reactions  . Ampicillin Anaphylaxis  . Codeine Hives and Shortness Of Breath  . Sulfonamide Derivatives Rash    ROS: A comprehensive review of systems was negative except for: Respiratory: positive for dyspnea on exertion Cardiovascular: positive for chest pain and palpitations  HOME MEDS: Current Outpatient Prescriptions  Medication Sig Dispense Refill  . Cholecalciferol (VITAMIN D-3 PO) Take 2 tablets by mouth daily.    . diazepam (VALIUM) 5 MG tablet Take 5 mg by mouth 2 (two) times daily as needed for anxiety or muscle spasms.    [name] estradiol (ESTRADERM) 0.05 MG/24HR patch Place 1 patch onto the skin 2 (two) times a week.    [name] FLUoxetine (PROZAC) 20 MG capsule Take 20 mg by mouth at bedtime. At bedtime    . fluticasone (FLONASE) 50 MCG/ACT nasal spray Place 2 sprays into both nostrils 2 (two) times daily.    [name] ibuprofen (ADVIL,MOTRIN) 800  MG tablet Take 800 mg by mouth every 8 (eight) hours as needed for mild pain or moderate pain.    [name] levothyroxine (SYNTHROID, LEVOTHROID) 75 MCG tablet Take 75 mcg by mouth every morning.    . metoCLOPramide (REGLAN) 10 MG tablet Take 1 tablet (10 mg total) by mouth every 6 (six) hours as needed for nausea. 30 tablet 0  . metoprolol (LOPRESSOR) 50 MG tablet Take 50 mg by mouth at bedtime.    [name] oxyCODONE (ROXICODONE) 15 MG immediate release tablet Take 15 mg by mouth as needed for pain.    . pantoprazole (PROTONIX) 40 MG tablet TAKE ONE TABLET BY MOUTH TWICE DAILY 30  TO  60  MINUTES  BEFORE   FIRST  MEAL  OF  THE  DAY 60 tablet 0  . pantoprazole (PROTONIX) 40 MG tablet Take 1 tablet (40 mg total) by mouth 2 (two) times daily. 60 tablet 2  . progesterone (PROMETRIUM) 100 MG capsule Take 100 mg by mouth at bedtime.     [name] testosterone cypionate (DEPOTESTOTERONE CYPIONATE) 100 MG/ML injection Inject 0.05 mg into the muscle every 7 (seven) days. For IM use only    . atorvastatin (LIPITOR) 40 MG tablet Take 1 tablet (40 mg total) by mouth daily. 90 tablet 3   Current Facility-Administered Medications  Medication Dose Route Frequency Provider Last Rate Last Dose  . influenza vac split quadrivalent PF (FLUARIX) injection 0.5 mL  0.5 mL Intramuscular Once Amy S Esterwood, PA-C        LABS/IMAGING: No results found for this or any previous visit (from the past 48 hour(s)). No results found.  WEIGHTS: Wt Readings from Last 3 Encounters:  01/26/15 306 lb 8 oz (139.027 kg)  12/25/14 302 lb (136.986 kg)  08/19/14 282 lb (127.914 kg)    VITALS: BP 138/76 mmHg  Pulse 105  Ht 5' 2.5" (1.588 m)  Wt 306 lb 8 oz (139.027 kg)  BMI 55.13 kg/m2  EXAM: General appearance: alert, no distress and morbidly obese Neck: no carotid bruit, no JVD and thyroid not enlarged, symmetric, no tenderness/mass/nodules Lungs: diminished breath sounds bilaterally Heart: regular rate and rhythm Abdomen: soft, non-tender; bowel sounds normal; no masses,  no organomegaly Extremities: extremities normal, atraumatic, no cyanosis or edema Pulses: 2+ and symmetric Skin: Skin color, texture, turgor normal. No rashes or lesions Neurologic: Grossly normal Psych: Mildly anxious, pressured speech  EKG: Sinus tachycardia 105  ASSESSMENT: 1. Chest pain 2. Sinus tachycardia/palpitations 3. Progressive shortness of breath 4. Morbid obesity 5. Anxiety  PLAN: 1.   Kristina [name] has a number of complaints including progressive chest pain and shortness of breath, tachycardia palpitations, all of which seem to be  new since November. At the time she was walking on a treadmill unable to do some exercise, however since then she's had weight gain which she attributes a lot to her steroid use. She's had progressive shortness of breath and is very concerned given her family history of coronary disease. She is quite convinced that there is something active with her coronary arteries. Based on her family risk and symptoms, I would recommend a Lexiscan nuclear stress test. She'll need a 2 day study to try to reduce bowel artifact. In addition will place of 2 day monitor to see if we can pick up any of her palpitations. I suspect these are simply inappropriate sinus tachycardia. Of note, she is on significant hormone therapy including testosterone, estrogen and progesterone. She is aware that there is some data  that this could increase her risk of coronary disease especially given the fact that she is at higher risk based on her family history. Should she be identified as having coronary artery disease, we may need to consider backing off for removing this treatment. I think she is hesitant to do this because she initially lost weight on this regimen however has gained that weight back.  Thanks for the kind referral. I'll be in contact with the results of her stress test and monitor.  I spent in excess of 60 minutes directly with the patient and reviewing numerous office records from her primary care provider, and personally reviewing films and multiple databases.  Pixie Casino, MD, [facility] Attending Cardiologist [place] 01/26/2015, 3:38 PM

## 2015-01-26 NOTE — Patient Instructions (Signed)
Your physician has requested that you have a lexiscan myoview. For further information please visit [email or web address]. Please follow instruction sheet, as given.  Your physician has recommended that you wear a holter monitor. Holter monitors are medical devices that record the heart's electrical activity. Doctors most often use these monitors to diagnose arrhythmias. Arrhythmias are problems with the speed or rhythm of the heartbeat. The monitor is a small, portable device. You can wear one while you do your normal daily activities. This is usually used to diagnose what is causing your chest pain and palpitations  START Lipitor 40mg  Daily  Your physician recommends that you return for lab work in: 1 week  Your physician recommends that you schedule a follow-up appointment after all of your testing is complete

## 2015-01-27 ENCOUNTER — Other Ambulatory Visit: Payer: Self-pay | Admitting: Internal Medicine

## 2015-01-27 ENCOUNTER — Ambulatory Visit (INDEPENDENT_AMBULATORY_CARE_PROVIDER_SITE_OTHER): Payer: Medicare Other

## 2015-01-27 ENCOUNTER — Encounter: Payer: Self-pay | Admitting: *Deleted

## 2015-01-27 DIAGNOSIS — R002 Palpitations: Secondary | ICD-10-CM

## 2015-01-27 DIAGNOSIS — R079 Chest pain, unspecified: Secondary | ICD-10-CM

## 2015-02-02 ENCOUNTER — Telehealth: Payer: Self-pay | Admitting: Internal Medicine

## 2015-02-02 NOTE — Telephone Encounter (Signed)
Sharyn Lull needs clarity on some lab orders for the pt who is currently in the office. Please f/u   Thanks

## 2015-02-02 NOTE — Telephone Encounter (Signed)
Per MICHELLE , PATIENT HAS 2 LAB SLIP  A BMET AND CMET. WHICH ONE TO DO? RN SPOKE TO dr HILTY's nurse JENNA RN DO CMET? --MICHELLE AWARE.

## 2015-02-03 LAB — BASIC METABOLIC PANEL
BUN: 13 mg/dL (ref 6–23)
CO2: 28 mEq/L (ref 19–32)
Calcium: 9.4 mg/dL (ref 8.4–10.5)
Chloride: 102 mEq/L (ref 96–112)
Creat: 0.88 mg/dL (ref 0.50–1.10)
Glucose, Bld: 75 mg/dL (ref 70–99)
POTASSIUM: 4.5 meq/L (ref 3.5–5.3)
Sodium: 142 mEq/L (ref 135–145)

## 2015-02-03 LAB — CK: CK TOTAL: 145 U/L (ref 7–177)

## 2015-02-03 LAB — COMPREHENSIVE METABOLIC PANEL
ALBUMIN: 4 g/dL (ref 3.5–5.2)
ALT: 17 U/L (ref 0–35)
AST: 17 U/L (ref 0–37)
Alkaline Phosphatase: 95 U/L (ref 39–117)
BUN: 13 mg/dL (ref 6–23)
CO2: 28 meq/L (ref 19–32)
Calcium: 9.4 mg/dL (ref 8.4–10.5)
Chloride: 102 mEq/L (ref 96–112)
Creat: 0.88 mg/dL (ref 0.50–1.10)
GLUCOSE: 75 mg/dL (ref 70–99)
POTASSIUM: 4.5 meq/L (ref 3.5–5.3)
SODIUM: 142 meq/L (ref 135–145)
Total Bilirubin: 0.2 mg/dL (ref 0.2–1.2)
Total Protein: 6.7 g/dL (ref 6.0–8.3)

## 2015-02-14 ENCOUNTER — Encounter: Payer: Self-pay | Admitting: Internal Medicine

## 2015-02-15 ENCOUNTER — Telehealth (HOSPITAL_COMMUNITY): Payer: Self-pay

## 2015-02-15 NOTE — Telephone Encounter (Addendum)
Patient given detailed instructions per Myocardial Perfusion Study Information Sheet for test on 02-16-2015 at 9:15am. Patient verbalized understanding. Oletta Lamas, Dannell Raczkowski A

## 2015-02-16 ENCOUNTER — Ambulatory Visit (HOSPITAL_COMMUNITY): Payer: Medicare Other | Attending: Cardiovascular Disease

## 2015-02-16 DIAGNOSIS — R079 Chest pain, unspecified: Secondary | ICD-10-CM | POA: Insufficient documentation

## 2015-02-16 DIAGNOSIS — R0600 Dyspnea, unspecified: Secondary | ICD-10-CM | POA: Diagnosis not present

## 2015-02-16 MED ORDER — REGADENOSON 0.4 MG/5ML IV SOLN
0.4000 mg | Freq: Once | INTRAVENOUS | Status: AC
  Administered 2015-02-16: 0.4 mg via INTRAVENOUS

## 2015-02-16 MED ORDER — TECHNETIUM TC 99M SESTAMIBI GENERIC - CARDIOLITE
33.0000 | Freq: Once | INTRAVENOUS | Status: AC | PRN
  Administered 2015-02-16: 33 via INTRAVENOUS

## 2015-02-17 ENCOUNTER — Ambulatory Visit (HOSPITAL_COMMUNITY): Payer: Medicaid Other | Attending: Internal Medicine

## 2015-02-17 LAB — MYOCARDIAL PERFUSION IMAGING
CHL CUP NUCLEAR SRS: 3
CHL CUP STRESS STAGE 1 DBP: 80 mmHg
CHL CUP STRESS STAGE 1 HR: 85 {beats}/min
CHL CUP STRESS STAGE 1 SPEED: 0 mph
CHL CUP STRESS STAGE 2 GRADE: 0 %
CHL CUP STRESS STAGE 2 HR: 84 {beats}/min
CHL CUP STRESS STAGE 3 GRADE: 0 %
CHL CUP STRESS STAGE 3 HR: 110 {beats}/min
CHL CUP STRESS STAGE 3 SPEED: 0 mph
CHL CUP STRESS STAGE 5 DBP: 84 mmHg
CHL CUP STRESS STAGE 5 SPEED: 0 mph
CHL CUP STRESS STAGE 6 SPEED: 0 mph
CSEPEW: 1 METS
CSEPPBP: 169 mmHg
CSEPPHR: 99 {beats}/min
LV dias vol: 111 mL
LV sys vol: 42 mL
NUC STRESS TID: 1.08
Nuc Stress EF: 62 %
Percent of predicted max HR: 55 %
RATE: 0.26
Rest HR: 81 {beats}/min
SDS: 4
SSS: 7
Stage 1 Grade: 0 %
Stage 1 SBP: 142 mmHg
Stage 2 Speed: 0 mph
Stage 4 DBP: 77 mmHg
Stage 4 Grade: 0 %
Stage 4 HR: 99 {beats}/min
Stage 4 SBP: 169 mmHg
Stage 4 Speed: 0 mph
Stage 5 Grade: 0 %
Stage 5 HR: 96 {beats}/min
Stage 5 SBP: 178 mmHg
Stage 6 Grade: 0 %
Stage 6 HR: 96 {beats}/min

## 2015-02-17 MED ORDER — TECHNETIUM TC 99M SESTAMIBI GENERIC - CARDIOLITE
33.0000 | Freq: Once | INTRAVENOUS | Status: AC | PRN
  Administered 2015-02-17: 33 via INTRAVENOUS

## 2015-02-27 ENCOUNTER — Ambulatory Visit (INDEPENDENT_AMBULATORY_CARE_PROVIDER_SITE_OTHER): Payer: Medicare Other | Admitting: Internal Medicine

## 2015-02-27 ENCOUNTER — Encounter (INDEPENDENT_AMBULATORY_CARE_PROVIDER_SITE_OTHER): Payer: Self-pay

## 2015-02-27 ENCOUNTER — Encounter: Payer: Self-pay | Admitting: Internal Medicine

## 2015-02-27 VITALS — BP 136/70 | HR 84 | Ht 63.0 in | Wt 310.5 lb

## 2015-02-27 DIAGNOSIS — R1031 Right lower quadrant pain: Secondary | ICD-10-CM

## 2015-02-27 DIAGNOSIS — R194 Change in bowel habit: Secondary | ICD-10-CM | POA: Diagnosis not present

## 2015-02-27 DIAGNOSIS — K219 Gastro-esophageal reflux disease without esophagitis: Secondary | ICD-10-CM

## 2015-02-27 MED ORDER — PANTOPRAZOLE SODIUM 20 MG PO TBEC: 20.0000 mg | DELAYED_RELEASE_TABLET | Freq: Every day | ORAL | Status: DC

## 2015-02-27 NOTE — Progress Notes (Signed)
Subjective:    Patient ID: Kristina [name], female    DOB: 1970-11-22, 44 y.o.   MRN: [id number] Cc: change in bowel habits HPI Alternating diarrhea and constipation with RLQ cramps. Urgent defecation at times. New for sveral months or more. Went to ED where CT and labs unrevealing 08/2014 - does have hepatic steatosis. Weight loss possible with dieting  but cannot sustain - wants to see about bariatric surgery. "I want to be healthier" - she pursued before but insurance did not cover. Now disbaled and on Medicare.  Not having any reflux sxs or dysphagia - would like to reduce PPI dose.  Allergies  Allergen Reactions  . Ampicillin Anaphylaxis  . Codeine Hives and Shortness Of Breath  . Sulfonamide Derivatives Rash   Outpatient Prescriptions Prior to Visit  Medication Sig Dispense Refill  . atorvastatin (LIPITOR) 40 MG tablet Take 1 tablet (40 mg total) by mouth daily. (Patient not taking: Reported on 03/01/2015) 90 tablet 3  . Cholecalciferol (VITAMIN D-3 PO) Take 2 tablets by mouth daily.    . diazepam (VALIUM) 5 MG tablet Take 5 mg by mouth 2 (two) times daily as needed for anxiety or muscle spasms.    [name] estradiol (ESTRADERM) 0.05 MG/24HR patch Place 1 patch onto the skin 2 (two) times a week.    . fluticasone (FLONASE) 50 MCG/ACT nasal spray Place 2 sprays into both nostrils 2 (two) times daily.    [name] ibuprofen (ADVIL,MOTRIN) 800 MG tablet Take 800 mg by mouth every 8 (eight) hours as needed for mild pain or moderate pain.    [name] levothyroxine (SYNTHROID, LEVOTHROID) 75 MCG tablet Take 75 mcg by mouth every morning.    . metoCLOPramide (REGLAN) 10 MG tablet Take 1 tablet (10 mg total) by mouth every 6 (six) hours as needed for nausea. 30 tablet 0  . metoprolol (LOPRESSOR) 50 MG tablet Take 50 mg by mouth at bedtime.    [name] oxyCODONE (ROXICODONE) 15 MG immediate release tablet Take 15 mg by mouth as needed for pain.    . progesterone (PROMETRIUM) 100 MG capsule Take 100 mg by mouth at  bedtime.     [name] testosterone cypionate (DEPOTESTOTERONE CYPIONATE) 100 MG/ML injection Inject 0.05 mg into the muscle every 7 (seven) days. For IM use only    . FLUoxetine (PROZAC) 20 MG capsule Take 20 mg by mouth at bedtime. At bedtime    . pantoprazole (PROTONIX) 40 MG tablet TAKE ONE TABLET BY MOUTH TWICE DAILY 30  TO  60  MINUTES  BEFORE  FIRST  MEAL  OF  THE  DAY 60 tablet 0  . pantoprazole (PROTONIX) 40 MG tablet Take 1 tablet (40 mg total) by mouth 2 (two) times daily. 60 tablet 2   Facility-Administered Medications Prior to Visit  Medication Dose Route Frequency Provider Last Rate Last Dose  . influenza vac split quadrivalent PF (FLUARIX) injection 0.5 mL  0.5 mL Intramuscular Once Amy Genia Harold, PA-C       Past Medical History  Diagnosis Date  . Hypertension   . Diabetes mellitus   . Asthma   . COPD (chronic obstructive pulmonary disease)   . Restless leg   . High cholesterol   . Bulging disc   . Hypothyroidism   . Obstructive sleep apnea     uses c-pap at night  . Obesity   . Vitamin D deficiency   . Lumbosacral spondylosis without myelopathy    Past Surgical History  Procedure Laterality Date  .  Abdominal hysterectomy  1996  . Cholecystectomy  1994  . Knee surgery Right 2011  . Nose surgery  2004  . Esophagogastroduodenoscopy N/A 10/27/2013    Procedure: ESOPHAGOGASTRODUODENOSCOPY (EGD);  Surgeon: [name], MD;  Location: [facility];  Service: Endoscopy;  Laterality: N/A;   History   Social History  . Marital Status: Divorced    Spouse Name: N/A  . Number of Children: 3  . Years of Education: college   Occupational History  . disabled    Social History Main Topics  . Smoking status: Never Smoker   . Smokeless tobacco: Never Used  . Alcohol Use: No  . Drug Use: No  . Sexual Activity: Yes    Birth Control/ Protection: Surgical   Other Topics Concern  . 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 Emphysema Mother   . Allergies Mother   . Allergies Child   . Heart disease Father     deceased at age 69 from heart attack  . Heart disease Mother   . Asthma Son   . Asthma Brother   . Asthma Brother   . Asthma Mother   . Ovarian cancer Mother   . Lung cancer Maternal Aunt   . Hypertension Mother   . Stroke Maternal Grandmother   . Heart disease Maternal Grandmother   . Cancer Maternal Grandfather   . Heart disease Paternal Grandmother   . Multiple sclerosis Paternal Grandmother   . Heart attack Paternal Grandfather   . Thyroid disease Brother     died at 83  . Heart attack Brother     died at 46  . COPD Brother   . Hypertension Brother   . COPD Sister     died at 82  . Sudden death Sister   . Hyperthyroidism Sister   . Hypertension Daughter   . Asthma Son      Review of Systems As above    Objective:   Physical Exam @BP  136/70 mmHg  Pulse 84  Ht 5\' 3"  (1.6 m)  Wt 310 lb 8 oz (140.842 kg)  BMI 55.02 kg/m2@  General:  NAD, morbidly obese Eyes:   anicteric Lungs:  clear Heart:: S1S2 no rubs, murmurs or gallops Abdomen:  Obese, soft and nontender, BS+    Data Reviewed:  08/2014 ED visit    Assessment & Plan:  Change in bowel habits - Plan: Ambulatory referral to Gastroenterology, 0.9 %  sodium chloride infusion  RLQ abdominal pain  Morbid obesity  Gastroesophageal reflux disease, esophagitis presence not specified - Plan: pantoprazole (PROTONIX) 20 MG tablet  Colonoscopy to investigate change in bowels and RLQ pain. The risks and benefits as well as alternatives of endoscopic procedure(s) have been discussed and reviewed. All questions answered. The patient agrees to proceed.  Reduce pantoprazole to 20 mg qd She asked about se's  She is to call [facility] re: bariatric program - # provided.  I appreciate the opportunity to care for you. AS:[id number],[email or web address] C, MD

## 2015-02-27 NOTE — Patient Instructions (Signed)
   You have been scheduled for a colonoscopy. Please follow written instructions given to you at your visit today.  Please pick up your prep supplies at the pharmacy within the next 1-3 days. If you use inhalers (even only as needed), please bring them with you on the day of your procedure.   Call [facility] [phone] to get set up for a class that will give you options for weight loss.   Dr. Carlean Purl  Is decreasing your pantoprazole to 20mg  daily. Rx sent in to your pharmacy.   I appreciate the opportunity to care for you. Silvano Rusk, M.D., [facility]

## 2015-03-01 ENCOUNTER — Encounter: Payer: Self-pay | Admitting: Internal Medicine

## 2015-03-03 ENCOUNTER — Encounter (HOSPITAL_COMMUNITY): Payer: Self-pay | Admitting: *Deleted

## 2015-03-08 DIAGNOSIS — I10 Essential (primary) hypertension: Secondary | ICD-10-CM | POA: Diagnosis not present

## 2015-03-08 DIAGNOSIS — E119 Type 2 diabetes mellitus without complications: Secondary | ICD-10-CM | POA: Diagnosis not present

## 2015-03-08 DIAGNOSIS — N959 Unspecified menopausal and perimenopausal disorder: Secondary | ICD-10-CM | POA: Diagnosis not present

## 2015-03-10 ENCOUNTER — Ambulatory Visit (HOSPITAL_COMMUNITY)
Admission: RE | Admit: 2015-03-10 | Discharge: 2015-03-10 | Disposition: A | Discharge status: Home / Self Care | Payer: Medicare Other | Source: Ambulatory Visit | Attending: Internal Medicine | Admitting: Internal Medicine

## 2015-03-10 ENCOUNTER — Encounter (HOSPITAL_COMMUNITY)
Admission: RE | Disposition: A | Discharge status: Home / Self Care | Payer: Self-pay | Source: Ambulatory Visit | Attending: Internal Medicine

## 2015-03-10 ENCOUNTER — Ambulatory Visit (HOSPITAL_COMMUNITY): Payer: Medicare Other | Admitting: Anesthesiology

## 2015-03-10 ENCOUNTER — Encounter (HOSPITAL_COMMUNITY): Payer: Self-pay

## 2015-03-10 DIAGNOSIS — Z9049 Acquired absence of other specified parts of digestive tract: Secondary | ICD-10-CM | POA: Insufficient documentation

## 2015-03-10 DIAGNOSIS — Z9989 Dependence on other enabling machines and devices: Secondary | ICD-10-CM | POA: Diagnosis not present

## 2015-03-10 DIAGNOSIS — Z6841 Body Mass Index (BMI) 40.0 and over, adult: Secondary | ICD-10-CM | POA: Diagnosis not present

## 2015-03-10 DIAGNOSIS — Z86010 Personal history of colonic polyps: Secondary | ICD-10-CM

## 2015-03-10 DIAGNOSIS — Z791 Long term (current) use of non-steroidal anti-inflammatories (NSAID): Secondary | ICD-10-CM | POA: Diagnosis not present

## 2015-03-10 DIAGNOSIS — Z79899 Other long term (current) drug therapy: Secondary | ICD-10-CM | POA: Insufficient documentation

## 2015-03-10 DIAGNOSIS — Z79890 Hormone replacement therapy (postmenopausal): Secondary | ICD-10-CM | POA: Diagnosis not present

## 2015-03-10 DIAGNOSIS — E119 Type 2 diabetes mellitus without complications: Secondary | ICD-10-CM | POA: Diagnosis not present

## 2015-03-10 DIAGNOSIS — D122 Benign neoplasm of ascending colon: Secondary | ICD-10-CM | POA: Diagnosis not present

## 2015-03-10 DIAGNOSIS — E039 Hypothyroidism, unspecified: Secondary | ICD-10-CM | POA: Insufficient documentation

## 2015-03-10 DIAGNOSIS — J449 Chronic obstructive pulmonary disease, unspecified: Secondary | ICD-10-CM | POA: Insufficient documentation

## 2015-03-10 DIAGNOSIS — R194 Change in bowel habit: Secondary | ICD-10-CM | POA: Diagnosis present

## 2015-03-10 DIAGNOSIS — F419 Anxiety disorder, unspecified: Secondary | ICD-10-CM | POA: Insufficient documentation

## 2015-03-10 DIAGNOSIS — E780 Pure hypercholesterolemia: Secondary | ICD-10-CM | POA: Insufficient documentation

## 2015-03-10 DIAGNOSIS — E559 Vitamin D deficiency, unspecified: Secondary | ICD-10-CM | POA: Diagnosis not present

## 2015-03-10 DIAGNOSIS — D125 Benign neoplasm of sigmoid colon: Secondary | ICD-10-CM | POA: Diagnosis not present

## 2015-03-10 DIAGNOSIS — F329 Major depressive disorder, single episode, unspecified: Secondary | ICD-10-CM | POA: Insufficient documentation

## 2015-03-10 DIAGNOSIS — M47817 Spondylosis without myelopathy or radiculopathy, lumbosacral region: Secondary | ICD-10-CM | POA: Insufficient documentation

## 2015-03-10 DIAGNOSIS — D123 Benign neoplasm of transverse colon: Secondary | ICD-10-CM | POA: Insufficient documentation

## 2015-03-10 DIAGNOSIS — G2581 Restless legs syndrome: Secondary | ICD-10-CM | POA: Insufficient documentation

## 2015-03-10 DIAGNOSIS — K219 Gastro-esophageal reflux disease without esophagitis: Secondary | ICD-10-CM | POA: Insufficient documentation

## 2015-03-10 DIAGNOSIS — J45909 Unspecified asthma, uncomplicated: Secondary | ICD-10-CM | POA: Diagnosis not present

## 2015-03-10 DIAGNOSIS — G4733 Obstructive sleep apnea (adult) (pediatric): Secondary | ICD-10-CM | POA: Diagnosis not present

## 2015-03-10 DIAGNOSIS — Z860101 Personal history of adenomatous and serrated colon polyps: Secondary | ICD-10-CM

## 2015-03-10 DIAGNOSIS — K5730 Diverticulosis of large intestine without perforation or abscess without bleeding: Secondary | ICD-10-CM | POA: Insufficient documentation

## 2015-03-10 DIAGNOSIS — K635 Polyp of colon: Secondary | ICD-10-CM | POA: Diagnosis not present

## 2015-03-10 DIAGNOSIS — I10 Essential (primary) hypertension: Secondary | ICD-10-CM | POA: Insufficient documentation

## 2015-03-10 DIAGNOSIS — M1990 Unspecified osteoarthritis, unspecified site: Secondary | ICD-10-CM | POA: Insufficient documentation

## 2015-03-10 HISTORY — DX: Personal history of adenomatous and serrated colon polyps: Z86.0101

## 2015-03-10 HISTORY — DX: Anxiety disorder, unspecified: F41.9

## 2015-03-10 HISTORY — DX: Personal history of colonic polyps: Z86.010

## 2015-03-10 HISTORY — DX: Other specified postprocedural states: R11.2

## 2015-03-10 HISTORY — DX: Depression, unspecified: F32.A

## 2015-03-10 HISTORY — DX: Other specified postprocedural states: Z98.890

## 2015-03-10 HISTORY — PX: COLONOSCOPY WITH PROPOFOL: SHX5780

## 2015-03-10 HISTORY — DX: Major depressive disorder, single episode, unspecified: F32.9

## 2015-03-10 LAB — GLUCOSE, CAPILLARY
GLUCOSE-CAPILLARY: 76 mg/dL (ref 65–99)
Glucose-Capillary: 82 mg/dL (ref 65–99)

## 2015-03-10 SURGERY — COLONOSCOPY WITH PROPOFOL
Anesthesia: Monitor Anesthesia Care

## 2015-03-10 MED ORDER — LACTATED RINGERS IV SOLN
INTRAVENOUS | Status: DC
  Administered 2015-03-10: 13:00:00 via INTRAVENOUS

## 2015-03-10 MED ORDER — ONDANSETRON HCL 4 MG/2ML IJ SOLN
INTRAMUSCULAR | Status: DC | PRN
  Administered 2015-03-10: 4 mg via INTRAVENOUS

## 2015-03-10 MED ORDER — PROPOFOL 10 MG/ML IV BOLUS
INTRAVENOUS | Status: AC
  Filled 2015-03-10: qty 20

## 2015-03-10 MED ORDER — SODIUM CHLORIDE 0.9 % IV SOLN: INTRAVENOUS | Status: DC

## 2015-03-10 MED ORDER — BENEFIBER PO POWD: ORAL | Status: DC

## 2015-03-10 MED ORDER — PROPOFOL INFUSION 10 MG/ML OPTIME
INTRAVENOUS | Status: DC | PRN
  Administered 2015-03-10: 200 ug/kg/min via INTRAVENOUS

## 2015-03-10 SURGICAL SUPPLY — 22 items

## 2015-03-10 NOTE — Anesthesia Postprocedure Evaluation (Signed)
  Anesthesia Post-op Note  Patient: Kristina [name]  Procedure(s) Performed: Procedure(s) (LRB): COLONOSCOPY WITH PROPOFOL (N/A)  Patient Location: PACU  Anesthesia Type: MAC  Level of Consciousness: awake and alert   Airway and Oxygen Therapy: Patient Spontanous Breathing  Post-op Pain: mild  Post-op Assessment: Post-op Vital signs reviewed, Patient's Cardiovascular Status Stable, Respiratory Function Stable, Patent Airway and No signs of Nausea or vomiting  Last Vitals:  Filed Vitals:   03/10/15 1404  BP:   Pulse: 85  Temp:   Resp: 16    Post-op Vital Signs: stable   Complications: No apparent anesthesia complications

## 2015-03-10 NOTE — Anesthesia Preprocedure Evaluation (Signed)
Anesthesia Evaluation  Patient identified by MRN, date of birth, ID band Patient awake    Reviewed: Allergy & Precautions, NPO status , Patient's Chart, lab work & pertinent test results  History of Anesthesia Complications (+) PONV and history of anesthetic complications  Airway Mallampati: II  TM Distance: >3 FB Neck ROM: Full    Dental no notable dental hx.    Pulmonary shortness of breath, asthma , sleep apnea and Continuous Positive Airway Pressure Ventilation , COPD breath sounds clear to auscultation  Pulmonary exam normal       Cardiovascular hypertension, Pt. on medications negative cardio ROS Normal cardiovascular examRhythm:Regular Rate:Normal     Neuro/Psych  Headaches, PSYCHIATRIC DISORDERS Anxiety Depression    GI/Hepatic Neg liver ROS, GERD-  Medicated,  Endo/Other  diabetes, Type 2, Oral Hypoglycemic AgentsHypothyroidism Morbid obesity  Renal/GU negative Renal ROS  negative genitourinary   Musculoskeletal  (+) Arthritis -, Osteoarthritis,    Abdominal (+) + obese,   Peds negative pediatric ROS (+)  Hematology negative hematology ROS (+)   Anesthesia Other Findings   Reproductive/Obstetrics negative OB ROS                             Anesthesia Physical Anesthesia Plan  ASA: III  Anesthesia Plan: MAC   Post-op Pain Management:    Induction: Intravenous  Airway Management Planned: Natural Airway  Additional Equipment:   Intra-op Plan:   Post-operative Plan:   Informed Consent: I have reviewed the patients History and Physical, chart, labs and discussed the procedure including the risks, benefits and alternatives for the proposed anesthesia with the patient or authorized representative who has indicated his/her understanding and acceptance.   Dental advisory given  Plan Discussed with: CRNA  Anesthesia Plan Comments:         Anesthesia Quick  Evaluation

## 2015-03-10 NOTE — H&P (View-Only) (Signed)
Subjective:    Patient ID: Kristina [name], female    DOB: 09/11/1971, 44 y.o.   MRN: [id number] Cc: change in bowel habits HPI Alternating diarrhea and constipation with RLQ cramps. Urgent defecation at times. New for sveral months or more. Went to ED where CT and labs unrevealing 08/2014 - does have hepatic steatosis. Weight loss possible with dieting  but cannot sustain - wants to see about bariatric surgery. "I want to be healthier" - she pursued before but insurance did not cover. Now disbaled and on Medicare.  Not having any reflux sxs or dysphagia - would like to reduce PPI dose.  Allergies  Allergen Reactions  . Ampicillin Anaphylaxis  . Codeine Hives and Shortness Of Breath  . Sulfonamide Derivatives Rash   Outpatient Prescriptions Prior to Visit  Medication Sig Dispense Refill  . atorvastatin (LIPITOR) 40 MG tablet Take 1 tablet (40 mg total) by mouth daily. (Patient not taking: Reported on 03/01/2015) 90 tablet 3  . Cholecalciferol (VITAMIN D-3 PO) Take 2 tablets by mouth daily.    . diazepam (VALIUM) 5 MG tablet Take 5 mg by mouth 2 (two) times daily as needed for anxiety or muscle spasms.    [name] estradiol (ESTRADERM) 0.05 MG/24HR patch Place 1 patch onto the skin 2 (two) times a week.    . fluticasone (FLONASE) 50 MCG/ACT nasal spray Place 2 sprays into both nostrils 2 (two) times daily.    [name] ibuprofen (ADVIL,MOTRIN) 800 MG tablet Take 800 mg by mouth every 8 (eight) hours as needed for mild pain or moderate pain.    [name] levothyroxine (SYNTHROID, LEVOTHROID) 75 MCG tablet Take 75 mcg by mouth every morning.    . metoCLOPramide (REGLAN) 10 MG tablet Take 1 tablet (10 mg total) by mouth every 6 (six) hours as needed for nausea. 30 tablet 0  . metoprolol (LOPRESSOR) 50 MG tablet Take 50 mg by mouth at bedtime.    [name] oxyCODONE (ROXICODONE) 15 MG immediate release tablet Take 15 mg by mouth as needed for pain.    . progesterone (PROMETRIUM) 100 MG capsule Take 100 mg by mouth at  bedtime.     [name] testosterone cypionate (DEPOTESTOTERONE CYPIONATE) 100 MG/ML injection Inject 0.05 mg into the muscle every 7 (seven) days. For IM use only    . FLUoxetine (PROZAC) 20 MG capsule Take 20 mg by mouth at bedtime. At bedtime    . pantoprazole (PROTONIX) 40 MG tablet TAKE ONE TABLET BY MOUTH TWICE DAILY 30  TO  60  MINUTES  BEFORE  FIRST  MEAL  OF  THE  DAY 60 tablet 0  . pantoprazole (PROTONIX) 40 MG tablet Take 1 tablet (40 mg total) by mouth 2 (two) times daily. 60 tablet 2   Facility-Administered Medications Prior to Visit  Medication Dose Route Frequency Provider Last Rate Last Dose  . influenza vac split quadrivalent PF (FLUARIX) injection 0.5 mL  0.5 mL Intramuscular Once Amy Genia Harold, PA-C       Past Medical History  Diagnosis Date  . Hypertension   . Diabetes mellitus   . Asthma   . COPD (chronic obstructive pulmonary disease)   . Restless leg   . High cholesterol   . Bulging disc   . Hypothyroidism   . Obstructive sleep apnea     uses c-pap at night  . Obesity   . Vitamin D deficiency   . Lumbosacral spondylosis without myelopathy    Past Surgical History  Procedure Laterality Date  .  Abdominal hysterectomy  1996  . Cholecystectomy  1994  . Knee surgery Right 2011  . Nose surgery  2004  . Esophagogastroduodenoscopy N/A 10/27/2013    Procedure: ESOPHAGOGASTRODUODENOSCOPY (EGD);  Surgeon: [name], MD;  Location: [facility];  Service: Endoscopy;  Laterality: N/A;   History   Social History  . Marital Status: Divorced    Spouse Name: N/A  . Number of Children: 3  . Years of Education: college   Occupational History  . disabled    Social History Main Topics  . Smoking status: Never Smoker   . Smokeless tobacco: Never Used  . Alcohol Use: No  . Drug Use: No  . Sexual Activity: Yes    Birth Control/ Protection: Surgical   Other Topics Concern  . 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 Emphysema Mother   . Allergies Mother   . Allergies Child   . Heart disease Father     deceased at age 43 from heart attack  . Heart disease Mother   . Asthma Son   . Asthma Brother   . Asthma Brother   . Asthma Mother   . Ovarian cancer Mother   . Lung cancer Maternal Aunt   . Hypertension Mother   . Stroke Maternal Grandmother   . Heart disease Maternal Grandmother   . Cancer Maternal Grandfather   . Heart disease Paternal Grandmother   . Multiple sclerosis Paternal Grandmother   . Heart attack Paternal Grandfather   . Thyroid disease Brother     died at 42  . Heart attack Brother     died at 1  . COPD Brother   . Hypertension Brother   . COPD Sister     died at 98  . Sudden death Sister   . Hyperthyroidism Sister   . Hypertension Daughter   . Asthma Son      Review of Systems As above    Objective:   Physical Exam @BP  136/70 mmHg  Pulse 84  Ht 5\' 3"  (1.6 m)  Wt 310 lb 8 oz (140.842 kg)  BMI 55.02 kg/m2@  General:  NAD, morbidly obese Eyes:   anicteric Lungs:  clear Heart:: S1S2 no rubs, murmurs or gallops Abdomen:  Obese, soft and nontender, BS+    Data Reviewed:  08/2014 ED visit    Assessment & Plan:  Change in bowel habits - Plan: Ambulatory referral to Gastroenterology, 0.9 %  sodium chloride infusion  RLQ abdominal pain  Morbid obesity  Gastroesophageal reflux disease, esophagitis presence not specified - Plan: pantoprazole (PROTONIX) 20 MG tablet  Colonoscopy to investigate change in bowels and RLQ pain. The risks and benefits as well as alternatives of endoscopic procedure(s) have been discussed and reviewed. All questions answered. The patient agrees to proceed.  Reduce pantoprazole to 20 mg qd She asked about se's  She is to call [facility] re: bariatric program - # provided.  I appreciate the opportunity to care for you. UE:[email or web address],[email or web address] C, MD

## 2015-03-10 NOTE — Op Note (Signed)
[facility] [place], [address]   COLONOSCOPY PROCEDURE REPORT  PATIENT: Kristina [name], Kristina [name]  MR#: [id number] BIRTHDATE: 24-Nov-1970 , 31  yrs. old GENDER: female ENDOSCOPIST: [name], MD, [facility] PROCEDURE DATE:  03/10/2015 PROCEDURE:   Colonoscopy with snare polypectomy and Colonoscopy, diagnostic First Screening Colonoscopy - Avg.  risk and is 50 yrs.  old or older - No.  Prior Negative Screening - Now for repeat screening. N/A  History of Adenoma - Now for follow-up colonoscopy & has been > or = to 3 yrs.  N/A  Polyps removed today? Yes ASA CLASS:   Class II INDICATIONS:change in bowel habits. MEDICATIONS: Monitored anesthesia care and Per Anesthesia  DESCRIPTION OF PROCEDURE:   After the risks benefits and alternatives of the procedure were thoroughly explained, informed consent was obtained.  The digital rectal exam revealed no abnormalities of the rectum.   The Pentax Adult Colon [id number] endoscope was introduced through the anus and advanced to the terminal ileum which was intubated for a short distance. No adverse events experienced.   The quality of the prep was good.  (MiraLax was used)  The instrument was then slowly withdrawn as the colon was fully examined. Estimated blood loss is zero unless otherwise noted in this procedure report.   COLON FINDINGS: 1) Three polyps - flat ascending 8-10 mm, 6 mm transverse and 3 mm sigmoid all removed by cold snare and completely sent to pathology 2) Sigmoid diverticulosis 3) Otherwise normal including terminal ileum.  Retroflexed views revealed no abnormalities. The time to cecum = 4.1 Withdrawal time = 17.1   The scope was withdrawn and the procedure completed. COMPLICATIONS: There were no immediate complications.  ENDOSCOPIC IMPRESSION: 1) Three polyps - flat ascending 8-10 mm, 6 mm transverse and 3 mm sigmoid all removed by cold snare and completely sent to pathology 2) Sigmoid  diverticulosis 3) Otherwise normal including terminal ileum  RECOMMENDATIONS: Benefiber 2 tbsp daily Repeat colonoscopy pending pathology of polyps  eSigned:  [name], MD, [facility] 03/10/2015 1:50 PM   cc: The Patient

## 2015-03-10 NOTE — Discharge Instructions (Signed)
°  I found and removed 3 small polyps - theyt were not causing problems. You also have a condition called diverticulosis - common and not usually a problem. It could be causing some of your bowel issues.Please read the handout provided.  Please start taking Benefiber 2 tablespoons each day to regulate your bowels.  I will let you know pathology results and when to have another routine colonoscopy by mail.  I appreciate the opportunity to care for you. [name], MD, FACG   YOU HAD AN ENDOSCOPIC PROCEDURE TODAY: Refer to the procedure report and other information in the discharge instructions given to you for any specific questions about what was found during the examination. If this information does not answer your questions, please call Dr. Celesta Aver office at [phone] to clarify.   YOU SHOULD EXPECT: Some feelings of bloating in the abdomen. Passage of more gas than usual. Walking can help get rid of the air that was put into your GI tract during the procedure and reduce the bloating. If you had a lower endoscopy (such as a colonoscopy or flexible sigmoidoscopy) you may notice spotting of blood in your stool or on the toilet paper. Some abdominal soreness may be present for a day or two, also.  DIET: Your first meal following the procedure should be a light meal and then it is ok to progress to your normal diet. A half-sandwich or bowl of soup is an example of a good first meal. Heavy or fried foods are harder to digest and may make you feel nauseous or bloated. Drink plenty of fluids but you should avoid alcoholic beverages for 24 hours.   ACTIVITY: Your care partner should take you home directly after the procedure. You should plan to take it easy, moving slowly for the rest of the day. You can resume normal activity the day after the procedure however YOU SHOULD NOT DRIVE, use power tools, machinery or perform tasks that involve climbing or major physical exertion for 24 hours  (because of the sedation medicines used during the test).   SYMPTOMS TO REPORT IMMEDIATELY: A gastroenterologist can be reached at any hour. Please call [phone]  for any of the following symptoms:   Following upper endoscopy (EGD, EUS, ERCP, esophageal dilation) Vomiting of blood or coffee ground material  New, significant abdominal pain  New, significant chest pain or pain under the shoulder blades  Painful or persistently difficult swallowing  New shortness of breath  Black, tarry-looking or red, bloody stools  FOLLOW UP:  If any biopsies were taken you will be contacted by phone or by letter within the next 1-3 weeks. Call [phone]  if you have not heard about the biopsies in 3 weeks.  Please also call with any specific questions about appointments or follow up tests.

## 2015-03-10 NOTE — Interval H&P Note (Signed)
History and Physical Interval Note:  03/10/2015 12:47 PM  [place]  has presented today for surgery, with the diagnosis of Change in bowels  The various methods of treatment have been discussed with the patient and family. After consideration of risks, benefits and other options for treatment, the patient has consented to  Procedure(s): COLONOSCOPY WITH PROPOFOL (N/A) as a surgical intervention .  The patient's history has been reviewed, patient examined, no change in status, stable for surgery.  I have reviewed the patient's chart and labs.  Questions were answered to the patient's satisfaction.     Silvano Rusk

## 2015-03-10 NOTE — Transfer of Care (Signed)
Immediate Anesthesia Transfer of Care Note  Patient: Kristina [name]  Procedure(s) Performed: Procedure(s): COLONOSCOPY WITH PROPOFOL (N/A)  Patient Location: PACU  Anesthesia Type:MAC  Level of Consciousness:  sedated, patient cooperative and responds to stimulation  Airway & Oxygen Therapy:Patient Spontanous Breathing and Patient connected to face mask oxgen  Post-op Assessment:  Report given to PACU RN and Post -op Vital signs reviewed and stable  Post vital signs:  Reviewed and stable  Last Vitals:  Filed Vitals:   03/10/15 1229  BP: 129/69  Pulse: 84  Temp: 36.6 C  Resp: 17    Complications: No apparent anesthesia complications

## 2015-03-13 ENCOUNTER — Encounter (HOSPITAL_COMMUNITY): Payer: Self-pay | Admitting: Internal Medicine

## 2015-03-16 ENCOUNTER — Ambulatory Visit (INDEPENDENT_AMBULATORY_CARE_PROVIDER_SITE_OTHER): Payer: Medicare Other | Admitting: Internal Medicine

## 2015-03-16 ENCOUNTER — Encounter: Payer: Self-pay | Admitting: Internal Medicine

## 2015-03-16 ENCOUNTER — Encounter (HOSPITAL_COMMUNITY): Payer: Self-pay | Admitting: Internal Medicine

## 2015-03-16 DIAGNOSIS — G4733 Obstructive sleep apnea (adult) (pediatric): Secondary | ICD-10-CM | POA: Diagnosis not present

## 2015-03-16 DIAGNOSIS — F411 Generalized anxiety disorder: Secondary | ICD-10-CM

## 2015-03-16 DIAGNOSIS — R002 Palpitations: Secondary | ICD-10-CM

## 2015-03-16 NOTE — Progress Notes (Signed)
OFFICE NOTE  Chief Complaint:  Follow-up monitor  Primary Care Physician: Doree Albee, MD  HPI:  Kristina [name] is a 44 year old female who is seen today for chest pain, tachycardia, palpitations and worsening shortness of breath. Her past history is significant for morbid obesity, numerous GI problems including cholecystectomy and reflux, hypothyroidism, abnormal hormone levels on both estrogen, progesterone and testosterone therapy anxiety and depression and a very strong family history of coronary disease. Notes indicated that she previously saw Dr. Lattie Haw with lobar cardiology, however no prior coronary artery diagnosis is made. Family history significant for heart disease in both parents relatives brothers and sisters. All of this is at a younger age. Recently she's had 30-40 pound weight gain and progressive shortness of breath and chest pain. The pain can come on mostly at rest but can be associated with exertion. It's sharp and seems to go across the mid chest. Also noted tachycardia at rest and shortness of breath when walking up hills which limits her activities.  I the pleasure seeing Kristina [name] back in the office today. She underwent monitoring which failed to show any tachyarrhythmias or any significant findings other than one 2 second pause, which seem to happen at 2 AM during the monitoring. She recalls of the night and that positive episode she forgot to put on her CPAP and I suspect she was apneic for a period of time. With regards to her stress test this was negative for ischemia with preserved EF. This is reassuring and she reports her chest discomfort has improved somewhat but not totally gone away. Her palpitations are also somewhat better.  PMHx:  Past Medical History  Diagnosis Date  . Hypertension   . Asthma   . COPD (chronic obstructive pulmonary disease)   . Restless leg   . High cholesterol   . Bulging disc   . Hypothyroidism   . Obstructive sleep  apnea     uses c-pap at night  . Obesity   . Vitamin D deficiency   . PONV (postoperative nausea and vomiting)   . Diabetes mellitus     diet control only  . Lumbosacral spondylosis without myelopathy     history bulging disc-chronic pain(level 3 to 8).  . Depression   . Anxiety     Past Surgical History  Procedure Laterality Date  . Abdominal hysterectomy  1996  . Cholecystectomy  1994  . Nose surgery  2004  . Esophagogastroduodenoscopy N/A 10/27/2013    Procedure: ESOPHAGOGASTRODUODENOSCOPY (EGD);  Surgeon: [name], MD;  Location: [facility];  Service: Endoscopy;  Laterality: N/A;  . Knee arthroscopy Right   . Colonoscopy with propofol N/A 03/10/2015    Procedure: COLONOSCOPY WITH PROPOFOL;  Surgeon: [name], MD;  Location: [facility];  Service: Endoscopy;  Laterality: N/A;    FAMHx:  Family History  Problem Relation Age of Onset  . Emphysema Mother   . Allergies Mother   . Allergies Child   . Heart disease Father     deceased at age 58 from heart attack  . Heart disease Mother   . Asthma Son   . Asthma Brother   . Asthma Brother   . Asthma Mother   . Ovarian cancer Mother   . Lung cancer Maternal Aunt   . Hypertension Mother   . Stroke Maternal Grandmother   . Heart disease Maternal Grandmother   . Cancer Maternal Grandfather   . Heart disease Paternal Grandmother   . Multiple sclerosis  Paternal Grandmother   . Heart attack Paternal Grandfather   . Thyroid disease Brother     died at 73  . Heart attack Brother     died at 59  . COPD Brother   . Hypertension Brother   . COPD Sister     died at 30  . Sudden death Sister   . Hyperthyroidism Sister   . Hypertension Daughter   . Asthma Son     SOCHx:   reports that she has never smoked. She has never used smokeless tobacco. She reports that she does not drink alcohol or use illicit drugs.  ALLERGIES:  Allergies  Allergen Reactions  . Ampicillin Anaphylaxis  . Codeine Hives and  Shortness Of Breath  . Sulfonamide Derivatives Rash    ROS: A comprehensive review of systems was negative except for: Respiratory: positive for dyspnea on exertion Cardiovascular: positive for chest pain and palpitations  HOME MEDS: Current Outpatient Prescriptions  Medication Sig Dispense Refill  . albuterol (PROVENTIL HFA;VENTOLIN HFA) 108 (90 BASE) MCG/ACT inhaler Inhale 2 puffs into the lungs every 6 (six) hours as needed for wheezing or shortness of breath.    [name] atorvastatin (LIPITOR) 40 MG tablet Take 1 tablet (40 mg total) by mouth daily. 90 tablet 3  . Cholecalciferol (VITAMIN D-3 PO) Take 2 tablets by mouth daily.    . diazepam (VALIUM) 5 MG tablet Take 5 mg by mouth 2 (two) times daily as needed for anxiety or muscle spasms.    [name] estradiol (ESTRADERM) 0.05 MG/24HR patch Place 1 patch onto the skin 2 (two) times a week.    . fluticasone (FLONASE) 50 MCG/ACT nasal spray Place 2 sprays into both nostrils 2 (two) times daily.    [name] ibuprofen (ADVIL,MOTRIN) 800 MG tablet Take 800 mg by mouth every 8 (eight) hours as needed for mild pain or moderate pain.    [name] levothyroxine (SYNTHROID, LEVOTHROID) 75 MCG tablet Take 75 mcg by mouth every morning.    . metoCLOPramide (REGLAN) 10 MG tablet Take 1 tablet (10 mg total) by mouth every 6 (six) hours as needed for nausea. 30 tablet 0  . metoprolol (LOPRESSOR) 50 MG tablet Take 50 mg by mouth 2 (two) times daily.     . ondansetron (ZOFRAN) 4 MG tablet Take 4 mg by mouth every 8 (eight) hours as needed for nausea or vomiting.    . pantoprazole (PROTONIX) 20 MG tablet Take 1 tablet (20 mg total) by mouth daily before breakfast. 30 tablet 11  . progesterone (PROMETRIUM) 100 MG capsule Take 100 mg by mouth at bedtime.     [name] testosterone cypionate (DEPOTESTOTERONE CYPIONATE) 100 MG/ML injection Inject 0.05 mg into the muscle every 7 (seven) days. For IM use only    . Wheat Dextrin (BENEFIBER) POWD Take 2 tablespoons daily  0   No current  facility-administered medications for this visit.    LABS/IMAGING: No results found for this or any previous visit (from the past 48 hour(s)). No results found.  WEIGHTS: Wt Readings from Last 3 Encounters:  03/16/15 308 lb (139.708 kg)  02/27/15 310 lb 8 oz (140.842 kg)  02/16/15 306 lb (138.801 kg)    VITALS: BP 110/76 mmHg  Pulse 82  Ht 5\' 3"  (1.6 m)  Wt 308 lb (139.708 kg)  BMI 54.57 kg/m2  EXAM: Deferred  EKG: Deferred  ASSESSMENT: 1. Chest pain - negative nuclear stress test 2. Sinus tachycardia/palpitations 3. Progressive shortness of breath 4. Morbid obesity 5. Anxiety  PLAN: 1.   Mrs. Leven had a negative nuclear stress test which is reassuring. Her monitor failed to show any arrhythmias, however date she did have a 2 second pause which I believe is related to sleep apnea on the night that she did not wear her CPAP._The importance of continuing to wear CPAP which she expressed that she would. She needs to continue to work on exercise and weight loss which will ultimately be very helpful for her.  I'm happy to see her back on an as-needed basis.  Pixie Casino, MD, [facility] Attending Cardiologist [place] 03/16/2015, 12:54 PM

## 2015-03-16 NOTE — Patient Instructions (Signed)
Follow up as needed

## 2015-03-16 NOTE — Progress Notes (Signed)
Quick Note:  ssp and adenoma max 10 mm - repeat colonoscopy 2019 ______ 

## 2015-04-09 ENCOUNTER — Encounter (HOSPITAL_COMMUNITY): Payer: Self-pay | Admitting: *Deleted

## 2015-04-09 ENCOUNTER — Emergency Department (HOSPITAL_COMMUNITY)
Admission: EM | Admit: 2015-04-09 | Discharge: 2015-04-09 | Disposition: A | Discharge status: Home / Self Care | Payer: Medicare Other | Attending: Emergency Medicine | Admitting: Emergency Medicine

## 2015-04-09 DIAGNOSIS — Z86010 Personal history of colonic polyps: Secondary | ICD-10-CM | POA: Insufficient documentation

## 2015-04-09 DIAGNOSIS — E669 Obesity, unspecified: Secondary | ICD-10-CM | POA: Insufficient documentation

## 2015-04-09 DIAGNOSIS — R531 Weakness: Secondary | ICD-10-CM

## 2015-04-09 DIAGNOSIS — Z793 Long term (current) use of hormonal contraceptives: Secondary | ICD-10-CM | POA: Diagnosis not present

## 2015-04-09 DIAGNOSIS — G4733 Obstructive sleep apnea (adult) (pediatric): Secondary | ICD-10-CM | POA: Diagnosis not present

## 2015-04-09 DIAGNOSIS — M545 Low back pain, unspecified: Secondary | ICD-10-CM

## 2015-04-09 DIAGNOSIS — R2240 Localized swelling, mass and lump, unspecified lower limb: Secondary | ICD-10-CM | POA: Diagnosis not present

## 2015-04-09 DIAGNOSIS — Z9981 Dependence on supplemental oxygen: Secondary | ICD-10-CM | POA: Diagnosis not present

## 2015-04-09 DIAGNOSIS — Z880 Allergy status to penicillin: Secondary | ICD-10-CM | POA: Insufficient documentation

## 2015-04-09 DIAGNOSIS — I10 Essential (primary) hypertension: Secondary | ICD-10-CM | POA: Insufficient documentation

## 2015-04-09 DIAGNOSIS — J45901 Unspecified asthma with (acute) exacerbation: Secondary | ICD-10-CM | POA: Diagnosis not present

## 2015-04-09 DIAGNOSIS — E039 Hypothyroidism, unspecified: Secondary | ICD-10-CM | POA: Diagnosis not present

## 2015-04-09 DIAGNOSIS — G8929 Other chronic pain: Secondary | ICD-10-CM | POA: Insufficient documentation

## 2015-04-09 DIAGNOSIS — R109 Unspecified abdominal pain: Secondary | ICD-10-CM | POA: Insufficient documentation

## 2015-04-09 DIAGNOSIS — Z8659 Personal history of other mental and behavioral disorders: Secondary | ICD-10-CM | POA: Insufficient documentation

## 2015-04-09 DIAGNOSIS — Z79899 Other long term (current) drug therapy: Secondary | ICD-10-CM | POA: Insufficient documentation

## 2015-04-09 DIAGNOSIS — E780 Pure hypercholesterolemia: Secondary | ICD-10-CM | POA: Diagnosis not present

## 2015-04-09 DIAGNOSIS — E119 Type 2 diabetes mellitus without complications: Secondary | ICD-10-CM | POA: Insufficient documentation

## 2015-04-09 DIAGNOSIS — Z8719 Personal history of other diseases of the digestive system: Secondary | ICD-10-CM | POA: Diagnosis not present

## 2015-04-09 DIAGNOSIS — Z7951 Long term (current) use of inhaled steroids: Secondary | ICD-10-CM | POA: Insufficient documentation

## 2015-04-09 DIAGNOSIS — R2233 Localized swelling, mass and lump, upper limb, bilateral: Secondary | ICD-10-CM | POA: Diagnosis not present

## 2015-04-09 HISTORY — DX: Diverticulosis of intestine, part unspecified, without perforation or abscess without bleeding: K57.90

## 2015-04-09 HISTORY — DX: Allergic rhinitis, unspecified: J30.9

## 2015-04-09 HISTORY — DX: Cough: R05

## 2015-04-09 HISTORY — DX: Other chronic pain: G89.29

## 2015-04-09 HISTORY — DX: Chronic cough: R05.3

## 2015-04-09 HISTORY — DX: Other diseases of vocal cords: J38.3

## 2015-04-09 HISTORY — DX: Palpitations: R00.2

## 2015-04-09 HISTORY — DX: Dorsalgia, unspecified: M54.9

## 2015-04-09 HISTORY — DX: Reserved for inherently not codable concepts without codable children: IMO0001

## 2015-04-09 HISTORY — DX: Dyspnea, unspecified: R06.00

## 2015-04-09 LAB — URINALYSIS, ROUTINE W REFLEX MICROSCOPIC
Bilirubin Urine: NEGATIVE
Glucose, UA: NEGATIVE mg/dL
Hgb urine dipstick: NEGATIVE
KETONES UR: NEGATIVE mg/dL
Leukocytes, UA: NEGATIVE
Nitrite: NEGATIVE
Protein, ur: 30 mg/dL — AB
Specific Gravity, Urine: >1.03 — ABNORMAL HIGH (ref 1.005–1.030)
Urobilinogen, UA: 0.2 mg/dL (ref 0.0–1.0)
pH: 5.5 (ref 5.0–8.0)

## 2015-04-09 LAB — BASIC METABOLIC PANEL
Anion gap: 9 (ref 5–15)
BUN: 11 mg/dL (ref 6–20)
CO2: 25 mmol/L (ref 22–32)
CREATININE: 0.71 mg/dL (ref 0.44–1.00)
Calcium: 9.1 mg/dL (ref 8.9–10.3)
Chloride: 104 mmol/L (ref 101–111)
Glucose, Bld: 96 mg/dL (ref 65–99)
Potassium: 4 mmol/L (ref 3.5–5.1)
Sodium: 138 mmol/L (ref 135–145)

## 2015-04-09 LAB — CBC
HEMATOCRIT: 41.8 % (ref 36.0–46.0)
Hemoglobin: 13.7 g/dL (ref 12.0–15.0)
MCH: 28.2 pg (ref 26.0–34.0)
MCHC: 32.8 g/dL (ref 30.0–36.0)
MCV: 86 fL (ref 78.0–100.0)
Platelets: 248 10*3/uL (ref 150–400)
RBC: 4.86 MIL/uL (ref 3.87–5.11)
RDW: 13.5 % (ref 11.5–15.5)
WBC: 9.4 10*3/uL (ref 4.0–10.5)

## 2015-04-09 LAB — URINE MICROSCOPIC-ADD ON

## 2015-04-09 LAB — CBG MONITORING, ED: GLUCOSE-CAPILLARY: 98 mg/dL (ref 65–99)

## 2015-04-09 MED ORDER — METHOCARBAMOL 500 MG PO TABS: 1000.0000 mg | ORAL_TABLET | Freq: Four times a day (QID) | ORAL | Status: DC | PRN

## 2015-04-09 MED ORDER — METHOCARBAMOL 500 MG PO TABS
1000.0000 mg | ORAL_TABLET | Freq: Once | ORAL | Status: AC
Start: 2015-04-09 — End: 2015-04-09
  Administered 2015-04-09: 1000 mg via ORAL
  Filled 2015-04-09: qty 2

## 2015-04-09 MED ORDER — SODIUM CHLORIDE 0.9 % IV BOLUS (SEPSIS)
1000.0000 mL | Freq: Once | INTRAVENOUS | Status: AC
  Administered 2015-04-09: 1000 mL via INTRAVENOUS

## 2015-04-09 MED ORDER — IPRATROPIUM-ALBUTEROL 0.5-2.5 (3) MG/3ML IN SOLN
3.0000 mL | Freq: Once | RESPIRATORY_TRACT | Status: AC
  Administered 2015-04-09: 3 mL via RESPIRATORY_TRACT
  Filled 2015-04-09: qty 3

## 2015-04-09 MED ORDER — OXYCODONE HCL 5 MG PO TABS
15.0000 mg | ORAL_TABLET | Freq: Once | ORAL | Status: DC
  Filled 2015-04-09: qty 3

## 2015-04-09 NOTE — ED Notes (Signed)
Pt requesting something for pain, was told by EDP pt could take a dose of her own pain medication.  Pt says she didn't have any so I told Dr. Thurnell Garbe and she ordered her oxycodone IR 15mg .  While RN out of the room, pt called her daughter to bring her some pain medication.  Pt upset about her care, says she feels no better, and that we did nothing to help her.   Also upset because she woke her daughter up who works 3rd shift.  Dr. Thurnell Garbe aware and tried to explain treatment plan to pt and went over results but pt was very agitated.  Dr. Thurnell Garbe repeatedly explained reason for the treatment that she gave but pt was still very angry.   Pt requesting to speak to [facility]  Notified Gretchen Portela RN, Nursing supervisor.

## 2015-04-09 NOTE — ED Provider Notes (Signed)
CSN: [id number]     Arrival date & time 04/09/15  1030 History   First MD Initiated Contact with Patient 04/09/15 1145     Chief Complaint  Patient presents with  . Flank Pain  . Weakness     HPI Pt was seen at 1155. Per pt, c/o gradual onset and persistence of constant multiple symptoms that began "when I got home from the beach" 3 days ago. Pt's symptoms include: acute flair of her chronic LBP, "wheezing" yesterday only, generalized fatigue, and "swelling" of her hands and feet. States she "just doesn't feel good." Denies abd pain, no N/V/D, no CP/SOB, no focal motor weakness, no tingling/numbness in extremities, no saddle anesthesia, no incont of bowel/bladder, no dysuria/hematuria, no rash, no fevers.    Past Medical History  Diagnosis Date  . Hypertension   . Asthma   . Restless leg   . High cholesterol   . Bulging disc   . Hypothyroidism   . Obstructive sleep apnea     uses c-pap at night  . Obesity   . Vitamin D deficiency   . PONV (postoperative nausea and vomiting)   . Diabetes mellitus     diet control only  . Lumbosacral spondylosis without myelopathy     history bulging disc-chronic pain(level 3 to 8).  . Depression   . Anxiety   . Hx of adenomatous and sessile serrated colonic polyps   . Diverticulosis   . Normal cardiac stress test 02/2015    with normal EF  . Palpitations   . Chronic cough   . Allergic rhinitis   . Dyspnea     chronic  . Chronic back pain   . Vocal cord dysfunction    Past Surgical History  Procedure Laterality Date  . Abdominal hysterectomy  1996  . Cholecystectomy  1994  . Nose surgery  2004  . Esophagogastroduodenoscopy N/A 10/27/2013    Procedure: ESOPHAGOGASTRODUODENOSCOPY (EGD);  Surgeon: [name], MD;  Location: [facility];  Service: Endoscopy;  Laterality: N/A;  . Knee arthroscopy Right   . Colonoscopy with propofol N/A 03/10/2015    Procedure: COLONOSCOPY WITH PROPOFOL;  Surgeon: [name], MD;  Location: [facility];  Service: Endoscopy;  Laterality: N/A;   Family History  Problem Relation Age of Onset  . Emphysema Mother   . Allergies Mother   . Allergies Child   . Heart disease Father     deceased at age 20 from heart attack  . Heart disease Mother   . Asthma Son   . Asthma Brother   . Asthma Brother   . Asthma Mother   . Ovarian cancer Mother   . Lung cancer Maternal Aunt   . Hypertension Mother   . Stroke Maternal Grandmother   . Heart disease Maternal Grandmother   . Cancer Maternal Grandfather   . Heart disease Paternal Grandmother   . Multiple sclerosis Paternal Grandmother   . Heart attack Paternal Grandfather   . Thyroid disease Brother     died at 36  . Heart attack Brother     died at 38  . COPD Brother   . Hypertension Brother   . COPD Sister     died at 13  . Sudden death Sister   . Hyperthyroidism Sister   . Hypertension Daughter   . Asthma Son    History  Substance Use Topics  . Smoking status: Never Smoker   . Smokeless tobacco: Never Used  . Alcohol Use: No  Review of Systems ROS: Statement: All systems negative except as marked or noted in the HPI; Constitutional: Negative for fever and chills. +generalized fatigue.; ; Eyes: Negative for eye pain, redness and discharge. ; ; ENMT: Negative for ear pain, hoarseness, nasal congestion, sinus pressure and sore throat. ; ; Cardiovascular: Negative for chest pain, palpitations, diaphoresis, dyspnea and +peripheral edema. ; ; Respiratory: +wheezing yesterday. Negative for cough and stridor. ; ; Gastrointestinal: Negative for nausea, vomiting, diarrhea, abdominal pain, blood in stool, hematemesis, jaundice and rectal bleeding. . ; ; Genitourinary: Negative for dysuria, flank pain and hematuria. ; ; Musculoskeletal: +LBP. Negative for neck pain. Negative for trauma.; ; Skin: Negative for pruritus, rash, abrasions, blisters, bruising and skin lesion.; ; Neuro: Negative for headache, lightheadedness and neck  stiffness. Negative for altered level of consciousness , altered mental status, extremity weakness, paresthesias, involuntary movement, seizure and syncope.      Allergies  Ampicillin; Codeine; and Sulfonamide derivatives  Home Medications   Prior to Admission medications   Medication Sig Start Date End Date Taking? Authorizing Provider  albuterol (PROVENTIL HFA;VENTOLIN HFA) 108 (90 BASE) MCG/ACT inhaler Inhale 2 puffs into the lungs every 6 (six) hours as needed for wheezing or shortness of breath.   Yes Historical Provider, MD  Cholecalciferol (VITAMIN D-3 PO) Take 2 tablets by mouth daily.   Yes Historical Provider, MD  diazepam (VALIUM) 5 MG tablet Take 5 mg by mouth 2 (two) times daily as needed for anxiety or muscle spasms.   Yes Historical Provider, MD  estradiol (ESTRADERM) 0.05 MG/24HR patch Place 1 patch onto the skin 2 (two) times a week.   Yes Historical Provider, MD  fluticasone (FLONASE) 50 MCG/ACT nasal spray Place 2 sprays into both nostrils 2 (two) times daily.   Yes Historical Provider, MD  ibuprofen (ADVIL,MOTRIN) 800 MG tablet Take 800 mg by mouth every 8 (eight) hours as needed for mild pain or moderate pain.   Yes Historical Provider, MD  levothyroxine (SYNTHROID, LEVOTHROID) 75 MCG tablet Take 75 mcg by mouth every morning.   Yes Historical Provider, MD  metoCLOPramide (REGLAN) 10 MG tablet Take 1 tablet (10 mg total) by mouth every 6 (six) hours as needed for nausea. 7/82/95  Yes Delora Fuel, MD  metoprolol (LOPRESSOR) 50 MG tablet Take 50 mg by mouth 2 (two) times daily.    Yes Historical Provider, MD  ondansetron (ZOFRAN) 4 MG tablet Take 4 mg by mouth every 8 (eight) hours as needed for nausea or vomiting.   Yes Historical Provider, MD  oxyCODONE (ROXICODONE) 15 MG immediate release tablet Take 15 mg by mouth every 6 (six) hours as needed for pain.   Yes Historical Provider, MD  pantoprazole (PROTONIX) 20 MG tablet Take 1 tablet (20 mg total) by mouth daily before  breakfast. 02/27/15  Yes [name], MD  progesterone (PROMETRIUM) 100 MG capsule Take 100 mg by mouth at bedtime.    Yes Historical Provider, MD  testosterone cypionate (DEPOTESTOTERONE CYPIONATE) 100 MG/ML injection Inject 0.05 mg into the muscle every 7 (seven) days. For IM use only   Yes Historical Provider, MD  Wheat Dextrin (BENEFIBER) POWD Take 2 tablespoons daily 03/10/15  Yes [name], MD  atorvastatin (LIPITOR) 40 MG tablet Take 1 tablet (40 mg total) by mouth daily. Patient not taking: Reported on 04/09/2015 01/26/15   Pixie Casino, MD  methocarbamol (ROBAXIN) 500 MG tablet Take 2 tablets (1,000 mg total) by mouth 4 (four) times daily as needed for muscle  spasms (muscle spasm/pain). 04/09/15   Francine Graven, DO   BP 141/86 mmHg  Pulse 94  Temp(Src) 97.9 F (36.6 C) (Oral)  Resp 20  Ht 5\' 3"  (1.6 m)  Wt 308 lb (139.708 kg)  BMI 54.57 kg/m2  SpO2 100%   Patient Vitals for the past 24 hrs:  BP Temp Temp src Pulse Resp SpO2 Height Weight  04/09/15 1209 - - - - - 98 % - -  04/09/15 1137 - - - 85 - 96 % - -  04/09/15 1134 - 98.1 F (36.7 C) Oral - - - - -  04/09/15 1131 (!) 106/44 mmHg - - 87 - 94 % - -  04/09/15 1038 134/88 mmHg 97.9 F (36.6 C) Oral 97 20 94 % 5\' 3"  (1.6 m) (!) 308 lb (139.708 kg)  04/09/15 1037 134/88 mmHg - - 105 - 94 % - -     12:23 Orthostatic Vital Signs KS  Orthostatic Lying  - BP- Lying: 100/59 mmHg ; Pulse- Lying: 95  Orthostatic Sitting - BP- Sitting: 122/95 mmHg ; Pulse- Sitting: 96  Orthostatic Standing at 0 minutes - BP- Standing at 0 minutes: 136/75 mmHg ; Pulse- Standing at 0 minutes: 103      Physical Exam  1200: Physical examination:  Nursing notes reviewed; Vital signs and O2 SAT reviewed;  Constitutional: Well developed, Well nourished, Well hydrated, In no acute distress; Head:  Normocephalic, atraumatic; Eyes: EOMI, PERRL, No scleral icterus; ENMT: Mouth and pharynx normal, Mucous membranes moist; Neck: Supple, Full range  of motion, No lymphadenopathy; Cardiovascular: Regular rate and rhythm, No murmur, rub, or gallop; Respiratory: Breath sounds clear & equal bilaterally, No rales, rhonchi, wheezes.  Speaking full sentences with ease, Normal respiratory effort/excursion; Chest: Nontender, Movement normal; Abdomen: Soft, Nontender, Nondistended, Normal bowel sounds; Genitourinary: No CVA tenderness; Spine:  No midline CS, TS, LS tenderness. +TTP left lower lumbar paraspinal muscles. No rash.;; Extremities: Pulses normal, No tenderness, No edema, No calf edema or asymmetry. No UE's edema.; Neuro: AA&Ox3, Major CN grossly intact.  Speech clear. No gross focal motor or sensory deficits in extremities. Climbs on and off stretcher easily by herself. Gait steady.; Skin: Color normal, Warm, Dry.   ED Course  Procedures      EKG Interpretation None      MDM  MDM Reviewed: previous chart, nursing note and vitals Reviewed previous: labs Interpretation: labs   Results for orders placed or performed during the hospital encounter of 04/09/15  Urinalysis, Routine w reflex microscopic-may I&O cath if menses (not at [facility])  Result Value Ref Range   Color, Urine YELLOW YELLOW   APPearance CLEAR CLEAR   Specific Gravity, Urine >1.030 (H) 1.005 - 1.030   pH 5.5 5.0 - 8.0   Glucose, UA NEGATIVE NEGATIVE mg/dL   Hgb urine dipstick NEGATIVE NEGATIVE   Bilirubin Urine NEGATIVE NEGATIVE   Ketones, ur NEGATIVE NEGATIVE mg/dL   Protein, ur 30 (A) NEGATIVE mg/dL   Urobilinogen, UA 0.2 0.0 - 1.0 mg/dL   Nitrite NEGATIVE NEGATIVE   Leukocytes, UA NEGATIVE NEGATIVE  Basic metabolic panel  Result Value Ref Range   Sodium 138 135 - 145 mmol/L   Potassium 4.0 3.5 - 5.1 mmol/L   Chloride 104 101 - 111 mmol/L   CO2 25 22 - 32 mmol/L   Glucose, Bld 96 65 - 99 mg/dL   BUN 11 6 - 20 mg/dL   Creatinine, Ser 0.71 0.44 - 1.00 mg/dL   Calcium 9.1 8.9 - 10.3  mg/dL   GFR calc non Af Amer >60 >60 mL/min   GFR calc Af Amer >60 >60  mL/min   Anion gap 9 5 - 15  CBC  Result Value Ref Range   WBC 9.4 4.0 - 10.5 K/uL   RBC 4.86 3.87 - 5.11 MIL/uL   Hemoglobin 13.7 12.0 - 15.0 g/dL   HCT 41.8 36.0 - 46.0 %   MCV 86.0 78.0 - 100.0 fL   MCH 28.2 26.0 - 34.0 pg   MCHC 32.8 30.0 - 36.0 g/dL   RDW 13.5 11.5 - 15.5 %   Platelets 248 150 - 400 K/uL  Urine microscopic-add on  Result Value Ref Range   Squamous Epithelial / LPF MANY (A) RARE   Bacteria, UA MANY (A) RARE   Urine-Other MUCOUS PRESENT   CBG monitoring, ED  Result Value Ref Range   Glucose-Capillary 98 65 - 99 mg/dL    1315:  Pt requested "a neb treatment" and was given duoneb. Pt's VS improved after IVF. Labs reassuring. No UTI on Udip (contaminated). Lungs continue CTA without wheezing, resps easy, Sats 100% R/A. Neuro exam intact and unchanged. No indication for admission at this time. Pt's usual home pain med ordered. Pt now very agitated and hollering at multiple ED staff. She has gotten herself dressed, is demanding her IV out, and wants to go home. Pt upset because she "didn't get any pain medications" and "my blood pressure was low" and "nothing was done for me" and "I'm still in pain."  ED RN and I thoroughly explained her ED testing results, as well as she was given IVF and a dose of pain medication. Also explained that there was no indication for admission at this time. Pt refused to take the pain medication because "that's what I have at home" "I can take my own," and she "wants something different here" "by IV."  Explained pain management policy, new federal opiate guidelines, and that she was receiving a dose of oral narcotic pain medication here. Pt upset regarding this. Offered again medication here, and she refused again, stating "you made me call my daughter and wake her up to get me my pain pills." ED RN and I explained that no one told her to call her daughter for her pills, and she was offered pain medication here. We offered again; she refused. Pt  requesting discharge.        Francine Graven, DO 04/11/15 2310

## 2015-04-09 NOTE — ED Notes (Signed)
Pt c/o edema in bilateral arms and legs, lower back pain, hard to urinate, wheezing, lethargic since last Thursday. No pain with urination.

## 2015-04-09 NOTE — Discharge Instructions (Signed)
°Emergency Department Resource Guide °1) Find a Doctor and Pay Out of Pocket °Although you won't have to find out who is covered by your insurance plan, it is a good idea to ask around and get recommendations. You will then need to call the office and see if the doctor you have chosen will accept you as a new patient and what types of options they offer for patients who are self-pay. Some doctors offer discounts or will set up payment plans for their patients who do not have insurance, but you will need to ask so you aren't surprised when you get to your appointment. ° °2) Contact Your Local Health Department °Not all health departments have doctors that can see patients for sick visits, but many do, so it is worth a call to see if yours does. If you don't know where your local health department is, you can check in your phone book. The CDC also has a tool to help you locate your state's health department, and many state websites also have listings of all of their local health departments. ° °3) Find a Walk-in Clinic °If your illness is not likely to be very severe or complicated, you may want to try a walk in clinic. These are popping up all over the country in pharmacies, drugstores, and shopping centers. They're usually staffed by nurse practitioners or physician assistants that have been trained to treat common illnesses and complaints. They're usually fairly quick and inexpensive. However, if you have serious medical issues or chronic medical problems, these are probably not your best option. ° °No Primary Care Doctor: °- Call [facility] at  832-8000 - they can help you locate a primary care doctor that  accepts your insurance, provides certain services, etc. °- Physician Referral Service- 1-800-533-3463 ° °Chronic Pain Problems: °Organization         Address  Phone   Notes  °[facility]  (336) 297-2271 Patients need to be referred by their primary care doctor.  ° °Medication  Assistance: °Organization         Address  Phone   Notes  °[place] [facility] [address] °[place], [place] [address] (336) 641-8030 --Must be a resident of [place] °-- Must have NO insurance coverage whatsoever (no Medicaid/ Medicare, etc.) °-- The pt. MUST have a primary care doctor that directs their care regularly and follows them in the community °  °[facility]  (866) 331-1348   °[employer]  (888) 892-1162   ° °Agencies that provide inexpensive medical care: °Organization         Address  Phone   Notes  °[facility]  (336) 832-8035   °[facility]    (336) 832-7272   °[facility] [address] °[place], [place] [address] (336) 832-4777   °[facility] [address], °[place] (336) 271-4999   °[facility]    (336) 373-0678   °[facility]    (336) 272-1050   °[facility] ° [address], [place] Phone:  (336) 832-4444, Fax:  (336) 832-4440 Hours of Operation:  9 am - 6 pm, M-F.  Also accepts Medicaid/Medicare and self-pay.  °[facility] ° [address], [place] Phone: (336) 832-3150, Fax: (336) 832-3151. Hours of Operation:  8:30 am - 5:30 pm, M-F.  Also accepts Medicaid and self-pay.  °[facility] [place] [address], [place] Phone: (336) 878-6027   °[facility] [address], [place], [place] (336)723-1848, Ext. 123 Mondays & Thursdays: 7-9 AM.  First 15 patients are seen on a first come, first serve basis. °  ° °Medicaid-accepting [place] Providers: ° °Organization         Address  Phone   Notes  °[facility] [address], [place] (336) 641-2100 Also accepts self-pay patients.  °[facility] [address], [place] ° (336) 856-9996   °[facility] [address], [place]  (336) 288-8857   °[facility] [address] [place] [address], [place] (336) 299-7000   °Veita Bland [address], [place]  ° (336) 373-1557 Only accepts [place] Access Medicaid patients after they have their name applied to their card.  ° °Self-Pay (no insurance) in [place]: ° °Organization         Address  Phone   Notes  °Sickle Cell Patients, [facility] [address], [place] (336) 832-1970   °[facility] [address], [place] (336) 832-4400   °[facility] ° [address], [place] (336) 992-4800   °[facility]/Dr. Osei-Bonsu ° [address] [place] [address], [place] or [address], [place] (336) 841-8500 Phone number for both [place] and [place] locations is the same.  °[facility] [address], [place] (336) 299-0000   °[facility] [address] [place] [address], [place] or [address] (336) 852-7530 °(336) 878-2260   °[facility] [address], [place] (336) 350-1642, phone; (336) 294-5005, fax Sees patients 1st and 3rd Saturday of every month.  Must not qualify for public or private insurance (i.e. Medicaid, Medicare, [place] Health Choice, Veterans' Benefits) • Household income should be no more than 200% of the poverty level •The clinic cannot treat you if you are pregnant or think you are pregnant • Sexually transmitted diseases are not treated at the clinic.  ° ° °Dental Care: °Organization         Address  Phone  Notes  °[place] [facility] [address],  (336) 641-6152 Accepts children up to age 21 who are enrolled in Medicaid or [place] Health Choice; pregnant women with a Medicaid card; and children who have applied for Medicaid or [place] Health Choice, but were declined, whose parents can pay a reduced fee at time of service.  °[place]  [facility] [place]  [address], [place] (336) 641-7733 Accepts children up to age 21 who are enrolled in Medicaid or [place] Health Choice; pregnant women with a Medicaid card; and children who have applied for Medicaid or  Health Choice, but were declined, whose parents can pay a reduced fee at time of service.  °[facility] ° [address],  (336) 641-4533 Patients are seen by appointment only. Walk-ins are not accepted. [facility] will see patients 18 years of age and older. °Monday - Tuesday (8am-5pm) °Most Wednesdays (8:30-5pm) °$30 per visit, cash only  °[facility] ° [address], [place] (336) 641-4533 Patients are seen by appointment only. Walk-ins are not accepted. [facility] will see patients 18 years of age and older. °One   Wednesday Evening (Monthly: Volunteer Based).  $30 per visit, cash only  °[facility]  (919) 537-3737 for adults; Children under age 4, call Graduate Pediatric Dentistry at ([phone]) [phone]. Children aged 4-14, please call (919) [phone] to request a pediatric application. ° Dental services are provided in all areas of dental care including fillings, crowns and bridges, complete and partial dentures, implants, gum treatment, root canals, and extractions. Preventive care is also provided. Treatment is provided to both adults and children. °Patients are selected via a lottery and there is often a waiting list. °  °[facility] [address], °[place] ° (336) 763-8833 [email or web address] °  °[facility] [address], [place], [place] (336)723-1848, Ext. 123 Second and Fourth Thursday of each month, opens at 6:30 AM; Clinic ends at 9 AM.  Patients are seen on a first-come first-served basis, and a limited number are seen during each clinic.  ° °[facility] ° [address], [place], [place] (336) 723-7904    Eligibility Requirements °You must have lived in [place], or [place] counties for at least the last three months. °  You cannot be eligible for state or federal sponsored healthcare insurance, including Veterans Administration, Medicaid, or Medicare. °  You generally cannot be eligible for healthcare insurance through your employer.  °  How to apply: °Eligibility screenings are held every Tuesday and Wednesday afternoon from 1:00 pm until 4:00 pm. You do not need an appointment for the interview!  °[facility] [address], [place], [place] 336-631-2330   °[facility]  336-342-8273   °[facility]  336-703-3100   ° [facility]  336-570-6415   ° °Behavioral Health Resources in the Community: °Intensive Outpatient Programs °Organization         Address  Phone  Notes  °[facility] [address], [place], [place] 336-878-6098   °[facility] [address], [place], [place] 336-832-9800   °[facility] [address], [place], [place] ° 336-882-2125   °[facility] [address],  °[place], [place] 1-800-853-5163 or 336-641-4981   °Substance Abuse Resources °Organization         Address  Phone  Notes  °[facility]  336-882-2125   °[facility]  336-784-9470   °[facility]  336-285-9073   °[facility]  336-845-3988   °Residential & Outpatient Substance Abuse Program  1-800-659-3381   °Psychological Services °Organization         Address  Phone  Notes  °[facility]  336- 832-9600   °[facility]  336- 378-7881   °[facility] [address], [place] 1-800-853-5163 or 336-641-4981   ° °Mobile Crisis Teams °Organization         Address  Phone  Notes  °[facility], Mobile Crisis Care Unit  1-877-626-1772   °Assertive °[facility] ° [address]  [place], [place] 336-834-9664   °Sharon DeEsch [address] °[place] 336-554-5454   ° °Self-Help/Support Groups °Organization         Address  Phone             Notes  °[facility] [place] - variety of support groups  336- 373-1402 Call for more information  °Narcotics Anonymous (NA), [facility] [address], °[place]   2 meetings at this location  ° °  Residential Treatment Programs Organization         Address  Phone  Notes  [facility] [address]    [place]  1-571-281-1888   [facility]  [address], [place] [address], [place], [place]   [place], [place] (760)293-9327 Admissions: 8am-3pm M-F  [facility] [address]    [place], [place] 902-111-5520   [facility] [address] [place], [place], [place]   [facility] [address]  [place], [place]   [facility] Intensive Outpatient [address], [place], [place]   [facility] [address]  [place], [place] 1-425-522-0811 or (548) 885-8895   [facility] [address], [place], [place] Accepts Medicaid  [facility] [address],  [place] 1-(936) [phone] Substance Abuse/Addiction Treatment   [facility]         Address  Phone  Notes  [facility]  814-237-0579   Domenic Schwab, PhD [address] [place] [place], [place]   970 334 9154 or 210-866-1376   [facility] [place], [place] [phone]   [facility] [address], [place], [place] 813-882-8430 Insurance/Medicaid/sponsorship through [facility] [address]                                    [place], [place] ([phone]) [phone] [place] [address], [place] [phone]    Dr. Adele Schilder  [phone]   [facility] Dept. 1) [address] [place] 2) [place] 3)  [address], [place] (267)[phone]  ([phone]   [place] 778-874-1931 or [phone] (After Hours)      Take the prescription as directed.  Apply moist heat or ice to the area(s) of discomfort, for 15 minutes at a time, several times per day for the next few days.  Do not fall asleep on a heating or ice pack.  Increase your fluid intake (ie:  Gatoraide) for the next few days. Call your regular medical doctor tomorrow to schedule a follow up appointment within the next 2 days.  Return to the Emergency Department immediately sooner if worsening.

## 2015-04-09 NOTE — ED Notes (Signed)
Pt c/o not feeling right.  Wants her sugar checked.   CBG 98.  Rechecking vital signs.  Pt no visible distress.

## 2015-04-09 NOTE — ED Notes (Signed)
Pt asking for pain medicine for back.  Dr. Thurnell Garbe notified.

## 2015-04-10 DIAGNOSIS — E119 Type 2 diabetes mellitus without complications: Secondary | ICD-10-CM | POA: Diagnosis not present

## 2015-04-10 DIAGNOSIS — M545 Low back pain: Secondary | ICD-10-CM | POA: Diagnosis not present

## 2015-04-10 DIAGNOSIS — N959 Unspecified menopausal and perimenopausal disorder: Secondary | ICD-10-CM | POA: Diagnosis not present

## 2015-04-10 DIAGNOSIS — I10 Essential (primary) hypertension: Secondary | ICD-10-CM | POA: Diagnosis not present

## 2015-04-11 ENCOUNTER — Ambulatory Visit (INDEPENDENT_AMBULATORY_CARE_PROVIDER_SITE_OTHER): Payer: Medicare Other | Admitting: Pulmonary Disease

## 2015-04-11 ENCOUNTER — Encounter: Payer: Self-pay | Admitting: Pulmonary Disease

## 2015-04-11 VITALS — BP 98/70 | HR 104 | Ht 63.0 in | Wt 313.2 lb

## 2015-04-11 DIAGNOSIS — J4520 Mild intermittent asthma, uncomplicated: Secondary | ICD-10-CM | POA: Diagnosis not present

## 2015-04-11 DIAGNOSIS — G4733 Obstructive sleep apnea (adult) (pediatric): Secondary | ICD-10-CM | POA: Diagnosis not present

## 2015-04-11 NOTE — Progress Notes (Signed)
   Subjective:    Patient ID: Kristina [name], female    DOB: [birth year], 44 y.o.   MRN: [id number]  HPI 43/F for followup of her obstructive sleep apnea. She was placed on auto CPAP for a few weeks, and found to have an optimal pressure of 14 cm. she underwent mask desensitization visits at the sleep Center at continues to struggle with mask and pressure issues   04/11/2015  Chief Complaint  Patient presents with  . Sleep Apnea    former [facility] pt.  patient wants to have new sleep study.  Pt having hard time with current machine, mask does not fit, very old machine that is uncomfortable.  Dr. Debara Pickett told her that her heart stops at night during her sleep.      Annual FU Seen by cards for palpitations - 2s pause noted at 2 am -attributed to OSA Lopressor stopped & changed to lisinopril 10 mg - PCP Tachy today and low blood pressure today Wt as low as 275 lbs & gained 40 lbs now  Significant tests/ events  NPSG 07/2011:  AHI 13/hr, cpap titrated to 8cm on split Auto titration 11/2013:  Optimal pressure 14cm.   PFTs 2012 nml  Review of Systems neg for any significant sore throat, dysphagia, itching, sneezing, nasal congestion or excess/ purulent secretions, fever, chills, sweats, unintended wt loss, pleuritic or exertional cp, hempoptysis, orthopnea pnd or change in chronic leg swelling. Also denies presyncope, palpitations, heartburn, abdominal pain, nausea, vomiting, diarrhea or change in bowel or urinary habits, dysuria,hematuria, rash, arthralgias, visual complaints, headache, numbness weakness or ataxia.     Objective:   Physical Exam  Gen. Pleasant, obese, in no distress ENT - no lesions, no post nasal drip Neck: No JVD, no thyromegaly, no carotid bruits Lungs: no use of accessory muscles, no dullness to percussion, decreased without rales or rhonchi  Cardiovascular: Rhythm regular, heart sounds  normal, no murmurs or gallops, no peripheral edema Musculoskeletal: No  deformities, no cyanosis or clubbing , no tremors       Assessment & Plan:

## 2015-04-11 NOTE — Assessment & Plan Note (Signed)
Doubt true asthma- use albuterol when necessary Obesity seems to be the main issue here

## 2015-04-11 NOTE — Assessment & Plan Note (Signed)
Will change to autoCPAP settings - hopefully this will help with compliance CPAP titration study, due to wt gain , will also help with mask desensitization- we will make changes based on this Continue weight loss attempts  Weight loss encouraged, compliance with goal of at least 4-6 hrs every night is the expectation. Advised against medications with sedative side effects Cautioned against driving when sleepy - understanding that sleepiness will vary on a day to day basis

## 2015-04-11 NOTE — Patient Instructions (Signed)
Will change to autoCPAP settings CPAP titration study - we will make changes based on this Continue weight loss attempts

## 2015-04-19 DIAGNOSIS — I10 Essential (primary) hypertension: Secondary | ICD-10-CM | POA: Diagnosis not present

## 2015-04-19 DIAGNOSIS — E119 Type 2 diabetes mellitus without complications: Secondary | ICD-10-CM | POA: Diagnosis not present

## 2015-05-03 ENCOUNTER — Other Ambulatory Visit (HOSPITAL_COMMUNITY): Payer: Self-pay | Admitting: Internal Medicine

## 2015-05-03 ENCOUNTER — Ambulatory Visit (HOSPITAL_COMMUNITY)
Admission: RE | Admit: 2015-05-03 | Discharge: 2015-05-03 | Disposition: A | Discharge status: Home / Self Care | Payer: Medicare Other | Source: Ambulatory Visit | Attending: Internal Medicine | Admitting: Internal Medicine

## 2015-05-03 DIAGNOSIS — M545 Low back pain: Secondary | ICD-10-CM | POA: Diagnosis not present

## 2015-05-03 DIAGNOSIS — M25559 Pain in unspecified hip: Secondary | ICD-10-CM | POA: Insufficient documentation

## 2015-05-03 DIAGNOSIS — R52 Pain, unspecified: Secondary | ICD-10-CM

## 2015-05-30 DIAGNOSIS — M5442 Lumbago with sciatica, left side: Secondary | ICD-10-CM | POA: Diagnosis not present

## 2015-05-30 DIAGNOSIS — M5136 Other intervertebral disc degeneration, lumbar region: Secondary | ICD-10-CM | POA: Diagnosis not present

## 2015-05-31 DIAGNOSIS — E039 Hypothyroidism, unspecified: Secondary | ICD-10-CM | POA: Diagnosis not present

## 2015-05-31 DIAGNOSIS — I10 Essential (primary) hypertension: Secondary | ICD-10-CM | POA: Diagnosis not present

## 2015-05-31 DIAGNOSIS — J019 Acute sinusitis, unspecified: Secondary | ICD-10-CM | POA: Diagnosis not present

## 2015-06-06 DIAGNOSIS — M545 Low back pain: Secondary | ICD-10-CM | POA: Diagnosis not present

## 2015-06-06 DIAGNOSIS — G8929 Other chronic pain: Secondary | ICD-10-CM | POA: Diagnosis not present

## 2015-06-14 ENCOUNTER — Encounter: Payer: Self-pay | Admitting: Internal Medicine

## 2015-06-14 ENCOUNTER — Telehealth: Payer: Self-pay | Admitting: Internal Medicine

## 2015-06-14 NOTE — Telephone Encounter (Signed)
Dr. Carlean Purl are you willing to write her a letter recommending her for weight loss surgery?

## 2015-06-15 ENCOUNTER — Other Ambulatory Visit: Payer: Self-pay | Admitting: General Surgery

## 2015-06-15 DIAGNOSIS — E119 Type 2 diabetes mellitus without complications: Secondary | ICD-10-CM | POA: Diagnosis not present

## 2015-06-15 DIAGNOSIS — E669 Obesity, unspecified: Secondary | ICD-10-CM | POA: Diagnosis not present

## 2015-06-15 DIAGNOSIS — M5489 Other dorsalgia: Secondary | ICD-10-CM | POA: Diagnosis not present

## 2015-06-15 DIAGNOSIS — Z6841 Body Mass Index (BMI) 40.0 and over, adult: Secondary | ICD-10-CM | POA: Diagnosis not present

## 2015-06-15 DIAGNOSIS — G4733 Obstructive sleep apnea (adult) (pediatric): Secondary | ICD-10-CM | POA: Diagnosis not present

## 2015-06-15 DIAGNOSIS — K219 Gastro-esophageal reflux disease without esophagitis: Secondary | ICD-10-CM | POA: Diagnosis not present

## 2015-06-15 DIAGNOSIS — I10 Essential (primary) hypertension: Secondary | ICD-10-CM | POA: Diagnosis not present

## 2015-06-15 DIAGNOSIS — J45909 Unspecified asthma, uncomplicated: Secondary | ICD-10-CM | POA: Diagnosis not present

## 2015-06-15 DIAGNOSIS — M545 Low back pain: Secondary | ICD-10-CM | POA: Diagnosis not present

## 2015-06-19 NOTE — Telephone Encounter (Signed)
done

## 2015-06-19 NOTE — Telephone Encounter (Signed)
Left message for patient to call back  

## 2015-06-20 NOTE — Telephone Encounter (Signed)
Letter mailed to the patient's home address

## 2015-06-23 DIAGNOSIS — M5442 Lumbago with sciatica, left side: Secondary | ICD-10-CM | POA: Diagnosis not present

## 2015-06-23 DIAGNOSIS — M5136 Other intervertebral disc degeneration, lumbar region: Secondary | ICD-10-CM | POA: Diagnosis not present

## 2015-06-27 ENCOUNTER — Telehealth: Payer: Self-pay | Admitting: Internal Medicine

## 2015-06-27 NOTE — Telephone Encounter (Signed)
Received records from [facility] for appointment on 07/13/15 with Dr Debara Pickett.  Records given to [facility] (medical records) for Dr [facility] schedule on 07/13/15. lp

## 2015-06-28 ENCOUNTER — Encounter (HOSPITAL_BASED_OUTPATIENT_CLINIC_OR_DEPARTMENT_OTHER): Payer: Medicare Other

## 2015-06-28 DIAGNOSIS — M5136 Other intervertebral disc degeneration, lumbar region: Secondary | ICD-10-CM | POA: Diagnosis not present

## 2015-06-28 DIAGNOSIS — M5442 Lumbago with sciatica, left side: Secondary | ICD-10-CM | POA: Diagnosis not present

## 2015-06-28 DIAGNOSIS — G8929 Other chronic pain: Secondary | ICD-10-CM | POA: Diagnosis not present

## 2015-07-05 ENCOUNTER — Encounter: Payer: Self-pay | Admitting: Internal Medicine

## 2015-07-05 DIAGNOSIS — Z23 Encounter for immunization: Secondary | ICD-10-CM | POA: Diagnosis not present

## 2015-07-05 DIAGNOSIS — Z124 Encounter for screening for malignant neoplasm of cervix: Secondary | ICD-10-CM | POA: Diagnosis not present

## 2015-07-05 DIAGNOSIS — R87612 Low grade squamous intraepithelial lesion on cytologic smear of cervix (LGSIL): Secondary | ICD-10-CM | POA: Diagnosis not present

## 2015-07-05 DIAGNOSIS — I10 Essential (primary) hypertension: Secondary | ICD-10-CM | POA: Diagnosis not present

## 2015-07-05 DIAGNOSIS — E559 Vitamin D deficiency, unspecified: Secondary | ICD-10-CM | POA: Diagnosis not present

## 2015-07-05 DIAGNOSIS — Z1231 Encounter for screening mammogram for malignant neoplasm of breast: Secondary | ICD-10-CM | POA: Diagnosis not present

## 2015-07-05 DIAGNOSIS — E119 Type 2 diabetes mellitus without complications: Secondary | ICD-10-CM | POA: Diagnosis not present

## 2015-07-05 DIAGNOSIS — N959 Unspecified menopausal and perimenopausal disorder: Secondary | ICD-10-CM | POA: Diagnosis not present

## 2015-07-05 DIAGNOSIS — E039 Hypothyroidism, unspecified: Secondary | ICD-10-CM | POA: Diagnosis not present

## 2015-07-05 DIAGNOSIS — J45909 Unspecified asthma, uncomplicated: Secondary | ICD-10-CM | POA: Diagnosis not present

## 2015-07-13 ENCOUNTER — Encounter: Payer: Self-pay | Admitting: Internal Medicine

## 2015-07-13 ENCOUNTER — Ambulatory Visit (INDEPENDENT_AMBULATORY_CARE_PROVIDER_SITE_OTHER): Payer: Medicare Other | Admitting: Internal Medicine

## 2015-07-13 VITALS — BP 128/74 | HR 85 | Ht 63.0 in | Wt 315.0 lb

## 2015-07-13 DIAGNOSIS — Z01810 Encounter for preprocedural cardiovascular examination: Secondary | ICD-10-CM

## 2015-07-13 DIAGNOSIS — G4733 Obstructive sleep apnea (adult) (pediatric): Secondary | ICD-10-CM | POA: Diagnosis not present

## 2015-07-13 DIAGNOSIS — R0602 Shortness of breath: Secondary | ICD-10-CM | POA: Diagnosis not present

## 2015-07-13 DIAGNOSIS — R002 Palpitations: Secondary | ICD-10-CM

## 2015-07-13 NOTE — Patient Instructions (Signed)
Your physician recommends that you schedule a follow-up appointment as needed  

## 2015-07-13 NOTE — Progress Notes (Signed)
OFFICE NOTE  Chief Complaint:  Preoperative cardiovascular examination  Primary Care Physician: Doree Albee, MD  HPI:  Kristina [name] is a 44 year old female who is seen today for chest pain, tachycardia, palpitations and worsening shortness of breath. Her past history is significant for morbid obesity, numerous GI problems including cholecystectomy and reflux, hypothyroidism, abnormal hormone levels on both estrogen, progesterone and testosterone therapy anxiety and depression and a very strong family history of coronary disease. Notes indicated that she previously saw Dr. Lattie Haw with lobar cardiology, however no prior coronary artery diagnosis is made. Family history significant for heart disease in both parents relatives brothers and sisters. All of this is at a younger age. Recently she's had 30-40 pound weight gain and progressive shortness of breath and chest pain. The pain can come on mostly at rest but can be associated with exertion. It's sharp and seems to go across the mid chest. Also noted tachycardia at rest and shortness of breath when walking up hills which limits her activities.  I the pleasure seeing Kristina [name] back in the office today. She underwent monitoring which failed to show any tachyarrhythmias or any significant findings other than one 2 second pause, which seem to happen at 2 AM during the monitoring. She recalls of the night and that positive episode she forgot to put on her CPAP and I suspect she was apneic for a period of time. With regards to her stress test this was negative for ischemia with preserved EF. This is reassuring and she reports her chest discomfort has improved somewhat but not totally gone away. Her palpitations are also somewhat better.  I saw Kristina [name] back in the office today for preoperative vascular examination for weight loss surgery. She is being evaluated for a laparoscopic sleeve gastrectomy by Dr. Redmond Pulling. She's been struggling  with weight issues and been in the [facility] for over a year and a half. She's also had difficulty in managing her sleep apnea and particular keeping her mask on at night. When I last saw her she was having chest discomfort which has resolved. She was also having palpitations that have improved. She recently had a negative nuclear stress test this year. She denies any angina and has stable shortness of breath without any worsening symptoms. There are no concerning heart murmurs.  PMHx:  Past Medical History  Diagnosis Date  . Hypertension   . Asthma   . Restless leg   . High cholesterol   . Bulging disc   . Hypothyroidism   . Obstructive sleep apnea     uses c-pap at night  . Obesity   . Vitamin D deficiency   . PONV (postoperative nausea and vomiting)   . Diabetes mellitus     diet control only  . Lumbosacral spondylosis without myelopathy     history bulging disc-chronic pain(level 3 to 8).  . Depression   . Anxiety   . Hx of adenomatous and sessile serrated colonic polyps   . Diverticulosis   . Normal cardiac stress test 02/2015    with normal EF  . Palpitations   . Chronic cough   . Allergic rhinitis   . Dyspnea     chronic  . Chronic back pain   . Vocal cord dysfunction     Past Surgical History  Procedure Laterality Date  . Abdominal hysterectomy  1996  . Cholecystectomy  1994  . Nose surgery  2004  . Esophagogastroduodenoscopy N/A 10/27/2013  Procedure: ESOPHAGOGASTRODUODENOSCOPY (EGD);  Surgeon: [name], MD;  Location: [facility];  Service: Endoscopy;  Laterality: N/A;  . Knee arthroscopy Right   . Colonoscopy with propofol N/A 03/10/2015    Procedure: COLONOSCOPY WITH PROPOFOL;  Surgeon: [name], MD;  Location: [facility];  Service: Endoscopy;  Laterality: N/A;    FAMHx:  Family History  Problem Relation Age of Onset  . Emphysema Mother   . Allergies Mother   . Allergies Child   . Heart disease Father      deceased at age 7 from heart attack  . Heart disease Mother   . Asthma Son   . Asthma Brother   . Asthma Brother   . Asthma Mother   . Ovarian cancer Mother   . Lung cancer Maternal Aunt   . Hypertension Mother   . Stroke Maternal Grandmother   . Heart disease Maternal Grandmother   . Cancer Maternal Grandfather   . Heart disease Paternal Grandmother   . Multiple sclerosis Paternal Grandmother   . Heart attack Paternal Grandfather   . Thyroid disease Brother     died at 66  . Heart attack Brother     died at 28  . COPD Brother   . Hypertension Brother   . COPD Sister     died at 45  . Sudden death Sister   . Hyperthyroidism Sister   . Hypertension Daughter   . Asthma Son     SOCHx:   reports that she has never smoked. She has never used smokeless tobacco. She reports that she does not drink alcohol or use illicit drugs.  ALLERGIES:  Allergies  Allergen Reactions  . Ampicillin Anaphylaxis  . Codeine Hives and Shortness Of Breath  . Sulfonamide Derivatives Rash    ROS: A comprehensive review of systems was negative.  HOME MEDS: Current Outpatient Prescriptions  Medication Sig Dispense Refill  . albuterol (PROVENTIL HFA;VENTOLIN HFA) 108 (90 BASE) MCG/ACT inhaler Inhale 2 puffs into the lungs every 6 (six) hours as needed for wheezing or shortness of breath.    . Cholecalciferol (VITAMIN D-3 PO) Take 2 tablets by mouth daily.    . diazepam (VALIUM) 5 MG tablet Take 5 mg by mouth 2 (two) times daily as needed for anxiety or muscle spasms.    [name] estradiol (ESTRADERM) 0.05 MG/24HR patch Place 1 patch onto the skin 2 (two) times a week.    . fluticasone (FLONASE) 50 MCG/ACT nasal spray Place 2 sprays into both nostrils 2 (two) times daily.    [name] ibuprofen (ADVIL,MOTRIN) 800 MG tablet Take 800 mg by mouth every 8 (eight) hours as needed for mild pain or moderate pain.    [name] levothyroxine (SYNTHROID, LEVOTHROID) 75 MCG tablet Take 75 mcg by mouth every morning.    .  metoCLOPramide (REGLAN) 10 MG tablet Take 1 tablet (10 mg total) by mouth every 6 (six) hours as needed for nausea. 30 tablet 0  . ondansetron (ZOFRAN) 4 MG tablet Take 4 mg by mouth every 8 (eight) hours as needed for nausea or vomiting.    [name] oxyCODONE (ROXICODONE) 15 MG immediate release tablet Take 15 mg by mouth every 6 (six) hours as needed for pain.    . progesterone (PROMETRIUM) 100 MG capsule Take 100 mg by mouth at bedtime.     [name] testosterone cypionate (DEPOTESTOTERONE CYPIONATE) 100 MG/ML injection Inject 0.05 mg into the muscle every 7 (seven) days. For IM use only    . Wheat Dextrin (  BENEFIBER) POWD Take 2 tablespoons daily  0   No current facility-administered medications for this visit.    LABS/IMAGING: No results found for this or any previous visit (from the past 48 hour(s)). No results found.  WEIGHTS: Wt Readings from Last 3 Encounters:  07/13/15 315 lb (142.883 kg)  04/11/15 313 lb 3.2 oz (142.067 kg)  04/09/15 308 lb (139.708 kg)    VITALS: BP 128/74 mmHg  Pulse 85  Ht 5\' 3"  (1.6 m)  Wt 315 lb (142.883 kg)  BMI 55.81 kg/m2  EXAM: General appearance: alert and no distress Neck: no carotid bruit and no JVD Lungs: clear to auscultation bilaterally Heart: regular rate and rhythm, S1, S2 normal, no murmur, click, rub or gallop Abdomen: soft, non-tender; bowel sounds normal; no masses,  no organomegaly and Morbidly obese Extremities: extremities normal, atraumatic, no cyanosis or edema Pulses: 2+ and symmetric Skin: Skin color, texture, turgor normal. No rashes or lesions Neurologic: Grossly normal Psych: Pleasant  EKG: Sinus rhythm with first-degree AV block at 85  ASSESSMENT: 1. Low risk for bariatric surgery 2. Chest pain resolved - negative nuclear stress test 3. Sinus tachycardia/palpitations 4. Progressive shortness of breath 5. Morbid obesity 6. Anxiety   PLAN: 1.   Kristina [name] is at low risk for bariatric surgery. She had a negative nuclear  stress test and has had no further palpitations or chest pain. She seems to be using her CPAP but is having difficulty with keeping the mask on. Although she's made multiple attempts to lose weight and has actually lost 30 or 40 pounds she continues to gain it back and is at serious risk for long-term health issues. I agree the bariatric surgery could provide her a significant life benefit.  I'm happy to see her back on an as-needed basis. Thanks for the referral.  Pixie Casino, MD, [facility] Attending Cardiologist [place] 07/13/2015, 8:48 AM

## 2015-07-14 ENCOUNTER — Ambulatory Visit: Payer: Medicare Other | Admitting: Adult Health

## 2015-07-18 DIAGNOSIS — M5136 Other intervertebral disc degeneration, lumbar region: Secondary | ICD-10-CM | POA: Diagnosis not present

## 2015-07-20 DIAGNOSIS — G8929 Other chronic pain: Secondary | ICD-10-CM | POA: Diagnosis not present

## 2015-07-20 DIAGNOSIS — M5442 Lumbago with sciatica, left side: Secondary | ICD-10-CM | POA: Diagnosis not present

## 2015-07-20 DIAGNOSIS — M5136 Other intervertebral disc degeneration, lumbar region: Secondary | ICD-10-CM | POA: Diagnosis not present

## 2015-07-20 DIAGNOSIS — G971 Other reaction to spinal and lumbar puncture: Secondary | ICD-10-CM | POA: Diagnosis not present

## 2015-07-21 ENCOUNTER — Ambulatory Visit
Admission: RE | Admit: 2015-07-21 | Discharge: 2015-07-21 | Disposition: A | Discharge status: Home / Self Care | Payer: Medicare Other | Source: Ambulatory Visit | Attending: Physician Assistant | Admitting: Physician Assistant

## 2015-07-21 ENCOUNTER — Other Ambulatory Visit: Payer: Self-pay | Admitting: Physician Assistant

## 2015-07-21 DIAGNOSIS — G971 Other reaction to spinal and lumbar puncture: Secondary | ICD-10-CM

## 2015-07-21 MED ORDER — IOHEXOL 180 MG/ML  SOLN
1.0000 mL | Freq: Once | INTRAMUSCULAR | Status: DC | PRN
  Administered 2015-07-21: 1 mL via EPIDURAL

## 2015-07-21 NOTE — Discharge Instructions (Signed)

## 2015-07-21 NOTE — Progress Notes (Signed)
20cc blood drawn from left AC space for Epidural Blood Patch on second attempt; site unremarkable.  First attempt right AC space; site unremarkable.  By Cristal Ford, RN

## 2015-07-22 ENCOUNTER — Encounter: Payer: Self-pay | Admitting: Dietician

## 2015-07-22 ENCOUNTER — Encounter: Payer: Medicare Other | Attending: General Surgery | Admitting: Dietician

## 2015-07-22 DIAGNOSIS — Z6841 Body Mass Index (BMI) 40.0 and over, adult: Secondary | ICD-10-CM | POA: Insufficient documentation

## 2015-07-22 DIAGNOSIS — Z713 Dietary counseling and surveillance: Secondary | ICD-10-CM | POA: Diagnosis not present

## 2015-07-22 NOTE — Progress Notes (Signed)
  Pre-Op Assessment Visit:  Pre-Operative Sleeve Gastrectomy Surgery  Medical Nutrition Therapy:  Appt start time: 0165   End time:  100.  Patient was seen on 07/22/2015 for Pre-Operative Nutrition Assessment. Assessment and letter of approval faxed to [facility] Bariatric Surgery Program coordinator on 07/22/2015.   Preferred Learning Style:   No preference indicated   Learning Readiness:   Ready  Handouts given during visit include:  Pre-Op Goals Bariatric Surgery Protein Shakes   During the appointment today the following Pre-Op Goals were reviewed with the patient: Maintain or lose weight as instructed by your surgeon Make healthy food choices Begin to limit portion sizes Limited concentrated sugars and fried foods Keep fat/sugar in the single digits per serving on   food labels Practice CHEWING your food  (aim for 30 chews per bite or until applesauce consistency) Practice not drinking 15 minutes before, during, and 30 minutes after each meal/snack Avoid all carbonated beverages  Avoid/limit caffeinated beverages  Avoid all sugar-sweetened beverages Consume 3 meals per day; eat every 3-5 hours Make a list of non-food related activities Aim for 64-100 ounces of FLUID daily  Aim for at least 60-80 grams of PROTEIN daily Look for a liquid protein source that contain ?15 g protein and ?5 g carbohydrate  (ex: shakes, drinks, shots)  Patient-Centered Goals: -Walking without being winded -Keeping up with grandchildren -Tying shoes comfortably -Comfortable on plane rides  Scale of 1-10: confidence(8)/importance (10)  Demonstrated degree of understanding via:  Teach Back  Teaching Method Utilized:  Visual Auditory Hands on  Barriers to learning/adherence to lifestyle change: none  Patient to call the [facility] to enroll in Pre-Op and Post-Op Nutrition Education when surgery date is scheduled.

## 2015-07-26 DIAGNOSIS — N959 Unspecified menopausal and perimenopausal disorder: Secondary | ICD-10-CM | POA: Diagnosis not present

## 2015-08-01 ENCOUNTER — Ambulatory Visit (HOSPITAL_COMMUNITY)
Admission: RE | Admit: 2015-08-01 | Discharge: 2015-08-01 | Disposition: A | Discharge status: Home / Self Care | Payer: Medicare Other | Source: Ambulatory Visit | Attending: General Surgery | Admitting: General Surgery

## 2015-08-01 ENCOUNTER — Other Ambulatory Visit: Payer: Self-pay

## 2015-08-01 ENCOUNTER — Other Ambulatory Visit (HOSPITAL_COMMUNITY): Payer: Self-pay | Admitting: General Surgery

## 2015-08-01 ENCOUNTER — Inpatient Hospital Stay (HOSPITAL_COMMUNITY)
Admission: RE | Admit: 2015-08-01 | Discharge: 2015-08-01 | Disposition: A | Discharge status: Home / Self Care | Payer: Medicare Other | Source: Ambulatory Visit

## 2015-08-01 ENCOUNTER — Other Ambulatory Visit (HOSPITAL_COMMUNITY): Payer: Self-pay | Admitting: *Deleted

## 2015-08-01 DIAGNOSIS — Z01818 Encounter for other preprocedural examination: Secondary | ICD-10-CM | POA: Insufficient documentation

## 2015-08-01 DIAGNOSIS — I10 Essential (primary) hypertension: Secondary | ICD-10-CM | POA: Insufficient documentation

## 2015-08-07 ENCOUNTER — Telehealth: Payer: Self-pay | Admitting: Pulmonary Disease

## 2015-08-07 DIAGNOSIS — G4733 Obstructive sleep apnea (adult) (pediatric): Secondary | ICD-10-CM

## 2015-08-07 NOTE — Telephone Encounter (Signed)
Compliance report - 8/17 - 11/14  Per Dr. Elsworth Soho: Not using CPAP? Why?

## 2015-08-08 ENCOUNTER — Ambulatory Visit (INDEPENDENT_AMBULATORY_CARE_PROVIDER_SITE_OTHER): Payer: Medicare Other | Admitting: Psychiatry

## 2015-08-08 NOTE — Telephone Encounter (Signed)
OK to refer to dr Ron Parker Work on wt loss

## 2015-08-08 NOTE — Telephone Encounter (Signed)
Spoke with pt. She that she is not using the CPAP machine. States that she developed a sinus infection a month ago and has not been able to tolerate the CPAP. Wants to try to get an oral appliance.

## 2015-08-08 NOTE — Telephone Encounter (Signed)
[id number] x1 

## 2015-08-08 NOTE — Telephone Encounter (Signed)
[phone] pt calling back

## 2015-08-08 NOTE — Telephone Encounter (Signed)
Referral entered. Patient notified of Dr. Bari Mantis recommendations. Nothing further needed. Closing encounter

## 2015-08-17 DIAGNOSIS — J45909 Unspecified asthma, uncomplicated: Secondary | ICD-10-CM | POA: Diagnosis not present

## 2015-08-17 DIAGNOSIS — E559 Vitamin D deficiency, unspecified: Secondary | ICD-10-CM | POA: Diagnosis not present

## 2015-08-17 DIAGNOSIS — N959 Unspecified menopausal and perimenopausal disorder: Secondary | ICD-10-CM | POA: Diagnosis not present

## 2015-08-22 ENCOUNTER — Ambulatory Visit (INDEPENDENT_AMBULATORY_CARE_PROVIDER_SITE_OTHER): Payer: Medicare Other | Admitting: Psychiatry

## 2015-08-24 DIAGNOSIS — D512 Transcobalamin II deficiency: Secondary | ICD-10-CM | POA: Diagnosis not present

## 2015-08-29 ENCOUNTER — Encounter: Payer: Self-pay | Admitting: Pulmonary Disease

## 2015-08-29 ENCOUNTER — Ambulatory Visit: Payer: Self-pay | Admitting: General Surgery

## 2015-09-04 ENCOUNTER — Encounter: Payer: Medicare Other | Attending: General Surgery

## 2015-09-04 DIAGNOSIS — Z713 Dietary counseling and surveillance: Secondary | ICD-10-CM | POA: Diagnosis not present

## 2015-09-04 DIAGNOSIS — Z6841 Body Mass Index (BMI) 40.0 and over, adult: Secondary | ICD-10-CM | POA: Diagnosis not present

## 2015-09-04 NOTE — Progress Notes (Signed)
  Pre-Operative Nutrition Class:  Appt start time: 830   End time:  930.  Patient was seen on 09/04/2015 for Pre-Operative Bariatric Surgery Education at the [facility].   Surgery date: 09/19/2015 Surgery type: Sleeve gastrectomy Start weight at [facility]: 316 lbs on 07/22/15 Weight today: 313.5 lbs  TANITA  BODY COMP RESULTS  09/04/15   BMI (kg/m^2) 55.5   Fat Mass (lbs) 181.5   Fat Free Mass (lbs) 132   Total Body Water (lbs) 96.5   Samples given per MNT protocol. Patient educated on appropriate usage: Premier protein shake (vanilla - qty 1) Lot #: [id number] Exp: 02/2016  Celebrate Calcium Citrate chew (caramel - qty 1) Lot #: [id number] Exp: 11/2016  PB2 (qty 1) Lot #: [id number] Exp: 06/2017  Renee Pain Protein Powder (chicken soup - qty 1) Lot #: [id number] Exp: 07/2016  The following the learning objectives were met by the patient during this course:  Identify Pre-Op Dietary Goals and will begin 2 weeks pre-operatively  Identify appropriate sources of fluids and proteins   State protein recommendations and appropriate sources pre and post-operatively  Identify Post-Operative Dietary Goals and will follow for 2 weeks post-operatively  Identify appropriate multivitamin and calcium sources  Describe the need for physical activity post-operatively and will follow MD recommendations  State when to call healthcare provider regarding medication questions or post-operative complications  Handouts given during class include:  Pre-Op Bariatric Surgery Diet Handout  Protein Shake Handout  Post-Op Bariatric Surgery Nutrition Handout  [facility] Program Information Flyer  Support Group Information Flyer  [facility] Bariatric Supplements Price List  Follow-Up Plan: Patient will follow-up at [facility] 2 weeks post operatively for diet advancement per MD.

## 2015-09-14 ENCOUNTER — Ambulatory Visit: Payer: Self-pay | Admitting: General Surgery

## 2015-09-14 ENCOUNTER — Encounter (HOSPITAL_COMMUNITY): Payer: Self-pay

## 2015-09-14 ENCOUNTER — Encounter (HOSPITAL_COMMUNITY)
Admission: RE | Admit: 2015-09-14 | Discharge: 2015-09-14 | Disposition: A | Discharge status: Home / Self Care | Payer: Medicare Other | Source: Ambulatory Visit | Attending: General Surgery | Admitting: General Surgery

## 2015-09-14 DIAGNOSIS — Z01818 Encounter for other preprocedural examination: Secondary | ICD-10-CM | POA: Insufficient documentation

## 2015-09-14 DIAGNOSIS — E119 Type 2 diabetes mellitus without complications: Secondary | ICD-10-CM | POA: Insufficient documentation

## 2015-09-14 DIAGNOSIS — M5489 Other dorsalgia: Secondary | ICD-10-CM | POA: Diagnosis not present

## 2015-09-14 DIAGNOSIS — E669 Obesity, unspecified: Secondary | ICD-10-CM | POA: Diagnosis not present

## 2015-09-14 DIAGNOSIS — M545 Low back pain: Secondary | ICD-10-CM | POA: Diagnosis not present

## 2015-09-14 DIAGNOSIS — Z6841 Body Mass Index (BMI) 40.0 and over, adult: Secondary | ICD-10-CM | POA: Diagnosis not present

## 2015-09-14 DIAGNOSIS — J45909 Unspecified asthma, uncomplicated: Secondary | ICD-10-CM | POA: Diagnosis not present

## 2015-09-14 DIAGNOSIS — I10 Essential (primary) hypertension: Secondary | ICD-10-CM | POA: Diagnosis not present

## 2015-09-14 DIAGNOSIS — K219 Gastro-esophageal reflux disease without esophagitis: Secondary | ICD-10-CM | POA: Diagnosis not present

## 2015-09-14 DIAGNOSIS — G4733 Obstructive sleep apnea (adult) (pediatric): Secondary | ICD-10-CM | POA: Diagnosis not present

## 2015-09-14 HISTORY — DX: Anesthesia of skin: R20.0

## 2015-09-14 HISTORY — DX: Family history of other specified conditions: Z84.89

## 2015-09-14 HISTORY — DX: Personal history of other diseases of the respiratory system: Z87.09

## 2015-09-14 HISTORY — DX: Gastro-esophageal reflux disease without esophagitis: K21.9

## 2015-09-14 HISTORY — DX: Malignant (primary) neoplasm, unspecified: C80.1

## 2015-09-14 HISTORY — DX: Pneumonia, unspecified organism: J18.9

## 2015-09-14 LAB — COMPREHENSIVE METABOLIC PANEL
ALBUMIN: 4.1 g/dL (ref 3.5–5.0)
ALK PHOS: 84 U/L (ref 38–126)
ALT: 27 U/L (ref 14–54)
ANION GAP: 9 (ref 5–15)
AST: 24 U/L (ref 15–41)
BILIRUBIN TOTAL: 0.6 mg/dL (ref 0.3–1.2)
BUN: 20 mg/dL (ref 6–20)
CALCIUM: 9.6 mg/dL (ref 8.9–10.3)
CO2: 27 mmol/L (ref 22–32)
CREATININE: 0.6 mg/dL (ref 0.44–1.00)
Chloride: 106 mmol/L (ref 101–111)
GFR calc Af Amer: >60 mL/min (ref >60)
GFR calc non Af Amer: >60 mL/min (ref >60)
GLUCOSE: 93 mg/dL (ref 65–99)
Potassium: 3.5 mmol/L (ref 3.5–5.1)
SODIUM: 142 mmol/L (ref 135–145)
TOTAL PROTEIN: 7.6 g/dL (ref 6.5–8.1)

## 2015-09-14 LAB — CBC WITH DIFFERENTIAL/PLATELET
BASOS ABS: 0 10*3/uL (ref 0.0–0.1)
BASOS PCT: 0 %
EOS ABS: 0.2 10*3/uL (ref 0.0–0.7)
Eosinophils Relative: 3 %
HEMATOCRIT: 42 % (ref 36.0–46.0)
HEMOGLOBIN: 13.2 g/dL (ref 12.0–15.0)
Lymphocytes Relative: 28 %
Lymphs Abs: 2.4 10*3/uL (ref 0.7–4.0)
MCH: 26.9 pg (ref 26.0–34.0)
MCHC: 31.4 g/dL (ref 30.0–36.0)
MCV: 85.7 fL (ref 78.0–100.0)
MONOS PCT: 8 %
Monocytes Absolute: 0.7 10*3/uL (ref 0.1–1.0)
NEUTROS ABS: 5.1 10*3/uL (ref 1.7–7.7)
NEUTROS PCT: 61 %
Platelets: 189 10*3/uL (ref 150–400)
RBC: 4.9 MIL/uL (ref 3.87–5.11)
RDW: 14.8 % (ref 11.5–15.5)
WBC: 8.4 10*3/uL (ref 4.0–10.5)

## 2015-09-14 NOTE — H&P (Signed)
Kristina [name] 09/14/2015 3:48 PM Location: [facility] Patient #: [id number] DOB: 03-28-1971 Divorced / Language: Kristina [name] / Race: White Female  History of Present Illness Kristina [name] M. Kristina Snooks MD; 09/14/2015 5:04 PM) The patient is a 44 year old female who presents for a preoperative evaluation. She comes in today for her preoperative visit consultation she has been approved for laparoscopic sleeve gastrectomy. Surgery is next Tuesday. I initially met her on September 29. Her weight at that time was 315 pounds with a BMI of 54. She denies any medical changes since her initial visit. She denies any chest emergence or hospital. She did undergo cardiac evaluation and underwent a stress test which was negative. She was cleared by cardiology for surgery. She does have reflux and takes reflux medication daily. Her insurance recently no longer covered Protonix and she was switched to Prilosec. She stopped taking estrogen replacement about 3-1/2 weeks ago. She denies any chest pain or chest pressure. She denies any shortness of breath. She denies any abdominal pain. She denies any nausea or vomiting. She is using her CPAP mask intermittently. It causes a fair amount of congestion; however, she is planning on bringing her mass to the hospital.  Her chest x-ray and upper GI were within normal limits. Her hemoglobin A1c was 6.3. Total cholesterol level was 227. Triglyceride level was 313. HDL level was 39. Otherwise her initial evaluation labs were unremarkable.   Problem List/Past Medical Kristina [name] Kristina Derby, MD; 09/14/2015 5:05 PM) GASTROESOPHAGEAL REFLUX DISEASE, ESOPHAGITIS PRESENCE NOT SPECIFIED (K21.9) MORBID OBESITY WITH BMI OF 50.0-59.9, ADULT (E66.01)  Other Problems Kristina Curry, MD; 09/14/2015 5:05 PM) Thyroid Disease Oophorectomy Bilateral. Hypercholesterolemia SLEEP APNEA IN ADULT (G47.33) ESSENTIAL HYPERTENSION (H41) UNCOMPLICATED ASTHMA, UNSPECIFIED  ASTHMA SEVERITY (J45.909) DIABETES MELLITUS TYPE 2 IN OBESE (E11.9) BACK PAIN, LUMBOSACRAL (M54.5, M54.89) Depression Cholelithiasis Chest pain Anxiety Disorder  Past Surgical History Kristina Curry, MD; 09/14/2015 5:05 PM) Knee Surgery Right. Hysterectomy (not due to cancer) - Complete Gallbladder Surgery - Laparoscopic Colon Polyp Removal - Colonoscopy  Diagnostic Studies History Kristina Curry, MD; 09/14/2015 5:05 PM) Pap Smear 1-5 years ago Mammogram 1-3 years ago Colonoscopy within last year  Allergies Elbert Ewings, CMA; 09/14/2015 3:48 PM) Ampicillin *PENICILLINS* Codeine Sulfate *ANALGESICS - OPIOID* Sulfa 10 *OPHTHALMIC AGENTS*  Medication History Kristina Curry, MD; 09/14/2015 5:05 PM) Levothyroxine Sodium (100MCG Tablet, Oral) Active. Hydrochlorothiazide (25MG Tablet, Oral) Active. PROzac (20MG Capsule, Oral) Active. Allegra (180MG Tablet, Oral) Active. Medications Reconciled OxyCODONE HCl (5MG/5ML Solution, 5-10 Milliliter Oral every four hours, as needed, Taken starting 09/14/2015) Active. Ondansetron (4MG Tablet Disperse, 1 (one) Tablet Oral every six hours, as needed, Taken starting 09/14/2015) Active.  Social History Kristina Curry, MD; 09/14/2015 5:05 PM) Tobacco use Never smoker. No drug use No caffeine use Alcohol use Occasional alcohol use.  Family History Kristina Curry, MD; 09/14/2015 5:05 PM) Arthritis Father, Mother. Anesthetic complications Daughter. Colon Polyps Mother. Cerebrovascular Accident Mother. Cancer Mother. Thyroid problems Brother, Mother, Sister. Respiratory Condition Brother, Son. Migraine Headache Daughter. Heart Disease Father, Mother. Diabetes Mellitus Brother. Depression Mother. Hypertension Brother, Daughter, Father, Sister. Heart disease in female family member before age 66 Heart disease in female family member before age 26  Pregnancy / Birth History Kristina Curry, MD;  09/14/2015 5:05 PM) Age of menopause <45 Age at menarche 20 years. Irregular periods Gravida 5 Contraceptive History Oral contraceptives. Para 3 Maternal age <15     Review of Systems Kristina [name] M. Kristina Moger MD; 09/14/2015 5:02 PM)  General Present- Fatigue and Weight Gain. Not Present- Appetite Loss, Chills, Fever, Night Sweats and Weight Loss. Skin Present- Dryness. Not Present- Change in Wart/Mole, Hives, Jaundice, New Lesions, Non-Healing Wounds, Rash and Ulcer. HEENT Present- Seasonal Allergies and Sinus Pain. Not Present- Earache, Hearing Loss, Hoarseness, Nose Bleed, Oral Ulcers, Ringing in the Ears, Sore Throat, Visual Disturbances, Wears glasses/contact lenses and Yellow Eyes. Respiratory Not Present- Bloody sputum, Chronic Cough, Difficulty Breathing, Snoring and Wheezing. Breast Not Present- Breast Mass, Breast Pain, Nipple Discharge and Skin Changes. Cardiovascular Present- Palpitations and Rapid Heart Rate. Not Present- Chest Pain, Difficulty Breathing Lying Down, Leg Cramps, Shortness of Breath and Swelling of Extremities. Gastrointestinal Present- Bloating, Excessive gas and Indigestion. Not Present- Abdominal Pain, Bloody Stool, Change in Bowel Habits, Chronic diarrhea, Constipation, Difficulty Swallowing, Gets full quickly at meals, Hemorrhoids, Nausea, Rectal Pain and Vomiting. Female Genitourinary Not Present- Frequency, Nocturia, Painful Urination, Pelvic Pain and Urgency. Musculoskeletal Present- Back Pain and Joint Pain. Not Present- Joint Stiffness, Muscle Pain, Muscle Weakness and Swelling of Extremities. Neurological Present- Numbness. Not Present- Decreased Memory, Fainting, Headaches, Seizures, Tingling, Tremor, Trouble walking and Weakness. Psychiatric Present- Anxiety and Depression. Not Present- Bipolar, Change in Sleep Pattern, Fearful and Frequent crying. Endocrine Present- Excessive Hunger. Not Present- Cold Intolerance, Hair Changes, Heat Intolerance, Hot  flashes and New Diabetes. Hematology Not Present- Easy Bruising, Excessive bleeding, Gland problems, HIV and Persistent Infections.  Vitals Elbert Ewings CMA; 09/14/2015 3:49 PM) 09/14/2015 3:48 PM Weight: 310 lb Height: 63in Body Surface Area: 2.33 m Body Mass Index: 54.91 kg/m  Temp.: 82F(Temporal)  Pulse: 113 (Irregular)  Resp.: 18 (Unlabored)  BP: 146/80 (Sitting, Left Arm, Standard)      Physical Exam Kristina [name] M. Neal Trulson MD; 09/14/2015 5:02 PM)  The physical exam findings are as follows: Note:morbid obesity  General Mental Status-Alert. General Appearance-Consistent with stated age. Hydration-Well hydrated. Voice-Normal.  Head and Neck Head-normocephalic, atraumatic with no lesions or palpable masses. Trachea-midline. Thyroid Gland Characteristics - normal size and consistency.  Eye Eyeball - Bilateral-Extraocular movements intact. Sclera/Conjunctiva - Bilateral-No scleral icterus.  Chest and Lung Exam Chest and lung exam reveals -quiet, even and easy respiratory effort with no use of accessory muscles and on auscultation, normal breath sounds, no adventitious sounds and normal vocal resonance. Inspection Chest Wall - Normal. Back - normal.  Breast - Did not examine.  Cardiovascular Cardiovascular examination reveals -normal heart sounds, regular rate and rhythm with no murmurs and normal pedal pulses bilaterally.  Abdomen Inspection Inspection of the abdomen reveals - No Hernias. Skin - Scar - no surgical scars. Palpation/Percussion Palpation and Percussion of the abdomen reveal - Soft, Non Tender, No Rebound tenderness, No Rigidity (guarding) and No hepatosplenomegaly. Auscultation Auscultation of the abdomen reveals - Bowel sounds normal.  Peripheral Vascular Upper Extremity Palpation - Pulses bilaterally normal.  Neurologic Neurologic evaluation reveals -alert and oriented x 3 with no impairment of recent or remote  memory. Mental Status-Normal.  Neuropsychiatric The patient's mood and affect are described as -normal. Judgment and Insight-insight is appropriate concerning matters relevant to self.  Musculoskeletal Normal Exam - Left-Upper Extremity Strength Normal and Lower Extremity Strength Normal. Normal Exam - Right-Upper Extremity Strength Normal and Lower Extremity Strength Normal. Note: right knee scar; b/l knee crepitus  Lymphatic Head & Neck  General Head & Neck Lymphatics: Bilateral - Description - Normal. Axillary - Did not examine. Femoral & Inguinal - Did not examine.    Assessment & Plan Kristina [name] M. Gwendlyon Zumbro MD; 09/14/2015 5:05 PM)  MORBID OBESITY  WITH BMI OF 50.0-59.9, ADULT (E66.01) Impression: We rediscussed the typical postoperative course. We also discussed the potential of finding an incidental hiatal hernia during surgery. We discussed the typical postoperative pain control regimen along with nausea regimen. We reviewed her preoperative workup. She has lost about 5 pounds since her initial visit which I congratulated her on. She was given postoperative prescriptions for pain and nausea. All of her questions were asked and answered.  Current Plans Pt Education - EMW_preopbariatric Started OxyCODONE HCl 5MG/5ML, 5-10 Milliliter every four hours, as needed, 200 Milliliter, 09/14/2015, No Refill. Started Ondansetron 4MG, 1 (one) Tablet every six hours, as needed, #30, 09/14/2015, No Refill. GASTROESOPHAGEAL REFLUX DISEASE, ESOPHAGITIS PRESENCE NOT SPECIFIED (K21.9) Impression: per GI  DIABETES MELLITUS TYPE 2 IN OBESE (E11.9) Impression: per pcp  BACK PAIN, LUMBOSACRAL (M54.5) Impression: per pcp  SLEEP APNEA IN ADULT (G47.33) Impression: per PCP  ESSENTIAL HYPERTENSION (I10) Impression: per pcp  UNCOMPLICATED ASTHMA, UNSPECIFIED ASTHMA SEVERITY (J45.909) Impression: per pcp  Leighton Ruff. Redmond Pulling, MD, FACS General, Bariatric, & Minimally Invasive Surgery [facility], [place]

## 2015-09-14 NOTE — Progress Notes (Signed)
EKG/epic 08/01/2015 CXR/epic 08/01/2015 Stress test / epic 02/17/2015

## 2015-09-14 NOTE — Patient Instructions (Signed)
Kristina [name]  09/14/2015   Your procedure is scheduled on: Tuesday September 19, 2015  Report to [facility] Main  Entrance take [place]  elevators to 3rd floor to  [place] at 5:15 AM.  Call this number if you have problems the morning of surgery [phone]   Remember: ONLY 1 PERSON MAY GO WITH YOU TO SHORT STAY TO GET  READY MORNING OF [place].  Do not eat food or drink liquids :After Midnight.     Take these medicines the morning of surgery with A SIP OF WATER: Diazepam (Valium) if needed; May use albuterol inhaler if needed (bring with you day of surgery); May [place] Flonase if needed (bring with you day of surgery); Levothyroxine; Omeprazole (Prilosec); Oxycodone if needed  DO NOT TAKE ANY DIABETIC MEDICATIONS DAY OF YOUR SURGERY                               You may not have any metal on your body including hair pins and              piercings  Do not wear jewelry, make-up, lotions, powders or perfumes, deodorant             Do not wear nail polish.  Do not shave  48 hours prior to surgery.               Do not bring valuables to the hospital. [place].  Contacts, dentures or bridgework may not be worn into surgery.  Leave suitcase in the car. After surgery it may be brought to your room.   _____________________________________________________________________             [facility] Main - Preparing for Surgery Before surgery, you can play an important role.  Because skin is not sterile, your skin needs to be as free of germs as possible.  You can reduce the number of germs on your skin by washing with CHG (chlorahexidine gluconate) soap before surgery.  CHG is an antiseptic cleaner which kills germs and bonds with the skin to continue killing germs even after washing. Please DO NOT use if you have an allergy to CHG or antibacterial soaps.  If your skin becomes reddened/irritated stop using the CHG  and inform your nurse when you arrive at Short Stay. Do not shave (including legs and underarms) for at least 48 hours prior to the first CHG shower.  You may shave your face/neck. Please follow these instructions carefully:  1.  Shower with CHG Soap the night before surgery and the  morning of Surgery.  2.  If you choose to wash your hair, wash your hair first as usual with your  normal  shampoo.  3.  After you shampoo, rinse your hair and body thoroughly to remove the  shampoo.                           4.  Use CHG as you would any other liquid soap.  You can apply chg directly  to the skin and wash                       Gently  with a scrungie or clean washcloth.  5.  Apply the CHG Soap to your body ONLY FROM THE NECK DOWN.   Do not use on face/ open                           Wound or open sores. Avoid contact with eyes, ears mouth and genitals (private parts).                       Wash face,  Genitals (private parts) with your normal soap.             6.  Wash thoroughly, paying special attention to the area where your surgery  will be performed.  7.  Thoroughly rinse your body with warm water from the neck down.  8.  DO NOT shower/wash with your normal soap after using and rinsing off  the CHG Soap.                9.  Pat yourself dry with a clean towel.            10.  Wear clean pajamas.            11.  Place clean sheets on your bed the night of your first shower and do not  sleep with pets. Day of Surgery : Do not apply any lotions/deodorants the morning of surgery.  Please wear clean clothes to the hospital/surgery center.  FAILURE TO FOLLOW THESE INSTRUCTIONS MAY RESULT IN THE CANCELLATION OF YOUR SURGERY PATIENT SIGNATURE_________________________________  NURSE SIGNATURE__________________________________  ________________________________________________________________________

## 2015-09-15 LAB — HEMOGLOBIN A1C
Hgb A1c MFr Bld: 6.3 % — ABNORMAL HIGH (ref 4.8–5.6)
Mean Plasma Glucose: 134 mg/dL

## 2015-09-15 NOTE — Progress Notes (Signed)
HGA1C results in epic per PAT visit 09/14/2015 sent to Dr Redmond Pulling

## 2015-09-18 ENCOUNTER — Encounter (HOSPITAL_COMMUNITY): Payer: Self-pay | Admitting: Anesthesiology

## 2015-09-18 NOTE — Anesthesia Preprocedure Evaluation (Addendum)
Anesthesia Evaluation  Patient identified by MRN, date of birth, ID band Patient awake  General Assessment Comment:PMHx:   Past Medical History Diagnosis Date . Hypertension  . Asthma  . Restless leg  . High cholesterol  . Bulging disc  . Hypothyroidism  . Obstructive sleep apnea    uses c-pap at night . Obesity  . Vitamin D deficiency  . PONV (postoperative nausea and vomiting)  . Diabetes mellitus    diet control only . Lumbosacral spondylosis without myelopathy    history bulging disc-chronic pain(level 3 to 8). . Depression  . Anxiety  . Hx of adenomatous and sessile serrated colonic polyps  . Diverticulosis  . Normal cardiac stress test 02/2015   with normal EF . Palpitations  . Chronic cough  . Allergic rhinitis  . Dyspnea    chronic . Chronic back pain  . Vocal cord dysfunction        Reviewed: Allergy & Precautions, NPO status , Patient's Chart, lab work & pertinent test results  History of Anesthesia Complications (+) PONV, Family history of anesthesia reaction and history of anesthetic complications  Airway Mallampati: II  TM Distance: >3 FB Neck ROM: Full    Dental no notable dental hx.    Pulmonary shortness of breath and with exertion, asthma , sleep apnea and Continuous Positive Airway Pressure Ventilation , pneumonia, resolved,    Pulmonary exam normal breath sounds clear to auscultation       Cardiovascular hypertension, Pt. on medications Normal cardiovascular exam Rhythm:Regular Rate:Normal  Cardiology office visit with Dr. Debara Pickett on 07-13-15 reviewed.  Negative nuclear study with preserved EF 02-17-15   Neuro/Psych  Headaches, negative psych ROS   GI/Hepatic Neg liver ROS, GERD  Medicated,  Endo/Other  diabetesHypothyroidism Morbid obesity  Renal/GU negative Renal ROS  negative genitourinary    Musculoskeletal  (+) Arthritis ,   Abdominal (+) + obese,   Peds negative pediatric ROS (+)  Hematology negative hematology ROS (+)   Anesthesia Other Findings   Reproductive/Obstetrics negative OB ROS                         Anesthesia Physical Anesthesia Plan  ASA: III  Anesthesia Plan: General   Post-op Pain Management:    Induction: Intravenous  Airway Management Planned: Oral ETT  Additional Equipment:   Intra-op Plan:   Post-operative Plan: Extubation in OR  Informed Consent: I have reviewed the patients History and Physical, chart, labs and discussed the procedure including the risks, benefits and alternatives for the proposed anesthesia with the patient or authorized representative who has indicated his/her understanding and acceptance.   Dental advisory given  Plan Discussed with: CRNA  Anesthesia Plan Comments:         Anesthesia Quick Evaluation

## 2015-09-19 ENCOUNTER — Inpatient Hospital Stay (HOSPITAL_COMMUNITY): Payer: Medicare Other | Admitting: Anesthesiology

## 2015-09-19 ENCOUNTER — Encounter (HOSPITAL_COMMUNITY)
Admission: RE | Disposition: A | Discharge status: Home / Self Care | Payer: Self-pay | Source: Ambulatory Visit | Attending: General Surgery

## 2015-09-19 ENCOUNTER — Encounter (HOSPITAL_COMMUNITY): Payer: Self-pay | Admitting: *Deleted

## 2015-09-19 ENCOUNTER — Inpatient Hospital Stay (HOSPITAL_COMMUNITY)
Admission: RE | Admit: 2015-09-19 | Discharge: 2015-09-21 | DRG: 621 | Disposition: A | Discharge status: Home / Self Care | Payer: Medicare Other | Source: Ambulatory Visit | Attending: General Surgery | Admitting: General Surgery

## 2015-09-19 DIAGNOSIS — Z6841 Body Mass Index (BMI) 40.0 and over, adult: Secondary | ICD-10-CM

## 2015-09-19 DIAGNOSIS — I10 Essential (primary) hypertension: Secondary | ICD-10-CM | POA: Diagnosis present

## 2015-09-19 DIAGNOSIS — Z86010 Personal history of colonic polyps: Secondary | ICD-10-CM | POA: Diagnosis not present

## 2015-09-19 DIAGNOSIS — E781 Pure hyperglyceridemia: Secondary | ICD-10-CM | POA: Diagnosis not present

## 2015-09-19 DIAGNOSIS — E079 Disorder of thyroid, unspecified: Secondary | ICD-10-CM | POA: Diagnosis present

## 2015-09-19 DIAGNOSIS — Z8249 Family history of ischemic heart disease and other diseases of the circulatory system: Secondary | ICD-10-CM

## 2015-09-19 DIAGNOSIS — Z4651 Encounter for fitting and adjustment of gastric lap band: Secondary | ICD-10-CM | POA: Diagnosis not present

## 2015-09-19 DIAGNOSIS — E119 Type 2 diabetes mellitus without complications: Secondary | ICD-10-CM | POA: Diagnosis present

## 2015-09-19 DIAGNOSIS — G4733 Obstructive sleep apnea (adult) (pediatric): Secondary | ICD-10-CM | POA: Diagnosis present

## 2015-09-19 DIAGNOSIS — F419 Anxiety disorder, unspecified: Secondary | ICD-10-CM | POA: Diagnosis present

## 2015-09-19 DIAGNOSIS — K2950 Unspecified chronic gastritis without bleeding: Secondary | ICD-10-CM | POA: Diagnosis not present

## 2015-09-19 DIAGNOSIS — Z885 Allergy status to narcotic agent status: Secondary | ICD-10-CM

## 2015-09-19 DIAGNOSIS — Z809 Family history of malignant neoplasm, unspecified: Secondary | ICD-10-CM | POA: Diagnosis not present

## 2015-09-19 DIAGNOSIS — Z79899 Other long term (current) drug therapy: Secondary | ICD-10-CM

## 2015-09-19 DIAGNOSIS — K219 Gastro-esophageal reflux disease without esophagitis: Secondary | ICD-10-CM | POA: Diagnosis not present

## 2015-09-19 DIAGNOSIS — M545 Low back pain: Secondary | ICD-10-CM | POA: Diagnosis present

## 2015-09-19 DIAGNOSIS — Z8371 Family history of colonic polyps: Secondary | ICD-10-CM | POA: Diagnosis not present

## 2015-09-19 DIAGNOSIS — Z882 Allergy status to sulfonamides status: Secondary | ICD-10-CM | POA: Diagnosis not present

## 2015-09-19 DIAGNOSIS — R000 Tachycardia, unspecified: Secondary | ICD-10-CM | POA: Diagnosis not present

## 2015-09-19 DIAGNOSIS — G8929 Other chronic pain: Secondary | ICD-10-CM | POA: Diagnosis present

## 2015-09-19 DIAGNOSIS — J45909 Unspecified asthma, uncomplicated: Secondary | ICD-10-CM | POA: Diagnosis present

## 2015-09-19 DIAGNOSIS — Z9889 Other specified postprocedural states: Secondary | ICD-10-CM

## 2015-09-19 DIAGNOSIS — R110 Nausea: Secondary | ICD-10-CM

## 2015-09-19 DIAGNOSIS — K210 Gastro-esophageal reflux disease with esophagitis: Secondary | ICD-10-CM | POA: Diagnosis present

## 2015-09-19 DIAGNOSIS — Z01812 Encounter for preprocedural laboratory examination: Secondary | ICD-10-CM

## 2015-09-19 DIAGNOSIS — Z833 Family history of diabetes mellitus: Secondary | ICD-10-CM

## 2015-09-19 DIAGNOSIS — F329 Major depressive disorder, single episode, unspecified: Secondary | ICD-10-CM | POA: Diagnosis present

## 2015-09-19 DIAGNOSIS — E7800 Pure hypercholesterolemia, unspecified: Secondary | ICD-10-CM | POA: Diagnosis present

## 2015-09-19 DIAGNOSIS — K449 Diaphragmatic hernia without obstruction or gangrene: Secondary | ICD-10-CM | POA: Diagnosis not present

## 2015-09-19 DIAGNOSIS — M549 Dorsalgia, unspecified: Secondary | ICD-10-CM

## 2015-09-19 DIAGNOSIS — E782 Mixed hyperlipidemia: Secondary | ICD-10-CM | POA: Diagnosis present

## 2015-09-19 DIAGNOSIS — Z880 Allergy status to penicillin: Secondary | ICD-10-CM

## 2015-09-19 HISTORY — PX: UPPER GI ENDOSCOPY: SHX6162

## 2015-09-19 HISTORY — PX: LAPAROSCOPIC GASTRIC SLEEVE RESECTION WITH HIATAL HERNIA REPAIR: SHX6512

## 2015-09-19 LAB — GLUCOSE, CAPILLARY: GLUCOSE-CAPILLARY: 116 mg/dL — AB (ref 65–99)

## 2015-09-19 LAB — HEMOGLOBIN AND HEMATOCRIT, BLOOD
HCT: 42.4 % (ref 36.0–46.0)
HEMOGLOBIN: 13.3 g/dL (ref 12.0–15.0)

## 2015-09-19 SURGERY — ENDOSCOPY, UPPER GI TRACT
Anesthesia: General

## 2015-09-19 MED ORDER — SUCCINYLCHOLINE CHLORIDE 20 MG/ML IJ SOLN
INTRAMUSCULAR | Status: DC | PRN
  Administered 2015-09-19: 200 mg via INTRAVENOUS

## 2015-09-19 MED ORDER — SCOPOLAMINE 1 MG/3DAYS TD PT72
MEDICATED_PATCH | TRANSDERMAL | Status: AC
  Filled 2015-09-19: qty 1

## 2015-09-19 MED ORDER — FENTANYL CITRATE (PF) 100 MCG/2ML IJ SOLN
25.0000 ug | INTRAMUSCULAR | Status: DC | PRN
  Administered 2015-09-19 (×3): 50 ug via INTRAVENOUS

## 2015-09-19 MED ORDER — 0.9 % SODIUM CHLORIDE (POUR BTL) OPTIME
TOPICAL | Status: DC | PRN
  Administered 2015-09-19: 1000 mL

## 2015-09-19 MED ORDER — LACTATED RINGERS IV SOLN
INTRAVENOUS | Status: DC | PRN
  Administered 2015-09-19: 07:00:00 via INTRAVENOUS

## 2015-09-19 MED ORDER — ACETAMINOPHEN 10 MG/ML IV SOLN
1000.0000 mg | Freq: Four times a day (QID) | INTRAVENOUS | Status: AC
  Administered 2015-09-19 – 2015-09-20 (×4): 1000 mg via INTRAVENOUS
  Filled 2015-09-19 (×5): qty 100

## 2015-09-19 MED ORDER — PROPOFOL 10 MG/ML IV BOLUS
INTRAVENOUS | Status: AC
  Filled 2015-09-19: qty 40

## 2015-09-19 MED ORDER — TISSEEL VH 10 ML EX KIT
PACK | CUTANEOUS | Status: AC
  Filled 2015-09-19: qty 1

## 2015-09-19 MED ORDER — DEXAMETHASONE SODIUM PHOSPHATE 10 MG/ML IJ SOLN
INTRAMUSCULAR | Status: DC | PRN
  Administered 2015-09-19: 10 mg via INTRAVENOUS

## 2015-09-19 MED ORDER — PROPOFOL 10 MG/ML IV BOLUS
INTRAVENOUS | Status: DC | PRN
  Administered 2015-09-19: 200 mg via INTRAVENOUS

## 2015-09-19 MED ORDER — GLYCOPYRROLATE 0.2 MG/ML IJ SOLN
INTRAMUSCULAR | Status: AC
  Filled 2015-09-19: qty 3

## 2015-09-19 MED ORDER — ONDANSETRON HCL 4 MG/2ML IJ SOLN
INTRAMUSCULAR | Status: DC | PRN
  Administered 2015-09-19: 4 mg via INTRAVENOUS

## 2015-09-19 MED ORDER — NEOSTIGMINE METHYLSULFATE 10 MG/10ML IV SOLN
INTRAVENOUS | Status: AC
  Filled 2015-09-19: qty 1

## 2015-09-19 MED ORDER — SODIUM CHLORIDE 0.9 % IJ SOLN
INTRAMUSCULAR | Status: AC
  Filled 2015-09-19: qty 50

## 2015-09-19 MED ORDER — STERILE WATER FOR IRRIGATION IR SOLN
Status: DC | PRN
  Administered 2015-09-19: 2000 mL

## 2015-09-19 MED ORDER — SCOPOLAMINE 1 MG/3DAYS TD PT72
MEDICATED_PATCH | TRANSDERMAL | Status: DC | PRN
  Administered 2015-09-19: 1 via TRANSDERMAL

## 2015-09-19 MED ORDER — FENTANYL CITRATE (PF) 100 MCG/2ML IJ SOLN
INTRAMUSCULAR | Status: AC
  Filled 2015-09-19: qty 4

## 2015-09-19 MED ORDER — LABETALOL HCL 5 MG/ML IV SOLN
INTRAVENOUS | Status: DC | PRN
  Administered 2015-09-19: 2.5 mg via INTRAVENOUS

## 2015-09-19 MED ORDER — ACETAMINOPHEN 10 MG/ML IV SOLN
1000.0000 mg | Freq: Once | INTRAVENOUS | Status: AC
  Administered 2015-09-19: 1000 mg via INTRAVENOUS

## 2015-09-19 MED ORDER — FENTANYL CITRATE (PF) 100 MCG/2ML IJ SOLN
INTRAMUSCULAR | Status: DC | PRN
Start: 2015-09-19 — End: 2015-09-19
  Administered 2015-09-19: 50 ug via INTRAVENOUS
  Administered 2015-09-19: 100 ug via INTRAVENOUS
  Administered 2015-09-19 (×5): 50 ug via INTRAVENOUS
  Administered 2015-09-19: 100 ug via INTRAVENOUS

## 2015-09-19 MED ORDER — LACTATED RINGERS IV SOLN: INTRAVENOUS | Status: DC

## 2015-09-19 MED ORDER — METOCLOPRAMIDE HCL 5 MG/ML IJ SOLN
10.0000 mg | Freq: Three times a day (TID) | INTRAMUSCULAR | Status: DC | PRN
Start: 2015-09-19 — End: 2015-09-21
  Administered 2015-09-19 – 2015-09-20 (×2): 10 mg via INTRAVENOUS
  Filled 2015-09-19 (×2): qty 2

## 2015-09-19 MED ORDER — ENOXAPARIN SODIUM 30 MG/0.3ML ~~LOC~~ SOLN
30.0000 mg | Freq: Two times a day (BID) | SUBCUTANEOUS | Status: DC
  Administered 2015-09-20 – 2015-09-21 (×3): 30 mg via SUBCUTANEOUS
  Filled 2015-09-19 (×5): qty 0.3

## 2015-09-19 MED ORDER — LIDOCAINE HCL (CARDIAC) 20 MG/ML IV SOLN
INTRAVENOUS | Status: DC | PRN
  Administered 2015-09-19: 50 mg via INTRAVENOUS

## 2015-09-19 MED ORDER — SODIUM CHLORIDE 0.9 % IJ SOLN
INTRAMUSCULAR | Status: DC | PRN
  Administered 2015-09-19: 40 mL

## 2015-09-19 MED ORDER — ROCURONIUM BROMIDE 100 MG/10ML IV SOLN
INTRAVENOUS | Status: DC | PRN
  Administered 2015-09-19: 50 mg via INTRAVENOUS
  Administered 2015-09-19: 10 mg via INTRAVENOUS
  Administered 2015-09-19: 20 mg via INTRAVENOUS

## 2015-09-19 MED ORDER — ACETAMINOPHEN 160 MG/5ML PO SOLN
325.0000 mg | ORAL | Status: DC | PRN
  Administered 2015-09-20: 325 mg via ORAL
  Filled 2015-09-19: qty 20.3

## 2015-09-19 MED ORDER — PANTOPRAZOLE SODIUM 40 MG IV SOLR
40.0000 mg | Freq: Every day | INTRAVENOUS | Status: DC
  Administered 2015-09-19 – 2015-09-20 (×2): 40 mg via INTRAVENOUS
  Filled 2015-09-19 (×3): qty 40

## 2015-09-19 MED ORDER — LIDOCAINE HCL (CARDIAC) 20 MG/ML IV SOLN
INTRAVENOUS | Status: AC
  Filled 2015-09-19: qty 5

## 2015-09-19 MED ORDER — POTASSIUM CHLORIDE IN NACL 20-0.45 MEQ/L-% IV SOLN
INTRAVENOUS | Status: DC
  Administered 2015-09-19 – 2015-09-20 (×4): via INTRAVENOUS
  Administered 2015-09-20: 125 mL/h via INTRAVENOUS
  Filled 2015-09-19 (×12): qty 1000

## 2015-09-19 MED ORDER — EVICEL 5 ML EX KIT
PACK | Freq: Once | CUTANEOUS | Status: DC
  Filled 2015-09-19: qty 1

## 2015-09-19 MED ORDER — PROMETHAZINE HCL 25 MG/ML IJ SOLN
6.2500 mg | INTRAMUSCULAR | Status: DC | PRN
  Administered 2015-09-19: 6.25 mg via INTRAVENOUS

## 2015-09-19 MED ORDER — GLYCOPYRROLATE 0.2 MG/ML IJ SOLN
INTRAMUSCULAR | Status: DC | PRN
  Administered 2015-09-19: .6 mg via INTRAVENOUS

## 2015-09-19 MED ORDER — NEOSTIGMINE METHYLSULFATE 10 MG/10ML IV SOLN
INTRAVENOUS | Status: DC | PRN
  Administered 2015-09-19: 5 mg via INTRAVENOUS

## 2015-09-19 MED ORDER — ROCURONIUM BROMIDE 100 MG/10ML IV SOLN
INTRAVENOUS | Status: AC
  Filled 2015-09-19: qty 1

## 2015-09-19 MED ORDER — LEVOFLOXACIN IN D5W 750 MG/150ML IV SOLN
INTRAVENOUS | Status: AC
  Filled 2015-09-19: qty 150

## 2015-09-19 MED ORDER — ALBUTEROL SULFATE (2.5 MG/3ML) 0.083% IN NEBU: 2.5000 mg | INHALATION_SOLUTION | Freq: Four times a day (QID) | RESPIRATORY_TRACT | Status: DC | PRN

## 2015-09-19 MED ORDER — ACETAMINOPHEN 160 MG/5ML PO SOLN: 650.0000 mg | ORAL | Status: DC | PRN

## 2015-09-19 MED ORDER — PROMETHAZINE HCL 25 MG/ML IJ SOLN
12.5000 mg | Freq: Four times a day (QID) | INTRAMUSCULAR | Status: DC | PRN
  Administered 2015-09-19 – 2015-09-21 (×4): 12.5 mg via INTRAVENOUS
  Filled 2015-09-19 (×5): qty 1

## 2015-09-19 MED ORDER — BUPIVACAINE LIPOSOME 1.3 % IJ SUSP
20.0000 mL | Freq: Once | INTRAMUSCULAR | Status: AC
  Administered 2015-09-19: 20 mL
  Filled 2015-09-19: qty 20

## 2015-09-19 MED ORDER — HEPARIN SODIUM (PORCINE) 5000 UNIT/ML IJ SOLN
5000.0000 [IU] | INTRAMUSCULAR | Status: AC
  Administered 2015-09-19: 5000 [IU] via SUBCUTANEOUS
  Filled 2015-09-19: qty 1

## 2015-09-19 MED ORDER — PREMIER PROTEIN SHAKE: 2.0000 [oz_av] | Freq: Four times a day (QID) | ORAL | Status: DC

## 2015-09-19 MED ORDER — ONDANSETRON HCL 4 MG/2ML IJ SOLN
4.0000 mg | INTRAMUSCULAR | Status: DC | PRN
  Administered 2015-09-19 – 2015-09-21 (×5): 4 mg via INTRAVENOUS
  Filled 2015-09-19 (×5): qty 2

## 2015-09-19 MED ORDER — LEVOFLOXACIN IN D5W 750 MG/150ML IV SOLN
750.0000 mg | INTRAVENOUS | Status: AC
  Administered 2015-09-19: 750 mg via INTRAVENOUS

## 2015-09-19 MED ORDER — MIDAZOLAM HCL 2 MG/2ML IJ SOLN
INTRAMUSCULAR | Status: AC
  Filled 2015-09-19: qty 2

## 2015-09-19 MED ORDER — OXYCODONE HCL 5 MG/5ML PO SOLN
5.0000 mg | ORAL | Status: DC | PRN
  Administered 2015-09-20 – 2015-09-21 (×3): 10 mg via ORAL
  Filled 2015-09-19: qty 10
  Filled 2015-09-19: qty 50
  Filled 2015-09-19: qty 10
  Filled 2015-09-19: qty 5

## 2015-09-19 MED ORDER — FENTANYL CITRATE (PF) 100 MCG/2ML IJ SOLN
25.0000 ug | INTRAMUSCULAR | Status: DC | PRN
  Administered 2015-09-19: 50 ug via INTRAVENOUS
  Administered 2015-09-19 (×3): 75 ug via INTRAVENOUS
  Administered 2015-09-20: 50 ug via INTRAVENOUS
  Administered 2015-09-21: 75 ug via INTRAVENOUS
  Filled 2015-09-19 (×6): qty 2

## 2015-09-19 MED ORDER — FENTANYL CITRATE (PF) 250 MCG/5ML IJ SOLN
INTRAMUSCULAR | Status: AC
  Filled 2015-09-19: qty 5

## 2015-09-19 MED ORDER — CHLORHEXIDINE GLUCONATE 4 % EX LIQD: 60.0000 mL | Freq: Once | CUTANEOUS | Status: DC

## 2015-09-19 MED ORDER — PROMETHAZINE HCL 25 MG/ML IJ SOLN
INTRAMUSCULAR | Status: AC
  Filled 2015-09-19: qty 1

## 2015-09-19 MED ORDER — LACTATED RINGERS IR SOLN
Status: DC | PRN
  Administered 2015-09-19: 3000 mL

## 2015-09-19 MED ORDER — LABETALOL HCL 5 MG/ML IV SOLN
INTRAVENOUS | Status: AC
  Filled 2015-09-19: qty 4

## 2015-09-19 MED ORDER — MIDAZOLAM HCL 5 MG/5ML IJ SOLN
INTRAMUSCULAR | Status: DC | PRN
  Administered 2015-09-19: 2 mg via INTRAVENOUS

## 2015-09-19 MED ORDER — ACETAMINOPHEN 10 MG/ML IV SOLN
INTRAVENOUS | Status: AC
  Filled 2015-09-19: qty 100

## 2015-09-19 SURGICAL SUPPLY — 73 items
ADH SKN CLS APL DERMABOND .7 (GAUZE/BANDAGES/DRESSINGS) ×2
APL SRG 32X5 SNPLK LF DISP (MISCELLANEOUS)
APPLICATOR COTTON TIP 6IN STRL (MISCELLANEOUS) IMPLANT
APPLIER CLIP ROT 10 11.4 M/L (STAPLE)
APR CLP MED LRG 11.4X10 (STAPLE)
BAG SPEC RTRVL LRG 6X4 10 (ENDOMECHANICALS) ×2
BLADE SURG SZ11 CARB STEEL (BLADE) ×3 IMPLANT
CABLE HIGH FREQUENCY MONO STRZ (ELECTRODE) ×1 IMPLANT
CHLORAPREP W/TINT 26ML (MISCELLANEOUS) ×6 IMPLANT
CLIP APPLIE ROT 10 11.4 M/L (STAPLE) IMPLANT
COVER SURGICAL LIGHT HANDLE (MISCELLANEOUS) ×2 IMPLANT
DECANTER SPIKE VIAL GLASS SM (MISCELLANEOUS) ×1 IMPLANT
DERMABOND ADVANCED (GAUZE/BANDAGES/DRESSINGS) ×1
DERMABOND ADVANCED .7 DNX12 (GAUZE/BANDAGES/DRESSINGS) ×2 IMPLANT
DEVICE PMI PUNCTURE CLOSURE (MISCELLANEOUS) ×1 IMPLANT
DEVICE SUT QUICK LOAD TK 5 (STAPLE) ×1 IMPLANT
DEVICE SUT TI-KNOT TK 5X26 (MISCELLANEOUS) ×1 IMPLANT
DEVICE SUTURE ENDOST 10MM (ENDOMECHANICALS) ×1 IMPLANT
DISSECTOR BLUNT TIP ENDO 5MM (MISCELLANEOUS) ×1 IMPLANT
DRAPE CAMERA CLOSED 9X96 (DRAPES) ×3 IMPLANT
DRAPE UTILITY XL STRL (DRAPES) ×6 IMPLANT
ELECT REM PT RETURN 9FT ADLT (ELECTROSURGICAL) ×3
ELECTRODE REM PT RTRN 9FT ADLT (ELECTROSURGICAL) ×2 IMPLANT
GAUZE SPONGE 4X4 12PLY STRL (GAUZE/BANDAGES/DRESSINGS) IMPLANT
GLOVE BIOGEL M STRL SZ7.5 (GLOVE) ×3 IMPLANT
GLOVE BIOGEL PI IND STRL 8 (GLOVE) IMPLANT
GLOVE BIOGEL PI INDICATOR 8 (GLOVE) ×2
GOWN STRL REUS W/TWL XL LVL3 (GOWN DISPOSABLE) ×10 IMPLANT
HOVERMATT SINGLE USE (MISCELLANEOUS) ×3 IMPLANT
KIT BASIN OR (CUSTOM PROCEDURE TRAY) ×2 IMPLANT
KIT DEFENDO BUTTON (KITS) ×1 IMPLANT
MARKER SKIN DUAL TIP RULER LAB (MISCELLANEOUS) ×3 IMPLANT
NDL SPNL 22GX3.5 QUINCKE BK (NEEDLE) ×2 IMPLANT
NEEDLE SPNL 22GX3.5 QUINCKE BK (NEEDLE) ×3 IMPLANT
PACK UNIVERSAL I (CUSTOM PROCEDURE TRAY) ×3 IMPLANT
POUCH SPECIMEN RETRIEVAL 10MM (ENDOMECHANICALS) ×1 IMPLANT
RELOAD STAPLE 60 3.6 BLU REG (STAPLE) IMPLANT
RELOAD STAPLE 60 3.8 GOLD REG (STAPLE) IMPLANT
RELOAD STAPLE 60 4.1 GRN THCK (STAPLE) IMPLANT
RELOAD STAPLER BLUE 60MM (STAPLE) ×4 IMPLANT
RELOAD STAPLER GOLD 60MM (STAPLE) ×2 IMPLANT
RELOAD STAPLER GREEN 60MM (STAPLE) ×4 IMPLANT
SCISSORS LAP 5X45 EPIX DISP (ENDOMECHANICALS) ×3 IMPLANT
SEALANT SURGICAL APPL DUAL CAN (MISCELLANEOUS) IMPLANT
SET IRRIG TUBING LAPAROSCOPIC (IRRIGATION / IRRIGATOR) ×3 IMPLANT
SHEARS HARMONIC ACE PLUS 45CM (MISCELLANEOUS) ×3 IMPLANT
SLEEVE ADV FIXATION 5X100MM (TROCAR) ×6 IMPLANT
SLEEVE GASTRECTOMY 36FR VISIGI (MISCELLANEOUS) ×3 IMPLANT
SLEEVE XCEL OPT CAN 5 100 (ENDOMECHANICALS) IMPLANT
SOLUTION ANTI FOG 6CC (MISCELLANEOUS) ×3 IMPLANT
SPONGE LAP 18X18 X RAY DECT (DISPOSABLE) ×3 IMPLANT
STAPLER ECHELON BIOABSB 60 FLE (MISCELLANEOUS) ×9 IMPLANT
STAPLER ECHELON LONG 60 440 (INSTRUMENTS) ×1 IMPLANT
STAPLER RELOAD BLUE 60MM (STAPLE) ×6
STAPLER RELOAD GOLD 60MM (STAPLE) ×3
STAPLER RELOAD GREEN 60MM (STAPLE) ×6
SUT MNCRL AB 4-0 PS2 18 (SUTURE) ×4 IMPLANT
SUT SURGIDAC NAB ES-9 0 48 120 (SUTURE) ×1 IMPLANT
SUT VICRYL 0 TIES 12 18 (SUTURE) ×3 IMPLANT
SYR 10ML ECCENTRIC (SYRINGE) ×3 IMPLANT
SYR 20CC LL (SYRINGE) ×3 IMPLANT
SYR 50ML LL SCALE MARK (SYRINGE) ×3 IMPLANT
TOWEL OR 17X26 10 PK STRL BLUE (TOWEL DISPOSABLE) ×3 IMPLANT
TOWEL OR NON WOVEN STRL DISP B (DISPOSABLE) ×3 IMPLANT
TRAY FOLEY W/METER SILVER 14FR (SET/KITS/TRAYS/PACK) IMPLANT
TRAY FOLEY W/METER SILVER 16FR (SET/KITS/TRAYS/PACK) IMPLANT
TROCAR ADV FIXATION 5X100MM (TROCAR) ×3 IMPLANT
TROCAR BLADELESS 15MM (ENDOMECHANICALS) ×3 IMPLANT
TROCAR BLADELESS OPT 5 100 (ENDOMECHANICALS) ×3 IMPLANT
TUBING CONNECTING 10 (TUBING) ×3 IMPLANT
TUBING ENDO SMARTCAP (MISCELLANEOUS) ×3 IMPLANT
TUBING ENDO SMARTCAP PENTAX (MISCELLANEOUS) ×1 IMPLANT
TUBING FILTER THERMOFLATOR (ELECTROSURGICAL) ×3 IMPLANT

## 2015-09-19 NOTE — Anesthesia Procedure Notes (Signed)
Procedure Name: Intubation Date/Time: 09/19/2015 7:22 AM Performed by: Anne Fu Pre-anesthesia Checklist: Patient identified, Emergency Drugs available, Suction available, Patient being monitored and Timeout performed Patient Re-evaluated:Patient Re-evaluated prior to inductionOxygen Delivery Method: Circle system utilized Preoxygenation: Pre-oxygenation with 100% oxygen Intubation Type: IV induction Ventilation: Mask ventilation without difficulty Laryngoscope Size: Mac and 4 Grade View: Grade II Tube type: Oral Tube size: 7.5 mm Number of attempts: 1 Airway Equipment and Method: Stylet Placement Confirmation: ETT inserted through vocal cords under direct vision,  positive ETCO2,  CO2 detector and breath sounds checked- equal and bilateral Secured at: 21 cm Tube secured with: Tape Dental Injury: Teeth and Oropharynx as per pre-operative assessment

## 2015-09-19 NOTE — Op Note (Signed)
09/19/2015 Kristina [name] [birth year] [id number]   PRE-OPERATIVE DIAGNOSIS:     Morbid obesity BMI 55   Hypercholesterolemia with hypertriglyceridemia   Essential hypertension   GERD   Asthma   OSA (obstructive sleep apnea)   Diabetes mellitus (HCC)   Back pain, chronic  POST-OPERATIVE DIAGNOSIS:  Same + Hiatal hernia  PROCEDURE:  Procedure(s): LAPAROSCOPIC SLEEVE GASTRECTOMY WITH HIATAL HERNIA REPAIR UPPER GI ENDOSCOPY  SURGEON:  Surgeon(s): [name], MD FACS FASMBS  ASSISTANTS: Excell Seltzer, MD FACS  ANESTHESIA:   general  DRAINS: none   BOUGIE: 36 fr ViSiGi  LOCAL MEDICATIONS USED:  MARCAINE + Exparel  SPECIMEN:  Source of Specimen:  Greater curvature of stomach  DISPOSITION OF SPECIMEN:  PATHOLOGY  COUNTS:  YES  INDICATION FOR PROCEDURE: This is a very pleasant 45 year old morbidly obese female who has had unsuccessful attempts for sustained weight loss. She presents today for a planned laparoscopic sleeve gastrectomy with upper endoscopy. We have discussed the risk and benefits of the procedure extensively preoperatively. Please see my separate notes.  PROCEDURE: After obtaining informed consent and receiving 5000 units of subcutaneous heparin, the patient was brought to the operating room at [facility] and placed supine on the operating room table. General endotracheal anesthesia was established. Sequential compression devices were placed. A orogastric tube was placed. The patient's abdomen was prepped and draped in the usual standard surgical fashion. She received preoperative IV antibiotics. A surgical timeout was performed.  Access to the abdomen was achieved using a 5 mm 0 laparoscope thru a 5 mm trocar In the left upper Quadrant 2 fingerbreadths below the left subcostal margin using the Optiview technique. Pneumoperitoneum was smoothly established up to 15 mm of mercury. The laparoscope was advanced and the abdominal cavity was surveilled. The  patient was then placed in reverse Trendelenburg. There was no evidence of a hiatal hernia on laparoscopy - gap in the left and right crus anteriorly.  A 5 mm trocar was placed slightly above and to the left of the umbilicus under direct visualization.  The [facility] liver retractor was placed under the left lobe of the liver through a 5 mm trocar incision site in the subxiphoid position. A 5 mm trocar was placed in the lateral right upper quadrant along with a 15 mm trocar in the mid right abdomen. A final 5 mm trocar was placed in the lateral LUQ.  All under direct visualization after local had been infiltrated.  The stomach was inspected. It was completely decompressed and the orogastric tube was removed.  There was no anterior dimple that was obviously visible. The calibration tube was placed in the oropharynx and guided down into the stomach by the CRNA. 10 mL of air was insufflated into the calibration balloon. The calibration tubing was then gently pulled back by the CRNA and it slid past the GE junction. At this point the calibration tubing was desufflated and pulled back into the esophagus. This raised my suspicion of a clinically significant hiatal hernia. Her preop UGI showed no hiatal hernia but she does have GERD. The gastrohepatic ligament was incised with harmonic scalpel. The right crus was identified. We identified the crossing fat along the right crus. The adipose tissue just above this area was incised with harmonic scalpel. I then bluntly dissected out this area and identified the left crus. At this point there did not appear to be a significant defect and I dissected no further.   We identified the pylorus and measured 6  cm proximal to the pylorus and identified an area of where we would start taking down the short gastric vessels. Harmonic scalpel was used to take down the short gastric vessels along the greater curvature of the stomach. We were able to enter the lesser sac. We continued  to march along the greater curvature of the stomach taking down the short gastrics. As we approached the gastrosplenic ligament we took care in this area not to injure the spleen. We were able to take down the entire gastrosplenic ligament. We then mobilized the fundus away from the left crus of diaphragm; however it doing so it appeared that there was a hernia sac stuck to the left crus and stomach. Using laparoscopic kitners, I was able to lift this peritoneum off of the esophagus/proximal stomach, and it appeared there was in fact a hiatal hernia. I was able to identify the left crus and there was a gap b/t the crus and the stomach.  There was evidence of a hiatal hernia. I then switched back to the other side - right crus and mobilized the esophagus from the right crus. The left and right crus were further mobilized with blunt dissection. I was then able to reapproximate the left and right crus with 0 Ethibond using an Endostitch suture device and securing it with a titanium tyknot. We then had the CRNA readvanced the calibration tubing back into the stomach. 10 mL of air was insufflated into the calibration tube balloon. The calibration tube was then gently pulled back and there was resistance at the GE junction. The tube did not slide back up into the esophagus. At this point the calibration tubing was deflated and removed from the patient's body.  here were not any significant posterior gastric avascular attachments. This left the stomach completely mobilized. No vessels had been taken down along the lesser curvature of the stomach.  We then reidentified the pylorus. A 36Fr ViSiGi was then placed in the oropharynx and advanced down into the stomach and placed in the distal antrum and positioned along the lesser curvature. It was placed under suction which secured the 36Fr ViSiGi in place along the lesser curve. Then using the Ethicon echelon 60 mm stapler with a green load with Seamguard, I placed a  stapler along the antrum approximately 5 cm from the pylorus. The stapler was angled so that there is ample room at the angularis incisura. I then fired the first staple load after inspecting it posteriorly to ensure adequate space both anteriorly and posteriorly. At this point I still was not completely past the angularis so with another green load with Seamguard, I placed the stapler in position just inside the prior stapleline. We then rotated the stomach to insure that there was adequate anteriorly as well as posteriorly. The stapler was then fired. At this point I started using 60 mm gold load staple cartridges with Seamguard. The echelon stapler was then repositioned with a 60 mm gold load with Seamguard and we continued to march up along the [place]. My assistant was holding traction along the greater curvature stomach along the cauterized short gastric vessels ensuring that the stomach was symmetrically retracted. Prior to each firing of the staple, we rotated the stomach to ensure that there is adequate stomach left.  As we approached the fundus, I used 60 mm blue cartridge with Seamguard aiming slightly lateral to the esophageal fat pad.  Although the staples on this fire had completely gone thru the last part of the stomach  it had not completely cut it. Therefore 1 additional 60 blue load was used to free the remaining stomach. The sleeve was inspected. There is no evidence of cork screw. The staple line appeared hemostatic. The CRNA inflated the ViSiGi to the green zone and the upper abdomen was flooded with saline. There were no bubbles. The sleeve was decompressed and the ViSiGi removed. My assistant scrubbed out and performed an upper endoscopy. The sleeve easily distended with air and the scope was easily advanced to the pylorus. There is no evidence of internal bleeding or cork screwing. There was no narrowing at the angularis. There is no evidence of bubbles. Please see his operative note for  further details. The gastric sleeve was decompressed and the endoscope was removed.  The greater curvature the stomach was grasped with a laparoscopic grasper and removed from the 15 mm trocar site.  The liver retractor was removed. I then closed the 15 mm trocar site with 2 interrupted 0 Vicryl sutures through the fascia using the endoclose. The closure was viewed laparoscopically and it was airtight. 60 cc of Exparel was then infiltrated in the preperitoneal spaces around the trocar sites. Pneumoperitoneum was released. All trocar sites were closed with a 4-0 Monocryl in a subcuticular fashion followed by the application of Dermabond. The patient was extubated and taken to the recovery room in stable condition. All needle, instrument, and sponge counts were correct x2. There are no immediate complications  (2) 60 mm green with Seamguard (1) 60 mm gold with seamguard (2) 60 mm blue with seamguard  PLAN OF CARE: Admit to inpatient   PATIENT DISPOSITION:  PACU - hemodynamically stable.   Delay start of Pharmacological VTE agent (>24hrs) due to surgical blood loss or risk of bleeding:  no  Leighton Ruff. Redmond Pulling, MD, FACS FASMBS General, Bariatric, & Minimally Invasive Surgery [facility], [place]

## 2015-09-19 NOTE — Interval H&P Note (Signed)
History and Physical Interval Note:  09/19/2015 7:08 AM  Kristina [name]  has presented today for surgery, with the diagnosis of Morbid Obesity  The various methods of treatment have been discussed with the patient and family. After consideration of risks, benefits and other options for treatment, the patient has consented to  Procedure(s): LAPAROSCOPIC GASTRIC SLEEVE RESECTION (N/A) as a surgical intervention .  The patient's history has been reviewed, patient examined, no change in status, stable for surgery.  I have reviewed the patient's chart and labs.  Questions were answered to the patient's satisfaction.    Leighton Ruff. Redmond Pulling, MD, [place], Bariatric, & Minimally Invasive Surgery [facility], [place]  [facility]

## 2015-09-19 NOTE — Transfer of Care (Signed)
Immediate Anesthesia Transfer of Care Note  Patient: Kristina [name]  Procedure(s) Performed: Procedure(s): UPPER GI ENDOSCOPY LAPAROSCOPIC GASTRIC SLEEVE RESECTION WITH HIATAL HERNIA REPAIR (N/A)  Patient Location: PACU  Anesthesia Type:General  Level of Consciousness:  sedated, patient cooperative and responds to stimulation  Airway & Oxygen Therapy:Patient Spontanous Breathing and Patient connected to face mask oxgen  Post-op Assessment:  Report given to PACU RN and Post -op Vital signs reviewed and stable  Post vital signs:  Reviewed and stable  Last Vitals:  Filed Vitals:   09/19/15 0539 09/19/15 0945  BP: 127/62 162/90  Pulse: 83 106  Temp: 36.4 C 36.9 C  Resp: 18 14    Complications: No apparent anesthesia complications

## 2015-09-19 NOTE — Anesthesia Postprocedure Evaluation (Addendum)
Anesthesia Post Note  Patient: Kristina [name]  Procedure(s) Performed: Procedure(s) (LRB): UPPER GI ENDOSCOPY LAPAROSCOPIC GASTRIC SLEEVE RESECTION WITH HIATAL HERNIA REPAIR (N/A)  Patient location during evaluation: PACU Anesthesia Type: General Level of consciousness: awake and alert Pain management: pain level controlled Vital Signs Assessment: post-procedure vital signs reviewed and stable Respiratory status: spontaneous breathing, nonlabored ventilation, respiratory function stable and patient connected to nasal cannula oxygen Cardiovascular status: blood pressure returned to baseline and stable Postop Assessment: no signs of nausea or vomiting Anesthetic complications: no Comments: OSA precautions ordered. HR slightly elevated but at baseline. Elevated HR has not responded to narcotics.    Last Vitals:  Filed Vitals:   09/19/15 1115 09/19/15 1130  BP: 140/85 139/86  Pulse: 105 107  Temp:    Resp: 14 18    Last Pain:  Filed Vitals:   09/19/15 1131  PainSc: 6                  [name] J

## 2015-09-19 NOTE — Progress Notes (Signed)
RT set up CPAP and filled humidifier with sterile water. 2L O2 bled into CPAP circuit. Patient is resting comfortably at this time with O2 Sat 95%.

## 2015-09-19 NOTE — H&P (View-Only) (Signed)
Kristina [name] 09/14/2015 3:48 PM Location: [facility] Patient #: [id number] DOB: [birth year] Divorced / Language: Cleophus Molt / Race: White Female  History of Present Illness Randall Hiss M. Kimra Kantor MD; 09/14/2015 5:04 PM) The patient is a 45 year old female who presents for a preoperative evaluation. She comes in today for her preoperative visit consultation she has been approved for laparoscopic sleeve gastrectomy. Surgery is next Tuesday. I initially met her on September 29. Her weight at that time was 315 pounds with a BMI of 54. She denies any medical changes since her initial visit. She denies any chest emergence or hospital. She did undergo cardiac evaluation and underwent a stress test which was negative. She was cleared by cardiology for surgery. She does have reflux and takes reflux medication daily. Her insurance recently no longer covered Protonix and she was switched to Prilosec. She stopped taking estrogen replacement about 3-1/2 weeks ago. She denies any chest pain or chest pressure. She denies any shortness of breath. She denies any abdominal pain. She denies any nausea or vomiting. She is using her CPAP mask intermittently. It causes a fair amount of congestion; however, she is planning on bringing her mass to the hospital.  Her chest x-ray and upper GI were within normal limits. Her hemoglobin A1c was 6.3. Total cholesterol level was 227. Triglyceride level was 313. HDL level was 39. Otherwise her initial evaluation labs were unremarkable.   Problem List/Past Medical Randall Hiss Ronnie Derby, MD; 09/14/2015 5:05 PM) GASTROESOPHAGEAL REFLUX DISEASE, ESOPHAGITIS PRESENCE NOT SPECIFIED (K21.9) MORBID OBESITY WITH BMI OF 50.0-59.9, ADULT (E66.01)  Other Problems [name], MD; 09/14/2015 5:05 PM) Thyroid Disease Oophorectomy Bilateral. Hypercholesterolemia SLEEP APNEA IN ADULT (G47.33) ESSENTIAL HYPERTENSION (O24) UNCOMPLICATED ASTHMA, UNSPECIFIED  ASTHMA SEVERITY (J45.909) DIABETES MELLITUS TYPE 2 IN OBESE (E11.9) BACK PAIN, LUMBOSACRAL (M54.5, M54.89) Depression Cholelithiasis Chest pain Anxiety Disorder  Past Surgical History [name], MD; 09/14/2015 5:05 PM) Knee Surgery Right. Hysterectomy (not due to cancer) - Complete Gallbladder Surgery - Laparoscopic Colon Polyp Removal - Colonoscopy  Diagnostic Studies History [name], MD; 09/14/2015 5:05 PM) Pap Smear 1-5 years ago Mammogram 1-3 years ago Colonoscopy within last year  Allergies Elbert Ewings, CMA; 09/14/2015 3:48 PM) Ampicillin *PENICILLINS* Codeine Sulfate *ANALGESICS - OPIOID* Sulfa 10 *OPHTHALMIC AGENTS*  Medication History [name], MD; 09/14/2015 5:05 PM) Levothyroxine Sodium (100MCG Tablet, Oral) Active. Hydrochlorothiazide (25MG Tablet, Oral) Active. PROzac (20MG Capsule, Oral) Active. Allegra (180MG Tablet, Oral) Active. Medications Reconciled OxyCODONE HCl (5MG/5ML Solution, 5-10 Milliliter Oral every four hours, as needed, Taken starting 09/14/2015) Active. Ondansetron (4MG Tablet Disperse, 1 (one) Tablet Oral every six hours, as needed, Taken starting 09/14/2015) Active.  Social History [name], MD; 09/14/2015 5:05 PM) Tobacco use Never smoker. No drug use No caffeine use Alcohol use Occasional alcohol use.  Family History [name], MD; 09/14/2015 5:05 PM) Arthritis Father, Mother. Anesthetic complications Daughter. Colon Polyps Mother. Cerebrovascular Accident Mother. Cancer Mother. Thyroid problems Brother, Mother, Sister. Respiratory Condition Brother, Son. Migraine Headache Daughter. Heart Disease Father, Mother. Diabetes Mellitus Brother. Depression Mother. Hypertension Brother, Daughter, Father, Sister. Heart disease in female family member before age 10 Heart disease in female family member before age 41  Pregnancy / Birth History [name], MD;  09/14/2015 5:05 PM) Age of menopause <45 Age at menarche 69 years. Irregular periods Gravida 5 Contraceptive History Oral contraceptives. Para 3 Maternal age <15     Review of Systems Randall Hiss M. Love Milbourne MD; 09/14/2015 5:02 PM)  General Present- Fatigue and Weight Gain. Not Present- Appetite Loss, Chills, Fever, Night Sweats and Weight Loss. Skin Present- Dryness. Not Present- Change in Wart/Mole, Hives, Jaundice, New Lesions, Non-Healing Wounds, Rash and Ulcer. HEENT Present- Seasonal Allergies and Sinus Pain. Not Present- Earache, Hearing Loss, Hoarseness, Nose Bleed, Oral Ulcers, Ringing in the Ears, Sore Throat, Visual Disturbances, Wears glasses/contact lenses and Yellow Eyes. Respiratory Not Present- Bloody sputum, Chronic Cough, Difficulty Breathing, Snoring and Wheezing. Breast Not Present- Breast Mass, Breast Pain, Nipple Discharge and Skin Changes. Cardiovascular Present- Palpitations and Rapid Heart Rate. Not Present- Chest Pain, Difficulty Breathing Lying Down, Leg Cramps, Shortness of Breath and Swelling of Extremities. Gastrointestinal Present- Bloating, Excessive gas and Indigestion. Not Present- Abdominal Pain, Bloody Stool, Change in Bowel Habits, Chronic diarrhea, Constipation, Difficulty Swallowing, Gets full quickly at meals, Hemorrhoids, Nausea, Rectal Pain and Vomiting. Female Genitourinary Not Present- Frequency, Nocturia, Painful Urination, Pelvic Pain and Urgency. Musculoskeletal Present- Back Pain and Joint Pain. Not Present- Joint Stiffness, Muscle Pain, Muscle Weakness and Swelling of Extremities. Neurological Present- Numbness. Not Present- Decreased Memory, Fainting, Headaches, Seizures, Tingling, Tremor, Trouble walking and Weakness. Psychiatric Present- Anxiety and Depression. Not Present- Bipolar, Change in Sleep Pattern, Fearful and Frequent crying. Endocrine Present- Excessive Hunger. Not Present- Cold Intolerance, Hair Changes, Heat Intolerance, Hot  flashes and New Diabetes. Hematology Not Present- Easy Bruising, Excessive bleeding, Gland problems, HIV and Persistent Infections.  Vitals Elbert Ewings CMA; 09/14/2015 3:49 PM) 09/14/2015 3:48 PM Weight: 310 lb Height: 63in Body Surface Area: 2.33 m Body Mass Index: 54.91 kg/m  Temp.: 7F(Temporal)  Pulse: 113 (Irregular)  Resp.: 18 (Unlabored)  BP: 146/80 (Sitting, Left Arm, Standard)      Physical Exam Randall Hiss M. Melea Prezioso MD; 09/14/2015 5:02 PM)  The physical exam findings are as follows: Note:morbid obesity  General Mental Status-Alert. General Appearance-Consistent with stated age. Hydration-Well hydrated. Voice-Normal.  Head and Neck Head-normocephalic, atraumatic with no lesions or palpable masses. Trachea-midline. Thyroid Gland Characteristics - normal size and consistency.  Eye Eyeball - Bilateral-Extraocular movements intact. Sclera/Conjunctiva - Bilateral-No scleral icterus.  Chest and Lung Exam Chest and lung exam reveals -quiet, even and easy respiratory effort with no use of accessory muscles and on auscultation, normal breath sounds, no adventitious sounds and normal vocal resonance. Inspection Chest Wall - Normal. Back - normal.  Breast - Did not examine.  Cardiovascular Cardiovascular examination reveals -normal heart sounds, regular rate and rhythm with no murmurs and normal pedal pulses bilaterally.  Abdomen Inspection Inspection of the abdomen reveals - No Hernias. Skin - Scar - no surgical scars. Palpation/Percussion Palpation and Percussion of the abdomen reveal - Soft, Non Tender, No Rebound tenderness, No Rigidity (guarding) and No hepatosplenomegaly. Auscultation Auscultation of the abdomen reveals - Bowel sounds normal.  Peripheral Vascular Upper Extremity Palpation - Pulses bilaterally normal.  Neurologic Neurologic evaluation reveals -alert and oriented x 3 with no impairment of recent or remote  memory. Mental Status-Normal.  Neuropsychiatric The patient's mood and affect are described as -normal. Judgment and Insight-insight is appropriate concerning matters relevant to self.  Musculoskeletal Normal Exam - Left-Upper Extremity Strength Normal and Lower Extremity Strength Normal. Normal Exam - Right-Upper Extremity Strength Normal and Lower Extremity Strength Normal. Note: right knee scar; b/l knee crepitus  Lymphatic Head & Neck  General Head & Neck Lymphatics: Bilateral - Description - Normal. Axillary - Did not examine. Femoral & Inguinal - Did not examine.    Assessment & Plan Randall Hiss M. Paulette Rockford MD; 09/14/2015 5:05 PM)  MORBID OBESITY  WITH BMI OF 50.0-59.9, ADULT (E66.01) Impression: We rediscussed the typical postoperative course. We also discussed the potential of finding an incidental hiatal hernia during surgery. We discussed the typical postoperative pain control regimen along with nausea regimen. We reviewed her preoperative workup. She has lost about 5 pounds since her initial visit which I congratulated her on. She was given postoperative prescriptions for pain and nausea. All of her questions were asked and answered.  Current Plans Pt Education - EMW_preopbariatric Started OxyCODONE HCl 5MG/5ML, 5-10 Milliliter every four hours, as needed, 200 Milliliter, 09/14/2015, No Refill. Started Ondansetron 4MG, 1 (one) Tablet every six hours, as needed, #30, 09/14/2015, No Refill. GASTROESOPHAGEAL REFLUX DISEASE, ESOPHAGITIS PRESENCE NOT SPECIFIED (K21.9) Impression: per GI  DIABETES MELLITUS TYPE 2 IN OBESE (E11.9) Impression: per pcp  BACK PAIN, LUMBOSACRAL (M54.5) Impression: per pcp  SLEEP APNEA IN ADULT (G47.33) Impression: per PCP  ESSENTIAL HYPERTENSION (I10) Impression: per pcp  UNCOMPLICATED ASTHMA, UNSPECIFIED ASTHMA SEVERITY (J45.909) Impression: per pcp  Leighton Ruff. Redmond Pulling, MD, FACS General, Bariatric, & Minimally Invasive Surgery [facility], [place]

## 2015-09-19 NOTE — Progress Notes (Signed)
Utilization review completed.  

## 2015-09-20 ENCOUNTER — Inpatient Hospital Stay (HOSPITAL_COMMUNITY): Payer: Medicare Other

## 2015-09-20 LAB — CBC WITH DIFFERENTIAL/PLATELET
BASOS ABS: 0 10*3/uL (ref 0.0–0.1)
BASOS PCT: 0 %
EOS ABS: 0 10*3/uL (ref 0.0–0.7)
EOS PCT: 0 %
HCT: 38.9 % (ref 36.0–46.0)
Hemoglobin: 12.2 g/dL (ref 12.0–15.0)
Lymphocytes Relative: 23 %
Lymphs Abs: 2.1 10*3/uL (ref 0.7–4.0)
MCH: 27.2 pg (ref 26.0–34.0)
MCHC: 31.4 g/dL (ref 30.0–36.0)
MCV: 86.8 fL (ref 78.0–100.0)
MONO ABS: 0.9 10*3/uL (ref 0.1–1.0)
Monocytes Relative: 9 %
Neutro Abs: 6.3 10*3/uL (ref 1.7–7.7)
Neutrophils Relative %: 68 %
PLATELETS: 219 10*3/uL (ref 150–400)
RBC: 4.48 MIL/uL (ref 3.87–5.11)
RDW: 15.1 % (ref 11.5–15.5)
WBC: 9.3 10*3/uL (ref 4.0–10.5)

## 2015-09-20 LAB — COMPREHENSIVE METABOLIC PANEL
ALBUMIN: 3.7 g/dL (ref 3.5–5.0)
ALT: 79 U/L — ABNORMAL HIGH (ref 14–54)
AST: 80 U/L — AB (ref 15–41)
Alkaline Phosphatase: 73 U/L (ref 38–126)
Anion gap: 9 (ref 5–15)
BUN: 10 mg/dL (ref 6–20)
CHLORIDE: 105 mmol/L (ref 101–111)
CO2: 27 mmol/L (ref 22–32)
Calcium: 9.1 mg/dL (ref 8.9–10.3)
Creatinine, Ser: 0.7 mg/dL (ref 0.44–1.00)
GFR calc Af Amer: >60 mL/min (ref >60)
Glucose, Bld: 121 mg/dL — ABNORMAL HIGH (ref 65–99)
POTASSIUM: 4.4 mmol/L (ref 3.5–5.1)
SODIUM: 141 mmol/L (ref 135–145)
Total Bilirubin: 0.6 mg/dL (ref 0.3–1.2)
Total Protein: 7 g/dL (ref 6.5–8.1)

## 2015-09-20 LAB — HEMOGLOBIN AND HEMATOCRIT, BLOOD
HCT: 38.4 % (ref 36.0–46.0)
HEMOGLOBIN: 12.1 g/dL (ref 12.0–15.0)

## 2015-09-20 MED ORDER — PREMIER PROTEIN SHAKE
2.0000 [oz_av] | Freq: Four times a day (QID) | ORAL | Status: DC
  Administered 2015-09-20 – 2015-09-21 (×3): 2 [oz_av] via ORAL

## 2015-09-20 MED ORDER — IOHEXOL 300 MG/ML  SOLN
50.0000 mL | Freq: Once | INTRAMUSCULAR | Status: AC | PRN
  Administered 2015-09-20: 50 mL via ORAL

## 2015-09-20 MED ORDER — ALUM & MAG HYDROXIDE-SIMETH 200-200-20 MG/5ML PO SUSP: 15.0000 mL | Freq: Four times a day (QID) | ORAL | Status: DC | PRN

## 2015-09-20 NOTE — Progress Notes (Signed)
Came to assess pt, pt stated that she didn't need any help setting up her CPAP machine. Pt stated that if she has trouble with it she would notify RN to let RT know. I assure her how to use machine and stated if she needed RT to let your primary RN know. Pt is stable at this time, no distress or complications noted.

## 2015-09-20 NOTE — Plan of Care (Signed)
Problem: Food- and Nutrition-Related Knowledge Deficit (NB-1.1) Goal: Nutrition education Formal process to instruct or train a patient/client in a skill or to impart knowledge to help patients/clients voluntarily manage or modify food choices and eating behavior to maintain or improve health. Outcome: Completed/Met Date Met:  09/20/15 Nutrition Education Note  Received consult for diet education per DROP protocol.   Discussed 2 week post op diet with pt. Emphasized that liquids must be non carbonated, non caffeinated, and sugar free. Fluid goals discussed. Pt to follow up with outpatient bariatric RD for further diet progression after 2 weeks. Multivitamins and minerals also reviewed. Teach back method used, pt expressed understanding, expect good compliance.   Diet: First 2 Weeks  You will see the dietitian about two (2) weeks after your surgery. The dietitian will increase the types of foods you can eat if you are handling liquids well:  If you have severe vomiting or nausea and cannot handle clear liquids lasting longer than 1 day, call your surgeon  Protein Shake  Drink at least 2 ounces of shake 5-6 times per day  Each serving of protein shakes (usually 8 - 12 ounces) should have a minimum of:  15 grams of protein  And no more than 5 grams of carbohydrate  Goal for protein each day:  Men = 80 grams per day  Women = 60 grams per day  Protein powder may be added to fluids such as non-fat milk or Lactaid milk or Soy milk (limit to 35 grams added protein powder per serving)   Hydration  Slowly increase the amount of water and other clear liquids as tolerated (See Acceptable Fluids)  Slowly increase the amount of protein shake as tolerated  Sip fluids slowly and throughout the day  May use sugar substitutes in small amounts (no more than 6 - 8 packets per day; i.e. Splenda)   Fluid Goal  The first goal is to drink at least 8 ounces of protein shake/drink per day (or as directed by the  nutritionist); some examples of protein shakes are [employer], [employer], EAS Edge HP, and Unjury. See handout from pre-op Bariatric Education Class:  Slowly increase the amount of protein shake you drink as tolerated  You may find it easier to slowly sip shakes throughout the day  It is important to get your proteins in first  Your fluid goal is to drink 64 - 100 ounces of fluid daily  It may take a few weeks to build up to this  32 oz (or more) should be clear liquids  And  32 oz (or more) should be full liquids (see below for examples)  Liquids should not contain sugar, caffeine, or carbonation   Clear Liquids:  Water or Sugar-free flavored water (i.e. Fruit H2O, Propel)  Decaffeinated coffee or tea (sugar-free)  Crystal Lite, Wyler's Lite, Minute Maid Lite  Sugar-free Jell-O  Bouillon or broth  Sugar-free Popsicle: *Less than 20 calories each; Limit 1 per day   Full Liquids:  Protein Shakes/Drinks + 2 choices per day of other full liquids  Full liquids must be:  No More Than 12 grams of Carbs per serving  No More Than 3 grams of Fat per serving  Strained low-fat cream soup  Non-Fat milk  Fat-free Lactaid Milk  Sugar-free yogurt (Dannon Lite & Fit, Greek yogurt)     Clayton Bibles, MS, RD, LDN Pager: [phone] After Hours Pager: [phone]

## 2015-09-20 NOTE — Progress Notes (Signed)
1 Day Post-Op  Subjective: Had some nausea and epigastric discomfort. Pain ok and not much. Ambulated multiple times. Doing IS. Had some persistent tachycardia overnight  Objective: Vital signs in last 24 hours: Temp:  [97.8 F (36.6 C)-99.9 F (37.7 C)] 98.7 F (37.1 C) (01/04 0531) Pulse Rate:  [102-117] 110 (01/04 0531) Resp:  [10-19] 16 (01/04 0531) BP: (124-192)/(60-105) 140/86 mmHg (01/04 0531) SpO2:  [91 %-98 %] 96 % (01/04 0531) Weight:  [140.3 kg (309 lb 4.9 oz)] 140.3 kg (309 lb 4.9 oz) (01/04 0728) Last BM Date: 09/18/15  Intake/Output from previous day: 01/03 0701 - 01/04 0700 In: 4300 [I.V.:4000; IV Piggyback:300] Out: 1650 [Urine:1650] Intake/Output this shift:    Alert, resting comfortably, not ill appearing cta b/l Now regular Soft, min TTP, incisions c/d/i +scds  Lab Results:   Recent Labs  09/19/15 1000 09/20/15 0450  WBC  --  9.3  HGB 13.3 12.2  HCT 42.4 38.9  PLT  --  219   BMET  Recent Labs  09/20/15 0450  NA 141  K 4.4  CL 105  CO2 27  GLUCOSE 121*  BUN 10  CREATININE 0.70  CALCIUM 9.1   PT/INR No results for input(s): LABPROT, INR in the last 72 hours. ABG No results for input(s): PHART, HCO3 in the last 72 hours.  Invalid input(s): PCO2, PO2  Studies/Results: No results found.  Anti-infectives: Anti-infectives    Start     Dose/Rate Route Frequency Ordered Stop   09/19/15 0532  levofloxacin (LEVAQUIN) IVPB 750 mg     750 mg 100 mL/hr over 90 Minutes Intravenous On call to O.R. 09/19/15 0532 09/19/15 0725      Assessment/Plan: s/p Procedure(s): UPPER GI ENDOSCOPY LAPAROSCOPIC GASTRIC SLEEVE RESECTION WITH HIATAL HERNIA REPAIR (N/A)  Despite tachycardia, she looks good clinically Will proceed with POD 1 UGI. If looks ok, start POD 1 diet Scds, lovenox Ambulate, is cpap at night Antiemetics prob restart BP med later today  Leighton Ruff. Redmond Pulling, MD, FACS General, Bariatric, & Minimally Invasive Surgery [facility], [place]   LOS: 1 day    [name] 09/20/2015

## 2015-09-20 NOTE — Progress Notes (Signed)
Patient alert and oriented, Post op day 1.  Provided support and encouragement.  Encouraged pulmonary toilet, ambulation and small sips of liquids.  Patient doing extremely well with water and ambulation.  Spoke with MD and orders received to advance to POD2 diet, protein shake as tolerated.  All questions answered.  Will continue to monitor.

## 2015-09-21 LAB — CBC WITH DIFFERENTIAL/PLATELET
Basophils Absolute: 0 10*3/uL (ref 0.0–0.1)
Basophils Relative: 0 %
EOS PCT: 1 %
Eosinophils Absolute: 0.1 10*3/uL (ref 0.0–0.7)
HCT: 37.3 % (ref 36.0–46.0)
Hemoglobin: 11.5 g/dL — ABNORMAL LOW (ref 12.0–15.0)
LYMPHS ABS: 3.8 10*3/uL (ref 0.7–4.0)
LYMPHS PCT: 41 %
MCH: 27.3 pg (ref 26.0–34.0)
MCHC: 30.8 g/dL (ref 30.0–36.0)
MCV: 88.6 fL (ref 78.0–100.0)
MONO ABS: 0.7 10*3/uL (ref 0.1–1.0)
MONOS PCT: 8 %
Neutro Abs: 4.6 10*3/uL (ref 1.7–7.7)
Neutrophils Relative %: 50 %
PLATELETS: 188 10*3/uL (ref 150–400)
RBC: 4.21 MIL/uL (ref 3.87–5.11)
RDW: 15.4 % (ref 11.5–15.5)
WBC: 9.2 10*3/uL (ref 4.0–10.5)

## 2015-09-21 MED ORDER — LEVOTHYROXINE SODIUM 100 MCG PO TABS
100.0000 ug | ORAL_TABLET | Freq: Every day | ORAL | Status: DC
  Filled 2015-09-21 (×2): qty 1

## 2015-09-21 MED ORDER — FLUOXETINE HCL 20 MG PO CAPS
20.0000 mg | ORAL_CAPSULE | Freq: Every day | ORAL | Status: DC
  Filled 2015-09-21: qty 1

## 2015-09-21 MED ORDER — DIAZEPAM 5 MG PO TABS: 5.0000 mg | ORAL_TABLET | Freq: Two times a day (BID) | ORAL | Status: DC | PRN

## 2015-09-21 MED ORDER — OXYCODONE HCL 5 MG/5ML PO SOLN
5.0000 mg | ORAL | Status: DC | PRN
Start: 2015-09-21 — End: 2015-11-09

## 2015-09-21 NOTE — Discharge Instructions (Signed)

## 2015-09-21 NOTE — Progress Notes (Signed)
Patient alert and oriented, pain is controlled. Patient is tolerating fluids,  advanced to protein shake today, patient tolerated well.  Reviewed Gastric sleeve discharge instructions with patient and patient is able to articulate understanding.  Provided information on BELT program, Support Group and [facility] outpatient pharmacy. All questions answered, will continue to monitor.  

## 2015-09-21 NOTE — Discharge Summary (Signed)
Physician Discharge Summary  Kristina [name] [id number] DOB: 1970/12/10 DOA: 09/19/2015  PCP: Doree Albee, MD  Admit date: 09/19/2015 Discharge date: 09/21/2015  Recommendations for Outpatient Follow-up:  1.   Follow-up Information    Follow up with [name], MD. Go on 09/28/2015.   Specialty:  General Surgery   Why:  For Post-Op Check at 4:30   Contact information:   [address] [place] [address] [phone]       Follow up with [name], MD. Go on 11/02/2015.   Specialty:  General Surgery   Why:  For Post-Op Check at 9:30   Contact information:   [place] [address] [phone]      Discharge Diagnoses:  Principal Problem:   Morbid obesity ([place]) Active Problems:   Hypercholesterolemia with hypertriglyceridemia   Essential hypertension   GERD   Asthma   OSA (obstructive sleep apnea)   Diabetes mellitus (HCC)   Back pain, chronic   S/P laparoscopic sleeve gastrectomy with hiatal hernia repair   Surgical Procedure: Laparoscopic Sleeve Gastrectomy with hiatal hernia repair, upper endoscopy  Discharge Condition: Good Disposition: Home  Diet recommendation: Postoperative sleeve gastrectomy diet (liquids only)  Filed Weights   09/19/15 0529 09/20/15 0728  Weight: 140.332 kg (309 lb 6 oz) 140.3 kg (309 lb 4.9 oz)     Hospital Course:  The patient was admitted for a planned laparoscopic sleeve gastrectomy. Please see operative note. Preoperatively the patient was given 5000 units of subcutaneous heparin for DVT prophylaxis. Postoperative prophylactic Lovenox dosing was started on the morning of postoperative day 1. The patient underwent an upper GI on postoperative day 1 which demonstrated no extravasation of contrast and emptying of the contrast into the small bowel. The patient was started on ice chips and water which they tolerated. On postoperative day 2 The patient's diet was advanced to protein shakes  which they also tolerated. The patient was ambulating without difficulty. Their vital signs are stable without fever or tachycardia. Their hemoglobin had remained stable. The patient was maintained on their home settings for CPAP therapy. The patient had received discharge instructions and counseling. They were deemed stable for discharge.  Her fluid intake was acceptable for discharge. She has nausea with a lot of pain medications so she was instructed to premedicate with an antiemetic.    Discharge Instructions  Discharge Instructions    Ambulate hourly while awake    Complete by:  As directed      Call MD for:  difficulty breathing, headache or visual disturbances    Complete by:  As directed      Call MD for:  persistant dizziness or light-headedness    Complete by:  As directed      Call MD for:  persistant nausea and vomiting    Complete by:  As directed      Call MD for:  redness, tenderness, or signs of infection (pain, swelling, redness, odor or green/yellow discharge around incision site)    Complete by:  As directed      Call MD for:  severe uncontrolled pain    Complete by:  As directed      Call MD for:  temperature >101 F    Complete by:  As directed      Diet bariatric full liquid    Complete by:  As directed      Discharge instructions    Complete by:  As directed  See bariatric discharge instructions take zofran before taking pain pill to help decrease nausea with the pain medication     Incentive spirometry    Complete by:  As directed   Perform hourly while awake            Medication List    STOP taking these medications        ESTRADERM 0.05 MG/24HR patch  Generic drug:  estradiol     hydrochlorothiazide 25 MG tablet  Commonly known as:  HYDRODIURIL     ibuprofen 800 MG tablet  Commonly known as:  ADVIL,MOTRIN     oxyCODONE 15 MG immediate release tablet  Commonly known as:  ROXICODONE  Replaced by:  oxyCODONE 5 MG/5ML solution      testosterone cypionate 100 MG/ML injection  Commonly known as:  DEPOTESTOTERONE CYPIONATE      TAKE these medications        albuterol 108 (90 Base) MCG/ACT inhaler  Commonly known as:  PROVENTIL HFA;VENTOLIN HFA  Inhale 2 puffs into the lungs every 6 (six) hours as needed for wheezing or shortness of breath.     BENEFIBER DRINK MIX Pack  Take 1 packet by mouth daily.     Biotin 1000 MCG tablet  Take 1,000 mcg by mouth daily.     CALCIUM CITRATE PO  Take 3 tablets by mouth daily.     cholecalciferol 1000 units tablet  Commonly known as:  VITAMIN D  Take 5,000 Units by mouth daily.     diazepam 5 MG tablet  Commonly known as:  VALIUM  Take 5 mg by mouth 2 (two) times daily as needed for anxiety or muscle spasms.     FLUoxetine 20 MG capsule  Commonly known as:  PROZAC  Take 20 mg by mouth at bedtime.     fluticasone 50 MCG/ACT nasal spray  Commonly known as:  FLONASE  Place 2 sprays into both nostrils 2 (two) times daily. Only takes in the Spring.     levothyroxine 100 MCG tablet  Commonly known as:  SYNTHROID, LEVOTHROID  Take 100 mcg by mouth daily before breakfast.     multivitamin with minerals Tabs tablet  Take 1 tablet by mouth daily.     omeprazole 20 MG capsule  Commonly known as:  PRILOSEC  Take 20 mg by mouth daily.     ondansetron 4 MG tablet  Commonly known as:  ZOFRAN  Take 4 mg by mouth every 8 (eight) hours as needed for nausea or vomiting.     oxyCODONE 5 MG/5ML solution  Commonly known as:  ROXICODONE  Take 5-10 mLs (5-10 mg total) by mouth every 4 (four) hours as needed for moderate pain or severe pain.           Follow-up Information    Follow up with [name], MD. Go on 09/28/2015.   Specialty:  General Surgery   Why:  For Post-Op Check at 4:30   Contact information:   [address] [place] [address] [phone]       Follow up with [name], MD. Go on 11/02/2015.   Specialty:  General Surgery   Why:  For  Post-Op Check at 9:30   Contact information:   [place] [address] [phone]        The results of significant diagnostics from this hospitalization (including imaging, microbiology, ancillary and laboratory) are listed below for reference.    Significant Diagnostic  Studies: Dg Ugi W/water Sol Cm  09/20/2015  CLINICAL DATA:  Gastric sleeve procedure yesterday. EXAM: WATER SOLUBLE UPPER GI SERIES TECHNIQUE: Single-column upper GI series was performed using water soluble contrast. CONTRAST:  12mL OMNIPAQUE IOHEXOL 300 MG/ML  SOLN COMPARISON:  08/01/2015. FLUOROSCOPY TIME:  Radiation Exposure Index (as provided by the fluoroscopic device): 35 mGy If the device does not provide the exposure index: Fluoroscopy Time (in minutes and seconds):  0 minutes 30 seconds Number of Acquired Images:  To FINDINGS: Scout view of the abdomen shows postoperative changes in the left upper quadrant. Bowel gas pattern is otherwise unremarkable. Surgical clips in the right upper quadrant. Patient drank 50 cc water soluble contrast. Postoperative changes of gastric sleeve procedure are seen. Contrast passed readily into the proximal small bowel which is minimally dilated. IMPRESSION: Gastric sleeve procedure without complicating feature. Electronically Signed   By: Lorin Picket M.D.   On: 09/20/2015 10:09    Labs: Basic Metabolic Panel:  Recent Labs Lab 09/14/15 1330 09/20/15 0450  NA 142 141  K 3.5 4.4  CL 106 105  CO2 27 27  GLUCOSE 93 121*  BUN 20 10  CREATININE 0.60 0.70  CALCIUM 9.6 9.1   Liver Function Tests:  Recent Labs Lab 09/14/15 1330 09/20/15 0450  AST 24 80*  ALT 27 79*  ALKPHOS 84 73  BILITOT 0.6 0.6  PROT 7.6 7.0  ALBUMIN 4.1 3.7    CBC:  Recent Labs Lab 09/14/15 1330 09/19/15 1000 09/20/15 0450 09/20/15 1620 09/21/15 0514  WBC 8.4  --  9.3  --  9.2  NEUTROABS 5.1  --  6.3  --  4.6  HGB 13.2 13.3 12.2 12.1 11.5*  HCT 42.0 42.4 38.9 38.4  37.3  MCV 85.7  --  86.8  --  88.6  PLT 189  --  219  --  188    CBG:  Recent Labs Lab 09/19/15 0532  GLUCAP 116*    Principal Problem:   Morbid obesity ([place]) Active Problems:   Hypercholesterolemia with hypertriglyceridemia   Essential hypertension   GERD   Asthma   OSA (obstructive sleep apnea)   Diabetes mellitus (HCC)   Back pain, chronic   S/P laparoscopic sleeve gastrectomy   Time coordinating discharge: 15 min  Signed:  [name], MD [facility], [place] 09/21/2015, 11:53 AM

## 2015-09-21 NOTE — Progress Notes (Signed)
2 Days Post-Op  Subjective: Some nausea this am, continued soreness. Couldn't sleep well last night. Ambulating. Drank some shake several ounces  Objective: Vital signs in last 24 hours: Temp:  [97.5 F (36.4 C)-98.5 F (36.9 C)] 97.7 F (36.5 C) (01/05 0553) Pulse Rate:  [90-101] 93 (01/05 0553) Resp:  [18-21] 18 (01/05 0553) BP: (115-156)/(66-89) 137/89 mmHg (01/05 0553) SpO2:  [90 %-99 %] 97 % (01/05 0553) Last BM Date: 09/18/15  Intake/Output from previous day: 01/04 0701 - 01/05 0700 In: 3260 [P.O.:160; I.V.:3000; IV Piggyback:100] Out: 3000 [Urine:3000] Intake/Output this shift:    Alert, nontoxic, resting comfortably cta b/l Reg Soft, obese, expected TTP at extraction site. No cellulitis, incisions c/d/i No edema  Lab Results:   Recent Labs  09/20/15 0450 09/20/15 1620 09/21/15 0514  WBC 9.3  --  9.2  HGB 12.2 12.1 11.5*  HCT 38.9 38.4 37.3  PLT 219  --  188   BMET  Recent Labs  09/20/15 0450  NA 141  K 4.4  CL 105  CO2 27  GLUCOSE 121*  BUN 10  CREATININE 0.70  CALCIUM 9.1   PT/INR No results for input(s): LABPROT, INR in the last 72 hours. ABG No results for input(s): PHART, HCO3 in the last 72 hours.  Invalid input(s): PCO2, PO2  Studies/Results: Dg Ugi W/water Sol Cm  09/20/2015  CLINICAL DATA:  Gastric sleeve procedure yesterday. EXAM: WATER SOLUBLE UPPER GI SERIES TECHNIQUE: Single-column upper GI series was performed using water soluble contrast. CONTRAST:  26mL OMNIPAQUE IOHEXOL 300 MG/ML  SOLN COMPARISON:  08/01/2015. FLUOROSCOPY TIME:  Radiation Exposure Index (as provided by the fluoroscopic device): 35 mGy If the device does not provide the exposure index: Fluoroscopy Time (in minutes and seconds):  0 minutes 30 seconds Number of Acquired Images:  To FINDINGS: Scout view of the abdomen shows postoperative changes in the left upper quadrant. Bowel gas pattern is otherwise unremarkable. Surgical clips in the right upper quadrant. Patient  drank 50 cc water soluble contrast. Postoperative changes of gastric sleeve procedure are seen. Contrast passed readily into the proximal small bowel which is minimally dilated. IMPRESSION: Gastric sleeve procedure without complicating feature. Electronically Signed   By: Lorin Picket M.D.   On: 09/20/2015 10:09    Anti-infectives: Anti-infectives    Start     Dose/Rate Route Frequency Ordered Stop   09/19/15 0532  levofloxacin (LEVAQUIN) IVPB 750 mg     750 mg 100 mL/hr over 90 Minutes Intravenous On call to O.R. 09/19/15 0532 09/19/15 0725      Assessment/Plan: s/p Procedure(s): UPPER GI ENDOSCOPY LAPAROSCOPIC GASTRIC SLEEVE RESECTION WITH HIATAL HERNIA REPAIR (N/A)  No fever, no tachycardia. Labs ok Cont POD 2 diet Reassess PO later this am to determine if adequate PO to d/c Discussed dc instructions.  Leighton Ruff. Redmond Pulling, MD, FACS General, Bariatric, & Minimally Invasive Surgery [facility], [place]   LOS: 2 days    [name] 09/21/2015

## 2015-09-24 ENCOUNTER — Encounter (HOSPITAL_BASED_OUTPATIENT_CLINIC_OR_DEPARTMENT_OTHER): Payer: Medicare Other

## 2015-09-26 ENCOUNTER — Telehealth (HOSPITAL_COMMUNITY): Payer: Self-pay

## 2015-09-26 NOTE — Telephone Encounter (Signed)

## 2015-09-27 ENCOUNTER — Encounter (HOSPITAL_COMMUNITY): Payer: Self-pay

## 2015-09-27 ENCOUNTER — Ambulatory Visit (HOSPITAL_COMMUNITY)
Admission: RE | Admit: 2015-09-27 | Discharge: 2015-09-27 | Disposition: A | Discharge status: Home / Self Care | Payer: Medicare Other | Source: Ambulatory Visit | Attending: General Surgery | Admitting: General Surgery

## 2015-09-27 ENCOUNTER — Other Ambulatory Visit (HOSPITAL_COMMUNITY): Payer: Self-pay | Admitting: General Surgery

## 2015-09-27 ENCOUNTER — Ambulatory Visit (HOSPITAL_COMMUNITY): Payer: Medicare Other

## 2015-09-27 DIAGNOSIS — M79605 Pain in left leg: Secondary | ICD-10-CM | POA: Diagnosis not present

## 2015-09-27 DIAGNOSIS — R52 Pain, unspecified: Secondary | ICD-10-CM

## 2015-09-27 NOTE — Progress Notes (Signed)
Preliminary results by tech - Left Lower Ext. Venous Duplex Completed. Negative for deep and superficial vein thrombosis in the left leg. Britaney Espaillat, BS, RDMS, RVT  

## 2015-10-03 ENCOUNTER — Encounter: Payer: Medicare Other | Attending: General Surgery

## 2015-10-03 DIAGNOSIS — Z713 Dietary counseling and surveillance: Secondary | ICD-10-CM | POA: Diagnosis not present

## 2015-10-03 DIAGNOSIS — Z6841 Body Mass Index (BMI) 40.0 and over, adult: Secondary | ICD-10-CM | POA: Diagnosis not present

## 2015-10-03 NOTE — Progress Notes (Signed)
Bariatric Class:  Appt start time: 330 end time:  400.  2 Week Post-Operative Nutrition Class  Patient was seen on 10/03/2015 for Post-Operative Nutrition education at [facility].  Patient reports she has been meeting fluid and protein needs. She is tolerating solid foods such as eggs, fish, cheese, and deli meat. However, 2 days ago she began vomiting. She reports that her surgeon recommended that she resume liquids for 24 hours and prescribed phenergan. [place]ene states she is feeling well otherwise. She is walking regularly and trying to pay attention to her hunger and fullness cues. No longer on medication for blood pressure. Notes taste and temperature sensitivities with beverages.  Surgery date: 09/19/2015 Surgery type: Sleeve gastrectomy [facility] weight [facility]: 316 lbs on 07/22/15 Weight today: 295 lbs Weight change: 18.5 lbs  TANITA  BODY COMP RESULTS  09/04/15 10/03/15   BMI (kg/m^2) 55.5 52.3   Fat Mass (lbs) 181.5 167.5   Fat Free Mass (lbs) 132 127.5   Total Body Water (lbs) 96.5 93.5    The following the learning objectives were met by the patient during this course:  Identifies Phase 3A (Soft, High Proteins) Dietary Goals and will begin from 2 weeks post-operatively to 2 months post-operatively  Identifies appropriate sources of fluids and proteins   States protein recommendations and appropriate sources post-operatively  Identifies the need for appropriate texture modifications, mastication, and bite sizes when consuming solids  Identifies appropriate multivitamin and calcium sources post-operatively  Describes the need for physical activity post-operatively and will follow MD recommendations  States when to call healthcare provider regarding medication questions or post-operative complications  Handouts given during class include:  Phase 3A: Soft, High Protein Diet Handout  Follow-Up Plan: Patient will follow-up at [facility] in 6 weeks for 8  week post-op nutrition visit for diet advancement per MD.

## 2015-10-17 DIAGNOSIS — N959 Unspecified menopausal and perimenopausal disorder: Secondary | ICD-10-CM | POA: Diagnosis not present

## 2015-10-17 DIAGNOSIS — E559 Vitamin D deficiency, unspecified: Secondary | ICD-10-CM | POA: Diagnosis not present

## 2015-10-17 DIAGNOSIS — E119 Type 2 diabetes mellitus without complications: Secondary | ICD-10-CM | POA: Diagnosis not present

## 2015-10-17 DIAGNOSIS — D511 Vitamin B12 deficiency anemia due to selective vitamin B12 malabsorption with proteinuria: Secondary | ICD-10-CM | POA: Diagnosis not present

## 2015-10-17 DIAGNOSIS — E785 Hyperlipidemia, unspecified: Secondary | ICD-10-CM | POA: Diagnosis not present

## 2015-10-17 DIAGNOSIS — E039 Hypothyroidism, unspecified: Secondary | ICD-10-CM | POA: Diagnosis not present

## 2015-10-17 DIAGNOSIS — I10 Essential (primary) hypertension: Secondary | ICD-10-CM | POA: Diagnosis not present

## 2015-10-17 DIAGNOSIS — E538 Deficiency of other specified B group vitamins: Secondary | ICD-10-CM | POA: Diagnosis not present

## 2015-11-09 ENCOUNTER — Observation Stay (HOSPITAL_COMMUNITY): Payer: Medicare Other

## 2015-11-09 ENCOUNTER — Encounter (HOSPITAL_COMMUNITY): Payer: Self-pay

## 2015-11-09 ENCOUNTER — Observation Stay (HOSPITAL_COMMUNITY)
Admission: EM | Admit: 2015-11-09 | Discharge: 2015-11-10 | Disposition: A | Discharge status: Home / Self Care | Payer: Medicare Other | Attending: Internal Medicine | Admitting: Internal Medicine

## 2015-11-09 DIAGNOSIS — E039 Hypothyroidism, unspecified: Secondary | ICD-10-CM | POA: Insufficient documentation

## 2015-11-09 DIAGNOSIS — E876 Hypokalemia: Secondary | ICD-10-CM

## 2015-11-09 DIAGNOSIS — K5790 Diverticulosis of intestine, part unspecified, without perforation or abscess without bleeding: Secondary | ICD-10-CM | POA: Diagnosis not present

## 2015-11-09 DIAGNOSIS — I10 Essential (primary) hypertension: Secondary | ICD-10-CM | POA: Insufficient documentation

## 2015-11-09 DIAGNOSIS — R197 Diarrhea, unspecified: Secondary | ICD-10-CM | POA: Diagnosis not present

## 2015-11-09 DIAGNOSIS — F419 Anxiety disorder, unspecified: Secondary | ICD-10-CM | POA: Insufficient documentation

## 2015-11-09 DIAGNOSIS — K529 Noninfective gastroenteritis and colitis, unspecified: Secondary | ICD-10-CM | POA: Diagnosis present

## 2015-11-09 DIAGNOSIS — K219 Gastro-esophageal reflux disease without esophagitis: Secondary | ICD-10-CM | POA: Insufficient documentation

## 2015-11-09 DIAGNOSIS — E559 Vitamin D deficiency, unspecified: Secondary | ICD-10-CM | POA: Diagnosis not present

## 2015-11-09 DIAGNOSIS — R1110 Vomiting, unspecified: Secondary | ICD-10-CM

## 2015-11-09 DIAGNOSIS — Z86010 Personal history of colonic polyps: Secondary | ICD-10-CM | POA: Insufficient documentation

## 2015-11-09 DIAGNOSIS — E7800 Pure hypercholesterolemia, unspecified: Secondary | ICD-10-CM | POA: Diagnosis not present

## 2015-11-09 DIAGNOSIS — E860 Dehydration: Secondary | ICD-10-CM | POA: Diagnosis not present

## 2015-11-09 DIAGNOSIS — F329 Major depressive disorder, single episode, unspecified: Secondary | ICD-10-CM | POA: Diagnosis not present

## 2015-11-09 DIAGNOSIS — Z79899 Other long term (current) drug therapy: Secondary | ICD-10-CM | POA: Insufficient documentation

## 2015-11-09 DIAGNOSIS — J45909 Unspecified asthma, uncomplicated: Secondary | ICD-10-CM | POA: Diagnosis not present

## 2015-11-09 DIAGNOSIS — G8929 Other chronic pain: Secondary | ICD-10-CM | POA: Insufficient documentation

## 2015-11-09 DIAGNOSIS — E119 Type 2 diabetes mellitus without complications: Secondary | ICD-10-CM | POA: Insufficient documentation

## 2015-11-09 DIAGNOSIS — G2581 Restless legs syndrome: Secondary | ICD-10-CM | POA: Diagnosis not present

## 2015-11-09 DIAGNOSIS — Z8701 Personal history of pneumonia (recurrent): Secondary | ICD-10-CM | POA: Diagnosis not present

## 2015-11-09 DIAGNOSIS — G4733 Obstructive sleep apnea (adult) (pediatric): Secondary | ICD-10-CM | POA: Insufficient documentation

## 2015-11-09 DIAGNOSIS — E669 Obesity, unspecified: Secondary | ICD-10-CM | POA: Diagnosis not present

## 2015-11-09 DIAGNOSIS — R112 Nausea with vomiting, unspecified: Principal | ICD-10-CM | POA: Insufficient documentation

## 2015-11-09 LAB — COMPREHENSIVE METABOLIC PANEL
ALK PHOS: 102 U/L (ref 38–126)
ALT: 37 U/L (ref 14–54)
AST: 28 U/L (ref 15–41)
Albumin: 4.4 g/dL (ref 3.5–5.0)
Anion gap: 12 (ref 5–15)
BUN: 15 mg/dL (ref 6–20)
CALCIUM: 9.3 mg/dL (ref 8.9–10.3)
CO2: 22 mmol/L (ref 22–32)
CREATININE: 0.72 mg/dL (ref 0.44–1.00)
Chloride: 108 mmol/L (ref 101–111)
Glucose, Bld: 124 mg/dL — ABNORMAL HIGH (ref 65–99)
Potassium: 3.4 mmol/L — ABNORMAL LOW (ref 3.5–5.1)
Sodium: 142 mmol/L (ref 135–145)
Total Bilirubin: 0.5 mg/dL (ref 0.3–1.2)
Total Protein: 7.6 g/dL (ref 6.5–8.1)

## 2015-11-09 LAB — CBC WITH DIFFERENTIAL/PLATELET
BASOS ABS: 0 10*3/uL (ref 0.0–0.1)
BASOS PCT: 0 %
Eosinophils Absolute: 0.1 10*3/uL (ref 0.0–0.7)
Eosinophils Relative: 1 %
HEMATOCRIT: 44.3 % (ref 36.0–46.0)
HEMOGLOBIN: 14.5 g/dL (ref 12.0–15.0)
LYMPHS PCT: 17 %
Lymphs Abs: 2 10*3/uL (ref 0.7–4.0)
MCH: 27.8 pg (ref 26.0–34.0)
MCHC: 32.7 g/dL (ref 30.0–36.0)
MCV: 85 fL (ref 78.0–100.0)
Monocytes Absolute: 0.8 10*3/uL (ref 0.1–1.0)
Monocytes Relative: 7 %
NEUTROS ABS: 8.5 10*3/uL — AB (ref 1.7–7.7)
NEUTROS PCT: 75 %
Platelets: 156 10*3/uL (ref 150–400)
RBC: 5.21 MIL/uL — AB (ref 3.87–5.11)
RDW: 15 % (ref 11.5–15.5)
WBC: 11.5 10*3/uL — ABNORMAL HIGH (ref 4.0–10.5)

## 2015-11-09 LAB — URINALYSIS, ROUTINE W REFLEX MICROSCOPIC
BILIRUBIN URINE: NEGATIVE
Glucose, UA: NEGATIVE mg/dL
HGB URINE DIPSTICK: NEGATIVE
Ketones, ur: NEGATIVE mg/dL
Leukocytes, UA: NEGATIVE
Nitrite: NEGATIVE
Protein, ur: NEGATIVE mg/dL
SPECIFIC GRAVITY, URINE: 1.02 (ref 1.005–1.030)
pH: 5.5 (ref 5.0–8.0)

## 2015-11-09 LAB — C DIFFICILE QUICK SCREEN W PCR REFLEX
C DIFFICLE (CDIFF) ANTIGEN: NEGATIVE
C Diff interpretation: NEGATIVE
C Diff toxin: NEGATIVE

## 2015-11-09 LAB — LIPASE, BLOOD: Lipase: 31 U/L (ref 11–51)

## 2015-11-09 MED ORDER — POTASSIUM CHLORIDE 10 MEQ/100ML IV SOLN
10.0000 meq | INTRAVENOUS | Status: AC
  Administered 2015-11-09: 10 meq via INTRAVENOUS
  Filled 2015-11-09 (×2): qty 100

## 2015-11-09 MED ORDER — ACETAMINOPHEN 325 MG PO TABS
650.0000 mg | ORAL_TABLET | Freq: Four times a day (QID) | ORAL | Status: DC | PRN
  Administered 2015-11-09: 650 mg via ORAL
  Filled 2015-11-09: qty 2

## 2015-11-09 MED ORDER — ACETAMINOPHEN 650 MG RE SUPP: 650.0000 mg | Freq: Four times a day (QID) | RECTAL | Status: DC | PRN

## 2015-11-09 MED ORDER — LOPERAMIDE HCL 2 MG PO CAPS
4.0000 mg | ORAL_CAPSULE | Freq: Once | ORAL | Status: AC
  Administered 2015-11-09: 4 mg via ORAL
  Filled 2015-11-09: qty 2

## 2015-11-09 MED ORDER — SODIUM CHLORIDE 0.9 % IV SOLN
INTRAVENOUS | Status: AC
  Administered 2015-11-09: 10:00:00 via INTRAVENOUS

## 2015-11-09 MED ORDER — POTASSIUM CHLORIDE IN NACL 20-0.9 MEQ/L-% IV SOLN
INTRAVENOUS | Status: DC
  Administered 2015-11-09 – 2015-11-10 (×3): via INTRAVENOUS

## 2015-11-09 MED ORDER — PROMETHAZINE HCL 25 MG PO TABS: 25.0000 mg | ORAL_TABLET | Freq: Four times a day (QID) | ORAL | Status: DC | PRN

## 2015-11-09 MED ORDER — PROMETHAZINE HCL 25 MG/ML IJ SOLN
25.0000 mg | Freq: Once | INTRAMUSCULAR | Status: AC
  Administered 2015-11-09: 25 mg via INTRAVENOUS
  Filled 2015-11-09: qty 1

## 2015-11-09 MED ORDER — PROMETHAZINE HCL 25 MG/ML IJ SOLN
25.0000 mg | Freq: Four times a day (QID) | INTRAMUSCULAR | Status: DC | PRN
  Administered 2015-11-09 – 2015-11-10 (×2): 25 mg via INTRAVENOUS
  Filled 2015-11-09 (×2): qty 1

## 2015-11-09 MED ORDER — ONDANSETRON 4 MG PO TBDP: 4.0000 mg | ORAL_TABLET | Freq: Three times a day (TID) | ORAL | Status: DC | PRN

## 2015-11-09 MED ORDER — SODIUM CHLORIDE 0.9 % IV BOLUS (SEPSIS)
1000.0000 mL | Freq: Once | INTRAVENOUS | Status: AC
  Administered 2015-11-09: 1000 mL via INTRAVENOUS

## 2015-11-09 MED ORDER — LEVOTHYROXINE SODIUM 100 MCG PO TABS
100.0000 ug | ORAL_TABLET | Freq: Every day | ORAL | Status: DC
  Administered 2015-11-10: 100 ug via ORAL
  Filled 2015-11-09: qty 1

## 2015-11-09 MED ORDER — SODIUM CHLORIDE 0.9 % IV SOLN
INTRAVENOUS | Status: DC
  Administered 2015-11-09: 07:00:00 via INTRAVENOUS

## 2015-11-09 MED ORDER — DIPHENHYDRAMINE HCL 50 MG/ML IJ SOLN
25.0000 mg | Freq: Once | INTRAMUSCULAR | Status: AC
  Administered 2015-11-09: 25 mg via INTRAVENOUS
  Filled 2015-11-09: qty 1

## 2015-11-09 MED ORDER — PROMETHAZINE HCL 25 MG/ML IJ SOLN: 25.0000 mg | INTRAMUSCULAR | Status: DC | PRN

## 2015-11-09 MED ORDER — ONDANSETRON HCL 4 MG/2ML IJ SOLN: 4.0000 mg | Freq: Four times a day (QID) | INTRAMUSCULAR | Status: DC | PRN

## 2015-11-09 MED ORDER — POTASSIUM CHLORIDE CRYS ER 20 MEQ PO TBCR
40.0000 meq | EXTENDED_RELEASE_TABLET | Freq: Once | ORAL | Status: AC
  Administered 2015-11-09: 40 meq via ORAL
  Filled 2015-11-09: qty 2

## 2015-11-09 MED ORDER — ENOXAPARIN SODIUM 60 MG/0.6ML ~~LOC~~ SOLN
60.0000 mg | SUBCUTANEOUS | Status: DC
  Administered 2015-11-09: 60 mg via SUBCUTANEOUS
  Filled 2015-11-09 (×2): qty 0.6

## 2015-11-09 MED ORDER — FLUOXETINE HCL 20 MG PO CAPS
20.0000 mg | ORAL_CAPSULE | Freq: Every day | ORAL | Status: DC
  Administered 2015-11-09: 20 mg via ORAL
  Filled 2015-11-09: qty 1

## 2015-11-09 MED ORDER — METOCLOPRAMIDE HCL 5 MG/ML IJ SOLN
10.0000 mg | Freq: Once | INTRAMUSCULAR | Status: AC
  Administered 2015-11-09: 10 mg via INTRAVENOUS
  Filled 2015-11-09: qty 2

## 2015-11-09 MED ORDER — LOPERAMIDE HCL 2 MG PO CAPS: 2.0000 mg | ORAL_CAPSULE | Freq: Four times a day (QID) | ORAL | Status: DC | PRN

## 2015-11-09 MED ORDER — ONDANSETRON HCL 4 MG/2ML IJ SOLN
4.0000 mg | Freq: Once | INTRAMUSCULAR | Status: AC
  Administered 2015-11-09: 4 mg via INTRAVENOUS
  Filled 2015-11-09: qty 2

## 2015-11-09 MED ORDER — DICYCLOMINE HCL 10 MG/ML IM SOLN
20.0000 mg | Freq: Once | INTRAMUSCULAR | Status: AC
  Administered 2015-11-09: 20 mg via INTRAMUSCULAR
  Filled 2015-11-09: qty 2

## 2015-11-09 NOTE — Discharge Instructions (Signed)

## 2015-11-09 NOTE — ED Notes (Signed)
Pt states she awoke with vomiting approx 3 am.  Pt states she had gastric sleeve surgery in early January.  Pt also admits to some diarrhea but denies abd pain

## 2015-11-09 NOTE — H&P (Signed)
[facility] History and Physical  Kristina [name] [id number] DOB: 17-Oct-1970 DOA: 11/09/2015  Referring physician: Pryor Curia  PCP: Doree Albee, MD   Chief Complaint: Nausea, vomiting, diarrhea  HPI: Kristina [name] is a 45 y.o. female HTN, HLD, hypothyroidism, diabetes who recently had a gastric sleeve done by Dr. Redmond Pulling on 09/19/2015 who presents the emergency department with nausea, vomiting and diarrhea that started at 3 AM. She reports 2/22 the cramping, nausea, and 2 episodes of loose stools onset. She woke up at 3am with increased cramping, vomiting, mild gas (pressure) and loose stool. She took Zofran with no relief in nausea and vomiting. She reports sick contact (granddaughter) earlier this week, and having a mild cough, and dry mouth. She denied chills and fever. She reports after the surgery she was doing well and advancing diet with no issues. Her six week appointment s/p gastric sleeve 2/16. Hospitalist  requested to admit patient for intractable vomiting. She will be admitted for further evaluation and monitoring.   Review of Systems:  Constitutional:  No weight loss, night sweats, Fevers, chills, .  Positive for fatigue HEENT:  No headaches, Difficulty swallowing,Tooth/dental problems,Sore throat,  No sneezing, itching, ear ache, nasal congestion, post nasal drip,  Cardio-vascular:  No chest pain, Orthopnea, PND, swelling in lower extremities, anasarca, dizziness, palpitations  GI:  No heartburn, indigestion, abdominal pain, change in bowel habits, loss of appetite  Positive for: nausea, vomiting, diarrhea, Resp:  No shortness of breath with exertion or at rest. No excess mucus, no productive cough, No non-productive cough, No coughing up of blood.No change in color of mucus.No wheezing.No chest wall deformity  Skin:  no rash or lesions.  GU:  no dysuria, change in color of urine, no urgency or frequency. No flank pain.  Musculoskeletal:  No joint  pain or swelling. No decreased range of motion. No back pain.  Psych:  No change in mood or affect. No depression or anxiety. No memory loss.   Past Medical History  Diagnosis Date  . Hypertension   . Asthma   . Restless leg   . High cholesterol   . Bulging disc   . Hypothyroidism   . Obstructive sleep apnea     uses c-pap at night  . Obesity   . Vitamin D deficiency   . PONV (postoperative nausea and vomiting)   . Diabetes mellitus     diet control only  . Lumbosacral spondylosis without myelopathy     history bulging disc-chronic pain(level 3 to 8).  . Depression   . Anxiety   . Hx of adenomatous and sessile serrated colonic polyps   . Diverticulosis   . Normal cardiac stress test 02/2015    with normal EF  . Palpitations   . Chronic cough   . Allergic rhinitis   . Dyspnea     chronic  . Chronic back pain   . Vocal cord dysfunction   . Family history of adverse reaction to anesthesia     pts daughter had reaction to profolol - difficulty with breathing   . History of bronchitis   . Pneumonia     last episode 2012  . GERD (gastroesophageal reflux disease)   . Cancer (HCC)     cervical cancer   . Numbness     feet bilat comes and goes    Past Surgical History  Procedure Laterality Date  . Abdominal hysterectomy  1996  . Cholecystectomy  1994  . Nose surgery  2004  .  Esophagogastroduodenoscopy N/A 10/27/2013    Procedure: ESOPHAGOGASTRODUODENOSCOPY (EGD);  Surgeon: [name], MD;  Location: [facility];  Service: Endoscopy;  Laterality: N/A;  . Knee arthroscopy Right   . Colonoscopy with propofol N/A 03/10/2015    Procedure: COLONOSCOPY WITH PROPOFOL;  Surgeon: [name], MD;  Location: [facility];  Service: Endoscopy;  Laterality: N/A;  . Tubal ligation    . Ovaries removed    . Upper gi endoscopy  09/19/2015    Procedure: UPPER GI ENDOSCOPY;  Surgeon: [name], MD;  Location: [facility]S;  Service: General;;  . Laparoscopic gastric sleeve resection  with hiatal hernia repair N/A 09/19/2015    Procedure: LAPAROSCOPIC GASTRIC SLEEVE RESECTION WITH HIATAL HERNIA REPAIR;  Surgeon: [name], MD;  Location: [facility]S;  Service: General;  Laterality: N/A;   Social History:  reports that she has never smoked. She has never used smokeless tobacco. She reports that she does not drink alcohol or use illicit drugs.  Allergies  Allergen Reactions  . Ampicillin Anaphylaxis    Has patient had a PCN reaction causing immediate rash, facial/tongue/throat swelling, SOB or lightheadedness with hypotension: Yes Has patient had a PCN reaction causing severe rash involving mucus membranes or skin necrosis: Yes Has patient had a PCN reaction that required hospitalization Yes Has patient had a PCN reaction occurring within the last 10 years: No If all of the above answers are "NO", then may proceed with Cephalosporin use.   . Codeine Hives and Shortness Of Breath  . Sulfonamide Derivatives Itching and Rash    Family History  Problem Relation Age of Onset  . Emphysema Mother   . Allergies Mother   . Allergies Child   . Heart disease Father     deceased at age 83 from heart attack  . Heart disease Mother   . Asthma Son   . Asthma Brother   . Asthma Brother   . Asthma Mother   . Ovarian cancer Mother   . Lung cancer Maternal Aunt   . Hypertension Mother   . Stroke Maternal Grandmother   . Heart disease Maternal Grandmother   . Cancer Maternal Grandfather   . Heart disease Paternal Grandmother   . Multiple sclerosis Paternal Grandmother   . Heart attack Paternal Grandfather   . Thyroid disease Brother     died at 69  . Heart attack Brother     died at 42  . COPD Brother   . Hypertension Brother   . COPD Sister     died at 57  . Sudden death Sister   . Hyperthyroidism Sister   . Hypertension Daughter   . Asthma Son     Prior to Admission medications   Medication Sig Start Date End Date Taking? Authorizing Provider  albuterol (PROVENTIL  HFA;VENTOLIN HFA) 108 (90 BASE) MCG/ACT inhaler Inhale 2 puffs into the lungs every 6 (six) hours as needed for wheezing or shortness of breath.   Yes Historical Provider, MD  Biotin 1000 MCG tablet Take 1,000 mcg by mouth daily.   Yes Historical Provider, MD  CALCIUM CITRATE PO Take 3 tablets by mouth daily.   Yes Historical Provider, MD  FLUoxetine (PROZAC) 20 MG capsule Take 20 mg by mouth at bedtime.   Yes Historical Provider, MD  fluticasone (FLONASE) 50 MCG/ACT nasal spray Place 2 sprays into both nostrils 2 (two) times daily. Only takes in the Spring.   Yes Historical Provider, MD  levothyroxine (SYNTHROID, LEVOTHROID) 100 MCG tablet Take 100  mcg by mouth daily before breakfast.   Yes Historical Provider, MD  Multiple Vitamin (MULTIVITAMIN WITH MINERALS) TABS tablet Take 1 tablet by mouth 2 (two) times daily.    Yes Historical Provider, MD  omeprazole (PRILOSEC) 20 MG capsule Take 20 mg by mouth daily.   Yes Historical Provider, MD  ondansetron (ZOFRAN) 4 MG tablet Take 4 mg by mouth every 8 (eight) hours as needed for nausea or vomiting.   Yes Historical Provider, MD  loperamide (IMODIUM) 2 MG capsule Take 1 capsule (2 mg total) by mouth 4 (four) times daily as needed for diarrhea or loose stools. 11/09/15   Kristen N Ward, DO  ondansetron (ZOFRAN ODT) 4 MG disintegrating tablet Take 1 tablet (4 mg total) by mouth every 8 (eight) hours as needed for nausea or vomiting. 11/09/15   Delice Bison Ward, DO  promethazine (PHENERGAN) 25 MG tablet Take 1 tablet (25 mg total) by mouth every 6 (six) hours as needed for nausea or vomiting. 11/09/15   Delice Bison Ward, DO   Physical Exam: Filed Vitals:   11/09/15 0449 11/09/15 0720 11/09/15 0815 11/09/15 0855  BP: 121/74 100/60 102/62 135/63  Pulse: 68 80 78 80  Temp: 97.6 F (36.4 C) 97.5 F (36.4 C) 97.4 F (36.3 C) 97.9 F (36.6 C)  TempSrc: Oral Oral  Oral  Resp: 22 18 18 20   Height: 5\' 4"  (1.626 m)   5\' 3"  (1.6 m)  Weight: 124.286 kg (274 lb)    124.286 kg (274 lb)  SpO2: 94% 96% 96% 96%    Wt Readings from Last 3 Encounters:  11/09/15 124.286 kg (274 lb)  10/03/15 133.811 kg (295 lb)  09/20/15 140.3 kg (309 lb 4.9 oz)    General:  Appears calm and comfortable Eyes: PERRL, normal lids, irises & conjunctiva ENT: grossly normal hearing, lips & tongue Neck: no LAD, masses or thyromegaly Cardiovascular: RRR, no m/r/g. No LE edema.  Respiratory: CTA bilaterally, no w/r/r. Normal respiratory effort. Abdomen: soft, ntnd Skin: no rash or induration seen on limited exam Musculoskeletal: grossly normal tone BUE/BLE Psychiatric: grossly normal mood and affect, speech fluent and appropriate Neurologic: grossly non-focal.          Labs on Admission:  Basic Metabolic Panel:  Recent Labs Lab 11/09/15 0515  NA 142  K 3.4*  CL 108  CO2 22  GLUCOSE 124*  BUN 15  CREATININE 0.72  CALCIUM 9.3   Liver Function Tests:  Recent Labs Lab 11/09/15 0515  AST 28  ALT 37  ALKPHOS 102  BILITOT 0.5  PROT 7.6  ALBUMIN 4.4    Recent Labs Lab 11/09/15 0515  LIPASE 31   CBC:  Recent Labs Lab 11/09/15 0515  WBC 11.5*  NEUTROABS 8.5*  HGB 14.5  HCT 44.3  MCV 85.0  PLT 156    Assessment/Plan Active Problems:   Nausea vomiting and diarrhea   1. Nausea, vomiting, and diarrhea, possibly secondary to gastroenteritis. Hemodynically stable. Started on IVF and antiemetics on admission. Will check stool for C. Diff. Keep on clear liquids and advance diet as tolerated. Phenergan as needed. Will check acute abdominal series to rule out any abdominal obstruction, since the patient had abdominal surgeries.  2. Dehydration, continue fluids.  3. Hypokalemia. Will replace.  4. Hypothyroidism, continue synthroid.  5. GERD, Continue PPI    Code Status: Full DVT Prophylaxis:SCDs  Family Communication: Discussed with patient who understands and has no concerns at this time. Disposition Plan: Admit to medical bed. Anticipate  discharge in 24-48 hours.   Time spent: 55 minutes   Kathie Dike, MD  [facility] Pager [phone]   By signing my name below, I, Rennis Harding, attest that this documentation has been prepared under the direction and in the presence of Kathie Dike, MD. Electronically signed: Rennis Harding, Scribe. 11/09/2015 11:20am   I, Dr. Kathie Dike, personally performed the services described in this documentaiton. All medical record entries made by the scribe were at my direction and in my presence. I have reviewed the chart and agree that the record reflects my personal performance and is accurate and complete  Kathie Dike, MD, 11/09/2015 12:12 PM

## 2015-11-09 NOTE — ED Provider Notes (Signed)
TIME SEEN: 4:50 AM  CHIEF COMPLAINT: Nausea, vomiting and diarrhea  HPI: Pt is a 45 y.o. female with history of hypertension, hyperlipidemia, hypothyroidism, diabetes, obesity who recently had a gastric sleeve done by Dr. Redmond Pulling on 09/19/2015 who presents the emergency department with nausea, vomiting and diarrhea that started at 3 AM. States she has had 4 episodes of nonbloody, nonbilious vomiting in 5-6 episodes of liquidy, non-bloody diarrhea. States that her granddaughter had similar symptoms that she thinks she has a "stomach virus". She is status post cholecystectomy in 1994. Last ate dinner, a sub, at 10 PM. She has taken a total of 8 mg of ODT Zofran at home without relief between 2:30 and 4:30 AM (2 separate doses) without relief. Complains of diffuse abdominal cramping and feeling lightheaded. States when she stands up she feels like she is going to pass out. States she feels very weak and "lethargic". Denies any other abdominal pain. Denies any fever. No recent travel. Denies dysuria, hematuria, vaginal bleeding or discharge. She reports her last menstrual period was years ago.  ROS: See HPI Constitutional: no fever  Eyes: no drainage  ENT: no runny nose   Cardiovascular:  no chest pain  Resp: no SOB  GI: vomiting GU: no dysuria Integumentary: no rash  Allergy: no hives  Musculoskeletal: no leg swelling  Neurological: no slurred speech ROS otherwise negative  PAST MEDICAL HISTORY/PAST SURGICAL HISTORY:  Past Medical History  Diagnosis Date  . Hypertension   . Asthma   . Restless leg   . High cholesterol   . Bulging disc   . Hypothyroidism   . Obstructive sleep apnea     uses c-pap at night  . Obesity   . Vitamin D deficiency   . PONV (postoperative nausea and vomiting)   . Diabetes mellitus     diet control only  . Lumbosacral spondylosis without myelopathy     history bulging disc-chronic pain(level 3 to 8).  . Depression   . Anxiety   . Hx of adenomatous and  sessile serrated colonic polyps   . Diverticulosis   . Normal cardiac stress test 02/2015    with normal EF  . Palpitations   . Chronic cough   . Allergic rhinitis   . Dyspnea     chronic  . Chronic back pain   . Vocal cord dysfunction   . Family history of adverse reaction to anesthesia     pts daughter had reaction to profolol - difficulty with breathing   . History of bronchitis   . Pneumonia     last episode 2012  . GERD (gastroesophageal reflux disease)   . Cancer (HCC)     cervical cancer   . Numbness     feet bilat comes and goes     MEDICATIONS:  Prior to Admission medications   Medication Sig Start Date End Date Taking? Authorizing Provider  albuterol (PROVENTIL HFA;VENTOLIN HFA) 108 (90 BASE) MCG/ACT inhaler Inhale 2 puffs into the lungs every 6 (six) hours as needed for wheezing or shortness of breath.   Yes Historical Provider, MD  Biotin 1000 MCG tablet Take 1,000 mcg by mouth daily.   Yes Historical Provider, MD  CALCIUM CITRATE PO Take 3 tablets by mouth daily.   Yes Historical Provider, MD  FLUoxetine (PROZAC) 20 MG capsule Take 20 mg by mouth at bedtime.   Yes Historical Provider, MD  fluticasone (FLONASE) 50 MCG/ACT nasal spray Place 2 sprays into both nostrils 2 (two) times daily. Only  takes in the Spring.   Yes Historical Provider, MD  levothyroxine (SYNTHROID, LEVOTHROID) 100 MCG tablet Take 100 mcg by mouth daily before breakfast.   Yes Historical Provider, MD  Multiple Vitamin (MULTIVITAMIN WITH MINERALS) TABS tablet Take 1 tablet by mouth daily.   Yes Historical Provider, MD  omeprazole (PRILOSEC) 20 MG capsule Take 20 mg by mouth daily.   Yes Historical Provider, MD  ondansetron (ZOFRAN) 4 MG tablet Take 4 mg by mouth every 8 (eight) hours as needed for nausea or vomiting.   Yes Historical Provider, MD  cholecalciferol (VITAMIN D) 1000 UNITS tablet Take 5,000 Units by mouth daily.    Historical Provider, MD  diazepam (VALIUM) 5 MG tablet Take 5 mg by mouth  2 (two) times daily as needed for anxiety or muscle spasms.    Historical Provider, MD  oxyCODONE (ROXICODONE) 5 MG/5ML solution Take 5-10 mLs (5-10 mg total) by mouth every 4 (four) hours as needed for moderate pain or severe pain. 09/21/15   [name], MD  Wheat Dextrin (BENEFIBER DRINK MIX) PACK Take 1 packet by mouth daily.    Historical Provider, MD    ALLERGIES:  Allergies  Allergen Reactions  . Ampicillin Anaphylaxis    Has patient had a PCN reaction causing immediate rash, facial/tongue/throat swelling, SOB or lightheadedness with hypotension: Yes Has patient had a PCN reaction causing severe rash involving mucus membranes or skin necrosis: Yes Has patient had a PCN reaction that required hospitalization Yes Has patient had a PCN reaction occurring within the last 10 years: No If all of the above answers are "NO", then may proceed with Cephalosporin use.   . Codeine Hives and Shortness Of Breath  . Sulfonamide Derivatives Itching and Rash    SOCIAL HISTORY:  Social History  Substance Use Topics  . Smoking status: Never Smoker   . Smokeless tobacco: Never Used  . Alcohol Use: No    FAMILY HISTORY: Family History  Problem Relation Age of Onset  . Emphysema Mother   . Allergies Mother   . Allergies Child   . Heart disease Father     deceased at age 23 from heart attack  . Heart disease Mother   . Asthma Son   . Asthma Brother   . Asthma Brother   . Asthma Mother   . Ovarian cancer Mother   . Lung cancer Maternal Aunt   . Hypertension Mother   . Stroke Maternal Grandmother   . Heart disease Maternal Grandmother   . Cancer Maternal Grandfather   . Heart disease Paternal Grandmother   . Multiple sclerosis Paternal Grandmother   . Heart attack Paternal Grandfather   . Thyroid disease Brother     died at 49  . Heart attack Brother     died at 41  . COPD Brother   . Hypertension Brother   . COPD Sister     died at 67  . Sudden death Sister   . Hyperthyroidism  Sister   . Hypertension Daughter   . Asthma Son     EXAM: BP 121/74 mmHg  Pulse 68  Temp(Src) 97.6 F (36.4 C) (Oral)  Resp 22  Ht 5\' 4"  (1.626 m)  Wt 274 lb (124.286 kg)  BMI 47.01 kg/m2  SpO2 94% CONSTITUTIONAL: Alert and oriented and responds appropriately to questions. Appears uncomfortable but is afebrile nontoxic, obese HEAD: Normocephalic EYES: Conjunctivae clear, PERRL ENT: normal nose; no rhinorrhea; dry mucous membranes NECK: Supple, no meningismus, no LAD  CARD: RRR;  S1 and S2 appreciated; no murmurs, no clicks, no rubs, no gallops RESP: Normal chest excursion without splinting or tachypnea; breath sounds clear and equal bilaterally; no wheezes, no rhonchi, no rales, no hypoxia or respiratory distress, speaking full sentences ABD/GI: Normal bowel sounds; non-distended; soft, non-tender, no rebound, no guarding, no peritoneal signs, no tenderness at McBurney's point BACK:  The back appears normal and is non-tender to palpation, there is no CVA tenderness EXT: Normal ROM in all joints; non-tender to palpation; no edema; normal capillary refill; no cyanosis, no calf tenderness or swelling    SKIN: Normal color for age and race; warm; no rash NEURO: Moves all extremities equally, sensation to light touch intact diffusely, cranial nerves II through XII intact PSYCH: The patient's mood and manner are appropriate. Grooming and personal hygiene are appropriate.  MEDICAL DECISION MAKING: Patient here with nausea, vomiting and diarrhea with benign abdominal exam, normal vital signs he was afebrile and nontoxic. I agree that this is likely viral gastroenteritis. Her labs are unremarkable other than mild leukocytosis and potassium of 3.4. We'll give replacement potassium, treat her symptoms with Phenergan, IV fluids, Imodium and Bentyl.  ED PROGRESS: Patient still vomiting after Phenergan. Will give dose of Reglan.  6:15 AM  Pt's repeat abdominal exam is still benign. She reports  feeling better but now feels restless. Suspect this may be from Reglan. We'll give dose of IV Benadryl. States she is still feeling nauseous. We'll give second dose of Phenergan. She is drinking small sips of water. She still has not urinated.   6:50 AM  Pt still vomiting and having diarrhea despite a total of 8 mg of Zofran, 50 mg of Phenergan and 10 mg of Reglan. I feel she will need admission. Her PCP is Dr. Anastasio Champion. She is comfortable with this plan. Her abdominal exam is still completely benign. We'll send stool cultures.   7:20 AM  D/w Dr. Roderic Palau with hospitalist service for admission to medical bed, observation for intractable vomiting. Patient is hemodynamically stable. Patient agrees with this plan. We are hydrating patient. Urine shows no sign of infection, no ketones. Stool cultures pending. I will place holding orders per hospitalist request. Care transferred to hospitalist service.  Blair, DO 11/09/15 [phone]

## 2015-11-10 DIAGNOSIS — K219 Gastro-esophageal reflux disease without esophagitis: Secondary | ICD-10-CM | POA: Diagnosis not present

## 2015-11-10 DIAGNOSIS — R112 Nausea with vomiting, unspecified: Secondary | ICD-10-CM | POA: Diagnosis not present

## 2015-11-10 DIAGNOSIS — E039 Hypothyroidism, unspecified: Secondary | ICD-10-CM | POA: Diagnosis not present

## 2015-11-10 DIAGNOSIS — E860 Dehydration: Secondary | ICD-10-CM | POA: Diagnosis not present

## 2015-11-10 DIAGNOSIS — K529 Noninfective gastroenteritis and colitis, unspecified: Secondary | ICD-10-CM | POA: Diagnosis not present

## 2015-11-10 LAB — BASIC METABOLIC PANEL
ANION GAP: 6 (ref 5–15)
BUN: 5 mg/dL — ABNORMAL LOW (ref 6–20)
CALCIUM: 8.3 mg/dL — AB (ref 8.9–10.3)
CO2: 25 mmol/L (ref 22–32)
CREATININE: 0.62 mg/dL (ref 0.44–1.00)
Chloride: 112 mmol/L — ABNORMAL HIGH (ref 101–111)
Glucose, Bld: 84 mg/dL (ref 65–99)
Potassium: 3.9 mmol/L (ref 3.5–5.1)
SODIUM: 143 mmol/L (ref 135–145)

## 2015-11-10 LAB — GASTROINTESTINAL PANEL BY PCR, STOOL (REPLACES STOOL CULTURE)
ASTROVIRUS: NOT DETECTED
Adenovirus F40/41: NOT DETECTED
CAMPYLOBACTER SPECIES: NOT DETECTED
Cryptosporidium: NOT DETECTED
Cyclospora cayetanensis: NOT DETECTED
E. COLI O157: NOT DETECTED
ENTAMOEBA HISTOLYTICA: NOT DETECTED
ENTEROAGGREGATIVE E COLI (EAEC): NOT DETECTED
ENTEROTOXIGENIC E COLI (ETEC): NOT DETECTED
Enteropathogenic E coli (EPEC): NOT DETECTED
GIARDIA LAMBLIA: NOT DETECTED
NOROVIRUS GI/GII: DETECTED — AB
PLESIMONAS SHIGELLOIDES: NOT DETECTED
Rotavirus A: NOT DETECTED
Salmonella species: NOT DETECTED
Sapovirus (I, II, IV, and V): NOT DETECTED
Shiga like toxin producing E coli (STEC): NOT DETECTED
Shigella/Enteroinvasive E coli (EIEC): NOT DETECTED
VIBRIO CHOLERAE: NOT DETECTED
Vibrio species: NOT DETECTED
Yersinia enterocolitica: NOT DETECTED

## 2015-11-10 LAB — CBC
HCT: 39 % (ref 36.0–46.0)
HEMOGLOBIN: 12.2 g/dL (ref 12.0–15.0)
MCH: 27.4 pg (ref 26.0–34.0)
MCHC: 31.3 g/dL (ref 30.0–36.0)
MCV: 87.4 fL (ref 78.0–100.0)
PLATELETS: 121 10*3/uL — AB (ref 150–400)
RBC: 4.46 MIL/uL (ref 3.87–5.11)
RDW: 15.3 % (ref 11.5–15.5)
WBC: 3.8 10*3/uL — AB (ref 4.0–10.5)

## 2015-11-10 MED ORDER — LOPERAMIDE HCL 2 MG PO CAPS: 2.0000 mg | ORAL_CAPSULE | Freq: Four times a day (QID) | ORAL | Status: DC | PRN

## 2015-11-10 MED ORDER — ONDANSETRON 4 MG PO TBDP: 4.0000 mg | ORAL_TABLET | Freq: Three times a day (TID) | ORAL | Status: DC | PRN

## 2015-11-10 MED ORDER — PANTOPRAZOLE SODIUM 40 MG PO TBEC
40.0000 mg | DELAYED_RELEASE_TABLET | Freq: Every day | ORAL | Status: DC
  Administered 2015-11-10: 40 mg via ORAL
  Filled 2015-11-10: qty 1

## 2015-11-10 NOTE — Discharge Summary (Signed)
Physician Discharge Summary  Kristina [name] [id number] DOB: 08/31/1971 DOA: 11/09/2015  PCP: Doree Albee, MD  Admit date: 11/09/2015 Discharge date: 11/10/2015  Time spent:35 minutes  Recommendations for Outpatient Follow-up:  1. Follow-up with PCP within 1-2 weeks for resolution of gastroenteritis    Discharge Diagnoses:  Active Problems:   GERD   Adult hypothyroidism   Nausea vomiting and diarrhea   Dehydration   Hypokalemia   Gastroenteritis   Discharge Condition: Improved   Diet recommendation: High protein, low-carb, no sugar   Filed Weights   11/09/15 0449 11/09/15 0855  Weight: 124.286 kg (274 lb) 124.286 kg (274 lb)    History of present illness:  45 y.o. female HTN, HLD, hypothyroidism, diabetes who recently had a gastric sleeve done by Dr. Redmond Pulling on 09/19/2015 who presents the emergency department with nausea, vomiting and diarrhea that started at 3 AM. She reports 2/22 the cramping, nausea, and 2 episodes of loose stools onset. She woke up at 3am with increased cramping, vomiting, mild gas (pressure) and loose stool. She took Zofran with no relief in nausea and vomiting. She reports sick contact (granddaughter) earlier this week, and having a mild cough, and dry mouth. She denied chills and fever. She reports after the surgery she was doing well and advancing diet with no issues. Her six week appointment s/p gastric sleeve 2/16. Hospitalist requested to admit patient for intractable vomiting. She was admitted for further evaluation and monitoring  Hospital Course:  Patient was admitted for nausea, vomiting, and diarrhea that was likely secondary to gastroenteritis. She was started on IVF and antiemetics on admission, with improvement in symptoms. Her stool was negative for C. Diff. UA and Abd X-ray both unremarkable. On discharge she reports no recurrence of vomiting and nausea. She was able to tolerate a diet and is stable for discharge.  1. Dehydration,  resolved with IVF.  2. Hypokalemia. Replaced.  3. Hypothyroidism, continue synthroid.  4. GERD, Continue PPI  Procedures:  None  Consultations:  None   Discharge Exam: Filed Vitals:   11/09/15 2141 11/10/15 0627  BP: 116/45 120/60  Pulse: 80 71  Temp: 99.1 F (37.3 C) 98.2 F (36.8 C)  Resp: 20 20   General: NAD Cardiovascular: RRR, S1, S2  Respiratory:CTAB, No wheezing, rales or rhonchi. Normal respiratory effort Abdomen: soft, non tender, no distention , bowel sounds normal Musculoskeletal: No edema b/l  Discharge Instructions    Current Discharge Medication List    START taking these medications   Details  loperamide (IMODIUM) 2 MG capsule Take 1 capsule (2 mg total) by mouth 4 (four) times daily as needed for diarrhea or loose stools. Qty: 15 capsule, Refills: 0    ondansetron (ZOFRAN ODT) 4 MG disintegrating tablet Take 1 tablet (4 mg total) by mouth every 8 (eight) hours as needed for nausea or vomiting. Qty: 20 tablet, Refills: 0    promethazine (PHENERGAN) 25 MG tablet Take 1 tablet (25 mg total) by mouth every 6 (six) hours as needed for nausea or vomiting. Qty: 15 tablet, Refills: 0      CONTINUE these medications which have NOT CHANGED   Details  albuterol (PROVENTIL HFA;VENTOLIN HFA) 108 (90 BASE) MCG/ACT inhaler Inhale 2 puffs into the lungs every 6 (six) hours as needed for wheezing or shortness of breath.    Biotin 1000 MCG tablet Take 1,000 mcg by mouth daily.    CALCIUM CITRATE PO Take 3 tablets by mouth daily.    FLUoxetine (PROZAC) 20 MG capsule  Take 20 mg by mouth at bedtime.    fluticasone (FLONASE) 50 MCG/ACT nasal spray Place 2 sprays into both nostrils 2 (two) times daily. Only takes in the Spring.    levothyroxine (SYNTHROID, LEVOTHROID) 100 MCG tablet Take 100 mcg by mouth daily before breakfast.    Multiple Vitamin (MULTIVITAMIN WITH MINERALS) TABS tablet Take 1 tablet by mouth 2 (two) times daily.     omeprazole (PRILOSEC)  20 MG capsule Take 20 mg by mouth daily.    ondansetron (ZOFRAN) 4 MG tablet Take 4 mg by mouth every 8 (eight) hours as needed for nausea or vomiting.      STOP taking these medications     cholecalciferol (VITAMIN D) 1000 UNITS tablet      diazepam (VALIUM) 5 MG tablet      oxyCODONE (ROXICODONE) 5 MG/5ML solution      Wheat Dextrin (BENEFIBER DRINK MIX) PACK        Allergies  Allergen Reactions  . Ampicillin Anaphylaxis    Has patient had a PCN reaction causing immediate rash, facial/tongue/throat swelling, SOB or lightheadedness with hypotension: Yes Has patient had a PCN reaction causing severe rash involving mucus membranes or skin necrosis: Yes Has patient had a PCN reaction that required hospitalization Yes Has patient had a PCN reaction occurring within the last 10 years: No If all of the above answers are "NO", then may proceed with Cephalosporin use.   . Codeine Hives and Shortness Of Breath  . Sulfonamide Derivatives Itching and Rash   Follow-up Information    Follow up with Doree Albee, MD.   Specialty:  Internal Medicine   Why:  As needed   Contact information:   [address] [place] [address] [phone]        The results of significant diagnostics from this hospitalization (including imaging, microbiology, ancillary and laboratory) are listed below for reference.    Significant Diagnostic Studies: Dg Abd Acute W/chest  11/09/2015  CLINICAL DATA:  Vomiting EXAM: DG ABDOMEN ACUTE W/ 1V CHEST COMPARISON:  Chest 08/01/2015 FINDINGS: Heart size upper normal. Negative for heart failure. Lungs are clear without infiltrate effusion or mass Normal bowel gas pattern. No bowel obstruction or free air. Surgical clips in the gallbladder fossa and in the region of the stomach. No renal calculi. IMPRESSION: Negative abdominal radiographs.  No acute cardiopulmonary disease. Electronically Signed   By: Franchot Gallo M.D.   On: 11/09/2015 14:34     Microbiology: Recent Results (from the past 240 hour(s))  C difficile quick scan w PCR reflex     Status: None   Collection Time: 11/09/15  6:30 AM  Result Value Ref Range Status   C Diff antigen NEGATIVE NEGATIVE Final   C Diff toxin NEGATIVE NEGATIVE Final   C Diff interpretation Negative for toxigenic C. difficile  Final     Labs: Basic Metabolic Panel:  Recent Labs Lab 11/09/15 0515  NA 142  K 3.4*  CL 108  CO2 22  GLUCOSE 124*  BUN 15  CREATININE 0.72  CALCIUM 9.3   Liver Function Tests:  Recent Labs Lab 11/09/15 0515  AST 28  ALT 37  ALKPHOS 102  BILITOT 0.5  PROT 7.6  ALBUMIN 4.4    Recent Labs Lab 11/09/15 0515  LIPASE 31   CBC:  Recent Labs Lab 11/09/15 0515  WBC 11.5*  NEUTROABS 8.5*  HGB 14.5  HCT 44.3  MCV 85.0  PLT 156    Signed:  Memon,  Jolaine Artist, MD [facility] 11/10/2015, 7:06 AM    By signing my name below, I, Rennis Harding, attest that this documentation has been prepared under the direction and in the presence of Kathie Dike, MD. Electronically signed: Rennis Harding, Scribe. 11/10/2015 10:50am  I, Dr. Kathie Dike, personally performed the services described in this documentaiton. All medical record entries made by the scribe were at my direction and in my presence. I have reviewed the chart and agree that the record reflects my personal performance and is accurate and complete  Kathie Dike, MD, 11/10/2015 4:40 PM

## 2015-11-10 NOTE — Care Management Obs Status (Signed)
[facility] NOTIFICATION   Patient Details  Name: Kristina [name] MRN: [id number] Date of Birth: 1970/11/02   Medicare Observation Status Notification Given:  Yes    Sherald Barge, RN 11/10/2015, 8:57 AM

## 2015-11-10 NOTE — Care Management Note (Signed)
Case Management Note  Patient Details  Name: JARYA JURS MRN: [id number] Date of Birth: [birth year]  Subjective/Objective:                  Pt admitted with N/V/D. Pt is from home and is ind with ADL's. Pt has PCP and no problems affording medications. Pt plans to return home with self care today.   Action/Plan:  No CM needs.  Expected Discharge Date:      11/10/2015            Expected Discharge Plan:  Home/Self Care  In-House Referral:  NA  Discharge planning Services  CM Consult  Post Acute Care Choice:  NA Choice offered to:  NA  DME Arranged:    DME Agency:     HH Arranged:    HH Agency:     Status of Service:  Completed, signed off  Medicare Important Message Given:    Date Medicare IM Given:    Medicare IM give by:    Date Additional Medicare IM Given:    Additional Medicare Important Message give by:     If discussed at [place] of Stay Meetings, dates discussed:    Additional Comments:  Sherald Barge, RN 11/10/2015, 8:58 AM

## 2015-11-10 NOTE — Progress Notes (Signed)
AVS reviewed with patient.  Verbalized understanding of discharge instructions, physician follow-up, and medications.  Patient's IV removed.  Site WNL.  Patient reports belongings intact and in possession at time of discharge.  Patient transported by RN to main entrance for discharge.  Patient stable at time of discharge.

## 2015-11-10 NOTE — Plan of Care (Signed)
Problem: Safety: Goal: Ability to remain free from injury will improve Outcome: Progressing Assessed pt and determined pt is a low fall risk.   Problem: Pain Managment: Goal: General experience of comfort will improve Outcome: Progressing Pt reported a mild headache at the beginning of shift which she reported to be relieved by Tylenol. Pt currently asleep in bed. Will continue to monitor pt.

## 2015-11-16 DIAGNOSIS — I10 Essential (primary) hypertension: Secondary | ICD-10-CM | POA: Diagnosis not present

## 2015-11-16 DIAGNOSIS — E559 Vitamin D deficiency, unspecified: Secondary | ICD-10-CM | POA: Diagnosis not present

## 2015-11-16 DIAGNOSIS — E538 Deficiency of other specified B group vitamins: Secondary | ICD-10-CM | POA: Diagnosis not present

## 2015-11-20 ENCOUNTER — Encounter: Payer: Self-pay | Admitting: Dietician

## 2015-11-20 ENCOUNTER — Encounter: Payer: Medicare Other | Attending: General Surgery | Admitting: Dietician

## 2015-11-20 DIAGNOSIS — Z713 Dietary counseling and surveillance: Secondary | ICD-10-CM | POA: Diagnosis not present

## 2015-11-20 DIAGNOSIS — Z6841 Body Mass Index (BMI) 40.0 and over, adult: Secondary | ICD-10-CM | POA: Diagnosis not present

## 2015-11-20 NOTE — Progress Notes (Signed)
  Follow-up visit:  8 Weeks Post-Operative Sleeve Gastrectomy Surgery  Medical Nutrition Therapy:  Appt start time: [id number] end time:  1210  Primary concerns today: Post-operative Bariatric Surgery Nutrition Management.  Kristina [name] returns having lost a total of 45 pounds. She was recently admitted for a stomach virus. She states she is feeling much better and tolerating most foods. However, sugar alcohols upset stomach and cause diarrhea. Resumed protein shake recently to supplement protein needs. Eating oatmeal sometimes to keep her "regular." She understands that this is not recommended. Has added some non starchy vegetables and tolerates.   Surgery date: 09/19/2015 Surgery type: Sleeve gastrectomy Start weight at [facility]: 316 lbs on 07/22/15 Weight today: 271 lbs Weight change: 24 lbs Total weight lost: 45 lbs  TANITA  BODY COMP RESULTS  09/04/15 10/03/15 11/20/15   BMI (kg/m^2) 55.5 52.3 48   Fat Mass (lbs) 181.5 167.5 148.5   Fat Free Mass (lbs) 132 127.5 122.5   Total Body Water (lbs) 96.5 93.5 89.5    Preferred Learning Style:   No preference indicated   Learning Readiness:   Ready  24-hr recall: B (AM): oatmeal with 1/4-1/3 cup [employer] Milk OR 2 eggs with cheese and 1/2 slice toast Snk (AM): Premier protein shake (30g)  L (PM): 2 oz deli meat + 1 oz cheese Snk (PM): [place] yogurt or cheese (7-15g)  D (PM): 2-2.5 [place] burger and vegetables  Snk (PM):   Fluid intake: water with sugar free flavoring, Propel, Powerade Zero, sometimes unsweetened tea (50-64 oz) + 11 oz protein shake  Estimated total protein intake: 100 g/day per patient  Medications: only synthroid, prozac, and prilosec (no longer on diabetes, pain, or HTN medication) Supplementation: taking  CBG monitoring: not testing Last patient reported A1c: 5.8%  Using straws: yes, no issues reported Drinking while eating: no Hair loss: no, taking Biotin Carbonated beverages: none N/V/D/C: no vomiting since virus,  diarrhea with sugar alcohols Dumping syndrome: none  Recent physical activity:  4x a week cardio + strength training (plans to start swim lessons)  Progress Towards Goal(s):  In progress.  Handouts given during visit include:  Phase 3B lean protein + non starchy vegetables   Nutritional Diagnosis:  [place]-3.3 Overweight/obesity related to past poor dietary habits and physical inactivity as evidenced by patient w/ recent sleeve gastrectomy surgery following dietary guidelines for continued weight loss.    Intervention:  Nutrition counseling provided.  Teaching Method Utilized:  Visual Auditory Hands on  Barriers to learning/adherence to lifestyle change: none  Demonstrated degree of understanding via:  Teach Back   Monitoring/Evaluation:  Dietary intake, exercise, and body weight. Follow up in 2 months for 4 month post-op visit.

## 2015-11-20 NOTE — Patient Instructions (Addendum)
Goals:  Follow Phase 3B: High Protein + Non-Starchy Vegetables  Eat 3-6 small meals/snacks, every 3-5 hrs  Increase lean protein foods to meet 60g goal  Increase fluid intake to 64oz +  Avoid drinking 15 minutes before, during and 30 minutes after eating  Aim for >30 min of physical activity daily  Limit oatmeal, potatoes, frozen yogurt, and bread  Take 2 Celebrate vitamins per day, 2 Calcium citrate petites, add an iron supplement   Take all of these at least 2 hours apart  Surgery date: 09/19/2015 Surgery type: Sleeve gastrectomy Start weight at [facility]: 316 lbs on 07/22/15 Weight today: 271 lbs Weight change: 24 lbs Total weight lost: 45 lbs  TANITA  BODY COMP RESULTS  09/04/15 10/03/15 11/20/15   BMI (kg/m^2) 55.5 52.3 48   Fat Mass (lbs) 181.5 167.5 148.5   Fat Free Mass (lbs) 132 127.5 122.5   Total Body Water (lbs) 96.5 93.5 89.5

## 2015-11-21 DIAGNOSIS — Z6841 Body Mass Index (BMI) 40.0 and over, adult: Secondary | ICD-10-CM | POA: Diagnosis not present

## 2015-11-28 DIAGNOSIS — Z6841 Body Mass Index (BMI) 40.0 and over, adult: Secondary | ICD-10-CM | POA: Diagnosis not present

## 2015-12-05 DIAGNOSIS — E2839 Other primary ovarian failure: Secondary | ICD-10-CM | POA: Diagnosis not present

## 2015-12-05 DIAGNOSIS — Z6841 Body Mass Index (BMI) 40.0 and over, adult: Secondary | ICD-10-CM | POA: Diagnosis not present

## 2015-12-14 DIAGNOSIS — Z6841 Body Mass Index (BMI) 40.0 and over, adult: Secondary | ICD-10-CM | POA: Diagnosis not present

## 2015-12-20 DIAGNOSIS — J0191 Acute recurrent sinusitis, unspecified: Secondary | ICD-10-CM | POA: Diagnosis not present

## 2015-12-20 DIAGNOSIS — E039 Hypothyroidism, unspecified: Secondary | ICD-10-CM | POA: Diagnosis not present

## 2015-12-20 DIAGNOSIS — E2839 Other primary ovarian failure: Secondary | ICD-10-CM | POA: Diagnosis not present

## 2015-12-20 DIAGNOSIS — E785 Hyperlipidemia, unspecified: Secondary | ICD-10-CM | POA: Diagnosis not present

## 2016-01-03 DIAGNOSIS — E119 Type 2 diabetes mellitus without complications: Secondary | ICD-10-CM | POA: Diagnosis not present

## 2016-01-03 DIAGNOSIS — Z6841 Body Mass Index (BMI) 40.0 and over, adult: Secondary | ICD-10-CM | POA: Diagnosis not present

## 2016-01-03 DIAGNOSIS — E039 Hypothyroidism, unspecified: Secondary | ICD-10-CM | POA: Diagnosis not present

## 2016-01-11 DIAGNOSIS — E119 Type 2 diabetes mellitus without complications: Secondary | ICD-10-CM | POA: Diagnosis not present

## 2016-01-11 DIAGNOSIS — Z6841 Body Mass Index (BMI) 40.0 and over, adult: Secondary | ICD-10-CM | POA: Diagnosis not present

## 2016-01-24 ENCOUNTER — Encounter: Payer: Medicare Other | Attending: General Surgery | Admitting: Dietician

## 2016-01-24 ENCOUNTER — Encounter: Payer: Self-pay | Admitting: Dietician

## 2016-01-24 DIAGNOSIS — Z713 Dietary counseling and surveillance: Secondary | ICD-10-CM | POA: Diagnosis not present

## 2016-01-24 DIAGNOSIS — Z6841 Body Mass Index (BMI) 40.0 and over, adult: Secondary | ICD-10-CM | POA: Insufficient documentation

## 2016-01-24 NOTE — Progress Notes (Signed)
  Follow-up visit:  4 months Post-Operative Sleeve Gastrectomy Surgery  Medical Nutrition Therapy:  Appt start time: [id number] end time:  1135  Primary concerns today: Post-operative Bariatric Surgery Nutrition Management.  Kristina [name] returns having lost another 21.6 pounds. She feels like she is at a weight loss plateau but understands that this is normal. Tried clear Premier shake and likes it. Having 1 protein shake per day.   Non scale victories: laying on couch with grandchild, wearing clothes that were too small, tying shoes and putting on socks easily, yardwork, size 16, family becoming healthier  Surgery date: 09/19/2015 Surgery type: Sleeve gastrectomy Start weight at [facility]: 316 lbs on 07/22/15 Weight today: 249.4 lbs Weight change: 21.6 lbs Total weight lost: 66.6 lbs  TANITA  BODY COMP RESULTS  09/04/15 10/03/15 11/20/15 01/24/16   BMI (kg/m^2) 55.5 52.3 48 N/A   Fat Mass (lbs) 181.5 167.5 148.5    Fat Free Mass (lbs) 132 127.5 122.5    Total Body Water (lbs) 96.5 93.5 89.5     Preferred Learning Style:   No preference indicated   Learning Readiness:   Ready  24-hr recall: B (AM): high protein oatmeal with 1/4 Premier shake (17g) Snk (AM): rest of Premier shake (21g) L (PM): 3-4 oz deli meat and cheese, a few strawberries (21-28g) Snk (PM): cheese stick or grapes (0-6g) D (PM): 2-2.5 [place] burger and vegetables (14g)  Snk (PM): Balanced Break (7g)  Fluid intake: water with sugar free flavoring, Propel, Powerade Zero, sometimes unsweetened tea (50-64 oz) + 11 oz protein shake  Estimated total protein intake: 80-95 g/day  Medications: only synthroid, prozac, and prilosec (no longer on diabetes, pain, or HTN medication) Supplementation: taking  CBG monitoring: not testing Last patient reported A1c: 5.8%  Using straws: yes, no issues reported Drinking while eating: no Hair loss: no, taking Biotin Carbonated beverages: none N/V/D/C: no vomiting since virus, diarrhea with  sugar alcohols Dumping syndrome: none  Recent physical activity:  4x a week: cardio for an hour + 30 minutes of strength training  Progress Towards Goal(s):  In progress.  Handouts given during visit include:  none   Nutritional Diagnosis:  [place]-3.3 Overweight/obesity related to past poor dietary habits and physical inactivity as evidenced by patient w/ recent sleeve gastrectomy surgery following dietary guidelines for continued weight loss.    Intervention:  Nutrition counseling provided.  Teaching Method Utilized:  Visual Auditory Hands on  Barriers to learning/adherence to lifestyle change: none  Demonstrated degree of understanding via:  Teach Back   Monitoring/Evaluation:  Dietary intake, exercise, and body weight. Follow up in 2 months for 6 month post-op visit.

## 2016-01-24 NOTE — Patient Instructions (Signed)
Goals:  Follow Phase 3B: High Protein + Non-Starchy Vegetables  Eat 3-6 small meals/snacks, every 3-5 hrs  Increase lean protein foods to meet 60g goal  Increase fluid intake to 64oz +  Avoid drinking 15 minutes before, during and 30 minutes after eating  Aim for >30 min of physical activity daily  Limit oatmeal, potatoes, frozen yogurt, and bread  Take 2 Celebrate vitamins per day, 2 Calcium citrate petites, add an iron supplement   Take all of these at least 2 hours apart    Keep up the good work!!   Surgery date: 09/19/2015 Surgery type: Sleeve gastrectomy Start weight at [facility]: 316 lbs on 07/22/15 Weight today: 249.4 lbs Weight change: 21.6 lbs Total weight lost: 66.6 lbs

## 2016-01-25 DIAGNOSIS — E119 Type 2 diabetes mellitus without complications: Secondary | ICD-10-CM | POA: Diagnosis not present

## 2016-01-25 DIAGNOSIS — Z6841 Body Mass Index (BMI) 40.0 and over, adult: Secondary | ICD-10-CM | POA: Diagnosis not present

## 2016-02-01 DIAGNOSIS — E2839 Other primary ovarian failure: Secondary | ICD-10-CM | POA: Diagnosis not present

## 2016-02-01 DIAGNOSIS — E538 Deficiency of other specified B group vitamins: Secondary | ICD-10-CM | POA: Diagnosis not present

## 2016-02-02 DIAGNOSIS — E119 Type 2 diabetes mellitus without complications: Secondary | ICD-10-CM | POA: Diagnosis not present

## 2016-02-02 DIAGNOSIS — J45909 Unspecified asthma, uncomplicated: Secondary | ICD-10-CM | POA: Diagnosis not present

## 2016-02-02 DIAGNOSIS — I10 Essential (primary) hypertension: Secondary | ICD-10-CM | POA: Diagnosis not present

## 2016-02-02 DIAGNOSIS — E669 Obesity, unspecified: Secondary | ICD-10-CM | POA: Diagnosis not present

## 2016-02-02 DIAGNOSIS — Z9884 Bariatric surgery status: Secondary | ICD-10-CM | POA: Diagnosis not present

## 2016-02-02 DIAGNOSIS — G4730 Sleep apnea, unspecified: Secondary | ICD-10-CM | POA: Diagnosis not present

## 2016-02-02 DIAGNOSIS — M545 Low back pain: Secondary | ICD-10-CM | POA: Diagnosis not present

## 2016-02-05 NOTE — Progress Notes (Signed)
  TANITA  BODY COMP RESULTS  09/04/15 10/03/15 11/20/15 02/02/16   BMI (kg/m^2) 55.5 52.3 48 44   Fat Mass (lbs) 181.5 167.5 148.5 126.8   Fat Free Mass (lbs) 132 127.5 122.5 121.8   Total Body Water (lbs) 96.5 93.5 89.5 89.6

## 2016-02-08 DIAGNOSIS — E119 Type 2 diabetes mellitus without complications: Secondary | ICD-10-CM | POA: Diagnosis not present

## 2016-02-08 DIAGNOSIS — E2839 Other primary ovarian failure: Secondary | ICD-10-CM | POA: Diagnosis not present

## 2016-02-08 DIAGNOSIS — Z6841 Body Mass Index (BMI) 40.0 and over, adult: Secondary | ICD-10-CM | POA: Diagnosis not present

## 2016-02-19 DIAGNOSIS — Z6841 Body Mass Index (BMI) 40.0 and over, adult: Secondary | ICD-10-CM | POA: Diagnosis not present

## 2016-02-19 DIAGNOSIS — E2839 Other primary ovarian failure: Secondary | ICD-10-CM | POA: Diagnosis not present

## 2016-02-21 DIAGNOSIS — E785 Hyperlipidemia, unspecified: Secondary | ICD-10-CM | POA: Diagnosis not present

## 2016-02-21 DIAGNOSIS — E039 Hypothyroidism, unspecified: Secondary | ICD-10-CM | POA: Diagnosis not present

## 2016-02-21 DIAGNOSIS — E119 Type 2 diabetes mellitus without complications: Secondary | ICD-10-CM | POA: Diagnosis not present

## 2016-02-21 DIAGNOSIS — E559 Vitamin D deficiency, unspecified: Secondary | ICD-10-CM | POA: Diagnosis not present

## 2016-02-21 DIAGNOSIS — I10 Essential (primary) hypertension: Secondary | ICD-10-CM | POA: Diagnosis not present

## 2016-02-21 DIAGNOSIS — E2839 Other primary ovarian failure: Secondary | ICD-10-CM | POA: Diagnosis not present

## 2016-02-21 DIAGNOSIS — E538 Deficiency of other specified B group vitamins: Secondary | ICD-10-CM | POA: Diagnosis not present

## 2016-03-05 DIAGNOSIS — E538 Deficiency of other specified B group vitamins: Secondary | ICD-10-CM | POA: Diagnosis not present

## 2016-03-05 DIAGNOSIS — E2839 Other primary ovarian failure: Secondary | ICD-10-CM | POA: Diagnosis not present

## 2016-03-13 DIAGNOSIS — Z6841 Body Mass Index (BMI) 40.0 and over, adult: Secondary | ICD-10-CM | POA: Diagnosis not present

## 2016-03-13 DIAGNOSIS — E119 Type 2 diabetes mellitus without complications: Secondary | ICD-10-CM | POA: Diagnosis not present

## 2016-03-13 DIAGNOSIS — E2839 Other primary ovarian failure: Secondary | ICD-10-CM | POA: Diagnosis not present

## 2016-03-27 DIAGNOSIS — N959 Unspecified menopausal and perimenopausal disorder: Secondary | ICD-10-CM | POA: Diagnosis not present

## 2016-03-27 DIAGNOSIS — E2839 Other primary ovarian failure: Secondary | ICD-10-CM | POA: Diagnosis not present

## 2016-03-27 DIAGNOSIS — E039 Hypothyroidism, unspecified: Secondary | ICD-10-CM | POA: Diagnosis not present

## 2016-03-27 DIAGNOSIS — I10 Essential (primary) hypertension: Secondary | ICD-10-CM | POA: Diagnosis not present

## 2016-03-28 ENCOUNTER — Ambulatory Visit: Payer: Self-pay | Admitting: Dietician

## 2016-04-02 DIAGNOSIS — E538 Deficiency of other specified B group vitamins: Secondary | ICD-10-CM | POA: Diagnosis not present

## 2016-04-02 DIAGNOSIS — J0191 Acute recurrent sinusitis, unspecified: Secondary | ICD-10-CM | POA: Diagnosis not present

## 2016-04-02 DIAGNOSIS — Z6841 Body Mass Index (BMI) 40.0 and over, adult: Secondary | ICD-10-CM | POA: Diagnosis not present

## 2016-04-02 DIAGNOSIS — E119 Type 2 diabetes mellitus without complications: Secondary | ICD-10-CM | POA: Diagnosis not present

## 2016-04-15 DIAGNOSIS — Z6841 Body Mass Index (BMI) 40.0 and over, adult: Secondary | ICD-10-CM | POA: Diagnosis not present

## 2016-04-15 DIAGNOSIS — I10 Essential (primary) hypertension: Secondary | ICD-10-CM | POA: Diagnosis not present

## 2016-04-15 DIAGNOSIS — E119 Type 2 diabetes mellitus without complications: Secondary | ICD-10-CM | POA: Diagnosis not present

## 2016-04-19 ENCOUNTER — Encounter: Payer: Medicare Other | Attending: General Surgery | Admitting: Dietician

## 2016-04-19 ENCOUNTER — Encounter: Payer: Self-pay | Admitting: Dietician

## 2016-04-19 DIAGNOSIS — Z713 Dietary counseling and surveillance: Secondary | ICD-10-CM | POA: Insufficient documentation

## 2016-04-19 DIAGNOSIS — Z6841 Body Mass Index (BMI) 40.0 and over, adult: Secondary | ICD-10-CM | POA: Diagnosis not present

## 2016-04-19 DIAGNOSIS — K219 Gastro-esophageal reflux disease without esophagitis: Secondary | ICD-10-CM | POA: Insufficient documentation

## 2016-04-19 DIAGNOSIS — E119 Type 2 diabetes mellitus without complications: Secondary | ICD-10-CM | POA: Insufficient documentation

## 2016-04-19 DIAGNOSIS — I10 Essential (primary) hypertension: Secondary | ICD-10-CM | POA: Diagnosis not present

## 2016-04-19 DIAGNOSIS — M545 Low back pain: Secondary | ICD-10-CM | POA: Diagnosis not present

## 2016-04-19 NOTE — Progress Notes (Signed)
   Follow-up visit:  7 months Post-Operative Sleeve Gastrectomy Surgery  Medical Nutrition Therapy:  Appt start time: [id number] end time:  1155  Primary concerns today: Post-operative Bariatric Surgery Nutrition Management.  Kristina [name] returns having lost another 14 pounds. Trying to only weigh about 1x a week and trying "not get too hung up on the number." Feels discouraged with weight loss plateaus but "if I didn't lose another pound I'd be happy." No longer uses food as a companion. Continues to meet protein and fluid needs. Including fruit and bread but choosing high fiber options. Including a variety of foods.   Surgery date: 09/19/2015 Surgery type: Sleeve gastrectomy Start weight at [facility]: 316 lbs on 07/22/15 (highest weight 320 lbs) Weight today: 235.2 lbs Weight change: 14.2 lbs Total weight lost: 84.8 lbs  TANITA  BODY COMP RESULTS  09/04/15 10/03/15 11/20/15 02/02/16 04/19/16   BMI (kg/m^2) 55.5 52.3 48 44 41.7   Fat Mass (lbs) 181.5 167.5 148.5 126.8 119   Fat Free Mass (lbs) 132 127.5 122.5 121.8 116.2   Total Body Water (lbs) 96.5 93.5 89.5 89.6 85    Preferred Learning Style:   No preference indicated   Learning Readiness:   Ready  24-hr recall: B (AM): high protein oatmeal with 1/4 Premier shake (17g) Snk (AM): rest of Premier shake (21g) L (PM): 3-4 oz deli meat and cheese, a few strawberries (21-28g) Snk (PM): cheese stick or grapes (0-6g) D (PM): 2-2.5 [place] burger and vegetables (14g)  Snk (PM): Balanced Break (7g)  Fluid intake: water with sugar free flavoring, Propel, Powerade Zero, Fair Life milk, unsweetened tea (50-64 oz) + 22 oz protein shake (100 oz total per day per patient) Estimated total protein intake: 80-95 g/day  Medications: only synthroid, prozac, and prilosec (no longer on diabetes, pain, or HTN medication) Supplementation: taking  CBG monitoring: not testing Last patient reported A1c: 5.8%  Using straws: yes, no issues reported Drinking while  eating: no Hair loss: patient reports hair loss is slowing down Carbonated beverages: none N/V/D/C: 1 recent episode of regurgitation; diarrhea with sugar alcohols Dumping syndrome: none  Recent physical activity:  4x a week: cardio for an hour + 30 minutes of strength training  Progress Towards Goal(s):  In progress.  Handouts given during visit include:  none   Nutritional Diagnosis:  [place]-3.3 Overweight/obesity related to past poor dietary habits and physical inactivity as evidenced by patient w/ recent sleeve gastrectomy surgery following dietary guidelines for continued weight loss.    Intervention:  Nutrition counseling provided.  Teaching Method Utilized:  Visual Auditory Hands on  Barriers to learning/adherence to lifestyle change: none  Demonstrated degree of understanding via:  Teach Back   Monitoring/Evaluation:  Dietary intake, exercise, and body weight. Follow up in 2 months for 6 month post-op visit.

## 2016-04-19 NOTE — Patient Instructions (Addendum)
Goals:  Follow Phase 3B: High Protein + Non-Starchy Vegetables  Eat 3-6 small meals/snacks, every 3-5 hrs  Increase lean protein foods to meet 60g goal  Increase fluid intake to 64oz +  Avoid drinking 15 minutes before, during and 30 minutes after eating  Aim for >30 min of physical activity daily  Limit oatmeal, potatoes, frozen yogurt, and bread  Take 2 Celebrate vitamins per day, 2 Calcium citrate petites, add an iron supplement   Take all of these at least 2 hours apart    Keep up the good work!!  *Continue setting non-scale goals!  Non scale victories: laying on couch with grandchild, wearing clothes that were too small, tying shoes and putting on socks easily, yardwork, size 14, family becoming healthier, more stamina (cleaning whole house in 1 day), able to use more equipment at the gym, can get blood pressure checked on upper part of arm, sleeping without CPAP, less medication, more confidence, more clothes options, healthy relationship with food (nourishing your body!), enjoying things other than food, being a strong mom, feeling muscles flex, having more of a lap   Surgery date: 09/19/2015 Surgery type: Sleeve gastrectomy Start weight at [facility]: 316 lbs on 07/22/15 (highest weight 320 lbs) Weight today: 235.2 lbs Weight change: 14.2 lbs Total weight lost: 84.8 lbs  TANITA  BODY COMP RESULTS  09/04/15 10/03/15 11/20/15 02/02/16 04/19/16   BMI (kg/m^2) 55.5 52.3 48 44 41.7   Fat Mass (lbs) 181.5 167.5 148.5 126.8 119   Fat Free Mass (lbs) 132 127.5 122.5 121.8 116.2   Total Body Water (lbs) 96.5 93.5 89.5 89.6 85

## 2016-05-02 DIAGNOSIS — E538 Deficiency of other specified B group vitamins: Secondary | ICD-10-CM | POA: Diagnosis not present

## 2016-05-02 DIAGNOSIS — E119 Type 2 diabetes mellitus without complications: Secondary | ICD-10-CM | POA: Diagnosis not present

## 2016-05-02 DIAGNOSIS — Z6841 Body Mass Index (BMI) 40.0 and over, adult: Secondary | ICD-10-CM | POA: Diagnosis not present

## 2016-05-15 DIAGNOSIS — E2839 Other primary ovarian failure: Secondary | ICD-10-CM | POA: Diagnosis not present

## 2016-05-15 DIAGNOSIS — E559 Vitamin D deficiency, unspecified: Secondary | ICD-10-CM | POA: Diagnosis not present

## 2016-05-15 DIAGNOSIS — E119 Type 2 diabetes mellitus without complications: Secondary | ICD-10-CM | POA: Diagnosis not present

## 2016-05-15 DIAGNOSIS — E039 Hypothyroidism, unspecified: Secondary | ICD-10-CM | POA: Diagnosis not present

## 2016-05-15 DIAGNOSIS — Z6841 Body Mass Index (BMI) 40.0 and over, adult: Secondary | ICD-10-CM | POA: Diagnosis not present

## 2016-05-16 DIAGNOSIS — M545 Low back pain: Secondary | ICD-10-CM | POA: Diagnosis not present

## 2016-05-16 DIAGNOSIS — E669 Obesity, unspecified: Secondary | ICD-10-CM | POA: Diagnosis not present

## 2016-05-16 DIAGNOSIS — E1169 Type 2 diabetes mellitus with other specified complication: Secondary | ICD-10-CM | POA: Diagnosis not present

## 2016-05-16 DIAGNOSIS — J45909 Unspecified asthma, uncomplicated: Secondary | ICD-10-CM | POA: Diagnosis not present

## 2016-05-16 DIAGNOSIS — Z9884 Bariatric surgery status: Secondary | ICD-10-CM | POA: Diagnosis not present

## 2016-05-16 DIAGNOSIS — I10 Essential (primary) hypertension: Secondary | ICD-10-CM | POA: Diagnosis not present

## 2016-05-16 DIAGNOSIS — K219 Gastro-esophageal reflux disease without esophagitis: Secondary | ICD-10-CM | POA: Diagnosis not present

## 2016-05-16 DIAGNOSIS — G4730 Sleep apnea, unspecified: Secondary | ICD-10-CM | POA: Diagnosis not present

## 2016-05-29 DIAGNOSIS — Z6841 Body Mass Index (BMI) 40.0 and over, adult: Secondary | ICD-10-CM | POA: Diagnosis not present

## 2016-05-29 DIAGNOSIS — E119 Type 2 diabetes mellitus without complications: Secondary | ICD-10-CM | POA: Diagnosis not present

## 2016-06-10 DIAGNOSIS — E538 Deficiency of other specified B group vitamins: Secondary | ICD-10-CM | POA: Diagnosis not present

## 2016-06-10 DIAGNOSIS — J0191 Acute recurrent sinusitis, unspecified: Secondary | ICD-10-CM | POA: Diagnosis not present

## 2016-07-03 DIAGNOSIS — E538 Deficiency of other specified B group vitamins: Secondary | ICD-10-CM | POA: Diagnosis not present

## 2016-07-03 DIAGNOSIS — Z6841 Body Mass Index (BMI) 40.0 and over, adult: Secondary | ICD-10-CM | POA: Diagnosis not present

## 2016-07-17 ENCOUNTER — Ambulatory Visit: Payer: Self-pay | Admitting: Psychiatry

## 2016-07-24 ENCOUNTER — Ambulatory Visit: Payer: Self-pay | Admitting: Dietician

## 2016-08-05 DIAGNOSIS — E538 Deficiency of other specified B group vitamins: Secondary | ICD-10-CM | POA: Diagnosis not present

## 2016-08-05 DIAGNOSIS — Z23 Encounter for immunization: Secondary | ICD-10-CM | POA: Diagnosis not present

## 2016-08-05 DIAGNOSIS — Z6841 Body Mass Index (BMI) 40.0 and over, adult: Secondary | ICD-10-CM | POA: Diagnosis not present

## 2016-09-04 DIAGNOSIS — E039 Hypothyroidism, unspecified: Secondary | ICD-10-CM | POA: Diagnosis not present

## 2016-09-04 DIAGNOSIS — E559 Vitamin D deficiency, unspecified: Secondary | ICD-10-CM | POA: Diagnosis not present

## 2016-09-04 DIAGNOSIS — E2839 Other primary ovarian failure: Secondary | ICD-10-CM | POA: Diagnosis not present

## 2016-09-04 DIAGNOSIS — E538 Deficiency of other specified B group vitamins: Secondary | ICD-10-CM | POA: Diagnosis not present

## 2016-09-04 DIAGNOSIS — E785 Hyperlipidemia, unspecified: Secondary | ICD-10-CM | POA: Diagnosis not present

## 2016-09-04 DIAGNOSIS — E119 Type 2 diabetes mellitus without complications: Secondary | ICD-10-CM | POA: Diagnosis not present

## 2016-09-04 DIAGNOSIS — I10 Essential (primary) hypertension: Secondary | ICD-10-CM | POA: Diagnosis not present

## 2016-09-12 DIAGNOSIS — Z6841 Body Mass Index (BMI) 40.0 and over, adult: Secondary | ICD-10-CM | POA: Diagnosis not present

## 2016-10-01 DIAGNOSIS — E2839 Other primary ovarian failure: Secondary | ICD-10-CM | POA: Diagnosis not present

## 2016-10-01 DIAGNOSIS — E785 Hyperlipidemia, unspecified: Secondary | ICD-10-CM | POA: Diagnosis not present

## 2016-10-01 DIAGNOSIS — E039 Hypothyroidism, unspecified: Secondary | ICD-10-CM | POA: Diagnosis not present

## 2016-10-01 DIAGNOSIS — E559 Vitamin D deficiency, unspecified: Secondary | ICD-10-CM | POA: Diagnosis not present

## 2016-11-01 DIAGNOSIS — Z9884 Bariatric surgery status: Secondary | ICD-10-CM | POA: Diagnosis not present

## 2016-11-01 DIAGNOSIS — E669 Obesity, unspecified: Secondary | ICD-10-CM | POA: Diagnosis not present

## 2016-11-01 DIAGNOSIS — I10 Essential (primary) hypertension: Secondary | ICD-10-CM | POA: Diagnosis not present

## 2016-11-01 DIAGNOSIS — E7800 Pure hypercholesterolemia, unspecified: Secondary | ICD-10-CM | POA: Diagnosis not present

## 2016-11-01 DIAGNOSIS — G4730 Sleep apnea, unspecified: Secondary | ICD-10-CM | POA: Diagnosis not present

## 2016-11-01 DIAGNOSIS — K912 Postsurgical malabsorption, not elsewhere classified: Secondary | ICD-10-CM | POA: Diagnosis not present

## 2016-11-01 DIAGNOSIS — E1169 Type 2 diabetes mellitus with other specified complication: Secondary | ICD-10-CM | POA: Diagnosis not present

## 2016-11-01 DIAGNOSIS — M545 Low back pain: Secondary | ICD-10-CM | POA: Diagnosis not present

## 2016-11-11 DIAGNOSIS — E538 Deficiency of other specified B group vitamins: Secondary | ICD-10-CM | POA: Diagnosis not present

## 2016-12-23 DIAGNOSIS — M5442 Lumbago with sciatica, left side: Secondary | ICD-10-CM | POA: Diagnosis not present

## 2016-12-23 DIAGNOSIS — E7800 Pure hypercholesterolemia, unspecified: Secondary | ICD-10-CM | POA: Diagnosis not present

## 2016-12-23 DIAGNOSIS — G8929 Other chronic pain: Secondary | ICD-10-CM | POA: Diagnosis not present

## 2016-12-23 DIAGNOSIS — E1169 Type 2 diabetes mellitus with other specified complication: Secondary | ICD-10-CM | POA: Diagnosis not present

## 2016-12-23 DIAGNOSIS — M5136 Other intervertebral disc degeneration, lumbar region: Secondary | ICD-10-CM | POA: Diagnosis not present

## 2016-12-23 DIAGNOSIS — K912 Postsurgical malabsorption, not elsewhere classified: Secondary | ICD-10-CM | POA: Diagnosis not present

## 2017-01-14 DIAGNOSIS — M5136 Other intervertebral disc degeneration, lumbar region: Secondary | ICD-10-CM | POA: Diagnosis not present

## 2017-01-15 DIAGNOSIS — E119 Type 2 diabetes mellitus without complications: Secondary | ICD-10-CM | POA: Diagnosis not present

## 2017-01-15 DIAGNOSIS — E039 Hypothyroidism, unspecified: Secondary | ICD-10-CM | POA: Diagnosis not present

## 2017-01-15 DIAGNOSIS — I10 Essential (primary) hypertension: Secondary | ICD-10-CM | POA: Diagnosis not present

## 2017-01-15 DIAGNOSIS — E2839 Other primary ovarian failure: Secondary | ICD-10-CM | POA: Diagnosis not present

## 2017-01-17 DIAGNOSIS — M5136 Other intervertebral disc degeneration, lumbar region: Secondary | ICD-10-CM | POA: Diagnosis not present

## 2017-01-17 DIAGNOSIS — G8929 Other chronic pain: Secondary | ICD-10-CM | POA: Diagnosis not present

## 2017-01-17 DIAGNOSIS — M5442 Lumbago with sciatica, left side: Secondary | ICD-10-CM | POA: Diagnosis not present

## 2017-01-29 DIAGNOSIS — M5442 Lumbago with sciatica, left side: Secondary | ICD-10-CM | POA: Diagnosis not present

## 2017-01-29 DIAGNOSIS — G8929 Other chronic pain: Secondary | ICD-10-CM | POA: Diagnosis not present

## 2017-02-07 DIAGNOSIS — M5136 Other intervertebral disc degeneration, lumbar region: Secondary | ICD-10-CM | POA: Diagnosis not present

## 2017-02-07 DIAGNOSIS — G8929 Other chronic pain: Secondary | ICD-10-CM | POA: Diagnosis not present

## 2017-02-07 DIAGNOSIS — M5442 Lumbago with sciatica, left side: Secondary | ICD-10-CM | POA: Diagnosis not present

## 2017-03-13 DIAGNOSIS — M5136 Other intervertebral disc degeneration, lumbar region: Secondary | ICD-10-CM | POA: Diagnosis not present

## 2017-03-13 DIAGNOSIS — M47816 Spondylosis without myelopathy or radiculopathy, lumbar region: Secondary | ICD-10-CM | POA: Diagnosis not present

## 2017-04-01 DIAGNOSIS — E2839 Other primary ovarian failure: Secondary | ICD-10-CM | POA: Diagnosis not present

## 2017-04-01 DIAGNOSIS — E039 Hypothyroidism, unspecified: Secondary | ICD-10-CM | POA: Diagnosis not present

## 2017-04-01 DIAGNOSIS — E538 Deficiency of other specified B group vitamins: Secondary | ICD-10-CM | POA: Diagnosis not present

## 2017-04-03 DIAGNOSIS — Z6841 Body Mass Index (BMI) 40.0 and over, adult: Secondary | ICD-10-CM | POA: Diagnosis not present

## 2017-04-03 DIAGNOSIS — I10 Essential (primary) hypertension: Secondary | ICD-10-CM | POA: Diagnosis not present

## 2017-04-03 DIAGNOSIS — E119 Type 2 diabetes mellitus without complications: Secondary | ICD-10-CM | POA: Diagnosis not present

## 2017-04-16 DIAGNOSIS — I10 Essential (primary) hypertension: Secondary | ICD-10-CM | POA: Diagnosis not present

## 2017-04-16 DIAGNOSIS — Z6841 Body Mass Index (BMI) 40.0 and over, adult: Secondary | ICD-10-CM | POA: Diagnosis not present

## 2017-04-16 DIAGNOSIS — E119 Type 2 diabetes mellitus without complications: Secondary | ICD-10-CM | POA: Diagnosis not present

## 2017-04-22 ENCOUNTER — Ambulatory Visit (INDEPENDENT_AMBULATORY_CARE_PROVIDER_SITE_OTHER): Payer: Medicare Other | Admitting: Psychiatry

## 2017-04-22 DIAGNOSIS — F509 Eating disorder, unspecified: Secondary | ICD-10-CM | POA: Diagnosis not present

## 2017-04-22 DIAGNOSIS — F4321 Adjustment disorder with depressed mood: Secondary | ICD-10-CM | POA: Diagnosis not present

## 2017-05-27 ENCOUNTER — Ambulatory Visit: Payer: Medicare Other | Admitting: Psychiatry

## 2017-06-10 ENCOUNTER — Ambulatory Visit: Payer: Self-pay | Admitting: Psychiatry

## 2017-06-28 ENCOUNTER — Emergency Department (HOSPITAL_COMMUNITY): Payer: Medicare Other

## 2017-06-28 ENCOUNTER — Emergency Department (HOSPITAL_COMMUNITY)
Admission: EM | Admit: 2017-06-28 | Discharge: 2017-06-28 | Disposition: A | Discharge status: Home / Self Care | Payer: Medicare Other | Attending: Emergency Medicine | Admitting: Emergency Medicine

## 2017-06-28 ENCOUNTER — Encounter (HOSPITAL_COMMUNITY): Payer: Self-pay | Admitting: Emergency Medicine

## 2017-06-28 DIAGNOSIS — J309 Allergic rhinitis, unspecified: Secondary | ICD-10-CM | POA: Insufficient documentation

## 2017-06-28 DIAGNOSIS — E119 Type 2 diabetes mellitus without complications: Secondary | ICD-10-CM | POA: Diagnosis not present

## 2017-06-28 DIAGNOSIS — Z79899 Other long term (current) drug therapy: Secondary | ICD-10-CM | POA: Insufficient documentation

## 2017-06-28 DIAGNOSIS — J45901 Unspecified asthma with (acute) exacerbation: Secondary | ICD-10-CM | POA: Insufficient documentation

## 2017-06-28 DIAGNOSIS — R0603 Acute respiratory distress: Secondary | ICD-10-CM | POA: Diagnosis not present

## 2017-06-28 DIAGNOSIS — J399 Disease of upper respiratory tract, unspecified: Secondary | ICD-10-CM

## 2017-06-28 DIAGNOSIS — I10 Essential (primary) hypertension: Secondary | ICD-10-CM | POA: Insufficient documentation

## 2017-06-28 DIAGNOSIS — R0602 Shortness of breath: Secondary | ICD-10-CM | POA: Diagnosis present

## 2017-06-28 DIAGNOSIS — E039 Hypothyroidism, unspecified: Secondary | ICD-10-CM | POA: Insufficient documentation

## 2017-06-28 MED ORDER — ALBUTEROL SULFATE (2.5 MG/3ML) 0.083% IN NEBU
5.0000 mg | INHALATION_SOLUTION | Freq: Once | RESPIRATORY_TRACT | Status: AC
  Administered 2017-06-28: 5 mg via RESPIRATORY_TRACT
  Filled 2017-06-28: qty 6

## 2017-06-28 MED ORDER — ALBUTEROL SULFATE HFA 108 (90 BASE) MCG/ACT IN AERS
2.0000 | INHALATION_SPRAY | Freq: Once | RESPIRATORY_TRACT | Status: AC
  Administered 2017-06-28: 2 via RESPIRATORY_TRACT
  Filled 2017-06-28: qty 6.7

## 2017-06-28 MED ORDER — ALBUTEROL SULFATE HFA 108 (90 BASE) MCG/ACT IN AERS: 2.0000 | INHALATION_SPRAY | RESPIRATORY_TRACT | 3 refills | Status: DC | PRN

## 2017-06-28 NOTE — ED Triage Notes (Signed)
Power off, cannot use home neb, wheezing  Cough with white e4xpectorant,

## 2017-06-28 NOTE — ED Notes (Signed)
Respiratory at bedside.

## 2017-06-28 NOTE — Discharge Instructions (Signed)
Please obtain all of your results from medical records or have your doctors office obtain the results - share them with your doctor - you should be seen at your doctors office in the next 2 days. Call today to arrange your follow up. Take the medications as prescribed. Please review all of the medicines and only take them if you do not have an allergy to them. Please be aware that if you are taking birth control pills, taking other prescriptions, ESPECIALLY ANTIBIOTICS may make the birth control ineffective - if this is the case, either do not engage in sexual activity or use alternative methods of birth control such as condoms until you have finished the medicine and your family doctor says it is OK to restart them. If you are on a blood thinner such as COUMADIN, be aware that any other medicine that you take may cause the coumadin to either work too much, or not enough - you should have your coumadin level rechecked in next 7 days if this is the case.  ?  It is also a possibility that you have an allergic reaction to any of the medicines that you have been prescribed - Everybody reacts differently to medications and while MOST people have no trouble with most medicines, you may have a reaction such as nausea, vomiting, rash, swelling, shortness of breath. If this is the case, please stop taking the medicine immediately and contact your physician.  ?  You should return to the ER if you develop severe or worsening symptoms.   Albuterol every 4 hours as needed, use the spacer with your inhaler  Over-the-counter medications for cough and cold are appropriate and can be used daily as needed  Seek medical attention for severe or worsening symptoms including difficulty breathing chest pain fevers or vomiting
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
## 2017-07-23 ENCOUNTER — Ambulatory Visit: Payer: Medicare Other | Admitting: Psychiatry

## 2017-08-06 ENCOUNTER — Ambulatory Visit: Payer: Medicare Other | Admitting: Psychiatry

## 2017-10-08 DIAGNOSIS — E2839 Other primary ovarian failure: Secondary | ICD-10-CM | POA: Diagnosis not present

## 2017-10-08 DIAGNOSIS — E785 Hyperlipidemia, unspecified: Secondary | ICD-10-CM | POA: Diagnosis not present

## 2017-10-08 DIAGNOSIS — E559 Vitamin D deficiency, unspecified: Secondary | ICD-10-CM | POA: Diagnosis not present

## 2017-10-08 DIAGNOSIS — I10 Essential (primary) hypertension: Secondary | ICD-10-CM | POA: Diagnosis not present

## 2017-10-08 DIAGNOSIS — E538 Deficiency of other specified B group vitamins: Secondary | ICD-10-CM | POA: Diagnosis not present

## 2017-10-08 DIAGNOSIS — N959 Unspecified menopausal and perimenopausal disorder: Secondary | ICD-10-CM | POA: Diagnosis not present

## 2017-10-08 DIAGNOSIS — E119 Type 2 diabetes mellitus without complications: Secondary | ICD-10-CM | POA: Diagnosis not present

## 2017-11-25 DIAGNOSIS — I10 Essential (primary) hypertension: Secondary | ICD-10-CM | POA: Diagnosis not present

## 2017-11-25 DIAGNOSIS — E538 Deficiency of other specified B group vitamins: Secondary | ICD-10-CM | POA: Diagnosis not present

## 2017-11-25 DIAGNOSIS — N959 Unspecified menopausal and perimenopausal disorder: Secondary | ICD-10-CM | POA: Diagnosis not present

## 2017-11-26 DIAGNOSIS — Z9884 Bariatric surgery status: Secondary | ICD-10-CM | POA: Diagnosis not present

## 2017-11-26 DIAGNOSIS — I10 Essential (primary) hypertension: Secondary | ICD-10-CM | POA: Diagnosis not present

## 2017-11-26 DIAGNOSIS — K219 Gastro-esophageal reflux disease without esophagitis: Secondary | ICD-10-CM | POA: Diagnosis not present

## 2017-12-01 ENCOUNTER — Other Ambulatory Visit: Payer: Self-pay | Admitting: General Surgery

## 2017-12-01 DIAGNOSIS — Z9884 Bariatric surgery status: Secondary | ICD-10-CM

## 2017-12-22 DIAGNOSIS — Z1231 Encounter for screening mammogram for malignant neoplasm of breast: Secondary | ICD-10-CM | POA: Diagnosis not present

## 2017-12-22 DIAGNOSIS — Z124 Encounter for screening for malignant neoplasm of cervix: Secondary | ICD-10-CM | POA: Diagnosis not present

## 2017-12-24 ENCOUNTER — Ambulatory Visit
Admission: RE | Admit: 2017-12-24 | Discharge: 2017-12-24 | Disposition: A | Discharge status: Home / Self Care | Payer: Medicare Other | Source: Ambulatory Visit | Attending: General Surgery | Admitting: General Surgery

## 2017-12-24 DIAGNOSIS — R1030 Lower abdominal pain, unspecified: Secondary | ICD-10-CM | POA: Diagnosis not present

## 2017-12-24 DIAGNOSIS — Z9884 Bariatric surgery status: Secondary | ICD-10-CM

## 2017-12-24 MED ORDER — IOPAMIDOL (ISOVUE-300) INJECTION 61%
125.0000 mL | Freq: Once | INTRAVENOUS | Status: AC | PRN
  Administered 2017-12-24: 125 mL via INTRAVENOUS

## 2018-01-06 DIAGNOSIS — E538 Deficiency of other specified B group vitamins: Secondary | ICD-10-CM | POA: Diagnosis not present

## 2018-01-12 DIAGNOSIS — E2839 Other primary ovarian failure: Secondary | ICD-10-CM | POA: Diagnosis not present

## 2018-01-12 DIAGNOSIS — E039 Hypothyroidism, unspecified: Secondary | ICD-10-CM | POA: Diagnosis not present

## 2018-01-12 DIAGNOSIS — N959 Unspecified menopausal and perimenopausal disorder: Secondary | ICD-10-CM | POA: Diagnosis not present

## 2018-01-15 ENCOUNTER — Encounter (INDEPENDENT_AMBULATORY_CARE_PROVIDER_SITE_OTHER): Payer: Self-pay

## 2018-01-21 ENCOUNTER — Encounter (INDEPENDENT_AMBULATORY_CARE_PROVIDER_SITE_OTHER): Payer: Self-pay | Admitting: Family Medicine

## 2018-01-21 ENCOUNTER — Ambulatory Visit (INDEPENDENT_AMBULATORY_CARE_PROVIDER_SITE_OTHER): Payer: Medicare Other | Admitting: Family Medicine

## 2018-01-21 ENCOUNTER — Encounter (INDEPENDENT_AMBULATORY_CARE_PROVIDER_SITE_OTHER): Payer: Self-pay

## 2018-01-21 VITALS — BP 127/74 | HR 72 | Temp 97.5°F | Ht 63.0 in | Wt 248.0 lb

## 2018-01-21 DIAGNOSIS — Z6841 Body Mass Index (BMI) 40.0 and over, adult: Secondary | ICD-10-CM | POA: Diagnosis not present

## 2018-01-21 DIAGNOSIS — Z1331 Encounter for screening for depression: Secondary | ICD-10-CM

## 2018-01-21 DIAGNOSIS — E538 Deficiency of other specified B group vitamins: Secondary | ICD-10-CM | POA: Diagnosis not present

## 2018-01-21 DIAGNOSIS — R5383 Other fatigue: Secondary | ICD-10-CM | POA: Diagnosis not present

## 2018-01-21 DIAGNOSIS — R0602 Shortness of breath: Secondary | ICD-10-CM

## 2018-01-21 DIAGNOSIS — E559 Vitamin D deficiency, unspecified: Secondary | ICD-10-CM | POA: Diagnosis not present

## 2018-01-21 DIAGNOSIS — R7303 Prediabetes: Secondary | ICD-10-CM

## 2018-01-21 DIAGNOSIS — Z0289 Encounter for other administrative examinations: Secondary | ICD-10-CM

## 2018-01-22 LAB — FOLATE: Folate: 11.9 ng/mL (ref >3.0)

## 2018-01-22 LAB — T3: T3, Total: 251 ng/dL — ABNORMAL HIGH (ref 71–180)

## 2018-01-22 LAB — INSULIN, RANDOM: INSULIN: 11 u[IU]/mL (ref 2.6–24.9)

## 2018-01-22 LAB — VITAMIN B12: Vitamin B-12: 579 pg/mL (ref 232–1245)

## 2018-01-22 LAB — T4, FREE: FREE T4: 0.99 ng/dL (ref 0.82–1.77)

## 2018-01-22 LAB — TSH: TSH: 1.18 u[IU]/mL (ref 0.450–4.500)

## 2018-01-22 LAB — VITAMIN D 25 HYDROXY (VIT D DEFICIENCY, FRACTURES): VIT D 25 HYDROXY: 28.8 ng/mL — AB (ref 30.0–100.0)

## 2018-01-27 NOTE — Progress Notes (Signed)
Office: [phone]  /  Fax: [phone]   Dear Dr. Redmond Pulling,   Thank you for referring Kristina [name] to our clinic. The following note includes my evaluation and treatment recommendations.  HPI:   Chief Complaint: OBESITY    Kristina [name] has been referred by [name], MD for consultation regarding her obesity and obesity related comorbidities.    Kristina [name] (MR# [id number]) is a 47 y.o. female who presents on 01/21/2018 for obesity evaluation and treatment. Current BMI is Body mass index is 43.93 kg/m.Kristina [name] has been struggling with her weight for many years and has been unsuccessful in either losing weight, maintaining weight loss, or reaching her healthy weight goal.      Kristina [name] has a history of gastric sleeve (2017, has gained approximately 24 lbs back).     Kristina [name] attended our information session and states she is currently in the action stage of change and ready to dedicate time achieving and maintaining a healthier weight. Kristina [name] is interested in becoming our patient and working on intensive lifestyle modifications including (but not limited to) diet, exercise and weight loss.    Kristina [name] states her family eats meals together she thinks her family will eat healthier with  her she struggles with family and or coworkers weight loss sabotage her desired weight loss is 98 lbs she has been heavy most of  her life she started gaining weight after having children her heaviest weight ever was 320 lbs she is a picky eater and doesn't like to eat healthier foods  she has significant food cravings issues  she snacks frequently in the evenings she skips meals frequently she is frequently drinking liquids with calories she frequently makes poor food choices she has problems with excessive hunger  she struggles with emotional eating    Fatigue Kristina [name] feels her energy is lower than it should be. This has worsened with weight gain and has not worsened recently. Kristina [name] admits to  daytime somnolence and  admits to waking up still tired. Patient is at risk for obstructive sleep apnea. Patent has a history of symptoms of daytime fatigue and morning headache. Patient generally gets 6 hours of sleep per night, and states they generally have restful sleep. Snoring is present. Apneic episodes are present. Epworth Sleepiness Score is 9.  Dyspnea on exertion Kristina [name] notes increasing shortness of breath with exercising and seems to be worsening over time with weight gain. She notes getting out of breath sooner with activity than she used to. This has not gotten worse recently. EKG-poor R wave progression. Kristina [name] denies orthopnea.  Pre-Diabetes Kristina [name] has a diagnosis of pre-diabetes based on her elevated Hgb A1c and was informed this puts her at greater risk of developing diabetes. Diagnosed in 2004, Hgb A1c mid 6's, she is trying to diet control only. She is not taking metformin currently and continues to work on diet and exercise to decrease risk of diabetes. She denies nausea or hypoglycemia.  Vitamin B12 Deficiency Kristina [name] has a diagnosis of B12 insufficiency and notes fatigue. Likely secondary to gastric sleeve. She is not a vegetarian and does not have a previous diagnosis of pernicious anemia. She has a history of weight loss surgery.   Vitamin D Deficiency Kristina [name] has a diagnosis of vitamin D deficiency. Diagnosis likely given obesity. She is not currently taking vit D and denies nausea, vomiting or muscle weakness.  Depression Screen Kristina [name]'s Food and Mood (modified PHQ-9) score was  Depression screen [facility] 2/9 01/21/2018  Decreased Interest  3  Down, Depressed, Hopeless 3  PHQ - 2 Score 6  Altered sleeping 2  Tired, decreased energy 3  Change in appetite 3  Feeling bad or failure about yourself  3  Trouble concentrating 1  Moving slowly or fidgety/restless 1  Suicidal thoughts 0  PHQ-9 Score 19  Difficult doing work/chores Very difficult    ALLERGIES: Allergies  Allergen  Reactions  . Ampicillin Anaphylaxis    Has patient had a PCN reaction causing immediate rash, facial/tongue/throat swelling, SOB or lightheadedness with hypotension: Yes Has patient had a PCN reaction causing severe rash involving mucus membranes or skin necrosis: Yes Has patient had a PCN reaction that required hospitalization Yes Has patient had a PCN reaction occurring within the last 10 years: No If all of the above answers are "NO", then may proceed with Cephalosporin use.   . Codeine Hives and Shortness Of Breath  . Sulfonamide Derivatives Itching and Rash    MEDICATIONS: Current Outpatient Medications on File Prior to Visit  Medication Sig Dispense Refill  . Cholecalciferol (VITAMIN D3) 5000 units CAPS Take by mouth.    . Cyanocobalamin (B-12) 1000 MCG/ML KIT Inject as directed.    [name] dexlansoprazole (DEXILANT) 60 MG capsule Take 60 mg by mouth daily.    [name] estradiol (ESTRACE) 1 MG tablet Take 1 mg by mouth daily.    [name] FLUoxetine (PROZAC) 20 MG capsule Take 20 mg by mouth at bedtime.    . progesterone (PROMETRIUM) 200 MG capsule Take 200 mg by mouth daily.    . TESTOSTERONE IL 0.2 mLs daily.    [name] thyroid (ARMOUR) 90 MG tablet Take 90 mg by mouth daily.     No current facility-administered medications on file prior to visit.     PAST MEDICAL HISTORY: Past Medical History:  Diagnosis Date  . Allergic rhinitis   . Anxiety   . Asthma   . Bulging disc   . Cancer (HCC)    cervical cancer   . Chronic back pain   . Chronic cough   . Depression   . Diabetes mellitus    diet control only  . Diverticulosis   . Dyspnea    chronic  . Family history of adverse reaction to anesthesia    pts daughter had reaction to profolol - difficulty with breathing   . GERD (gastroesophageal reflux disease)   . High cholesterol   . History of bronchitis   . Hx of adenomatous and sessile serrated colonic polyps   . Hypertension   . Hypothyroidism   . Lumbosacral spondylosis without  myelopathy    history bulging disc-chronic pain(level 3 to 8).  . Normal cardiac stress test 02/2015   with normal EF  . Numbness    feet bilat comes and goes   . Obesity   . Obstructive sleep apnea    uses c-pap at night  . Palpitations   . Pneumonia    last episode 2012  . PONV (postoperative nausea and vomiting)   . Prediabetes   . Restless leg   . Vitamin D deficiency   . Vocal cord dysfunction     PAST SURGICAL HISTORY: Past Surgical History:  Procedure Laterality Date  . ABDOMINAL HYSTERECTOMY  1996  . CHOLECYSTECTOMY  1994  . COLONOSCOPY WITH PROPOFOL N/A 03/10/2015   Procedure: COLONOSCOPY WITH PROPOFOL;  Surgeon: [name], MD;  Location: [facility];  Service: Endoscopy;  Laterality: N/A;  . ESOPHAGOGASTRODUODENOSCOPY N/A 10/27/2013   Procedure: ESOPHAGOGASTRODUODENOSCOPY (EGD);  Surgeon:  [name], MD;  Location: [facility];  Service: Endoscopy;  Laterality: N/A;  . KNEE ARTHROSCOPY Right   . LAPAROSCOPIC GASTRIC SLEEVE RESECTION WITH HIATAL HERNIA REPAIR N/A 09/19/2015   Procedure: LAPAROSCOPIC GASTRIC SLEEVE RESECTION WITH HIATAL HERNIA REPAIR;  Surgeon: [name], MD;  Location: [facility];  Service: General;  Laterality: N/A;  . NOSE SURGERY  2004  . ovaries removed    . TUBAL LIGATION    . UPPER GI ENDOSCOPY  09/19/2015   Procedure: UPPER GI ENDOSCOPY;  Surgeon: [name], MD;  Location: [facility];  Service: General;;    SOCIAL HISTORY: Social History   Tobacco Use  . Smoking status: Never Smoker  . Smokeless tobacco: Never Used  Substance Use Topics  . Alcohol use: No  . Drug use: No    FAMILY HISTORY: Family History  Problem Relation Age of Onset  . Emphysema Mother   . Allergies Mother   . Heart disease Mother   . Asthma Mother   . Ovarian cancer Mother   . Hypertension Mother   . Diabetes Mother   . High Cholesterol Mother   . Stroke Mother   . Thyroid disease Mother   . Depression Mother   . Anxiety disorder Mother   . Heart  disease Father        deceased at age 14 from heart attack  . Sudden death Father   . High Cholesterol Father   . High blood pressure Father   . Stroke Maternal Grandmother   . Heart disease Maternal Grandmother   . Cancer Maternal Grandfather   . Heart disease Paternal Grandmother   . Multiple sclerosis Paternal Grandmother   . Heart attack Paternal Grandfather   . Thyroid disease Brother        died at 30  . Heart attack Brother        died at 50  . COPD Brother   . Hyperthyroidism Sister   . Hypertension Brother   . Hypertension Daughter   . Allergies Child   . Asthma Son   . Asthma Brother   . Asthma Brother   . Lung cancer Maternal Aunt   . COPD Sister        died at 42  . Sudden death Sister   . Asthma Son     ROS: Review of Systems  Constitutional: Positive for malaise/fatigue. Negative for weight loss.       + Trouble sleeping  HENT: Positive for sinus pain.   Eyes:       + Vision changes  Respiratory: Positive for shortness of breath (with exertion).        + Sudden awakening from sleep with shortness of breath  Cardiovascular: Negative for orthopnea.  Gastrointestinal: Positive for diarrhea and heartburn. Negative for nausea and vomiting.  Musculoskeletal: Positive for back pain.       Negative muscle weakness  Skin:       + Dryness  Neurological: Positive for headaches.  Endo/Heme/Allergies:       Negative hypoglycemia  Psychiatric/Behavioral: Positive for depression.       + Stress    PHYSICAL EXAM: Blood pressure 127/74, pulse 72, temperature (!) 97.5 F (36.4 C), temperature source Oral, height 5' 3" (1.6 m), weight 248 lb (112.5 kg), SpO2 96 %. Body mass index is 43.93 kg/m. Physical Exam  Constitutional: She is oriented to person, place, and time. She appears well-developed and well-nourished.  HENT:  Head: Normocephalic and atraumatic.  Nose: Nose  normal.  Eyes: EOM are normal. No scleral icterus.  Neck: Normal range of motion. Neck  supple. No thyromegaly present.  Cardiovascular: Normal rate and regular rhythm.  Pulmonary/Chest: Effort normal. No respiratory distress.  Abdominal: Soft. There is no tenderness.  + Obesity  Musculoskeletal:  Range of motion normal in all 4 extremities Trace edema noted in bilateral lower extremities  Neurological: She is alert and oriented to person, place, and time. Coordination normal.  Skin: Skin is warm and dry.  Psychiatric: She has a normal mood and affect. Her behavior is normal.  Vitals reviewed.   RECENT LABS AND TESTS: BMET    Component Value Date/Time   NA 143 11/10/2015 0527   K 3.9 11/10/2015 0527   CL 112 (H) 11/10/2015 0527   CO2 25 11/10/2015 0527   GLUCOSE 84 11/10/2015 0527   BUN 5 (L) 11/10/2015 0527   CREATININE 0.62 11/10/2015 0527   CREATININE 0.88 02/02/2015 1458   CREATININE 0.88 02/02/2015 1458   CALCIUM 8.3 (L) 11/10/2015 0527   GFRNONAA >60 11/10/2015 0527   GFRAA >60 11/10/2015 0527   Lab Results  Component Value Date   HGBA1C 6.3 (H) 09/14/2015   Lab Results  Component Value Date   INSULIN 11.0 01/21/2018   CBC    Component Value Date/Time   WBC 3.8 (L) 11/10/2015 0527   RBC 4.46 11/10/2015 0527   HGB 12.2 11/10/2015 0527   HCT 39.0 11/10/2015 0527   PLT 121 (L) 11/10/2015 0527   MCV 87.4 11/10/2015 0527   MCH 27.4 11/10/2015 0527   MCHC 31.3 11/10/2015 0527   RDW 15.3 11/10/2015 0527   LYMPHSABS 2.0 11/09/2015 0515   MONOABS 0.8 11/09/2015 0515   EOSABS 0.1 11/09/2015 0515   BASOSABS 0.0 11/09/2015 0515   Iron/TIBC/Ferritin/ %Sat No results found for: IRON, TIBC, FERRITIN, IRONPCTSAT Lipid Panel     Component Value Date/Time   CHOL  02/12/2008 0540    152        ATP III CLASSIFICATION:  <200     mg/dL   Desirable  200-239  mg/dL   Borderline High  >=240    mg/dL   High   TRIG 126 02/12/2008 0540   HDL 34 (L) 02/12/2008 0540   CHOLHDL 4.5 02/12/2008 0540   VLDL 25 02/12/2008 0540   LDLCALC  02/12/2008 0540    93         Total Cholesterol/HDL:CHD Risk Coronary Heart Disease Risk Table                     Men   Women  1/2 Average Risk   3.4   3.3   Hepatic Function Panel     Component Value Date/Time   PROT 7.6 11/09/2015 0515   ALBUMIN 4.4 11/09/2015 0515   AST 28 11/09/2015 0515   ALT 37 11/09/2015 0515   ALKPHOS 102 11/09/2015 0515   BILITOT 0.5 11/09/2015 0515      Component Value Date/Time   TSH 1.180 01/21/2018 1056    ECG  shows NSR with a rate of 71 BPM INDIRECT CALORIMETER done today shows a VO2 of 201 and a REE of 1400.  Her calculated basal metabolic rate is 9470 thus her basal metabolic rate is worse than expected.    ASSESSMENT AND PLAN: Other fatigue - Plan: EKG 12-Lead, T3, T4, free, TSH, CBC With Differential  Shortness of breath on exertion  Prediabetes - Plan: Insulin, random, Comprehensive metabolic panel, Hemoglobin A1c, Lipid  Panel With LDL/HDL Ratio  Vitamin B12 deficiency - Plan: Vitamin B12, Folate  Vitamin D deficiency - Plan: VITAMIN D 25 Hydroxy (Vit-D Deficiency, Fractures)  Depression screening  Class 3 severe obesity with serious comorbidity and body mass index (BMI) of 40.0 to 44.9 in adult, unspecified obesity type ([place])  PLAN:  Fatigue Kristina [name] was informed that her fatigue may be related to obesity, depression or many other causes. Labs will be ordered, and in the meanwhile Kristina [name] has agreed to work on diet, exercise and weight loss to help with fatigue. Proper sleep hygiene was discussed including the need for 7-8 hours of quality sleep each night. A sleep study was not ordered based on symptoms and Epworth score.  Dyspnea on exertion Kristina [name]'s shortness of breath appears to be obesity related and exercise induced. She has agreed to work on weight loss and gradually increase exercise to treat her exercise induced shortness of breath. If Kristina [name] follows our instructions and loses weight without improvement of her shortness of breath, we will plan to refer  to pulmonology. We will monitor this condition regularly. Kristina [name] agrees to this plan.  Pre-Diabetes Kristina [name] will continue to work on weight loss, exercise, and decreasing simple carbohydrates in her diet to help decrease the risk of diabetes. We dicussed metformin including benefits and risks. She was informed that eating too many simple carbohydrates or too many calories at one sitting increases the likelihood of GI side effects. Kristina [name] declined metformin for now and a prescription was not written today. We will check insulin level today (She states she had labs done within the past 30 days). Kristina [name] agrees to follow up with our clinic in 2 weeks as directed to monitor her progress.  Vitamin B12 Deficiency Kristina [name] will work on increasing B12 rich foods in her diet. B12 supplementation was not prescribed today. We will check labs today and Havanah agrees to follow up with our clinic in 2 weeks.  Vitamin D Deficiency Yong was informed that low vitamin D levels contributes to fatigue and are associated with obesity, breast, and colon cancer. Jacquelina will follow up for routine testing of vitamin D, at least 2-3 times per year. She was informed of the risk of over-replacement of vitamin D and agrees to not increase her dose unless she discusses this with [place] first. We will check labs today and Sheriann agrees to follow up with our clinic in 2 weeks.  Depression Screen Adelise had a strongly positive depression screening. Depression is commonly associated with obesity and often results in emotional eating behaviors. We will monitor this closely and work on CBT to help improve the non-hunger eating patterns. Referral to Psychology may be required if no improvement is seen as she continues in our clinic.  Obesity Elmarie is currently in the action stage of change and her goal is to continue with weight loss efforts. I recommend Patrice begin the structured treatment plan as follows:  She has agreed to follow the Category 2  plan [place] has been instructed to eventually work up to a goal of 150 minutes of combined cardio and strengthening exercise per week for weight loss and overall health benefits. We discussed the following Behavioral Modification Strategies today: increasing lean protein intake, increasing vegetables, decrease eating out, work on meal planning and easy cooking plans, increase H20 intake, and planning for success   She was informed of the importance of frequent follow up visits to maximize her success with intensive lifestyle modifications for her multiple health conditions. She  was informed we would discuss her lab results at her next visit unless there is a critical issue that needs to be addressed sooner. Contessa agreed to keep her next visit at the agreed upon time to discuss these results.    OBESITY BEHAVIORAL INTERVENTION VISIT  Today's visit was # 1 out of 22.  Starting weight: 248 lbs Starting date: 01/21/18 Today's weight : 248 lbs  Today's date: 01/21/2018 Total lbs lost to date: 0 (Patients must lose 7 lbs in the first 6 months to continue with counseling)   ASK: We discussed the diagnosis of obesity with Princess Perna today and Lamiah agreed to give [place] permission to discuss obesity behavioral modification therapy today.  ASSESS: Peola has the diagnosis of obesity and her BMI today is 43.94 Mayda is in the action stage of change   ADVISE: Addy was educated on the multiple health risks of obesity as well as the benefit of weight loss to improve her health. She was advised of the need for long term treatment and the importance of lifestyle modifications.  AGREE: Multiple dietary modification options and treatment options were discussed and  Citlally agreed to the above obesity treatment plan.   I, Trixie Dredge, am acting as transcriptionist for Ilene Qua, MD   I have reviewed the above documentation for accuracy and completeness, and I agree with the above. -  Ilene Qua, MD

## 2018-01-28 ENCOUNTER — Encounter

## 2018-01-28 ENCOUNTER — Encounter (INDEPENDENT_AMBULATORY_CARE_PROVIDER_SITE_OTHER): Payer: Self-pay | Admitting: Family Medicine

## 2018-01-28 ENCOUNTER — Ambulatory Visit (INDEPENDENT_AMBULATORY_CARE_PROVIDER_SITE_OTHER): Payer: Medicare Other | Admitting: Internal Medicine

## 2018-01-28 ENCOUNTER — Encounter: Payer: Self-pay | Admitting: Internal Medicine

## 2018-01-28 VITALS — BP 120/80 | HR 80 | Ht 63.0 in | Wt 246.6 lb

## 2018-01-28 DIAGNOSIS — Z86010 Personal history of colonic polyps: Secondary | ICD-10-CM

## 2018-01-28 DIAGNOSIS — K219 Gastro-esophageal reflux disease without esophagitis: Secondary | ICD-10-CM | POA: Diagnosis not present

## 2018-01-28 DIAGNOSIS — K529 Noninfective gastroenteritis and colitis, unspecified: Secondary | ICD-10-CM

## 2018-01-28 DIAGNOSIS — L309 Dermatitis, unspecified: Secondary | ICD-10-CM

## 2018-01-28 DIAGNOSIS — Z9884 Bariatric surgery status: Secondary | ICD-10-CM

## 2018-01-28 DIAGNOSIS — K580 Irritable bowel syndrome with diarrhea: Secondary | ICD-10-CM | POA: Diagnosis not present

## 2018-01-28 MED ORDER — SOD PICOSULFATE-MAG OX-CIT ACD 10-3.5-12 MG-GM -GM/160ML PO SOLN: 1.0000 | Freq: Once | ORAL | 0 refills | Status: AC

## 2018-01-28 MED ORDER — DICYCLOMINE HCL 20 MG PO TABS: 20.0000 mg | ORAL_TABLET | Freq: Three times a day (TID) | ORAL | 0 refills | Status: DC

## 2018-01-28 MED ORDER — NYSTATIN-TRIAMCINOLONE 100000-0.1 UNIT/GM-% EX OINT: 1.0000 "application " | TOPICAL_OINTMENT | Freq: Two times a day (BID) | CUTANEOUS | 1 refills | Status: DC

## 2018-01-28 NOTE — Progress Notes (Signed)
Kristina [name] 47 y.o. Apr 22, 1971 [id number]  Assessment & Plan:   Encounter Diagnoses  Name Primary?  . Chronic diarrhea Yes  . Gastroesophageal reflux disease, esophagitis presence not specified   . Bariatric surgery status - gastric sleeve   . Irritable bowel syndrome with diarrhea   . Perianal dermatitis   . Hx of adenomatous colonic polyps    Cause of her problems not entirely clear ? If sleeve related - especially w/ reflux could be Other bariatric procedures more likely to lead to SIBO - but wonder if she has SIBO  W/U and Tx as below  Dicyclomine prn  EGD Colonoscopy Gaviscon prn Nystatin/triamcinolone ointment  The risks and benefits as well as alternatives of endoscopic procedure(s) have been discussed and reviewed. All questions answered. The patient agrees to proceed.  ZC:HYIFOYD, Doristine Johns, MD [name], MD Subjective:   Chief Complaint: reflux, diarrhea  HPI Patient w/ prior hx IBS - last seen 2016 - subsequently had gastric sleeve. She has had Dexilant- reisitant heartburn and reflux sxs w/ chest burning. Also having mos - yrs of explosive diarrhea - watery. Also stopped caffeine and ,ilk products w/o relief. Anal irritation. Neg CT abd/pelvis April 2019 Lost and regained some weight after surgery  Hx adenomatous colon polyps 2016 max 10 mm so due for recall  Allergies  Allergen Reactions  . Ampicillin Anaphylaxis    Has patient had a PCN reaction causing immediate rash, facial/tongue/throat swelling, SOB or lightheadedness with hypotension: Yes Has patient had a PCN reaction causing severe rash involving mucus membranes or skin necrosis: Yes Has patient had a PCN reaction that required hospitalization Yes Has patient had a PCN reaction occurring within the last 10 years: No If all of the above answers are "NO", then may proceed with Cephalosporin use.   . Codeine Hives and Shortness Of Breath  . Sulfonamide Derivatives Itching and Rash    Current Meds  Medication Sig  . Cholecalciferol (VITAMIN D3) 5000 units CAPS Take by mouth.  . Cyanocobalamin (B-12) 1000 MCG/ML KIT Inject as directed.  [name] dexlansoprazole (DEXILANT) 60 MG capsule Take 60 mg by mouth daily.  [name] estradiol (ESTRACE) 1 MG tablet Take 2 mg by mouth daily.   [name] FLUoxetine (PROZAC) 20 MG capsule Take 40 mg by mouth at bedtime.   . progesterone (PROMETRIUM) 200 MG capsule Take 200 mg by mouth daily.  . TESTOSTERONE IL 0.2 mLs 2 (two) times a week.   . thyroid (ARMOUR) 90 MG tablet Take 120 mg by mouth daily.    Past Medical History:  Diagnosis Date  . Allergic rhinitis   . Anxiety   . Asthma   . Bulging disc   . Cancer (HCC)    cervical cancer   . Chronic back pain   . Chronic cough   . Depression   . Diabetes mellitus    diet control only  . Diverticulosis   . Dyspnea    chronic  . Family history of adverse reaction to anesthesia    pts daughter had reaction to profolol - difficulty with breathing   . GERD (gastroesophageal reflux disease)   . High cholesterol   . History of bronchitis   . Hx of adenomatous and sessile serrated colonic polyps   . Hypertension   . Hypothyroidism   . Lumbosacral spondylosis without myelopathy    history bulging disc-chronic pain(level 3 to 8).  . Normal cardiac stress test 02/2015   with normal EF  . Numbness  feet bilat comes and goes   . Obesity   . Obstructive sleep apnea    uses c-pap at night  . Palpitations   . Pneumonia    last episode 2012  . PONV (postoperative nausea and vomiting)   . Prediabetes   . Restless leg   . Vitamin D deficiency   . Vocal cord dysfunction    Past Surgical History:  Procedure Laterality Date  . ABDOMINAL HYSTERECTOMY  1996  . CHOLECYSTECTOMY  1994  . COLONOSCOPY WITH PROPOFOL N/A 03/10/2015   Procedure: COLONOSCOPY WITH PROPOFOL;  Surgeon: [name], MD;  Location: [facility];  Service: Endoscopy;  Laterality: N/A;  . ESOPHAGOGASTRODUODENOSCOPY N/A  10/27/2013   Procedure: ESOPHAGOGASTRODUODENOSCOPY (EGD);  Surgeon: [name], MD;  Location: [facility];  Service: Endoscopy;  Laterality: N/A;  . KNEE ARTHROSCOPY Right   . LAPAROSCOPIC GASTRIC SLEEVE RESECTION WITH HIATAL HERNIA REPAIR N/A 09/19/2015   Procedure: LAPAROSCOPIC GASTRIC SLEEVE RESECTION WITH HIATAL HERNIA REPAIR;  Surgeon: [name], MD;  Location: [facility];  Service: General;  Laterality: N/A;  . NOSE SURGERY  2004  . ovaries removed    . TUBAL LIGATION    . UPPER GI ENDOSCOPY  09/19/2015   Procedure: UPPER GI ENDOSCOPY;  Surgeon: [name], MD;  Location: [facility];  Service: General;;   Social History   Social History Narrative   Disabled   Prior enodoscopy technician [employer]   family history includes Allergies in her child and mother; Anxiety disorder in her mother; Asthma in her brother, brother, mother, son, and son; COPD in her brother and sister; Cancer in her maternal grandfather; Depression in her mother; Diabetes in her mother; Emphysema in her mother; Heart attack in her brother and paternal grandfather; Heart disease in her father, maternal grandmother, mother, and paternal grandmother; High Cholesterol in her father and mother; High blood pressure in her father; Hypertension in her brother, daughter, and mother; Hyperthyroidism in her sister; Lung cancer in her maternal aunt; Multiple sclerosis in her paternal grandmother; Ovarian cancer in her mother; Stroke in her maternal grandmother and mother; Sudden death in her father and sister; Thyroid disease in her brother and mother.   Review of Systems As per HPI All other ROS negative  Objective:   Physical Exam BP 120/80   Pulse 80   Ht _0  (1.6 m)   Wt 246 lb 9.6 oz (111.9 kg)   BMI 43.68 kg/m  Obese, NAD Anicteric eyes Lungs cta Cor s1s2 no rmg abd obese NT, S+ Patti [place], CMA present.  Rectal:  Anoderm inspection revealed perianal dermatitis on the perianal skin  Anal wink was  + Digital exam revealed normal resting tone and voluntary squeeze, ? Sl decreased No mass or rectocele present. Simulated defecation with valsalva revealed appropriate abdominal contraction and descent.    Mood - NL  Alert and oriented x 3

## 2018-01-28 NOTE — Patient Instructions (Signed)
  We have sent the following medications to your pharmacy for you to pick up at your convenience: Dicyclomine, Nystatin oint.  Use over the counter Gaviscon as needed for the acid reflux.   You have been scheduled for an endoscopy and colonoscopy. Please follow the written instructions given to you at your visit today. Please use the Clenpiq sample we have given you today to prep. If you use inhalers (even only as needed), please bring them with you on the day of your procedure. Your physician has requested that you go to www.startemmi.com and enter the access code given to you at your visit today. This web site gives a general overview about your procedure. However, you should still follow specific instructions given to you by our office regarding your preparation for the procedure.  Normal BMI (Body Mass Index- based on height and weight) is between 19 and 25. Your BMI today is Body mass index is 43.68 kg/m. [name] Please consider follow up  regarding your BMI with your Primary Care Provider.  I appreciate the opportunity to care for you. Silvano Rusk, MD, [facility]

## 2018-02-02 ENCOUNTER — Encounter: Payer: Self-pay | Admitting: Internal Medicine

## 2018-02-04 ENCOUNTER — Ambulatory Visit (INDEPENDENT_AMBULATORY_CARE_PROVIDER_SITE_OTHER): Payer: Medicare Other | Admitting: Family Medicine

## 2018-02-04 VITALS — BP 134/78 | HR 71 | Temp 97.9°F | Ht 63.0 in | Wt 245.0 lb

## 2018-02-04 DIAGNOSIS — Z9189 Other specified personal risk factors, not elsewhere classified: Secondary | ICD-10-CM | POA: Diagnosis not present

## 2018-02-04 DIAGNOSIS — E038 Other specified hypothyroidism: Secondary | ICD-10-CM | POA: Diagnosis not present

## 2018-02-04 DIAGNOSIS — Z6841 Body Mass Index (BMI) 40.0 and over, adult: Secondary | ICD-10-CM | POA: Diagnosis not present

## 2018-02-04 DIAGNOSIS — R7303 Prediabetes: Secondary | ICD-10-CM | POA: Diagnosis not present

## 2018-02-04 DIAGNOSIS — E559 Vitamin D deficiency, unspecified: Secondary | ICD-10-CM

## 2018-02-04 MED ORDER — LEVOTHYROXINE SODIUM 150 MCG PO TABS
150.0000 ug | ORAL_TABLET | Freq: Every day | ORAL | 0 refills | Status: DC
Start: 2018-02-04 — End: 2018-03-10

## 2018-02-04 MED ORDER — VITAMIN D (ERGOCALCIFEROL) 1.25 MG (50000 UNIT) PO CAPS: 50000.0000 [IU] | ORAL_CAPSULE | ORAL | 0 refills | Status: DC

## 2018-02-05 DIAGNOSIS — R5383 Other fatigue: Secondary | ICD-10-CM | POA: Diagnosis not present

## 2018-02-05 DIAGNOSIS — R7303 Prediabetes: Secondary | ICD-10-CM | POA: Diagnosis not present

## 2018-02-05 NOTE — Progress Notes (Signed)
Office: [phone]  /  Fax: [phone]   HPI:   Chief Complaint: OBESITY Kristina [name] is here to discuss her progress with her obesity treatment plan. She is on the Category 2 plan and is following her eating plan approximately 75 % of the time. She states she is exercising 0 minutes 0 times per week. Kristina [name] is breaking up meals to eat frequently because she is too full at meals and hungry between meals.  Her weight is 245 lb (111.1 kg) today and has had a weight loss of 3 pounds over a period of 2 weeks since her last visit. She has lost 3 lbs since starting treatment with [place].  Hypothyroidism Kristina [name] has a diagnosis of hypothyroidism. Over-replaced T3 (on armour thyroid). She is not on levothyroxine. She denies hot or cold intolerance, palpitations, or fatigue.  Vitamin D Deficiency Kristina [name] has a diagnosis of vitamin D deficiency. She is not on Vit D supplementation currently and denies nausea, vomiting or muscle weakness.  At risk for osteopenia and osteoporosis Kristina [name] is at higher risk of osteopenia and osteoporosis due to vitamin D deficiency.   Pre-Diabetes Kristina [name] has a diagnosis of pre-diabetes based on her elevated Hgb A1c and was informed this puts her at greater risk of developing diabetes. Last Hgb A1c done in Epic was prior to sleeve gastrectomy. She is not taking metformin currently and continues to work on diet and exercise to decrease risk of diabetes. She denies nausea or hypoglycemia.  ALLERGIES: Allergies  Allergen Reactions  . Ampicillin Anaphylaxis    Has patient had a PCN reaction causing immediate rash, facial/tongue/throat swelling, SOB or lightheadedness with hypotension: Yes Has patient had a PCN reaction causing severe rash involving mucus membranes or skin necrosis: Yes Has patient had a PCN reaction that required hospitalization Yes Has patient had a PCN reaction occurring within the last 10 years: No If all of the above answers are "NO", then may proceed with  Cephalosporin use.   . Codeine Hives and Shortness Of Breath  . Sulfonamide Derivatives Itching and Rash    MEDICATIONS: Current Outpatient Medications on File Prior to Visit  Medication Sig Dispense Refill  . Cholecalciferol (VITAMIN D3) 5000 units CAPS Take by mouth.    . Cyanocobalamin (B-12) 1000 MCG/ML KIT Inject as directed.    [name] dexlansoprazole (DEXILANT) 60 MG capsule Take 60 mg by mouth daily.    [name] dicyclomine (BENTYL) 20 MG tablet Take 1 tablet (20 mg total) by mouth 3 (three) times daily before meals. 90 tablet 0  . estradiol (ESTRACE) 1 MG tablet Take 2 mg by mouth daily.     [name] FLUoxetine (PROZAC) 20 MG capsule Take 40 mg by mouth at bedtime.     [name] nystatin-triamcinolone ointment (MYCOLOG) Apply 1 application topically 2 (two) times daily. 30 g 1  . progesterone (PROMETRIUM) 200 MG capsule Take 200 mg by mouth daily.    . TESTOSTERONE IL 0.2 mLs 2 (two) times a week.     . thyroid (ARMOUR) 90 MG tablet Take 120 mg by mouth daily.      No current facility-administered medications on file prior to visit.     PAST MEDICAL HISTORY: Past Medical History:  Diagnosis Date  . Allergic rhinitis   . Anxiety   . Asthma   . Bulging disc   . Cancer (HCC)    cervical cancer   . Chronic back pain   . Chronic cough   . Depression   . Diabetes mellitus  diet control only  . Diverticulosis   . Dyspnea    chronic  . Family history of adverse reaction to anesthesia    pts daughter had reaction to profolol - difficulty with breathing   . GERD (gastroesophageal reflux disease)   . High cholesterol   . History of bronchitis   . Hx of adenomatous and sessile serrated colonic polyps   . Hypertension   . Hypothyroidism   . Lumbosacral spondylosis without myelopathy    history bulging disc-chronic pain(level 3 to 8).  . Normal cardiac stress test 02/2015   with normal EF  . Numbness    feet bilat comes and goes   . Obesity   . Obstructive sleep apnea    uses c-pap at night    . Palpitations   . Pneumonia    last episode 2012  . PONV (postoperative nausea and vomiting)   . Prediabetes   . Restless leg   . Vitamin D deficiency   . Vocal cord dysfunction     PAST SURGICAL HISTORY: Past Surgical History:  Procedure Laterality Date  . ABDOMINAL HYSTERECTOMY  1996  . CHOLECYSTECTOMY  1994  . COLONOSCOPY WITH PROPOFOL N/A 03/10/2015   Procedure: COLONOSCOPY WITH PROPOFOL;  Surgeon: [name], MD;  Location: [facility];  Service: Endoscopy;  Laterality: N/A;  . ESOPHAGOGASTRODUODENOSCOPY N/A 10/27/2013   Procedure: ESOPHAGOGASTRODUODENOSCOPY (EGD);  Surgeon: [name], MD;  Location: [facility];  Service: Endoscopy;  Laterality: N/A;  . KNEE ARTHROSCOPY Right   . LAPAROSCOPIC GASTRIC SLEEVE RESECTION WITH HIATAL HERNIA REPAIR N/A 09/19/2015   Procedure: LAPAROSCOPIC GASTRIC SLEEVE RESECTION WITH HIATAL HERNIA REPAIR;  Surgeon: [name], MD;  Location: [facility];  Service: General;  Laterality: N/A;  . NOSE SURGERY  2004  . ovaries removed    . TUBAL LIGATION    . UPPER GI ENDOSCOPY  09/19/2015   Procedure: UPPER GI ENDOSCOPY;  Surgeon: [name], MD;  Location: [facility];  Service: General;;    SOCIAL HISTORY: Social History   Tobacco Use  . Smoking status: Never Smoker  . Smokeless tobacco: Never Used  Substance Use Topics  . Alcohol use: No  . Drug use: No    FAMILY HISTORY: Family History  Problem Relation Age of Onset  . Emphysema Mother   . Allergies Mother   . Heart disease Mother   . Asthma Mother   . Ovarian cancer Mother   . Hypertension Mother   . Diabetes Mother   . High Cholesterol Mother   . Stroke Mother   . Thyroid disease Mother   . Depression Mother   . Anxiety disorder Mother   . Heart disease Father        deceased at age 7 from heart attack  . Sudden death Father   . High Cholesterol Father   . High blood pressure Father   . Stroke Maternal Grandmother   . Heart disease Maternal Grandmother   . Cancer  Maternal Grandfather   . Heart disease Paternal Grandmother   . Multiple sclerosis Paternal Grandmother   . Heart attack Paternal Grandfather   . Thyroid disease Brother        died at 44  . Heart attack Brother        died at 52  . COPD Brother   . Hyperthyroidism Sister   . Hypertension Brother   . Hypertension Daughter   . Allergies Child   . Asthma Son   . Asthma Brother   .  Asthma Brother   . Lung cancer Maternal Aunt   . COPD Sister        died at 56  . Sudden death Sister   . Asthma Son     ROS: Review of Systems  Constitutional: Positive for weight loss. Negative for malaise/fatigue.  Cardiovascular: Negative for palpitations.  Gastrointestinal: Negative for nausea and vomiting.  Musculoskeletal:       Negative muscle weakness  Endo/Heme/Allergies:       Negative hot/cold intolerance Negative hypoglycemia    PHYSICAL EXAM: Blood pressure 134/78, pulse 71, temperature 97.9 F (36.6 C), temperature source Oral, height 5' 3"  (1.6 m), weight 245 lb (111.1 kg), SpO2 97 %. Body mass index is 43.4 kg/m. Physical Exam  Constitutional: She is oriented to person, place, and time. She appears well-developed and well-nourished.  Cardiovascular: Normal rate.  Pulmonary/Chest: Effort normal.  Musculoskeletal: Normal range of motion.  Neurological: She is oriented to person, place, and time.  Skin: Skin is warm and dry.  Psychiatric: She has a normal mood and affect. Her behavior is normal.  Vitals reviewed.   RECENT LABS AND TESTS: BMET    Component Value Date/Time   NA 143 11/10/2015 0527   K 3.9 11/10/2015 0527   CL 112 (H) 11/10/2015 0527   CO2 25 11/10/2015 0527   GLUCOSE 84 11/10/2015 0527   BUN 5 (L) 11/10/2015 0527   CREATININE 0.62 11/10/2015 0527   CREATININE 0.88 02/02/2015 1458   CREATININE 0.88 02/02/2015 1458   CALCIUM 8.3 (L) 11/10/2015 0527   GFRNONAA >60 11/10/2015 0527   GFRAA >60 11/10/2015 0527   Lab Results  Component Value Date    HGBA1C 6.3 (H) 09/14/2015   HGBA1C (H) 02/12/2011    6.2 (NOTE)                                                                       According to the ADA Clinical Practice Recommendations for 2011, when HbA1c is used as a screening test:   >=6.5%   Diagnostic of Diabetes Mellitus           (if abnormal result  is confirmed)  5.7-6.4%   Increased risk of developing Diabetes Mellitus  References:Diagnosis and Classification of Diabetes Mellitus,Diabetes KXFG,1829,93(ZJIRC 1):S62-S69 and Standards of Medical Care in         Diabetes - 2011,Diabetes Care,2011,34  (Suppl 1):S11-S61.   HGBA1C 6.2 (H) 02/02/2008   Lab Results  Component Value Date   INSULIN 11.0 01/21/2018   CBC    Component Value Date/Time   WBC 3.8 (L) 11/10/2015 0527   RBC 4.46 11/10/2015 0527   HGB 12.2 11/10/2015 0527   HCT 39.0 11/10/2015 0527   PLT 121 (L) 11/10/2015 0527   MCV 87.4 11/10/2015 0527   MCH 27.4 11/10/2015 0527   MCHC 31.3 11/10/2015 0527   RDW 15.3 11/10/2015 0527   LYMPHSABS 2.0 11/09/2015 0515   MONOABS 0.8 11/09/2015 0515   EOSABS 0.1 11/09/2015 0515   BASOSABS 0.0 11/09/2015 0515   Iron/TIBC/Ferritin/ %Sat No results found for: IRON, TIBC, FERRITIN, IRONPCTSAT Lipid Panel     Component Value Date/Time   CHOL  02/12/2008 0540    152        ATP  III CLASSIFICATION:  <200     mg/dL   Desirable  200-239  mg/dL   Borderline High  >=240    mg/dL   High   TRIG 126 02/12/2008 0540   HDL 34 (L) 02/12/2008 0540   CHOLHDL 4.5 02/12/2008 0540   VLDL 25 02/12/2008 0540   LDLCALC  02/12/2008 0540    93        Total Cholesterol/HDL:CHD Risk Coronary Heart Disease Risk Table                     Men   Women  1/2 Average Risk   3.4   3.3   Hepatic Function Panel     Component Value Date/Time   PROT 7.6 11/09/2015 0515   ALBUMIN 4.4 11/09/2015 0515   AST 28 11/09/2015 0515   ALT 37 11/09/2015 0515   ALKPHOS 102 11/09/2015 0515   BILITOT 0.5 11/09/2015 0515      Component Value Date/Time     TSH 1.180 01/21/2018 1056  Results for LASHAWNTA, BURGERT (MRN [id number]) as of 02/05/2018 10:32  Ref. Range 01/21/2018 10:56  Vitamin D, 25-Hydroxy Latest Ref Range: 30.0 - 100.0 ng/mL 28.8 (L)    ASSESSMENT AND PLAN: Other specified hypothyroidism - Plan: levothyroxine (SYNTHROID, LEVOTHROID) 150 MCG tablet  Vitamin D deficiency - Plan: Vitamin D, Ergocalciferol, (DRISDOL) 50000 units CAPS capsule  Prediabetes  At risk for osteoporosis  Class 3 severe obesity with serious comorbidity and body mass index (BMI) of 40.0 to 44.9 in adult, unspecified obesity type Kristina [name])  PLAN:  Hypothyroidism Kristina [name] was informed of the importance of good thyroid control to help with weight loss efforts. She was also informed that supertheraputic thyroid levels are dangerous and will not improve weight loss results. Brylie agrees to start levothyroxine 150 mcg PO q AM #30 with no refills. We will retest thyroid in 6 weeks and Shawndell agrees to follow up with our clinic in 2 weeks.  Vitamin D Deficiency Kristina [name] was informed that low vitamin D levels contributes to fatigue and are associated with obesity, breast, and colon cancer. Kristina [name] agrees to start prescription Vit D @50 ,000 IU every week #4 with no refills. She will follow up for routine testing of vitamin D, at least 2-3 times per year. She was informed of the risk of over-replacement of vitamin D and agrees to not increase her dose unless she discusses this with [place] first. Kristina [name] agrees to follow up with our clinic in 2 weeks.  At risk for osteopenia and osteoporosis Kristina [name] is at risk for osteopenia and osteoporsis due to her vitamin D deficiency. She was encouraged to take her vitamin D and follow her higher calcium diet and increase strengthening exercise to help strengthen her bones and decrease her risk of osteopenia and osteoporosis.  Pre-Diabetes Kristina [name] will continue to work on weight loss, exercise, and decreasing simple carbohydrates in her diet to help  decrease the risk of diabetes. We dicussed metformin including benefits and risks. She was informed that eating too many simple carbohydrates or too many calories at one sitting increases the likelihood of GI side effects. Kristina [name] declined metformin for now and a prescription was not written today. Hgb A1c to be tomorrow and Kristina [name] agrees to follow up with our clinic in 2 weeks as directed to monitor her progress.  Obesity Kristina [name] is currently in the action stage of change. As such, her goal is to continue with weight loss efforts She has agreed to follow the  Category 2 plan Kristina [name] has been instructed to work up to a goal of 150 minutes of combined cardio and strengthening exercise per week for weight loss and overall health benefits. We discussed the following Behavioral Modification Strategies today: increasing lean protein intake, increasing vegetables, work on meal planning and easy cooking plans, better snacking choices, and planning for success   Kristina [name] has agreed to follow up with our clinic in 2 weeks. She was informed of the importance of frequent follow up visits to maximize her success with intensive lifestyle modifications for her multiple health conditions.   OBESITY BEHAVIORAL INTERVENTION VISIT  Today's visit was # 2 out of 22.  Starting weight: 248 lbs Starting date: 01/21/18 Today's weight : 245 lbs  Today's date: 02/04/2018 Total lbs lost to date: 3 (Patients must lose 7 lbs in the first 6 months to continue with counseling)   ASK: We discussed the diagnosis of obesity with Kristina [name] today and Kristina [name] agreed to give [place] permission to discuss obesity behavioral modification therapy today.  ASSESS: Kristina [name] has the diagnosis of obesity and her BMI today is 43.41 Kristina [name] is in the action stage of change   ADVISE: Kristina [name] was educated on the multiple health risks of obesity as well as the benefit of weight loss to improve her health. She was advised of the need for long term  treatment and the importance of lifestyle modifications.  AGREE: Multiple dietary modification options and treatment options were discussed and  Kristina [name] agreed to the above obesity treatment plan.  I, Trixie Dredge, am acting as transcriptionist for Ilene Qua, MD  I have reviewed the above documentation for accuracy and completeness, and I agree with the above. - Ilene Qua, MD

## 2018-02-06 LAB — HEMOGLOBIN A1C
ESTIMATED AVERAGE GLUCOSE: 108 mg/dL
Hgb A1c MFr Bld: 5.4 % (ref 4.8–5.6)

## 2018-02-06 LAB — COMPREHENSIVE METABOLIC PANEL
A/G RATIO: 1.6 (ref 1.2–2.2)
ALT: 11 IU/L (ref 0–32)
AST: 16 IU/L (ref 0–40)
Albumin: 4.2 g/dL (ref 3.5–5.5)
Alkaline Phosphatase: 110 IU/L (ref 39–117)
BILIRUBIN TOTAL: 0.3 mg/dL (ref 0.0–1.2)
BUN/Creatinine Ratio: 16 (ref 9–23)
BUN: 10 mg/dL (ref 6–24)
CALCIUM: 9.3 mg/dL (ref 8.7–10.2)
CHLORIDE: 105 mmol/L (ref 96–106)
CO2: 22 mmol/L (ref 20–29)
Creatinine, Ser: 0.62 mg/dL (ref 0.57–1.00)
GFR calc Af Amer: 125 mL/min/{1.73_m2} (ref >59)
GFR, EST NON AFRICAN AMERICAN: 108 mL/min/{1.73_m2} (ref >59)
GLOBULIN, TOTAL: 2.6 g/dL (ref 1.5–4.5)
Glucose: 91 mg/dL (ref 65–99)
POTASSIUM: 4.5 mmol/L (ref 3.5–5.2)
SODIUM: 140 mmol/L (ref 134–144)
Total Protein: 6.8 g/dL (ref 6.0–8.5)

## 2018-02-06 LAB — CBC WITH DIFFERENTIAL
BASOS: 1 %
Basophils Absolute: 0 10*3/uL (ref 0.0–0.2)
EOS (ABSOLUTE): 0.1 10*3/uL (ref 0.0–0.4)
Eos: 2 %
Hematocrit: 37.3 % (ref 34.0–46.6)
Hemoglobin: 12.4 g/dL (ref 11.1–15.9)
Immature Grans (Abs): 0 10*3/uL (ref 0.0–0.1)
Immature Granulocytes: 0 %
LYMPHS ABS: 2.6 10*3/uL (ref 0.7–3.1)
Lymphs: 46 %
MCH: 28 pg (ref 26.6–33.0)
MCHC: 33.2 g/dL (ref 31.5–35.7)
MCV: 84 fL (ref 79–97)
MONOS ABS: 0.5 10*3/uL (ref 0.1–0.9)
Monocytes: 8 %
NEUTROS PCT: 43 %
Neutrophils Absolute: 2.5 10*3/uL (ref 1.4–7.0)
RBC: 4.43 x10E6/uL (ref 3.77–5.28)
RDW: 13.9 % (ref 12.3–15.4)
WBC: 5.8 10*3/uL (ref 3.4–10.8)

## 2018-02-06 LAB — LIPID PANEL WITH LDL/HDL RATIO
CHOLESTEROL TOTAL: 233 mg/dL — AB (ref 100–199)
HDL: 51 mg/dL (ref >39)
LDL Calculated: 151 mg/dL — ABNORMAL HIGH (ref 0–99)
LDl/HDL Ratio: 3 ratio (ref 0.0–3.2)
Triglycerides: 157 mg/dL — ABNORMAL HIGH (ref 0–149)
VLDL Cholesterol Cal: 31 mg/dL (ref 5–40)

## 2018-02-10 ENCOUNTER — Encounter: Payer: Self-pay | Admitting: Internal Medicine

## 2018-02-10 ENCOUNTER — Encounter (INDEPENDENT_AMBULATORY_CARE_PROVIDER_SITE_OTHER): Payer: Self-pay | Admitting: Family Medicine

## 2018-02-11 DIAGNOSIS — E538 Deficiency of other specified B group vitamins: Secondary | ICD-10-CM | POA: Diagnosis not present

## 2018-02-25 ENCOUNTER — Other Ambulatory Visit (INDEPENDENT_AMBULATORY_CARE_PROVIDER_SITE_OTHER): Payer: Self-pay | Admitting: Family Medicine

## 2018-02-25 DIAGNOSIS — E559 Vitamin D deficiency, unspecified: Secondary | ICD-10-CM

## 2018-03-02 ENCOUNTER — Ambulatory Visit (INDEPENDENT_AMBULATORY_CARE_PROVIDER_SITE_OTHER): Payer: Medicare Other | Admitting: Family Medicine

## 2018-03-02 VITALS — BP 114/75 | HR 77 | Temp 98.2°F | Ht 63.0 in | Wt 243.0 lb

## 2018-03-02 DIAGNOSIS — F3289 Other specified depressive episodes: Secondary | ICD-10-CM

## 2018-03-02 DIAGNOSIS — Z6841 Body Mass Index (BMI) 40.0 and over, adult: Secondary | ICD-10-CM

## 2018-03-02 DIAGNOSIS — E559 Vitamin D deficiency, unspecified: Secondary | ICD-10-CM

## 2018-03-02 DIAGNOSIS — E038 Other specified hypothyroidism: Secondary | ICD-10-CM

## 2018-03-02 MED ORDER — BUPROPION HCL ER (SR) 150 MG PO TB12: 150.0000 mg | ORAL_TABLET | Freq: Every day | ORAL | 0 refills | Status: DC

## 2018-03-02 MED ORDER — VITAMIN D (ERGOCALCIFEROL) 1.25 MG (50000 UNIT) PO CAPS: 50000.0000 [IU] | ORAL_CAPSULE | ORAL | 0 refills | Status: DC

## 2018-03-03 NOTE — Progress Notes (Signed)
Office: [phone]  /  Fax: [phone]   HPI:   Chief Complaint: OBESITY Kristina [name] is here to discuss her progress with her obesity treatment plan. She is on the Category 2 plan and is following her eating plan approximately 40 % of the time. She states she is exercising 0 minutes 0 times per week. Kristina [name] struggled taking medications that she was changed to given increase in fatigue. Had some emotional eating given 1 year anniversary of mother's death.  Her weight is 243 lb (110.2 kg) today and has had a weight loss of 2 pounds over a period of 3 to 4 weeks since her last visit. She has lost 5 lbs since starting treatment with [place].  Vitamin D Deficiency Kristina [name] has a diagnosis of vitamin D deficiency. She is currently taking prescription Vit D and denies nausea, vomiting or muscle weakness.  Hypothyroidism Kristina [name] has a diagnosis of hypothyroidism. She is on levothyroxine. Last thyroid panel showing an elevation in T3. She denies hot or cold intolerance or palpitations, but does admit to ongoing fatigue.  Depression with emotional eating behaviors Kristina [name] is struggling with emotional eating and using food for comfort to the extent that it is negatively impacting her health. She often snacks when she is not hungry. Kristina [name] sometimes feels she is out of control and then feels guilty that she made poor food choices. She has been working on behavior modification techniques to help reduce her emotional eating and has been somewhat successful. She has no history of seizures. She shows no sign of suicidal or homicidal ideations.  Depression screen Kristina [name] 2/9 01/21/2018 04/19/2016 01/24/2016 11/20/2015 10/03/2015  Decreased Interest 3 0 0 0 0  Down, Depressed, Hopeless 3 0 0 0 0  PHQ - 2 Score 6 0 0 0 0  Altered sleeping 2 - - - -  Tired, decreased energy 3 - - - -  Change in appetite 3 - - - -  Feeling bad or failure about yourself  3 - - - -  Trouble concentrating 1 - - - -  Moving slowly or fidgety/restless 1  - - - -  Suicidal thoughts 0 - - - -  PHQ-9 Score 19 - - - -  Difficult doing work/chores Very difficult - - - -    ALLERGIES: Allergies  Allergen Reactions  . Ampicillin Anaphylaxis    Has patient had a PCN reaction causing immediate rash, facial/tongue/throat swelling, SOB or lightheadedness with hypotension: Yes Has patient had a PCN reaction causing severe rash involving mucus membranes or skin necrosis: Yes Has patient had a PCN reaction that required hospitalization Yes Has patient had a PCN reaction occurring within the last 10 years: No If all of the above answers are "NO", then may proceed with Cephalosporin use.   . Codeine Hives and Shortness Of Breath  . Sulfonamide Derivatives Itching and Rash    MEDICATIONS: Current Outpatient Medications on File Prior to Visit  Medication Sig Dispense Refill  . dexlansoprazole (DEXILANT) 60 MG capsule Take 60 mg by mouth daily.    [name] FLUoxetine (PROZAC) 20 MG capsule Take 20 mg by mouth at bedtime.     [name] levothyroxine (SYNTHROID, LEVOTHROID) 150 MCG tablet Take 1 tablet (150 mcg total) by mouth daily. 30 tablet 0  . nystatin-triamcinolone ointment (MYCOLOG) Apply 1 application topically 2 (two) times daily. 30 g 1  . thyroid (ARMOUR) 90 MG tablet Take 120 mg by mouth daily.      No current facility-administered medications on file  prior to visit.     PAST MEDICAL HISTORY: Past Medical History:  Diagnosis Date  . Allergic rhinitis   . Anxiety   . Asthma   . Bulging disc   . Cancer (HCC)    cervical cancer   . Chronic back pain   . Chronic cough   . Depression   . Diabetes mellitus    diet control only  . Diverticulosis   . Dyspnea    chronic  . Family history of adverse reaction to anesthesia    pts daughter had reaction to profolol - difficulty with breathing   . GERD (gastroesophageal reflux disease)   . High cholesterol   . History of bronchitis   . Hx of adenomatous and sessile serrated colonic polyps   .  Hypertension   . Hypothyroidism   . Lumbosacral spondylosis without myelopathy    history bulging disc-chronic pain(level 3 to 8).  . Normal cardiac stress test 02/2015   with normal EF  . Numbness    feet bilat comes and goes   . Obesity   . Obstructive sleep apnea    uses c-pap at night  . Palpitations   . Pneumonia    last episode 2012  . PONV (postoperative nausea and vomiting)   . Prediabetes   . Restless leg   . Vitamin D deficiency   . Vocal cord dysfunction     PAST SURGICAL HISTORY: Past Surgical History:  Procedure Laterality Date  . ABDOMINAL HYSTERECTOMY  1996  . CHOLECYSTECTOMY  1994  . COLONOSCOPY WITH PROPOFOL N/A 03/10/2015   Procedure: COLONOSCOPY WITH PROPOFOL;  Surgeon: [name], MD;  Location: [facility];  Service: Endoscopy;  Laterality: N/A;  . ESOPHAGOGASTRODUODENOSCOPY N/A 10/27/2013   Procedure: ESOPHAGOGASTRODUODENOSCOPY (EGD);  Surgeon: [name], MD;  Location: [facility];  Service: Endoscopy;  Laterality: N/A;  . KNEE ARTHROSCOPY Right   . LAPAROSCOPIC GASTRIC SLEEVE RESECTION WITH HIATAL HERNIA REPAIR N/A 09/19/2015   Procedure: LAPAROSCOPIC GASTRIC SLEEVE RESECTION WITH HIATAL HERNIA REPAIR;  Surgeon: [name], MD;  Location: [facility];  Service: General;  Laterality: N/A;  . NOSE SURGERY  2004  . ovaries removed    . TUBAL LIGATION    . UPPER GI ENDOSCOPY  09/19/2015   Procedure: UPPER GI ENDOSCOPY;  Surgeon: [name], MD;  Location: [facility];  Service: General;;    SOCIAL HISTORY: Social History   Tobacco Use  . Smoking status: Never Smoker  . Smokeless tobacco: Never Used  Substance Use Topics  . Alcohol use: No  . Drug use: No    FAMILY HISTORY: Family History  Problem Relation Age of Onset  . Emphysema Mother   . Allergies Mother   . Heart disease Mother   . Asthma Mother   . Ovarian cancer Mother   . Hypertension Mother   . Diabetes Mother   . High Cholesterol Mother   . Stroke Mother   . Thyroid disease  Mother   . Depression Mother   . Anxiety disorder Mother   . Heart disease Father        deceased at age 35 from heart attack  . Sudden death Father   . High Cholesterol Father   . High blood pressure Father   . Stroke Maternal Grandmother   . Heart disease Maternal Grandmother   . Cancer Maternal Grandfather   . Heart disease Paternal Grandmother   . Multiple sclerosis Paternal Grandmother   . Heart attack Paternal Grandfather   .  Thyroid disease Brother        died at 32  . Heart attack Brother        died at 31  . COPD Brother   . Hyperthyroidism Sister   . Hypertension Brother   . Hypertension Daughter   . Allergies Child   . Asthma Son   . Asthma Brother   . Asthma Brother   . Lung cancer Maternal Aunt   . COPD Sister        died at 32  . Sudden death Sister   . Asthma Son     ROS: Review of Systems  Constitutional: Positive for malaise/fatigue and weight loss.  Cardiovascular: Negative for palpitations.  Gastrointestinal: Negative for nausea and vomiting.  Musculoskeletal:       Negative muscle weakness  Neurological: Negative for seizures.  Endo/Heme/Allergies:       Negative hot/cold intolerance  Psychiatric/Behavioral: Positive for depression. Negative for suicidal ideas.    PHYSICAL EXAM: Blood pressure 114/75, pulse 77, temperature 98.2 F (36.8 C), temperature source Oral, height 5\' 3"  (1.6 m), weight 243 lb (110.2 kg), SpO2 94 %. Body mass index is 43.05 kg/m. Physical Exam  Constitutional: She is oriented to person, place, and time. She appears well-developed and well-nourished.  Cardiovascular: Normal rate.  Pulmonary/Chest: Effort normal.  Musculoskeletal: Normal range of motion.  Neurological: She is oriented to person, place, and time.  Skin: Skin is warm and dry.  Psychiatric: She has a normal mood and affect. Her behavior is normal.  Vitals reviewed.   RECENT LABS AND TESTS: BMET    Component Value Date/Time   NA 140 02/05/2018  1055   K 4.5 02/05/2018 1055   CL 105 02/05/2018 1055   CO2 22 02/05/2018 1055   GLUCOSE 91 02/05/2018 1055   GLUCOSE 84 11/10/2015 0527   BUN 10 02/05/2018 1055   CREATININE 0.62 02/05/2018 1055   CREATININE 0.88 02/02/2015 1458   CREATININE 0.88 02/02/2015 1458   CALCIUM 9.3 02/05/2018 1055   GFRNONAA 108 02/05/2018 1055   GFRAA 125 02/05/2018 1055   Lab Results  Component Value Date   HGBA1C 5.4 02/05/2018   HGBA1C 6.3 (H) 09/14/2015   HGBA1C (H) 02/12/2011    6.2 (NOTE)                                                                       According to the ADA Clinical Practice Recommendations for 2011, when HbA1c is used as a screening test:   >=6.5%   Diagnostic of Diabetes Mellitus           (if abnormal result  is confirmed)  5.7-6.4%   Increased risk of developing Diabetes Mellitus  References:Diagnosis and Classification of Diabetes Mellitus,Diabetes Kristina [name],6789,38(BOFBP 1):S62-S69 and Standards of Medical Care in         Diabetes - 2011,Diabetes Care,2011,34  (Suppl 1):S11-S61.   HGBA1C 6.2 (H) 02/02/2008   Lab Results  Component Value Date   INSULIN 11.0 01/21/2018   CBC    Component Value Date/Time   WBC 5.8 02/05/2018 1055   WBC 3.8 (L) 11/10/2015 0527   RBC 4.43 02/05/2018 1055   RBC 4.46 11/10/2015 0527   HGB 12.4 02/05/2018 1055   HCT 37.3  02/05/2018 1055   PLT 121 (L) 11/10/2015 0527   MCV 84 02/05/2018 1055   MCH 28.0 02/05/2018 1055   MCH 27.4 11/10/2015 0527   MCHC 33.2 02/05/2018 1055   MCHC 31.3 11/10/2015 0527   RDW 13.9 02/05/2018 1055   LYMPHSABS 2.6 02/05/2018 1055   MONOABS 0.8 11/09/2015 0515   EOSABS 0.1 02/05/2018 1055   BASOSABS 0.0 02/05/2018 1055   Iron/TIBC/Ferritin/ %Sat No results found for: IRON, TIBC, FERRITIN, IRONPCTSAT Lipid Panel     Component Value Date/Time   CHOL 233 (H) 02/05/2018 1055   TRIG 157 (H) 02/05/2018 1055   HDL 51 02/05/2018 1055   CHOLHDL 4.5 02/12/2008 0540   VLDL 25 02/12/2008 0540   LDLCALC 151 (H)  02/05/2018 1055   Hepatic Function Panel     Component Value Date/Time   PROT 6.8 02/05/2018 1055   ALBUMIN 4.2 02/05/2018 1055   AST 16 02/05/2018 1055   ALT 11 02/05/2018 1055   ALKPHOS 110 02/05/2018 1055   BILITOT 0.3 02/05/2018 1055      Component Value Date/Time   TSH 1.180 01/21/2018 1056  Results for Kristina [name], Kristina [name] (MRN [id number]) as of 03/03/2018 08:38  Ref. Range 01/21/2018 10:56  Vitamin D, 25-Hydroxy Latest Ref Range: 30.0 - 100.0 ng/mL 28.8 (L)    ASSESSMENT AND PLAN: Vitamin D deficiency - Plan: Vitamin D, Ergocalciferol, (DRISDOL) 50000 units CAPS capsule  Other specified hypothyroidism  Other depression - with emotional eating - Plan: buPROPion (WELLBUTRIN SR) 150 MG 12 hr tablet  Class 3 severe obesity with serious comorbidity and body mass index (BMI) of 40.0 to 44.9 in adult, unspecified obesity type (HCC)  PLAN:  Vitamin D Deficiency Kristina [name] was informed that low vitamin D levels contributes to fatigue and are associated with obesity, breast, and colon cancer. Kristina [name] agrees to continue taking prescription Vit D @50 ,000 IU every week #4 and we will refill for 1 month. She will follow up for routine testing of vitamin D, at least 2-3 times per year. She was informed of the risk of over-replacement of vitamin D and agrees to not increase her dose unless she discusses this with [place] first. Chaselynn agrees to follow up with our clinic in 2 weeks.  Hypothyroidism Kristina [name] was informed of the importance of good thyroid control to help with weight loss efforts. She was also informed that supertheraputic thyroid levels are dangerous and will not improve weight loss results. We will repeat thyroid at next visit and Kristina [name] agrees to follow up with our clinic in 2 weeks.  Depression with Emotional Eating Behaviors We discussed behavior modification techniques today to help Kristina [name] deal with her emotional eating and depression. We will refer to Dr. Mallie Mussel for evaluation; Kristina [name] agrees  to decrease Prozac to 20 mg PO qhs #30, with no refills, and she agrees to start Wellbutrin SR 150 mg PO q AM #30 with no refills. Kristina [name] agrees to follow up with our clinic in 2 weeks.  Obesity Kristina [name] is currently in the action stage of change. As such, her goal is to continue with weight loss efforts She has agreed to follow the Category 2 plan Kristina [name] has been instructed to work up to a goal of 150 minutes of combined cardio and strengthening exercise per week for weight loss and overall health benefits. We discussed the following Behavioral Modification Strategies today: increasing lean protein intake, increasing vegetables, work on meal planning and easy cooking plans, and planning for success    Kristina [name] has agreed  to follow up with our clinic in 2 weeks. She was informed of the importance of frequent follow up visits to maximize her success with intensive lifestyle modifications for her multiple health conditions.   OBESITY BEHAVIORAL INTERVENTION VISIT  Today's visit was # 3 out of 22.  Starting weight: 248 lbs Starting date: 01/21/18 Today's weight : 243 lbs Today's date: 03/02/2018 Total lbs lost to date: 5 (Patients must lose 7 lbs in the first 6 months to continue with counseling)   ASK: We discussed the diagnosis of obesity with Kristina [name] today and Kristina [name] agreed to give [place] permission to discuss obesity behavioral modification therapy today.  ASSESS: Kristina [name] has the diagnosis of obesity and her BMI today is 43.06 Kristina [name] is in the action stage of change   ADVISE: Kristina [name] was educated on the multiple health risks of obesity as well as the benefit of weight loss to improve her health. She was advised of the need for long term treatment and the importance of lifestyle modifications.  AGREE: Multiple dietary modification options and treatment options were discussed and  Kristina [name] agreed to the above obesity treatment plan.  I, Trixie Dredge, am acting as transcriptionist for  Ilene Qua, MD  I have reviewed the above documentation for accuracy and completeness, and I agree with the above. - Ilene Qua, MD

## 2018-03-04 ENCOUNTER — Encounter (INDEPENDENT_AMBULATORY_CARE_PROVIDER_SITE_OTHER): Payer: Self-pay | Admitting: Family Medicine

## 2018-03-04 DIAGNOSIS — I10 Essential (primary) hypertension: Secondary | ICD-10-CM | POA: Diagnosis not present

## 2018-03-04 DIAGNOSIS — E039 Hypothyroidism, unspecified: Secondary | ICD-10-CM | POA: Diagnosis not present

## 2018-03-10 ENCOUNTER — Other Ambulatory Visit (INDEPENDENT_AMBULATORY_CARE_PROVIDER_SITE_OTHER): Payer: Self-pay | Admitting: Family Medicine

## 2018-03-10 DIAGNOSIS — E038 Other specified hypothyroidism: Secondary | ICD-10-CM

## 2018-03-11 NOTE — Progress Notes (Unsigned)
Office: [phone]  /  Fax: [phone]  Date: March 16, 2018 Time Seen:*** Duration:*** Provider: Glennie Isle, PsyD Type of Session: Intake for Individual Therapy   Informed Consent:The provider's role was explained to Kristina [name]. The provider discussed issues of confidentiality, privacy, and limits therein; anticipated course of treatment; potential risks involved with psychotherapy; voluntary nature of treatment; and the clinic's cancellation policy. In addition to written consent, verbal informed consent for psychological services was also obtained from Kristina [name] prior to initial intake interview.   Kristina [name] was informed that information about mental health appointments will be entered in the medical record at Kristina [name]) via Epic. Kristina [name] agreed information may be shared with other [facility] providers as needed for coordination of care. Written consent was also provided for this provider to coordinate care with other providers at [facility].The provider also explained leaving voicemail messages and MyChart can be utilized for non-emergency reasons and limits of confidentiality related to communication via technology was discussed. Kristina [name] was also informed the clinic is not a 24/7 crisis center and mental health emergency resources were shared. A handout with emergency resources was also provided. Kristina [name] verbally acknowledged understanding and also agreed to use mental health emergency resources discussed if needed.   Chief Complaint: Kristina [name] was referred by Kristina [name] due to depression with emotional eating behaviors. Per the note for the office visit with Dr. Adair [name] on March 02, 2018, "Kristina [name] is struggling with emotional eating and using food for comfort to the extent that it is negatively impacting her health. She often snacks when she is not hungry. Kristina [name] sometimes feels she is out of control and then feels guilty that she made  poor food choices. She has been working on behavior modification techniques to help reduce her emotional eating and has been somewhat successful. She has no history of seizures. She shows no sign of suicidal or homicidal ideations."  HPI: Per the note for the initial visit with Dr. Adair [name] on Jan 21, 2018, Kristina [name] has been heavy most of  her life and she started gaining weight after having children. Her heaviest weight ever was 320 pounds.   Mental Status Examination: Kristina [name] arrived on time for the appointment. She presented as appropriately dressed and groomed. Kristina [name] appeared her stated age and demonstrated adequate orientation to time, place, person, and purpose of the appointment. She also demonstrated appropriate eye contact. No psychomotor abnormalities or behavioral peculiarities noted. Her mood was *** with congruent affect. Her thought processes were logical, linear, and goal-directed. No hallucinations, delusions, bizarre thinking or behavior reported or observed. Judgment, insight, and impulse control appeared to be grossly intact. There was no evidence of paraphasias (i.e., errors in speech, gross mispronunciations, and word substitutions), repetition deficits, or disturbances in volume or prosody (i.e., rhythm and intonation). There was no evidence of attention or memory impairments. Kristina [name] denied current suicidal and homicidal ideation, plan, and intent.   The Mini-Mental State Examination, Second Edition (MMSE-2) was administered. The MMSE-2 briefly screens for cognitive dysfunction and overall mental status and assesses different cognitive domains: orientation, registration, attention and calculation, recall, and language and praxis. Kristina [name] received *** out of 30 points possible on the MMSE-2, which is noted in the *** range. The following points were lost: ***  Family & Psychosocial History: ***  Medical History: ***  Mental Health History: ***  Structured Assessment Results: The Patient  Health Questionnaire-9 (PHQ-9) is a self-report measure that assesses symptoms and severity  of depression over the course of the last two weeks. Kristina [name] obtained a score of *** suggesting *** depression. The following items were endorsed: ***. Kristina [name] finds the aforementioned symptoms to be ***.  The Generalized Anxiety Disorder-7 (GAD-7) is a brief self-report measure that assesses symptoms of anxiety over the course of the last two weeks. Kristina [name] obtained a score of *** suggesting ***[no/minimal/mild/moderate/severe] anxiety. The following items were endorsed: ***.  Kristina [name] finds the aforementioned symptoms to be ***[not difficult at all/somewhat difficult/very difficult/extremely difficult].  Interventions: A chart review was conducted prior to the clinical intake interview. The MMSE-2, PHQ-9, and GAD-7 were administered and a clinical intake interview was completed. Throughout session, empathic reflections and validation was provided. Continuing treatment with this provider was discussed and treatment goals were established.  Provisional DSM-5 Diagnosis:***  Plan: Kristina [name] expressed understanding and agreement with the initial treatment plan of care. She appears able and willing to participate as evidenced by collaboration on treatment goals, engagement in reciprocal conversation, and asking questions as needed for clarification. The next appointment will be scheduled in *** week(s). The following treatment goals were established: ***.

## 2018-03-12 ENCOUNTER — Encounter (INDEPENDENT_AMBULATORY_CARE_PROVIDER_SITE_OTHER): Payer: Self-pay

## 2018-03-16 ENCOUNTER — Ambulatory Visit (INDEPENDENT_AMBULATORY_CARE_PROVIDER_SITE_OTHER): Payer: Medicare Other | Admitting: Psychology

## 2018-03-16 ENCOUNTER — Encounter (INDEPENDENT_AMBULATORY_CARE_PROVIDER_SITE_OTHER): Payer: Self-pay | Admitting: Family Medicine

## 2018-03-16 ENCOUNTER — Ambulatory Visit (INDEPENDENT_AMBULATORY_CARE_PROVIDER_SITE_OTHER): Payer: Medicare Other | Admitting: Family Medicine

## 2018-03-16 VITALS — BP 103/67 | HR 74 | Temp 97.8°F | Ht 63.0 in | Wt 243.0 lb

## 2018-03-16 DIAGNOSIS — Z6841 Body Mass Index (BMI) 40.0 and over, adult: Secondary | ICD-10-CM | POA: Diagnosis not present

## 2018-03-16 DIAGNOSIS — E038 Other specified hypothyroidism: Secondary | ICD-10-CM | POA: Diagnosis not present

## 2018-03-16 DIAGNOSIS — E8881 Metabolic syndrome: Secondary | ICD-10-CM

## 2018-03-16 DIAGNOSIS — E559 Vitamin D deficiency, unspecified: Secondary | ICD-10-CM | POA: Diagnosis not present

## 2018-03-16 DIAGNOSIS — F3289 Other specified depressive episodes: Secondary | ICD-10-CM | POA: Diagnosis not present

## 2018-03-16 MED ORDER — VITAMIN D (ERGOCALCIFEROL) 1.25 MG (50000 UNIT) PO CAPS: 50000.0000 [IU] | ORAL_CAPSULE | ORAL | 0 refills | Status: DC

## 2018-03-17 LAB — T4, FREE: Free T4: 1.47 ng/dL (ref 0.82–1.77)

## 2018-03-17 LAB — T3: T3 TOTAL: 111 ng/dL (ref 71–180)

## 2018-03-17 LAB — TSH: TSH: 0.329 u[IU]/mL — ABNORMAL LOW (ref 0.450–4.500)

## 2018-03-17 MED ORDER — FLUOXETINE HCL 20 MG PO CAPS: 20.0000 mg | ORAL_CAPSULE | Freq: Every day | ORAL | 0 refills | Status: DC

## 2018-03-17 MED ORDER — METFORMIN HCL 500 MG PO TABS
500.0000 mg | ORAL_TABLET | Freq: Every day | ORAL | 0 refills | Status: DC
Start: 2018-03-17 — End: 2018-04-14

## 2018-03-17 NOTE — Progress Notes (Signed)
Office: [phone]  /  Fax: [phone]   HPI:   Chief Complaint: OBESITY Kristina [name] is here to discuss her progress with her obesity treatment plan. She is on the Category 2 plan and is following her eating plan approximately 80 % of the time. She states she is exercising 0 minutes 0 times per week. [name] notes hunger at 3-4 pm (eating portion of dinner at that time), getting sick from eggs in the morning.  Her weight is 243 lb (110.2 kg) today and has not lost weight since her last visit. She has lost 5 lbs since starting treatment with [place].  Insulin Resistance [name] has a diagnosis of insulin resistance based on her elevated fasting insulin level >5. Previously pre-diabetic and she notes carbohydrate cravings. Although [name]'s blood glucose readings are still under good control, insulin resistance puts her at greater risk of metabolic syndrome and diabetes. She is not taking metformin currently and continues to work on diet and exercise to decrease risk of diabetes.  Hypothyroidism [name] has a diagnosis of hypothyroidism. Last T3 was elevated. She is on levothyroxine. She denies hot or cold intolerance or palpitations.  Depression with emotional eating behaviors [name] is on bupropion with some improvement in cravings, she decreased Prozac on her own. [name] struggles with emotional eating and using food for comfort to the extent that it is negatively impacting her health. She often snacks when she is not hungry. [name] sometimes feels she is out of control and then feels guilty that she made poor food choices. She has been working on behavior modification techniques to help reduce her emotional eating and has been somewhat successful. She shows no sign of suicidal or homicidal ideations.  Depression screen [facility] 2/9 01/21/2018 04/19/2016 01/24/2016 11/20/2015 10/03/2015  Decreased Interest 3 0 0 0 0  Down, Depressed, Hopeless 3 0 0 0 0  PHQ - 2 Score 6 0 0 0 0  Altered sleeping 2 - - - -  Tired,  decreased energy 3 - - - -  Change in appetite 3 - - - -  Feeling bad or failure about yourself  3 - - - -  Trouble concentrating 1 - - - -  Moving slowly or fidgety/restless 1 - - - -  Suicidal thoughts 0 - - - -  PHQ-9 Score 19 - - - -  Difficult doing work/chores Very difficult - - - -    ALLERGIES: Allergies  Allergen Reactions  . Ampicillin Anaphylaxis    Has patient had a PCN reaction causing immediate rash, facial/tongue/throat swelling, SOB or lightheadedness with hypotension: Yes Has patient had a PCN reaction causing severe rash involving mucus membranes or skin necrosis: Yes Has patient had a PCN reaction that required hospitalization Yes Has patient had a PCN reaction occurring within the last 10 years: No If all of the above answers are "NO", then may proceed with Cephalosporin use.   . Codeine Hives and Shortness Of Breath  . Sulfonamide Derivatives Itching and Rash    MEDICATIONS: Current Outpatient Medications on File Prior to Visit  Medication Sig Dispense Refill  . buPROPion (WELLBUTRIN SR) 150 MG 12 hr tablet Take 1 tablet (150 mg total) by mouth daily. 30 tablet 0  . dexlansoprazole (DEXILANT) 60 MG capsule Take 60 mg by mouth daily.    [name] FLUoxetine (PROZAC) 20 MG capsule Take 40 mg by mouth at bedtime.     [name] levothyroxine (SYNTHROID, LEVOTHROID) 150 MCG tablet TAKE 1 TABLET BY MOUTH ONCE DAILY 30  tablet 0  . nystatin-triamcinolone ointment (MYCOLOG) Apply 1 application topically 2 (two) times daily. 30 g 1  . thyroid (ARMOUR) 90 MG tablet Take 120 mg by mouth daily.      No current facility-administered medications on file prior to visit.     PAST MEDICAL HISTORY: Past Medical History:  Diagnosis Date  . Allergic rhinitis   . Anxiety   . Asthma   . Bulging disc   . Cancer (HCC)    cervical cancer   . Chronic back pain   . Chronic cough   . Depression   . Diabetes mellitus    diet control only  . Diverticulosis   . Dyspnea    chronic  . Family  history of adverse reaction to anesthesia    pts daughter had reaction to profolol - difficulty with breathing   . GERD (gastroesophageal reflux disease)   . High cholesterol   . History of bronchitis   . Hx of adenomatous and sessile serrated colonic polyps   . Hypertension   . Hypothyroidism   . Lumbosacral spondylosis without myelopathy    history bulging disc-chronic pain(level 3 to 8).  . Normal cardiac stress test 02/2015   with normal EF  . Numbness    feet bilat comes and goes   . Obesity   . Obstructive sleep apnea    uses c-pap at night  . Palpitations   . Pneumonia    last episode 2012  . PONV (postoperative nausea and vomiting)   . Prediabetes   . Restless leg   . Vitamin D deficiency   . Vocal cord dysfunction     PAST SURGICAL HISTORY: Past Surgical History:  Procedure Laterality Date  . ABDOMINAL HYSTERECTOMY  1996  . CHOLECYSTECTOMY  1994  . COLONOSCOPY WITH PROPOFOL N/A 03/10/2015   Procedure: COLONOSCOPY WITH PROPOFOL;  Surgeon: [name], MD;  Location: [facility];  Service: Endoscopy;  Laterality: N/A;  . ESOPHAGOGASTRODUODENOSCOPY N/A 10/27/2013   Procedure: ESOPHAGOGASTRODUODENOSCOPY (EGD);  Surgeon: [name], MD;  Location: [facility];  Service: Endoscopy;  Laterality: N/A;  . KNEE ARTHROSCOPY Right   . LAPAROSCOPIC GASTRIC SLEEVE RESECTION WITH HIATAL HERNIA REPAIR N/A 09/19/2015   Procedure: LAPAROSCOPIC GASTRIC SLEEVE RESECTION WITH HIATAL HERNIA REPAIR;  Surgeon: [name], MD;  Location: [facility];  Service: General;  Laterality: N/A;  . NOSE SURGERY  2004  . ovaries removed    . TUBAL LIGATION    . UPPER GI ENDOSCOPY  09/19/2015   Procedure: UPPER GI ENDOSCOPY;  Surgeon: [name], MD;  Location: [facility];  Service: General;;    SOCIAL HISTORY: Social History   Tobacco Use  . Smoking status: Never Smoker  . Smokeless tobacco: Never Used  Substance Use Topics  . Alcohol use: No  . Drug use: No    FAMILY HISTORY: Family  History  Problem Relation Age of Onset  . Emphysema Mother   . Allergies Mother   . Heart disease Mother   . Asthma Mother   . Ovarian cancer Mother   . Hypertension Mother   . Diabetes Mother   . High Cholesterol Mother   . Stroke Mother   . Thyroid disease Mother   . Depression Mother   . Anxiety disorder Mother   . Heart disease Father        deceased at age 75 from heart attack  . Sudden death Father   . High Cholesterol Father   . High blood pressure Father   .  Stroke Maternal Grandmother   . Heart disease Maternal Grandmother   . Cancer Maternal Grandfather   . Heart disease Paternal Grandmother   . Multiple sclerosis Paternal Grandmother   . Heart attack Paternal Grandfather   . Thyroid disease Brother        died at 29  . Heart attack Brother        died at 60  . COPD Brother   . Hyperthyroidism Sister   . Hypertension Brother   . Hypertension Daughter   . Allergies Child   . Asthma Son   . Asthma Brother   . Asthma Brother   . Lung cancer Maternal Aunt   . COPD Sister        died at 32  . Sudden death Sister   . Asthma Son     ROS: Review of Systems  Constitutional: Negative for weight loss.  Cardiovascular: Negative for palpitations.  Endo/Heme/Allergies:       Negative hot/cold intolerance  Psychiatric/Behavioral: Positive for depression. Negative for suicidal ideas.    PHYSICAL EXAM: Blood pressure 103/67, pulse 74, temperature 97.8 F (36.6 C), temperature source Oral, height 5\' 3"  (1.6 m), weight 243 lb (110.2 kg), SpO2 97 %. Body mass index is 43.05 kg/m. Physical Exam  Constitutional: She is oriented to person, place, and time. She appears well-developed and well-nourished.  Cardiovascular: Normal rate.  Pulmonary/Chest: Effort normal.  Musculoskeletal: Normal range of motion.  Neurological: She is oriented to person, place, and time.  Skin: Skin is warm and dry.  Psychiatric: She has a normal mood and affect. Her behavior is normal.    Vitals reviewed.   RECENT LABS AND TESTS: BMET    Component Value Date/Time   NA 140 02/05/2018 1055   K 4.5 02/05/2018 1055   CL 105 02/05/2018 1055   CO2 22 02/05/2018 1055   GLUCOSE 91 02/05/2018 1055   GLUCOSE 84 11/10/2015 0527   BUN 10 02/05/2018 1055   CREATININE 0.62 02/05/2018 1055   CREATININE 0.88 02/02/2015 1458   CREATININE 0.88 02/02/2015 1458   CALCIUM 9.3 02/05/2018 1055   GFRNONAA 108 02/05/2018 1055   GFRAA 125 02/05/2018 1055   Lab Results  Component Value Date   HGBA1C 5.4 02/05/2018   HGBA1C 6.3 (H) 09/14/2015   HGBA1C (H) 02/12/2011    6.2 (NOTE)                                                                       According to the ADA Clinical Practice Recommendations for 2011, when HbA1c is used as a screening test:   >=6.5%   Diagnostic of Diabetes Mellitus           (if abnormal result  is confirmed)  5.7-6.4%   Increased risk of developing Diabetes Mellitus  References:Diagnosis and Classification of Diabetes Mellitus,Diabetes XAJO,8786,76(HMCNO 1):S62-S69 and Standards of Medical Care in         Diabetes - 2011,Diabetes Care,2011,34  (Suppl 1):S11-S61.   HGBA1C 6.2 (H) 02/02/2008   Lab Results  Component Value Date   INSULIN 11.0 01/21/2018   CBC    Component Value Date/Time   WBC 5.8 02/05/2018 1055   WBC 3.8 (L) 11/10/2015 0527   RBC 4.43 02/05/2018 1055  RBC 4.46 11/10/2015 0527   HGB 12.4 02/05/2018 1055   HCT 37.3 02/05/2018 1055   PLT 121 (L) 11/10/2015 0527   MCV 84 02/05/2018 1055   MCH 28.0 02/05/2018 1055   MCH 27.4 11/10/2015 0527   MCHC 33.2 02/05/2018 1055   MCHC 31.3 11/10/2015 0527   RDW 13.9 02/05/2018 1055   LYMPHSABS 2.6 02/05/2018 1055   MONOABS 0.8 11/09/2015 0515   EOSABS 0.1 02/05/2018 1055   BASOSABS 0.0 02/05/2018 1055   Iron/TIBC/Ferritin/ %Sat No results found for: IRON, TIBC, FERRITIN, IRONPCTSAT Lipid Panel     Component Value Date/Time   CHOL 233 (H) 02/05/2018 1055   TRIG 157 (H) 02/05/2018  1055   HDL 51 02/05/2018 1055   CHOLHDL 4.5 02/12/2008 0540   VLDL 25 02/12/2008 0540   LDLCALC 151 (H) 02/05/2018 1055   Hepatic Function Panel     Component Value Date/Time   PROT 6.8 02/05/2018 1055   ALBUMIN 4.2 02/05/2018 1055   AST 16 02/05/2018 1055   ALT 11 02/05/2018 1055   ALKPHOS 110 02/05/2018 1055   BILITOT 0.3 02/05/2018 1055      Component Value Date/Time   TSH 0.329 (L) 03/16/2018 1618   TSH 1.180 01/21/2018 1056    ASSESSMENT AND PLAN: Insulin resistance - Plan: metFORMIN (GLUCOPHAGE) 500 MG tablet  Other depression - with emotional eating  Other specified hypothyroidism - Plan: T3, T4, free, TSH  Class 3 severe obesity with serious comorbidity and body mass index (BMI) of 40.0 to 44.9 in adult, unspecified obesity type (HCC)  Vitamin D deficiency - Plan: Vitamin D, Ergocalciferol, (DRISDOL) 50000 units CAPS capsule  PLAN:  Insulin Resistance Emmelia will continue to work on weight loss, exercise, and decreasing simple carbohydrates in her diet to help decrease the risk of diabetes. We dicussed metformin including benefits and risks. She was informed that eating too many simple carbohydrates or too many calories at one sitting increases the likelihood of GI side effects. Zaniyah agrees to start metformin 500 mg PO q AM #30 with no refills. Justis agrees to follow up with our clinic in 2 weeks as directed to monitor her progress.  Hypothyroidism Josseline was informed of the importance of good thyroid control to help with weight loss efforts. She was also informed that supertheraputic thyroid levels are dangerous and will not improve weight loss results. We will check labs today and Sofiah agrees to follow up with our clinic in 2 weeks.  Depression with Emotional Eating Behaviors We discussed behavior modification techniques today to help Mikhayla deal with her emotional eating and depression. Francia agrees to continue taking Wellbutrin, she agrees to decrease Fluoxetine  to 40 mg PO q 3 days for 1 week and then decrease Fluoxetine to 20 mg PO q 3 days for 1 week, Start Fluoxetine 20 mg tablet PO q 3 days #30 with no refills. Joel agrees to follow up with our clinic in 2 weeks.  Obesity Britanie is currently in the action stage of change. As such, her goal is to continue with weight loss efforts She has agreed to follow the Category 2 plan Yaniris has been instructed to work up to a goal of 150 minutes of combined cardio and strengthening exercise per week for weight loss and overall health benefits. We discussed the following Behavioral Modification Strategies today: increasing lean protein intake, increasing vegetables, work on meal planning and easy cooking plans, avoiding temptations, and planning for success   Kayleah has agreed to follow up  with our clinic in 2 weeks. She was informed of the importance of frequent follow up visits to maximize her success with intensive lifestyle modifications for her multiple health conditions.   OBESITY BEHAVIORAL INTERVENTION VISIT  Today's visit was # 4 out of 22.  Starting weight: 248 lbs Starting date: 01/21/18 Today's weight : 243 lbs  Today's date: 03/16/2018 Total lbs lost to date: 5 (Patients must lose 7 lbs in the first 6 months to continue with counseling)   ASK: We discussed the diagnosis of obesity with Princess Perna today and Asmara agreed to give [place] permission to discuss obesity behavioral modification therapy today.  ASSESS: Zanaya has the diagnosis of obesity and her BMI today is 49.06 Neveah is in the action stage of change   ADVISE: Melis was educated on the multiple health risks of obesity as well as the benefit of weight loss to improve her health. She was advised of the need for long term treatment and the importance of lifestyle modifications.  AGREE: Multiple dietary modification options and treatment options were discussed and  Windi agreed to the above obesity treatment plan.  I, Trixie Dredge, am acting as transcriptionist for Ilene Qua, MD  I have reviewed the above documentation for accuracy and completeness, and I agree with the above. - Ilene Qua, MD

## 2018-03-23 DIAGNOSIS — I10 Essential (primary) hypertension: Secondary | ICD-10-CM | POA: Diagnosis not present

## 2018-03-23 DIAGNOSIS — K219 Gastro-esophageal reflux disease without esophagitis: Secondary | ICD-10-CM | POA: Diagnosis not present

## 2018-03-23 DIAGNOSIS — E039 Hypothyroidism, unspecified: Secondary | ICD-10-CM | POA: Diagnosis not present

## 2018-03-23 DIAGNOSIS — R7301 Impaired fasting glucose: Secondary | ICD-10-CM | POA: Diagnosis not present

## 2018-03-31 ENCOUNTER — Ambulatory Visit (AMBULATORY_SURGERY_CENTER): Payer: Medicare Other | Admitting: Internal Medicine

## 2018-03-31 ENCOUNTER — Encounter: Payer: Self-pay | Admitting: Internal Medicine

## 2018-03-31 VITALS — BP 116/72 | HR 69 | Temp 98.9°F | Resp 14 | Ht 63.0 in | Wt 243.0 lb

## 2018-03-31 DIAGNOSIS — R197 Diarrhea, unspecified: Secondary | ICD-10-CM

## 2018-03-31 DIAGNOSIS — K580 Irritable bowel syndrome with diarrhea: Secondary | ICD-10-CM

## 2018-03-31 DIAGNOSIS — D124 Benign neoplasm of descending colon: Secondary | ICD-10-CM

## 2018-03-31 DIAGNOSIS — K219 Gastro-esophageal reflux disease without esophagitis: Secondary | ICD-10-CM

## 2018-03-31 DIAGNOSIS — D123 Benign neoplasm of transverse colon: Secondary | ICD-10-CM | POA: Diagnosis not present

## 2018-03-31 DIAGNOSIS — Z86010 Personal history of colonic polyps: Secondary | ICD-10-CM | POA: Diagnosis present

## 2018-03-31 DIAGNOSIS — K317 Polyp of stomach and duodenum: Secondary | ICD-10-CM | POA: Diagnosis not present

## 2018-03-31 DIAGNOSIS — Z800 Family history of malignant neoplasm of digestive organs: Secondary | ICD-10-CM | POA: Insufficient documentation

## 2018-03-31 DIAGNOSIS — K3189 Other diseases of stomach and duodenum: Secondary | ICD-10-CM | POA: Diagnosis not present

## 2018-03-31 MED ORDER — SODIUM CHLORIDE 0.9 % IV SOLN: 500.0000 mL | Freq: Once | INTRAVENOUS | Status: DC

## 2018-03-31 NOTE — Progress Notes (Signed)
Spontaneous respirations throughout. VSS. Resting comfortably. To PACU on room air. Report to  RN. 

## 2018-03-31 NOTE — Op Note (Signed)
[facility] Patient Name: Kristina [name] Procedure Date: 03/31/2018 3:13 PM MRN: [id number] Endoscopist: [name] , MD Age: 47 Referring MD:  Date of Birth: May 09, 1971 Gender: Female Account #: [id number] Procedure:                Upper GI endoscopy Indications:              Heartburn, Suspected esophageal reflux, Diarrhea Medicines:                Propofol per Anesthesia, Monitored Anesthesia Care Procedure:                Pre-Anesthesia Assessment:                           - Prior to the procedure, a History and Physical                            was performed, and patient medications and                            allergies were reviewed. The patient's tolerance of                            previous anesthesia was also reviewed. The risks                            and benefits of the procedure and the sedation                            options and risks were discussed with the patient.                            All questions were answered, and informed consent                            was obtained. Prior Anticoagulants: The patient has                            taken no previous anticoagulant or antiplatelet                            agents. ASA Grade Assessment: II - A patient with                            mild systemic disease. After reviewing the risks                            and benefits, the patient was deemed in                            satisfactory condition to undergo the procedure.                           After obtaining informed consent, the endoscope was  passed under direct vision. Throughout the                            procedure, the patient's blood pressure, pulse, and                            oxygen saturations were monitored continuously. The                            Endoscope was introduced through the mouth, and                            advanced to the second part of duodenum. The upper                      GI endoscopy was accomplished without difficulty.                            The patient tolerated the procedure well. Scope In: Scope Out: Findings:                 Evidence of a sleeve gastrectomy was found in the                            stomach.                           A single 6 mm sessile polyp with no stigmata of                            recent bleeding was found in the gastric antrum.                            The polyp was removed with a cold biopsy forceps.                            Resection and retrieval were complete. Verification                            of patient identification for the specimen was                            done. Estimated blood loss was minimal.                           Diffuse mild mucosal changes characterized by                            discoloration and a decreased vascular pattern were                            found in the gastric antrum. Biopsies were taken                            with a cold forceps  for histology. Verification of                            patient identification for the specimen was done.                            Estimated blood loss was minimal.                           The examined duodenum was normal. Biopsies for                            histology were taken with a cold forceps for                            evaluation of celiac disease. Verification of                            patient identification for the specimen was done.                            Estimated blood loss was minimal.                           The Z-line was irregular and was found 35 cm from                            the incisors.                           The exam was otherwise without abnormality.                           The cardia and gastric fundus were otherwise normal                            s/p sleeve on retroflexion. Complications:            No immediate complications. Estimated Blood Loss:      Estimated blood loss was minimal. Impression:               - A sleeve gastrectomy was found.                           - A single gastric polyp. Resected and retrieved.                           - Discolored and decreased vascular pattern mucosa                            in the antrum. Biopsied.                           - Normal examined duodenum. Biopsied.                           -  Z-line irregular, 35 cm from the incisors.                           - The examination was otherwise normal. Recommendation:           - Patient has a contact number available for                            emergencies. The signs and symptoms of potential                            delayed complications were discussed with the                            patient. Return to normal activities tomorrow.                            Written discharge instructions were provided to the                            patient.                           - Resume previous diet.                           - Continue present medications.                           - Await pathology results.                           - See the other procedure note for documentation of                            additional recommendations. [name], MD 03/31/2018 4:02:20 PM This report has been signed electronically.

## 2018-03-31 NOTE — Op Note (Addendum)
[place] Patient Name: Kristina [name] Procedure Date: 03/31/2018 3:13 PM MRN: [id number] Endoscopist: [name] , MD Age: 47 Referring MD:  Date of Birth: 26-Aug-1971 Gender: Female Account #: [id number] Procedure:                Colonoscopy Indications:              Last colonoscopy: 2016, Personal history of colonic                            polyps, Clinically significant diarrhea of                            unexplained origin Medicines:                Propofol per Anesthesia, Monitored Anesthesia Care Procedure:                Pre-Anesthesia Assessment:                           - Prior to the procedure, a History and Physical                            was performed, and patient medications and                            allergies were reviewed. The patient's tolerance of                            previous anesthesia was also reviewed. The risks                            and benefits of the procedure and the sedation                            options and risks were discussed with the patient.                            All questions were answered, and informed consent                            was obtained. Prior Anticoagulants: The patient has                            taken no previous anticoagulant or antiplatelet                            agents. ASA Grade Assessment: II - A patient with                            mild systemic disease. After reviewing the risks                            and benefits, the patient was deemed in  satisfactory condition to undergo the procedure.                           After obtaining informed consent, the colonoscope                            was passed under direct vision. Throughout the                            procedure, the patient's blood pressure, pulse, and                            oxygen saturations were monitored continuously. The                            Colonoscope was  introduced through the anus and                            advanced to the the terminal ileum, with                            identification of the appendiceal orifice and IC                            valve. The colonoscopy was somewhat difficult due                            to significant looping. Successful completion of                            the procedure was aided by applying abdominal                            pressure. The patient tolerated the procedure well.                            The quality of the bowel preparation was adequate.                            The terminal ileum, ileocecal valve, appendiceal                            orifice, and rectum were photographed. Scope In: 3:30:44 PM Scope Out: 3:51:33 PM Scope Withdrawal Time: 0 hours 16 minutes 27 seconds  Total Procedure Duration: 0 hours 20 minutes 49 seconds  Findings:                 The perianal and digital rectal examinations were                            normal.                           Two sessile polyps were found in the descending  colon and transverse colon. The polyps were                            diminutive in size. These polyps were removed with                            a cold snare. Resection and retrieval were                            complete. Verification of patient identification                            for the specimen was done. Estimated blood loss was                            minimal.                           A 1 to 2 mm polyp was found in the transverse                            colon. The polyp was sessile. The polyp was removed                            with a cold biopsy forceps. Resection and retrieval                            were complete. Verification of patient                            identification for the specimen was done. Estimated                            blood loss was minimal.                           The terminal ileum  appeared normal.                           Scattered diverticula were found in the sigmoid                            colon.                           The exam was otherwise without abnormality.                           Biopsies for histology were taken with a cold                            forceps from the right colon and left colon for                            evaluation  of microscopic colitis. Complications:            No immediate complications. Estimated Blood Loss:     Estimated blood loss was minimal. Impression:               - Two diminutive polyps in the descending colon and                            in the transverse colon, removed with a cold snare.                            Resected and retrieved.                           - One 1 to 2 mm polyp in the transverse colon,                            removed with a cold biopsy forceps. Resected and                            retrieved.                           - The examined portion of the ileum was normal.                           - Diverticulosis in the sigmoid colon.                           - The examination was otherwise normal.                           - Biopsies were taken with a cold forceps from the                            right colon and left colon for evaluation of                            microscopic colitis.                           - Personal history of colonic polyps. Recommendation:           - Patient has a contact number available for                            emergencies. The signs and symptoms of potential                            delayed complications were discussed with the                            patient. Return to normal activities tomorrow.                            Written discharge instructions  were provided to the                            patient.                           - Resume previous diet.                           - Continue present medications.                            - Await pathology results.                           - Repeat colonoscopy is recommended for                            surveillance. The colonoscopy date will be                            determined after pathology results from today's                            exam become available for review.                           NOTE - told me in recovery 44 yo brother recently                            dxed colon cancer [name], MD 03/31/2018 4:07:05 PM This report has been signed electronically.

## 2018-03-31 NOTE — Patient Instructions (Addendum)
   There was one stomach polyp that I removed. I took biopsies of the stomach, small intestine and colon to try to get clues to why you have diarrhea.  I found and removed 3 small polyps that were not causing problems but we removed them.  I will contact you with results and plans.  I appreciate the opportunity to care for you. [name], MD, [facility]   **Handout given on Gastritis**  YOU HAD AN ENDOSCOPIC PROCEDURE TODAY: Refer to the procedure report and other information in the discharge instructions given to you for any specific questions about what was found during the examination. If this information does not answer your questions, please call [place] office at [phone] to clarify.   YOU SHOULD EXPECT: Some feelings of bloating in the abdomen. Passage of more gas than usual. Walking can help get rid of the air that was put into your GI tract during the procedure and reduce the bloating. If you had a lower endoscopy (such as a colonoscopy or flexible sigmoidoscopy) you may notice spotting of blood in your stool or on the toilet paper. Some abdominal soreness may be present for a day or two, also.  DIET: Your first meal following the procedure should be a light meal and then it is ok to progress to your normal diet. A half-sandwich or bowl of soup is an example of a good first meal. Heavy or fried foods are harder to digest and may make you feel nauseous or bloated. Drink plenty of fluids but you should avoid alcoholic beverages for 24 hours. If you had a esophageal dilation, please see attached instructions for diet.    ACTIVITY: Your care partner should take you home directly after the procedure. You should plan to take it easy, moving slowly for the rest of the day. You can resume normal activity the day after the procedure however YOU SHOULD NOT DRIVE, use power tools, machinery or perform tasks that involve climbing or major physical exertion for 24 hours (because of the  sedation medicines used during the test).   SYMPTOMS TO REPORT IMMEDIATELY: A gastroenterologist can be reached at any hour. Please call [phone]  for any of the following symptoms:  Following lower endoscopy (colonoscopy, flexible sigmoidoscopy) Excessive amounts of blood in the stool  Significant tenderness, worsening of abdominal pains  Swelling of the abdomen that is new, acute  Fever of 100 or higher  Following upper endoscopy (EGD, EUS, ERCP, esophageal dilation) Vomiting of blood or coffee ground material  New, significant abdominal pain  New, significant chest pain or pain under the shoulder blades  Painful or persistently difficult swallowing  New shortness of breath  Black, tarry-looking or red, bloody stools  FOLLOW UP:  If any biopsies were taken you will be contacted by phone or by letter within the next 1-3 weeks. Call [phone]  if you have not heard about the biopsies in 3 weeks.  Please also call with any specific questions about appointments or follow up tests.

## 2018-03-31 NOTE — Progress Notes (Signed)
I have reviewed the patient's medical history in detail and updated the computerized patient record.

## 2018-03-31 NOTE — Progress Notes (Signed)
Called to room to assist during endoscopic procedure.  Patient ID and intended procedure confirmed with present staff. Received instructions for my participation in the procedure from the performing physician.  

## 2018-04-01 ENCOUNTER — Telehealth: Payer: Self-pay | Admitting: *Deleted

## 2018-04-01 NOTE — Telephone Encounter (Signed)
  Follow up Call-  Call back number 03/31/2018  Post procedure Call Back phone  # [phone]  Permission to leave phone message Yes  Some recent data might be hidden     Patient questions:  Do you have a fever, pain , or abdominal swelling? No. Pain Score  0 *  Have you tolerated food without any problems? Yes.    Have you been able to return to your normal activities? Yes.    Do you have any questions about your discharge instructions: Diet   No. Medications  No. Follow up visit  No.  Do you have questions or concerns about your Care? No.  Actions: * If pain score is 4 or above: No action needed, pain <4.

## 2018-04-01 NOTE — Telephone Encounter (Signed)
No answer, left message to call if questions or concerns. 

## 2018-04-03 ENCOUNTER — Other Ambulatory Visit (INDEPENDENT_AMBULATORY_CARE_PROVIDER_SITE_OTHER): Payer: Self-pay | Admitting: Family Medicine

## 2018-04-03 DIAGNOSIS — F3289 Other specified depressive episodes: Secondary | ICD-10-CM

## 2018-04-06 ENCOUNTER — Other Ambulatory Visit (INDEPENDENT_AMBULATORY_CARE_PROVIDER_SITE_OTHER): Payer: Self-pay

## 2018-04-06 ENCOUNTER — Telehealth: Payer: Self-pay | Admitting: Internal Medicine

## 2018-04-06 ENCOUNTER — Ambulatory Visit (INDEPENDENT_AMBULATORY_CARE_PROVIDER_SITE_OTHER): Payer: Medicare Other | Admitting: Family Medicine

## 2018-04-06 VITALS — BP 138/85 | HR 95 | Temp 98.1°F | Ht 63.0 in | Wt 238.0 lb

## 2018-04-06 DIAGNOSIS — F3289 Other specified depressive episodes: Secondary | ICD-10-CM

## 2018-04-06 DIAGNOSIS — E038 Other specified hypothyroidism: Secondary | ICD-10-CM | POA: Diagnosis not present

## 2018-04-06 DIAGNOSIS — Z9189 Other specified personal risk factors, not elsewhere classified: Secondary | ICD-10-CM

## 2018-04-06 DIAGNOSIS — R7303 Prediabetes: Secondary | ICD-10-CM

## 2018-04-06 DIAGNOSIS — Z6841 Body Mass Index (BMI) 40.0 and over, adult: Secondary | ICD-10-CM

## 2018-04-06 MED ORDER — LEVOTHYROXINE SODIUM 137 MCG PO TABS: 150.0000 ug | ORAL_TABLET | Freq: Every day | ORAL | 0 refills | Status: DC

## 2018-04-06 MED ORDER — BUPROPION HCL ER (SR) 200 MG PO TB12: 150.0000 mg | ORAL_TABLET | Freq: Every day | ORAL | 0 refills | Status: DC

## 2018-04-06 MED ORDER — BUPROPION HCL ER (SR) 200 MG PO TB12: 200.0000 mg | ORAL_TABLET | Freq: Every day | ORAL | 0 refills | Status: DC

## 2018-04-06 MED ORDER — LEVOTHYROXINE SODIUM 137 MCG PO TABS: 137.0000 ug | ORAL_TABLET | Freq: Every day | ORAL | 0 refills | Status: DC

## 2018-04-06 MED ORDER — DEXLANSOPRAZOLE 60 MG PO CPDR: 60.0000 mg | DELAYED_RELEASE_CAPSULE | Freq: Every day | ORAL | 3 refills | Status: DC

## 2018-04-06 NOTE — Telephone Encounter (Signed)
Dexilant refilled as requested, patient had EGD last week and report said continue current medicines.

## 2018-04-07 NOTE — Progress Notes (Signed)
Office: [phone]  /  Fax: [phone]   HPI:   Chief Complaint: OBESITY Kristina [name] is here to discuss her progress with her obesity treatment plan. She is on the Category 2 plan and is following her eating plan approximately 80 % of the time. She states she is exercising 0 minutes 0 times per week. Kristina [name] is doing well on her meal plan, but she is looking for alternatives to breakfast. Her weight is 238 lb (108 kg) today and has had a weight loss of 5 pounds over a period of 3 weeks since her last visit. She has lost 10 lbs since starting treatment with [place].  Hypothyroidism Kristina [name] has a diagnosis of hypothyroidism. She is on levothyroxine. She denies hot or cold intolerance or palpitations, but does admit to ongoing fatigue.  Pre-Diabetes Kristina [name] has a diagnosis of prediabetes based on her elevated Hgb A1c and was informed this puts her at greater risk of developing diabetes. She is taking metformin currently and continues to work on diet and exercise to decrease risk of diabetes. She denies any GI side effects or hypoglycemia.  At risk for diabetes Lenny is at higher than average risk for developing diabetes due to her obesity. She currently denies polyuria or polydipsia.  Depression with emotional eating behaviors Kristina [name] is feeling well on Wellbutrin. She has no increase in blood pressure and she denies dry mouth. Kristina [name] struggles with emotional eating and using food for comfort to the extent that it is negatively impacting her health. She often snacks when she is not hungry. Kristina [name] sometimes feels she is out of control and then feels guilty that she made poor food choices. She has been working on behavior modification techniques to help reduce her emotional eating and has been somewhat successful. She shows no sign of suicidal or homicidal ideations.  Depression screen Kristina [name] 2/9 01/21/2018 04/19/2016 01/24/2016 11/20/2015 10/03/2015  Decreased Interest 3 0 0 0 0  Down, Depressed, Hopeless 3 0 0 0 0    PHQ - 2 Score 6 0 0 0 0  Altered sleeping 2 - - - -  Tired, decreased energy 3 - - - -  Change in appetite 3 - - - -  Feeling bad or failure about yourself  3 - - - -  Trouble concentrating 1 - - - -  Moving slowly or fidgety/restless 1 - - - -  Suicidal thoughts 0 - - - -  PHQ-9 Score 19 - - - -  Difficult doing work/chores Very difficult - - - -    ALLERGIES: Allergies  Allergen Reactions  . Ampicillin Anaphylaxis    Has patient had a PCN reaction causing immediate rash, facial/tongue/throat swelling, SOB or lightheadedness with hypotension: Yes Has patient had a PCN reaction causing severe rash involving mucus membranes or skin necrosis: Yes Has patient had a PCN reaction that required hospitalization Yes Has patient had a PCN reaction occurring within the last 10 years: No If all of the above answers are "NO", then may proceed with Cephalosporin use.   . Codeine Hives and Shortness Of Breath  . Sulfonamide Derivatives Itching and Rash    MEDICATIONS: Current Outpatient Medications on File Prior to Visit  Medication Sig Dispense Refill  . metFORMIN (GLUCOPHAGE) 500 MG tablet Take 1 tablet (500 mg total) by mouth daily with breakfast. 30 tablet 0  . nystatin-triamcinolone ointment (MYCOLOG) Apply 1 application topically 2 (two) times daily. 30 g 1  . Vitamin D, Ergocalciferol, (DRISDOL) 50000 units CAPS capsule Take  1 capsule (50,000 Units total) by mouth every 7 (seven) days. 4 capsule 0   Current Facility-Administered Medications on File Prior to Visit  Medication Dose Route Frequency Provider Last Rate Last Dose  . 0.9 %  sodium chloride infusion  500 mL Intravenous Once [name], MD        PAST MEDICAL HISTORY: Past Medical History:  Diagnosis Date  . Allergic rhinitis   . Anxiety   . Asthma   . Bulging disc   . Cancer (HCC)    cervical cancer   . Chronic back pain   . Chronic cough   . Depression   . Diabetes mellitus    diet control only  .  Diverticulosis   . Dyspnea    chronic  . Family history of adverse reaction to anesthesia    pts daughter had reaction to profolol - difficulty with breathing   . GERD (gastroesophageal reflux disease)   . High cholesterol   . History of bronchitis   . Hx of adenomatous and sessile serrated colonic polyps   . Hypertension   . Hypothyroidism   . Lumbosacral spondylosis without myelopathy    history bulging disc-chronic pain(level 3 to 8).  . Normal cardiac stress test 02/2015   with normal EF  . Numbness    feet bilat comes and goes   . Obesity   . Obstructive sleep apnea    uses c-pap at night  . Palpitations   . Pneumonia    last episode 2012  . PONV (postoperative nausea and vomiting)   . Prediabetes   . Restless leg   . Vitamin D deficiency   . Vocal cord dysfunction     PAST SURGICAL HISTORY: Past Surgical History:  Procedure Laterality Date  . ABDOMINAL HYSTERECTOMY  1996  . CHOLECYSTECTOMY  1994  . COLONOSCOPY WITH PROPOFOL N/A 03/10/2015   Procedure: COLONOSCOPY WITH PROPOFOL;  Surgeon: [name], MD;  Location: [facility];  Service: Endoscopy;  Laterality: N/A;  . ESOPHAGOGASTRODUODENOSCOPY N/A 10/27/2013   Procedure: ESOPHAGOGASTRODUODENOSCOPY (EGD);  Surgeon: [name], MD;  Location: [facility];  Service: Endoscopy;  Laterality: N/A;  . KNEE ARTHROSCOPY Right   . LAPAROSCOPIC GASTRIC SLEEVE RESECTION WITH HIATAL HERNIA REPAIR N/A 09/19/2015   Procedure: LAPAROSCOPIC GASTRIC SLEEVE RESECTION WITH HIATAL HERNIA REPAIR;  Surgeon: [name], MD;  Location: [facility];  Service: General;  Laterality: N/A;  . NOSE SURGERY  2004  . ovaries removed    . TUBAL LIGATION    . UPPER GI ENDOSCOPY  09/19/2015   Procedure: UPPER GI ENDOSCOPY;  Surgeon: [name], MD;  Location: [facility];  Service: General;;     SOCIAL HISTORY: Social History   Tobacco Use  . Smoking status: Never Smoker  . Smokeless tobacco: Never Used  Substance Use Topics  . Alcohol use: No   . Drug use: No    FAMILY HISTORY: Family History  Problem Relation Age of Onset  . Emphysema Mother   . Allergies Mother   . Heart disease Mother   . Asthma Mother   . Ovarian cancer Mother   . Hypertension Mother   . Diabetes Mother   . High Cholesterol Mother   . Stroke Mother   . Thyroid disease Mother   . Depression Mother   . Anxiety disorder Mother   . Heart disease Father        deceased at age 73 from heart attack  . Sudden death Father   .  High Cholesterol Father   . High blood pressure Father   . Stroke Maternal Grandmother   . Heart disease Maternal Grandmother   . Cancer Maternal Grandfather   . Heart disease Paternal Grandmother   . Multiple sclerosis Paternal Grandmother   . Heart attack Paternal Grandfather   . Thyroid disease Brother        died at 21  . Heart attack Brother        died at 35  . COPD Brother   . Colon cancer Brother 15  . Hyperthyroidism Sister   . Hypertension Brother   . Hypertension Daughter   . Allergies Child   . Asthma Son   . Asthma Brother   . Asthma Brother   . Lung cancer Maternal Aunt   . COPD Sister        died at 50  . Sudden death Sister   . Asthma Son     ROS: Review of Systems  Constitutional: Positive for malaise/fatigue and weight loss.  HENT:       Negative for dry mouth  Cardiovascular: Negative for palpitations.  Gastrointestinal: Negative for diarrhea, nausea and vomiting.  Genitourinary: Negative for frequency.  Endo/Heme/Allergies: Negative for polydipsia.       Negative for hot or cold intolerance Negative for hypoglycemia  Psychiatric/Behavioral: Positive for depression. Negative for suicidal ideas.    PHYSICAL EXAM: Blood pressure 138/85, pulse 95, temperature 98.1 F (36.7 C), temperature source Oral, height 5\' 3"  (1.6 m), weight 238 lb (108 kg), SpO2 95 %. Body mass index is 42.16 kg/m. Physical Exam  Constitutional: She is oriented to person, place, and time. She appears  well-developed and well-nourished.  Cardiovascular: Normal rate.  Pulmonary/Chest: Effort normal.  Musculoskeletal: Normal range of motion.  Neurological: She is oriented to person, place, and time.  Skin: Skin is warm and dry.  Psychiatric: She has a normal mood and affect. Her behavior is normal.  Vitals reviewed.   RECENT LABS AND TESTS: BMET    Component Value Date/Time   NA 140 02/05/2018 1055   K 4.5 02/05/2018 1055   CL 105 02/05/2018 1055   CO2 22 02/05/2018 1055   GLUCOSE 91 02/05/2018 1055   GLUCOSE 84 11/10/2015 0527   BUN 10 02/05/2018 1055   CREATININE 0.62 02/05/2018 1055   CREATININE 0.88 02/02/2015 1458   CREATININE 0.88 02/02/2015 1458   CALCIUM 9.3 02/05/2018 1055   GFRNONAA 108 02/05/2018 1055   GFRAA 125 02/05/2018 1055   Lab Results  Component Value Date   HGBA1C 5.4 02/05/2018   HGBA1C 6.3 (H) 09/14/2015   HGBA1C (H) 02/12/2011    6.2 (NOTE)                                                                       According to the ADA Clinical Practice Recommendations for 2011, when HbA1c is used as a screening test:   >=6.5%   Diagnostic of Diabetes Mellitus           (if abnormal result  is confirmed)  5.7-6.4%   Increased risk of developing Diabetes Mellitus  References:Diagnosis and Classification of Diabetes Mellitus,Diabetes ZHGD,9242,68(TMHDQ 1):S62-S69 and Standards of Medical Care in  Diabetes - 2011,Diabetes Care,2011,34  (Suppl 1):S11-S61.   HGBA1C 6.2 (H) 02/02/2008   Lab Results  Component Value Date   INSULIN 11.0 01/21/2018   CBC    Component Value Date/Time   WBC 5.8 02/05/2018 1055   WBC 3.8 (L) 11/10/2015 0527   RBC 4.43 02/05/2018 1055   RBC 4.46 11/10/2015 0527   HGB 12.4 02/05/2018 1055   HCT 37.3 02/05/2018 1055   PLT 121 (L) 11/10/2015 0527   MCV 84 02/05/2018 1055   MCH 28.0 02/05/2018 1055   MCH 27.4 11/10/2015 0527   MCHC 33.2 02/05/2018 1055   MCHC 31.3 11/10/2015 0527   RDW 13.9 02/05/2018 1055   LYMPHSABS  2.6 02/05/2018 1055   MONOABS 0.8 11/09/2015 0515   EOSABS 0.1 02/05/2018 1055   BASOSABS 0.0 02/05/2018 1055   Iron/TIBC/Ferritin/ %Sat No results found for: IRON, TIBC, FERRITIN, IRONPCTSAT Lipid Panel     Component Value Date/Time   CHOL 233 (H) 02/05/2018 1055   TRIG 157 (H) 02/05/2018 1055   HDL 51 02/05/2018 1055   CHOLHDL 4.5 02/12/2008 0540   VLDL 25 02/12/2008 0540   LDLCALC 151 (H) 02/05/2018 1055   Hepatic Function Panel     Component Value Date/Time   PROT 6.8 02/05/2018 1055   ALBUMIN 4.2 02/05/2018 1055   AST 16 02/05/2018 1055   ALT 11 02/05/2018 1055   ALKPHOS 110 02/05/2018 1055   BILITOT 0.3 02/05/2018 1055      Component Value Date/Time   TSH 0.329 (L) 03/16/2018 1618   TSH 1.180 01/21/2018 1056   Results for MAIA, HANDA (MRN [id number]) as of 04/07/2018 09:15  Ref. Range 01/21/2018 10:56  Vitamin D, 25-Hydroxy Latest Ref Range: 30.0 - 100.0 ng/mL 28.8 (L)   ASSESSMENT AND PLAN: Other specified hypothyroidism - Plan: levothyroxine (SYNTHROID, LEVOTHROID) 137 MCG tablet  Prediabetes  Other depression - with emotional eating - Plan: buPROPion (WELLBUTRIN SR) 200 MG 12 hr tablet  At risk for diabetes mellitus  Class 3 severe obesity with serious comorbidity and body mass index (BMI) of 40.0 to 44.9 in adult, unspecified obesity type ([place])  PLAN:  Hypothyroidism Yardley was informed of the importance of good thyroid control to help with weight loss efforts. She was also informed that supertheraputic thyroid levels are dangerous and will not improve weight loss results. Rebecca agrees to continue levothyroxine 137 mcg qAM #30 with no refills and follow up as directed.  Pre-Diabetes Kristina [name] will continue to work on weight loss, exercise, and decreasing simple carbohydrates in her diet to help decrease the risk of diabetes. We dicussed metformin including benefits and risks. She was informed that eating too many simple carbohydrates or too many calories at  one sitting increases the likelihood of GI side effects. Kristina [name] will continue metformin and follow up with [place] as directed to monitor her progress.  Diabetes risk counseling Kristina [name] was given extended (15 minutes) diabetes prevention counseling today. She is 47 y.o. female and has risk factors for diabetes including obesity and pre-diabetes. We discussed intensive lifestyle modifications today with an emphasis on weight loss as well as increasing exercise and decreasing simple carbohydrates in her diet.  Depression with Emotional Eating Behaviors We discussed behavior modification techniques today to help Kristina [name] deal with her emotional eating and depression. She has agreed to take Wellbutrin SR 200 mg qAM #30 with no refills and follow up as directed.  Obesity Kristina [name] is currently in the action stage of change. As such, her goal is to  continue with weight loss efforts She has agreed to keep a food journal with 200 to 300 calories and 20 grams of protein at breakfast daily and follow the Category 2 plan Kristina [name] has been instructed to work up to a goal of 150 minutes of combined cardio and strengthening exercise per week for weight loss and overall health benefits. We discussed the following Behavioral Modification Strategies today: better snacking choices, planning for success, keep a strict food journal, increasing lean protein intake, increasing vegetables and work on meal planning and easy cooking plans  Kristina [name] has agreed to follow up with our clinic in 2 weeks. She was informed of the importance of frequent follow up visits to maximize her success with intensive lifestyle modifications for her multiple health conditions.   OBESITY BEHAVIORAL INTERVENTION VISIT  Today's visit was # 5 out of 22.  Starting weight: 248 lbs Starting date: 01/21/18 Today's weight : 238 lbs Today's date: 04/06/2018 Total lbs lost to date: 10    ASK: We discussed the diagnosis of obesity with Kristina [name] today and  Kristina [name] agreed to give [place] permission to discuss obesity behavioral modification therapy today.  ASSESS: Kristina [name] has the diagnosis of obesity and her BMI today is 42.17 Kristina [name] is in the action stage of change   ADVISE: Kristina [name] was educated on the multiple health risks of obesity as well as the benefit of weight loss to improve her health. She was advised of the need for long term treatment and the importance of lifestyle modifications.  AGREE: Multiple dietary modification options and treatment options were discussed and  Kristina [name] agreed to the above obesity treatment plan.  I, Doreene Nest, am acting as transcriptionist for Eber Jones, MD  I have reviewed the above documentation for accuracy and completeness, and I agree with the above. - Ilene Qua, MD

## 2018-04-08 NOTE — Progress Notes (Signed)
Fundic glaned polyp, gastropathy and NL duodenal bxs NL random colon  3 adenomas  LEC - recall colon 3 years  Office - call her and explain no cancer and no cause of diarrhea found - polyps in colon benign but need recheck in 3 yrs 2022  Please get me a symptom update GERD sxs Diarrhea sxs

## 2018-04-09 NOTE — Progress Notes (Signed)
Try alosetron 0.5 mg bid # 60 no refill this treats diarrhea predominant IBS If gets constipated or has bad abdominal pain stop Call me back with an update in 2 weeks We nmeed to get her on books for a next available appt

## 2018-04-10 ENCOUNTER — Other Ambulatory Visit: Payer: Self-pay

## 2018-04-10 MED ORDER — ALOSETRON HCL 0.5 MG PO TABS: 0.5000 mg | ORAL_TABLET | Freq: Two times a day (BID) | ORAL | 0 refills | Status: DC

## 2018-04-14 ENCOUNTER — Other Ambulatory Visit (INDEPENDENT_AMBULATORY_CARE_PROVIDER_SITE_OTHER): Payer: Self-pay | Admitting: Family Medicine

## 2018-04-14 DIAGNOSIS — E8881 Metabolic syndrome: Secondary | ICD-10-CM

## 2018-04-23 ENCOUNTER — Ambulatory Visit (INDEPENDENT_AMBULATORY_CARE_PROVIDER_SITE_OTHER): Payer: Medicare Other | Admitting: Family Medicine

## 2018-04-23 VITALS — BP 125/80 | HR 82 | Temp 97.9°F | Ht 63.0 in | Wt 235.0 lb

## 2018-04-23 DIAGNOSIS — F3289 Other specified depressive episodes: Secondary | ICD-10-CM | POA: Diagnosis not present

## 2018-04-23 DIAGNOSIS — E559 Vitamin D deficiency, unspecified: Secondary | ICD-10-CM | POA: Diagnosis not present

## 2018-04-23 DIAGNOSIS — E66813 Obesity, class 3: Secondary | ICD-10-CM

## 2018-04-23 DIAGNOSIS — Z6841 Body Mass Index (BMI) 40.0 and over, adult: Secondary | ICD-10-CM | POA: Diagnosis not present

## 2018-04-23 DIAGNOSIS — Z9189 Other specified personal risk factors, not elsewhere classified: Secondary | ICD-10-CM

## 2018-04-23 MED ORDER — BUPROPION HCL ER (SR) 150 MG PO TB12: 150.0000 mg | ORAL_TABLET | Freq: Two times a day (BID) | ORAL | 0 refills | Status: DC

## 2018-04-24 ENCOUNTER — Other Ambulatory Visit (INDEPENDENT_AMBULATORY_CARE_PROVIDER_SITE_OTHER): Payer: Self-pay | Admitting: Family Medicine

## 2018-04-24 DIAGNOSIS — F3289 Other specified depressive episodes: Secondary | ICD-10-CM

## 2018-04-27 ENCOUNTER — Other Ambulatory Visit (INDEPENDENT_AMBULATORY_CARE_PROVIDER_SITE_OTHER): Payer: Self-pay | Admitting: Family Medicine

## 2018-04-27 DIAGNOSIS — E8881 Metabolic syndrome: Secondary | ICD-10-CM

## 2018-04-27 NOTE — Progress Notes (Signed)
Office: [phone]  /  Fax: [phone]   HPI:   Chief Complaint: OBESITY Kristina [name] is here to discuss her progress with her obesity treatment plan. She is on the  keep a food journal with 200 to 300 calories and 20 grams of protein at breakfast and follow the Category 2 plan and is following her eating plan approximately 65 % of the time. She states she is exercising 0 minutes 0 times per week. Kristina [name] is struggling with emotional eating, but her son was in an ATV accident. Her weight is 235 lb (106.6 kg) today and has had a weight loss of 3 pounds over a period of 2 weeks since her last visit. She has lost 13 lbs since starting treatment with [place].  Vitamin D deficiency Kristina [name] has a diagnosis of vitamin D deficiency. Kristina [name] is currently taking vit D and admits fatigue, but denies nausea, vomiting or muscle weakness.  At risk for osteopenia and osteoporosis Kristina [name] is at higher risk of osteopenia and osteoporosis due to vitamin D deficiency.   Depression with emotional eating behaviors Kristina [name] admits to craving and stress eating with the increase in familial stress. She struggles with emotional eating and using food for comfort to the extent that it is negatively impacting her health. She often snacks when she is not hungry. Kristina [name] sometimes feels she is out of control and then feels guilty that she made poor food choices. She has been working on behavior modification techniques to help reduce her emotional eating and has been somewhat successful. She shows no sign of suicidal or homicidal ideations.  Depression screen Kristina [name] 2/9 01/21/2018 04/19/2016 01/24/2016 11/20/2015 10/03/2015  Decreased Interest 3 0 0 0 0  Down, Depressed, Hopeless 3 0 0 0 0  PHQ - 2 Score 6 0 0 0 0  Altered sleeping 2 - - - -  Tired, decreased energy 3 - - - -  Change in appetite 3 - - - -  Feeling bad or failure about yourself  3 - - - -  Trouble concentrating 1 - - - -  Moving slowly or fidgety/restless 1 - - - -  Suicidal  thoughts 0 - - - -  PHQ-9 Score 19 - - - -  Difficult doing work/chores Very difficult - - - -     ALLERGIES: Allergies  Allergen Reactions  . Ampicillin Anaphylaxis    Has patient had a PCN reaction causing immediate rash, facial/tongue/throat swelling, SOB or lightheadedness with hypotension: Yes Has patient had a PCN reaction causing severe rash involving mucus membranes or skin necrosis: Yes Has patient had a PCN reaction that required hospitalization Yes Has patient had a PCN reaction occurring within the last 10 years: No If all of the above answers are "NO", then may proceed with Cephalosporin use.   . Codeine Hives and Shortness Of Breath  . Sulfonamide Derivatives Itching and Rash    MEDICATIONS: Current Outpatient Medications on File Prior to Visit  Medication Sig Dispense Refill  . dexlansoprazole (DEXILANT) 60 MG capsule Take 1 capsule (60 mg total) by mouth daily before breakfast. 90 capsule 3  . levothyroxine (SYNTHROID, LEVOTHROID) 137 MCG tablet Take 1 tablet (137 mcg total) by mouth daily. 30 tablet 0  . levothyroxine (SYNTHROID, LEVOTHROID) 137 MCG tablet Take 1 tablet (137 mcg total) by mouth daily before breakfast. 30 tablet 0  . metFORMIN (GLUCOPHAGE) 500 MG tablet TAKE 1 TABLET BY MOUTH ONCE DAILY WITH BREAKFAST 30 tablet 0  . nystatin-triamcinolone ointment (MYCOLOG) Apply  1 application topically 2 (two) times daily. 30 g 1  . Vitamin D, Ergocalciferol, (DRISDOL) 50000 units CAPS capsule Take 1 capsule (50,000 Units total) by mouth every 7 (seven) days. 4 capsule 0   Current Facility-Administered Medications on File Prior to Visit  Medication Dose Route Frequency Provider Last Rate Last Dose  . 0.9 %  sodium chloride infusion  500 mL Intravenous Once [name], MD        PAST MEDICAL HISTORY: Past Medical History:  Diagnosis Date  . Allergic rhinitis   . Anxiety   . Asthma   . Bulging disc   . Cancer (HCC)    cervical cancer   . Chronic back  pain   . Chronic cough   . Depression   . Diabetes mellitus    diet control only  . Diverticulosis   . Dyspnea    chronic  . Family history of adverse reaction to anesthesia    pts daughter had reaction to profolol - difficulty with breathing   . GERD (gastroesophageal reflux disease)   . High cholesterol   . History of bronchitis   . Hx of adenomatous and sessile serrated colonic polyps   . Hypertension   . Hypothyroidism   . Lumbosacral spondylosis without myelopathy    history bulging disc-chronic pain(level 3 to 8).  . Normal cardiac stress test 02/2015   with normal EF  . Numbness    feet bilat comes and goes   . Obesity   . Obstructive sleep apnea    uses c-pap at night  . Palpitations   . Pneumonia    last episode 2012  . PONV (postoperative nausea and vomiting)   . Prediabetes   . Restless leg   . Vitamin D deficiency   . Vocal cord dysfunction     PAST SURGICAL HISTORY: Past Surgical History:  Procedure Laterality Date  . ABDOMINAL HYSTERECTOMY  1996  . CHOLECYSTECTOMY  1994  . COLONOSCOPY WITH PROPOFOL N/A 03/10/2015   Procedure: COLONOSCOPY WITH PROPOFOL;  Surgeon: [name], MD;  Location: [facility];  Service: Endoscopy;  Laterality: N/A;  . ESOPHAGOGASTRODUODENOSCOPY N/A 10/27/2013   Procedure: ESOPHAGOGASTRODUODENOSCOPY (EGD);  Surgeon: [name], MD;  Location: [facility];  Service: Endoscopy;  Laterality: N/A;  . KNEE ARTHROSCOPY Right   . LAPAROSCOPIC GASTRIC SLEEVE RESECTION WITH HIATAL HERNIA REPAIR N/A 09/19/2015   Procedure: LAPAROSCOPIC GASTRIC SLEEVE RESECTION WITH HIATAL HERNIA REPAIR;  Surgeon: [name], MD;  Location: [facility];  Service: General;  Laterality: N/A;  . NOSE SURGERY  2004  . ovaries removed    . TUBAL LIGATION    . UPPER GI ENDOSCOPY  09/19/2015   Procedure: UPPER GI ENDOSCOPY;  Surgeon: [name], MD;  Location: [facility];  Service: General;;    SOCIAL HISTORY: Social History   Tobacco Use  . Smoking status:  Never Smoker  . Smokeless tobacco: Never Used  Substance Use Topics  . Alcohol use: No  . Drug use: No    FAMILY HISTORY: Family History  Problem Relation Age of Onset  . Emphysema Mother   . Allergies Mother   . Heart disease Mother   . Asthma Mother   . Ovarian cancer Mother   . Hypertension Mother   . Diabetes Mother   . High Cholesterol Mother   . Stroke Mother   . Thyroid disease Mother   . Depression Mother   . Anxiety disorder Mother   . Heart disease Father  deceased at age 60 from heart attack  . Sudden death Father   . High Cholesterol Father   . High blood pressure Father   . Stroke Maternal Grandmother   . Heart disease Maternal Grandmother   . Cancer Maternal Grandfather   . Heart disease Paternal Grandmother   . Multiple sclerosis Paternal Grandmother   . Heart attack Paternal Grandfather   . Thyroid disease Brother        died at 67  . Heart attack Brother        died at 72  . COPD Brother   . Colon cancer Brother 63  . Hyperthyroidism Sister   . Hypertension Brother   . Hypertension Daughter   . Allergies Child   . Asthma Son   . Asthma Brother   . Asthma Brother   . Lung cancer Maternal Aunt   . COPD Sister        died at 38  . Sudden death Sister   . Asthma Son     ROS: Review of Systems  Constitutional: Positive for malaise/fatigue and weight loss.  Gastrointestinal: Negative for nausea and vomiting.  Musculoskeletal:       Negative for muscle weakness  Psychiatric/Behavioral: Positive for depression. Negative for suicidal ideas.       Positive for stress    PHYSICAL EXAM: Blood pressure 125/80, pulse 82, temperature 97.9 F (36.6 C), temperature source Oral, height 5\' 3"  (1.6 m), weight 235 lb (106.6 kg), SpO2 95 %. Body mass index is 41.63 kg/m. Physical Exam  Constitutional: She is oriented to person, place, and time. She appears well-developed and well-nourished.  Cardiovascular: Normal rate.  Pulmonary/Chest: Effort  normal.  Musculoskeletal: Normal range of motion.  Neurological: She is oriented to person, place, and time.  Skin: Skin is warm and dry.  Psychiatric: She expresses no homicidal and no suicidal ideation.  Vitals reviewed.   RECENT LABS AND TESTS: BMET    Component Value Date/Time   NA 140 02/05/2018 1055   K 4.5 02/05/2018 1055   CL 105 02/05/2018 1055   CO2 22 02/05/2018 1055   GLUCOSE 91 02/05/2018 1055   GLUCOSE 84 11/10/2015 0527   BUN 10 02/05/2018 1055   CREATININE 0.62 02/05/2018 1055   CREATININE 0.88 02/02/2015 1458   CREATININE 0.88 02/02/2015 1458   CALCIUM 9.3 02/05/2018 1055   GFRNONAA 108 02/05/2018 1055   GFRAA 125 02/05/2018 1055   Lab Results  Component Value Date   HGBA1C 5.4 02/05/2018   HGBA1C 6.3 (H) 09/14/2015   HGBA1C (H) 02/12/2011    6.2 (NOTE)                                                                       According to the ADA Clinical Practice Recommendations for 2011, when HbA1c is used as a screening test:   >=6.5%   Diagnostic of Diabetes Mellitus           (if abnormal result  is confirmed)  5.7-6.4%   Increased risk of developing Diabetes Mellitus  References:Diagnosis and Classification of Diabetes Mellitus,Diabetes BDZH,2992,42(ASTMH 1):S62-S69 and Standards of Medical Care in         Diabetes - 2011,Diabetes Care,2011,34  (Suppl 1):S11-S61.   HGBA1C  6.2 (H) 02/02/2008   Lab Results  Component Value Date   INSULIN 11.0 01/21/2018   CBC    Component Value Date/Time   WBC 5.8 02/05/2018 1055   WBC 3.8 (L) 11/10/2015 0527   RBC 4.43 02/05/2018 1055   RBC 4.46 11/10/2015 0527   HGB 12.4 02/05/2018 1055   HCT 37.3 02/05/2018 1055   PLT 121 (L) 11/10/2015 0527   MCV 84 02/05/2018 1055   MCH 28.0 02/05/2018 1055   MCH 27.4 11/10/2015 0527   MCHC 33.2 02/05/2018 1055   MCHC 31.3 11/10/2015 0527   RDW 13.9 02/05/2018 1055   LYMPHSABS 2.6 02/05/2018 1055   MONOABS 0.8 11/09/2015 0515   EOSABS 0.1 02/05/2018 1055   BASOSABS 0.0  02/05/2018 1055   Iron/TIBC/Ferritin/ %Sat No results found for: IRON, TIBC, FERRITIN, IRONPCTSAT Lipid Panel     Component Value Date/Time   CHOL 233 (H) 02/05/2018 1055   TRIG 157 (H) 02/05/2018 1055   HDL 51 02/05/2018 1055   CHOLHDL 4.5 02/12/2008 0540   VLDL 25 02/12/2008 0540   LDLCALC 151 (H) 02/05/2018 1055   Hepatic Function Panel     Component Value Date/Time   PROT 6.8 02/05/2018 1055   ALBUMIN 4.2 02/05/2018 1055   AST 16 02/05/2018 1055   ALT 11 02/05/2018 1055   ALKPHOS 110 02/05/2018 1055   BILITOT 0.3 02/05/2018 1055      Component Value Date/Time   TSH 0.329 (L) 03/16/2018 1618   TSH 1.180 01/21/2018 1056   Results for MARDIE, KELLEN (MRN [id number]) as of 04/27/2018 17:19  Ref. Range 01/21/2018 10:56  Vitamin D, 25-Hydroxy Latest Ref Range: 30.0 - 100.0 ng/mL 28.8 (L)   ASSESSMENT AND PLAN: Other depression - with emotional eating  - Plan: buPROPion (WELLBUTRIN SR) 150 MG 12 hr tablet  Vitamin D deficiency  At risk for osteoporosis  Class 3 severe obesity with serious comorbidity and body mass index (BMI) of 40.0 to 44.9 in adult, unspecified obesity type ([place])  PLAN:  Vitamin D Deficiency Kristina [name] was informed that low vitamin D levels contributes to fatigue and are associated with obesity, breast, and colon cancer. She agrees to continue to take prescription Vit D @50 ,000 IU every week and will follow up for routine testing of vitamin D, at least 2-3 times per year. She was informed of the risk of over-replacement of vitamin D and agrees to not increase her dose unless she discusses this with [place] first.  At risk for osteopenia and osteoporosis Kristina [name] is at risk for osteopenia and osteoporosis due to her vitamin D deficiency. She was encouraged to take her vitamin D and follow her higher calcium diet and increase strengthening exercise to help strengthen her bones and decrease her risk of osteopenia and osteoporosis.  Depression with Emotional Eating  Behaviors We discussed behavior modification techniques today to help Kristina [name] deal with her emotional eating and depression. She has agreed to take Wellbutrin SR 150 mg BID #60 with no refills (has some at home) and follow up as directed.  Obesity Kristina [name] is currently in the action stage of change. As such, her goal is to continue with weight loss efforts She has agreed to follow the Category 2 plan Kristina [name] has been instructed to work up to a goal of 150 minutes of combined cardio and strengthening exercise per week for weight loss and overall health benefits. We discussed the following Behavioral Modification Strategies today: increasing lean protein intake, increasing vegetables, work on meal planning and  easy cooking plans and emotional eating strategies  Kristina [name] has agreed to follow up with our clinic in 2 weeks. She was informed of the importance of frequent follow up visits to maximize her success with intensive lifestyle modifications for her multiple health conditions.   OBESITY BEHAVIORAL INTERVENTION VISIT  Today's visit was # 6 out of 22.  Starting weight: 248 lbs Starting date: 01/21/18 Today's weight : 235 lbs Today's date: 04/23/2018 Total lbs lost to date: 13    ASK: We discussed the diagnosis of obesity with Kristina [name] today and Kristina [name] agreed to give [place] permission to discuss obesity behavioral modification therapy today.  ASSESS: Kristina [name] has the diagnosis of obesity and her BMI today is 41.64 Kristina [name] is in the action stage of change   ADVISE: Kristina [name] was educated on the multiple health risks of obesity as well as the benefit of weight loss to improve her health. She was advised of the need for long term treatment and the importance of lifestyle modifications.  AGREE: Multiple dietary modification options and treatment options were discussed and  Kristina [name] agreed to the above obesity treatment plan.  I, Doreene Nest, am acting as transcriptionist for Eber Jones,  MD  I have reviewed the above documentation for accuracy and completeness, and I agree with the above. - Ilene Qua, MD

## 2018-05-07 ENCOUNTER — Ambulatory Visit: Payer: Self-pay | Admitting: Internal Medicine

## 2018-05-08 ENCOUNTER — Other Ambulatory Visit (INDEPENDENT_AMBULATORY_CARE_PROVIDER_SITE_OTHER): Payer: Self-pay | Admitting: Family Medicine

## 2018-05-08 DIAGNOSIS — E559 Vitamin D deficiency, unspecified: Secondary | ICD-10-CM

## 2018-05-12 ENCOUNTER — Ambulatory Visit (INDEPENDENT_AMBULATORY_CARE_PROVIDER_SITE_OTHER): Payer: Medicare Other | Admitting: Family Medicine

## 2018-05-12 VITALS — BP 115/78 | HR 81 | Temp 97.7°F | Ht 63.0 in | Wt 230.0 lb

## 2018-05-12 DIAGNOSIS — E559 Vitamin D deficiency, unspecified: Secondary | ICD-10-CM | POA: Diagnosis not present

## 2018-05-12 DIAGNOSIS — E66813 Obesity, class 3: Secondary | ICD-10-CM

## 2018-05-12 DIAGNOSIS — F3289 Other specified depressive episodes: Secondary | ICD-10-CM

## 2018-05-12 DIAGNOSIS — B379 Candidiasis, unspecified: Secondary | ICD-10-CM

## 2018-05-12 DIAGNOSIS — Z6841 Body Mass Index (BMI) 40.0 and over, adult: Secondary | ICD-10-CM

## 2018-05-12 DIAGNOSIS — E038 Other specified hypothyroidism: Secondary | ICD-10-CM

## 2018-05-12 MED ORDER — NYSTATIN 100000 UNIT/GM EX POWD
Freq: Two times a day (BID) | CUTANEOUS | 0 refills | Status: DC
Start: 2018-05-12 — End: 2018-06-15

## 2018-05-12 MED ORDER — VITAMIN D (ERGOCALCIFEROL) 1.25 MG (50000 UNIT) PO CAPS: 50000.0000 [IU] | ORAL_CAPSULE | ORAL | 0 refills | Status: DC

## 2018-05-13 LAB — TSH: TSH: 0.593 u[IU]/mL (ref 0.450–4.500)

## 2018-05-13 NOTE — Progress Notes (Signed)
Office: [phone]  /  Fax: [phone]   HPI:   Chief Complaint: OBESITY Kristina [name] is here to discuss her progress with her obesity treatment plan. She is on the Category 2 plan and is following hereating plan approximately 80 % of the time. She states she is exercising 0 minutes 0 times per week. Kristina [name] has been doing well on journaling, but has not been using the app consistently. She has been consistent with calorie and protein goal. Her weight is 230 lb (104.3 kg) today and has had a weight loss of 5 pounds over a period of 3 weeks since her last visit. She has lost 18 lbs since starting treatment with [place].  Depression with emotional eating behaviors [name] is struggling with emotional eating and using food for comfort to the extent that it is negatively impacting her health. [name] sometimes feels she is out of control and then feels guilty that she made poor food choices. She has been noticing that 2 doses of Wellbutrin is improving cravings. She shows no sign of suicidal or homicidal ideations.  Candidiasis [name] has excoriation in her axilla and groin.  Vitamin D deficiency Kristina [name] has a diagnosis of vitamin D deficiency. She is currently taking vit D. She admits fatigue and denies nausea, vomiting or muscle weakness.  Hypothyroid Kristina [name] has a diagnosis of hypothyroidism. She is on levothyroxine. She denies hot or cold intolerance or palpitations. Her last TSH was 6 weeks ago.   ALLERGIES: Allergies  Allergen Reactions  . Ampicillin Anaphylaxis    Has patient had a PCN reaction causing immediate rash, facial/tongue/throat swelling, SOB or lightheadedness with hypotension: Yes Has patient had a PCN reaction causing severe rash involving mucus membranes or skin necrosis: Yes Has patient had a PCN reaction that required hospitalization Yes Has patient had a PCN reaction occurring within the last 10 years: No If all of the above answers are "NO", then may proceed with Cephalosporin  use.   . Codeine Hives and Shortness Of Breath  . Sulfonamide Derivatives Itching and Rash    MEDICATIONS: Current Outpatient Medications on File Prior to Visit  Medication Sig Dispense Refill  . buPROPion (WELLBUTRIN SR) 150 MG 12 hr tablet Take 1 tablet (150 mg total) by mouth 2 (two) times daily. 60 tablet 0  . dexlansoprazole (DEXILANT) 60 MG capsule Take 1 capsule (60 mg total) by mouth daily before breakfast. 90 capsule 3  . levothyroxine (SYNTHROID, LEVOTHROID) 137 MCG tablet Take 1 tablet (137 mcg total) by mouth daily before breakfast. 30 tablet 0  . metFORMIN (GLUCOPHAGE) 500 MG tablet TAKE 1 TABLET BY MOUTH ONCE DAILY WITH BREAKFAST 30 tablet 0  . nystatin-triamcinolone ointment (MYCOLOG) Apply 1 application topically 2 (two) times daily. 30 g 1   Current Facility-Administered Medications on File Prior to Visit  Medication Dose Route Frequency Provider Last Rate Last Dose  . 0.9 %  sodium chloride infusion  500 mL Intravenous Once [name], MD        PAST MEDICAL HISTORY: Past Medical History:  Diagnosis Date  . Allergic rhinitis   . Anxiety   . Asthma   . Bulging disc   . Cancer (HCC)    cervical cancer   . Chronic back pain   . Chronic cough   . Depression   . Diabetes mellitus    diet control only  . Diverticulosis   . Dyspnea    chronic  . Family history of adverse reaction to anesthesia    pts  daughter had reaction to profolol - difficulty with breathing   . GERD (gastroesophageal reflux disease)   . High cholesterol   . History of bronchitis   . Hx of adenomatous and sessile serrated colonic polyps   . Hypertension   . Hypothyroidism   . Lumbosacral spondylosis without myelopathy    history bulging disc-chronic pain(level 3 to 8).  . Normal cardiac stress test 02/2015   with normal EF  . Numbness    feet bilat comes and goes   . Obesity   . Obstructive sleep apnea    uses c-pap at night  . Palpitations   . Pneumonia    last episode 2012    . PONV (postoperative nausea and vomiting)   . Prediabetes   . Restless leg   . Vitamin D deficiency   . Vocal cord dysfunction     PAST SURGICAL HISTORY: Past Surgical History:  Procedure Laterality Date  . ABDOMINAL HYSTERECTOMY  1996  . CHOLECYSTECTOMY  1994  . COLONOSCOPY WITH PROPOFOL N/A 03/10/2015   Procedure: COLONOSCOPY WITH PROPOFOL;  Surgeon: [name], MD;  Location: [facility];  Service: Endoscopy;  Laterality: N/A;  . ESOPHAGOGASTRODUODENOSCOPY N/A 10/27/2013   Procedure: ESOPHAGOGASTRODUODENOSCOPY (EGD);  Surgeon: [name], MD;  Location: [facility];  Service: Endoscopy;  Laterality: N/A;  . KNEE ARTHROSCOPY Right   . LAPAROSCOPIC GASTRIC SLEEVE RESECTION WITH HIATAL HERNIA REPAIR N/A 09/19/2015   Procedure: LAPAROSCOPIC GASTRIC SLEEVE RESECTION WITH HIATAL HERNIA REPAIR;  Surgeon: [name], MD;  Location: [facility];  Service: General;  Laterality: N/A;  . NOSE SURGERY  2004  . ovaries removed    . TUBAL LIGATION    . UPPER GI ENDOSCOPY  09/19/2015   Procedure: UPPER GI ENDOSCOPY;  Surgeon: [name], MD;  Location: [facility];  Service: General;;    SOCIAL HISTORY: Social History   Tobacco Use  . Smoking status: Never Smoker  . Smokeless tobacco: Never Used  Substance Use Topics  . Alcohol use: No  . Drug use: No    FAMILY HISTORY: Family History  Problem Relation Age of Onset  . Emphysema Mother   . Allergies Mother   . Heart disease Mother   . Asthma Mother   . Ovarian cancer Mother   . Hypertension Mother   . Diabetes Mother   . High Cholesterol Mother   . Stroke Mother   . Thyroid disease Mother   . Depression Mother   . Anxiety disorder Mother   . Heart disease Father        deceased at age 42 from heart attack  . Sudden death Father   . High Cholesterol Father   . High blood pressure Father   . Stroke Maternal Grandmother   . Heart disease Maternal Grandmother   . Cancer Maternal Grandfather   . Heart disease Paternal  Grandmother   . Multiple sclerosis Paternal Grandmother   . Heart attack Paternal Grandfather   . Thyroid disease Brother        died at 55  . Heart attack Brother        died at 80  . COPD Brother   . Colon cancer Brother 39  . Hyperthyroidism Sister   . Hypertension Brother   . Hypertension Daughter   . Allergies Child   . Asthma Son   . Asthma Brother   . Asthma Brother   . Lung cancer Maternal Aunt   . COPD Sister  died at 26  . Sudden death Sister   . Asthma Son     ROS: Review of Systems  Constitutional: Positive for malaise/fatigue and weight loss.  Cardiovascular: Negative for palpitations.  Gastrointestinal: Negative for nausea and vomiting.  Musculoskeletal:       Negative for muscle weakness.  Skin:       Positive for excoriation.  Endo/Heme/Allergies:       Negative for heat intolerance. Negative for cold intolerance.  Psychiatric/Behavioral: Positive for depression. Negative for suicidal ideas.       Negative for homicidal ideations.    PHYSICAL EXAM: Blood pressure 115/78, pulse 81, temperature 97.7 F (36.5 C), temperature source Oral, height 5\' 3"  (1.6 m), weight 230 lb (104.3 kg), SpO2 97 %. Body mass index is 40.74 kg/m. Physical Exam  Constitutional: She is oriented to person, place, and time. She appears well-developed and well-nourished.  Cardiovascular: Normal rate.  Pulmonary/Chest: Effort normal.  Neurological: She is oriented to person, place, and time.  Skin: Skin is warm and dry.  Psychiatric: She has a normal mood and affect. Her behavior is normal.  Vitals reviewed.   RECENT LABS AND TESTS: BMET    Component Value Date/Time   NA 140 02/05/2018 1055   K 4.5 02/05/2018 1055   CL 105 02/05/2018 1055   CO2 22 02/05/2018 1055   GLUCOSE 91 02/05/2018 1055   GLUCOSE 84 11/10/2015 0527   BUN 10 02/05/2018 1055   CREATININE 0.62 02/05/2018 1055   CREATININE 0.88 02/02/2015 1458   CREATININE 0.88 02/02/2015 1458   CALCIUM  9.3 02/05/2018 1055   GFRNONAA 108 02/05/2018 1055   GFRAA 125 02/05/2018 1055   Lab Results  Component Value Date   HGBA1C 5.4 02/05/2018   HGBA1C 6.3 (H) 09/14/2015   HGBA1C (H) 02/12/2011    6.2 (NOTE)                                                                       According to the ADA Clinical Practice Recommendations for 2011, when HbA1c is used as a screening test:   >=6.5%   Diagnostic of Diabetes Mellitus           (if abnormal result  is confirmed)  5.7-6.4%   Increased risk of developing Diabetes Mellitus  References:Diagnosis and Classification of Diabetes Mellitus,Diabetes CZYS,0630,16(WFUXN 1):S62-S69 and Standards of Medical Care in         Diabetes - 2011,Diabetes Care,2011,34  (Suppl 1):S11-S61.   HGBA1C 6.2 (H) 02/02/2008   Lab Results  Component Value Date   INSULIN 11.0 01/21/2018   CBC    Component Value Date/Time   WBC 5.8 02/05/2018 1055   WBC 3.8 (L) 11/10/2015 0527   RBC 4.43 02/05/2018 1055   RBC 4.46 11/10/2015 0527   HGB 12.4 02/05/2018 1055   HCT 37.3 02/05/2018 1055   PLT 121 (L) 11/10/2015 0527   MCV 84 02/05/2018 1055   MCH 28.0 02/05/2018 1055   MCH 27.4 11/10/2015 0527   MCHC 33.2 02/05/2018 1055   MCHC 31.3 11/10/2015 0527   RDW 13.9 02/05/2018 1055   LYMPHSABS 2.6 02/05/2018 1055   MONOABS 0.8 11/09/2015 0515   EOSABS 0.1 02/05/2018 1055   BASOSABS 0.0 02/05/2018 1055  Iron/TIBC/Ferritin/ %Sat No results found for: IRON, TIBC, FERRITIN, IRONPCTSAT Lipid Panel     Component Value Date/Time   CHOL 233 (H) 02/05/2018 1055   TRIG 157 (H) 02/05/2018 1055   HDL 51 02/05/2018 1055   CHOLHDL 4.5 02/12/2008 0540   VLDL 25 02/12/2008 0540   LDLCALC 151 (H) 02/05/2018 1055   Hepatic Function Panel     Component Value Date/Time   PROT 6.8 02/05/2018 1055   ALBUMIN 4.2 02/05/2018 1055   AST 16 02/05/2018 1055   ALT 11 02/05/2018 1055   ALKPHOS 110 02/05/2018 1055   BILITOT 0.3 02/05/2018 1055      Component Value Date/Time    TSH 0.593 05/12/2018 1451   TSH 0.329 (L) 03/16/2018 1618   TSH 1.180 01/21/2018 1056   Results for GRACIELA, PLATO (MRN [id number]) as of 05/13/2018 09:57  Ref. Range 01/21/2018 10:56  Vitamin D, 25-Hydroxy Latest Ref Range: 30.0 - 100.0 ng/mL 28.8 (L)   ASSESSMENT AND PLAN: Candidiasis - Plan: nystatin (MYCOSTATIN/NYSTOP) powder  Vitamin D deficiency - Plan: Vitamin D, Ergocalciferol, (DRISDOL) 50000 units CAPS capsule  Other specified hypothyroidism - Plan: TSH  Other depression - with emotional eating  Class 3 severe obesity with serious comorbidity and body mass index (BMI) of 40.0 to 44.9 in adult, unspecified obesity type ([place])  PLAN: Depression with Emotional Eating Behaviors We discussed behavior modification techniques today to help Starlynn deal with her emotional eating and depression. Shehas agreed to continue to take Wellbutrin SR 150 mg 2 times a day and agreed to follow up as directed.  Candidiasis Shenicka agrees to start Nystatin Powder 100,000 units/gram and apply to affected area 2 times a day. She agrees to follow up as directed.  Vitamin D Deficiency Annesha was informed that low vitamin D levels contributes to fatigue and are associated with obesity, breast, and colon cancer. She agrees to continue to take prescription Vit D @50 ,000 IU every week #4 with no refills and will follow up for routine testing of vitamin D, at least 2-3 times per year. She was informed of the risk of over-replacement of vitamin D and agrees to not increase her dose unless she discusses this with [place] first. She agrees to follow up in 2 weeks.  Hypothyroid Robinn was informed of the importance of good thyroid control to help with weight loss efforts. She was also informed that supertheraputic thyroid levels are dangerous and will not improve weight loss results. We will check her TSH today.  Obesity Dave is currently in the action stage of change. As such, her goal is to continue with weight loss  efforts. She has agreed to keep a food journal with 1150 to 1300 calories and 85+ grams of  protein daily. Milayna has been instructed to work up to a goal of 150 minutes of combined cardio and strengthening exercise per week for weight loss and overall health benefits. We discussed the following Behavioral Modification Strategies today: increasing lean protein intake, increasing vegetables, work on meal planning and easy cooking plans, planning for success, and keep a strict food journal.  Belina has agreed to follow up with our clinic in 2 weeks. She was informed of the importance of frequent follow up visits to maximize her success with intensive lifestyle modifications for her multiple health conditions.   OBESITY BEHAVIORAL INTERVENTION VISIT  Today's visit was # 7  Starting weight: 248 lbs Starting date: 01/21/18 Today's weight :  230 lb (104.3 kg)  Today's date: 05/12/2018 Total  lbs lost to date: 18 At least 15 minutes were spent on discussing the following behavioral intervention visit.   ASK: We discussed the diagnosis of obesity with Princess Perna today and Vinessa agreed to give [place] permission to discuss obesity behavioral modification therapy today.  ASSESS: Quatisha has the diagnosis of obesity and her BMI today is 40.75. Dulcemaria is in the action stage of change.   ADVISE: Peytin was educated on the multiple health risks of obesity as well as the benefit of weight loss to improve her health. She was advised of the need for long term treatment and the importance of lifestyle modifications to improve her current health and to decrease her risk of future health problems.  AGREE: Multiple dietary modification options and treatment options were discussed and Londyn agreed to follow the recommendations documented in the above note.  ARRANGE: Georgina was educated on the importance of frequent visits to treat obesity as outlined per CMS and USPSTF guidelines and agreed to schedule her next  follow up appointment today.  I, Marcille Blanco, am acting as Location manager for Eber Jones, MD  I have reviewed the above documentation for accuracy and completeness, and I agree with the above. - Ilene Qua, MD

## 2018-05-20 ENCOUNTER — Other Ambulatory Visit (INDEPENDENT_AMBULATORY_CARE_PROVIDER_SITE_OTHER): Payer: Self-pay | Admitting: Family Medicine

## 2018-05-20 DIAGNOSIS — E8881 Metabolic syndrome: Secondary | ICD-10-CM

## 2018-05-21 DIAGNOSIS — M545 Low back pain: Secondary | ICD-10-CM | POA: Diagnosis not present

## 2018-05-21 DIAGNOSIS — M5416 Radiculopathy, lumbar region: Secondary | ICD-10-CM | POA: Diagnosis not present

## 2018-05-22 ENCOUNTER — Encounter (INDEPENDENT_AMBULATORY_CARE_PROVIDER_SITE_OTHER): Payer: Self-pay | Admitting: Family Medicine

## 2018-05-22 ENCOUNTER — Ambulatory Visit: Payer: Self-pay | Admitting: Internal Medicine

## 2018-05-25 ENCOUNTER — Telehealth (INDEPENDENT_AMBULATORY_CARE_PROVIDER_SITE_OTHER): Payer: Self-pay | Admitting: Family Medicine

## 2018-05-25 NOTE — Telephone Encounter (Signed)
Telephone message from pt from 09/06:  I need refill approval for my metformin  I think insurance wants 90 day supply but I'm going to be out of it by Sunday... I called [facility] @ [place] and they said waiting on authorization for your office .. thanks have a great weekend!!  I need refill approval for my metformin  I think insurance wants 90 day supply but I'm going to be out of it by Sunday... I called [facility] @ [place] and they said waiting on authorization for your office .. thanks have a great weekend!!   Pt calls 09/09 upset as she has been out of Metformin for three days. Please advise.

## 2018-05-25 NOTE — Telephone Encounter (Signed)
Prescription sent to the pt pharmacy. [facility], [place]

## 2018-05-28 ENCOUNTER — Ambulatory Visit (INDEPENDENT_AMBULATORY_CARE_PROVIDER_SITE_OTHER): Payer: Medicare Other | Admitting: Family Medicine

## 2018-05-28 VITALS — BP 136/84 | HR 75 | Temp 97.4°F | Ht 63.0 in | Wt 228.0 lb

## 2018-05-28 DIAGNOSIS — E119 Type 2 diabetes mellitus without complications: Secondary | ICD-10-CM | POA: Diagnosis not present

## 2018-05-28 DIAGNOSIS — F3289 Other specified depressive episodes: Secondary | ICD-10-CM

## 2018-05-28 DIAGNOSIS — Z6841 Body Mass Index (BMI) 40.0 and over, adult: Secondary | ICD-10-CM | POA: Diagnosis not present

## 2018-05-28 MED ORDER — BUPROPION HCL ER (SR) 150 MG PO TB12: 150.0000 mg | ORAL_TABLET | Freq: Two times a day (BID) | ORAL | 0 refills | Status: DC

## 2018-05-29 DIAGNOSIS — Z23 Encounter for immunization: Secondary | ICD-10-CM | POA: Diagnosis not present

## 2018-05-29 DIAGNOSIS — R7301 Impaired fasting glucose: Secondary | ICD-10-CM | POA: Diagnosis not present

## 2018-05-29 DIAGNOSIS — E039 Hypothyroidism, unspecified: Secondary | ICD-10-CM | POA: Diagnosis not present

## 2018-05-29 DIAGNOSIS — I10 Essential (primary) hypertension: Secondary | ICD-10-CM | POA: Diagnosis not present

## 2018-05-29 DIAGNOSIS — K219 Gastro-esophageal reflux disease without esophagitis: Secondary | ICD-10-CM | POA: Diagnosis not present

## 2018-06-01 NOTE — Progress Notes (Signed)
Office: [phone]  /  Fax: [phone]   HPI:   Chief Complaint: OBESITY Kristina [name] is here to discuss her progress with her obesity treatment plan. She is on the keep a food journal with 1150-1300 calories and 85+ grams of protein daily and is following her eating plan approximately 85 % of the time. She states she is exercising 0 minutes 0 times per week. Kristina [name] journaled 7 out of the past 10 days, hitting approximately 1340 calories. She notes a lot of stress involving her son's addiction.  Her weight is 228 lb (103.4 kg) today and has had a weight loss of 2 pounds over a period of 2 weeks since her last visit. She has lost 20 lbs since starting treatment with [place].  Diabetes II Kristina [name] has a diagnosis of diabetes type II. Kristina [name] denies GI side effects of metformin, she still notes carbohydrate cravings with emotional eating. She denies any hypoglycemic episodes. Last A1c was 5.4. She has been working on intensive lifestyle modifications including diet, exercise, and weight loss to help control her blood glucose levels.  Depression with emotional eating behaviors Kristina [name] notes stress and struggling with emotional eating and using food for comfort to the extent that it is negatively impacting her health. She often snacks when she is not hungry. Kristina [name] sometimes feels she is out of control and then feels guilty that she made poor food choices. She has been working on behavior modification techniques to help reduce her emotional eating and has been somewhat successful. She shows no sign of suicidal or homicidal ideations.  Depression screen Kristina [name] 2/9 01/21/2018 04/19/2016 01/24/2016 11/20/2015 10/03/2015  Decreased Interest 3 0 0 0 0  Down, Depressed, Hopeless 3 0 0 0 0  PHQ - 2 Score 6 0 0 0 0  Altered sleeping 2 - - - -  Tired, decreased energy 3 - - - -  Change in appetite 3 - - - -  Feeling bad or failure about yourself  3 - - - -  Trouble concentrating 1 - - - -  Moving slowly or fidgety/restless 1 - -  - -  Suicidal thoughts 0 - - - -  PHQ-9 Score 19 - - - -  Difficult doing work/chores Very difficult - - - -    ALLERGIES: Allergies  Allergen Reactions  . Ampicillin Anaphylaxis    Has patient had a PCN reaction causing immediate rash, facial/tongue/throat swelling, SOB or lightheadedness with hypotension: Yes Has patient had a PCN reaction causing severe rash involving mucus membranes or skin necrosis: Yes Has patient had a PCN reaction that required hospitalization Yes Has patient had a PCN reaction occurring within the last 10 years: No If all of the above answers are "NO", then may proceed with Cephalosporin use.   . Codeine Hives and Shortness Of Breath  . Sulfonamide Derivatives Itching and Rash    MEDICATIONS: Current Outpatient Medications on File Prior to Visit  Medication Sig Dispense Refill  . dexlansoprazole (DEXILANT) 60 MG capsule Take 1 capsule (60 mg total) by mouth daily before breakfast. 90 capsule 3  . levothyroxine (SYNTHROID, LEVOTHROID) 137 MCG tablet Take 1 tablet (137 mcg total) by mouth daily before breakfast. 30 tablet 0  . metFORMIN (GLUCOPHAGE) 500 MG tablet TAKE 1 TABLET BY MOUTH ONCE DAILY WITH BREAKFAST 30 tablet 0  . nystatin (MYCOSTATIN/NYSTOP) powder Apply topically 2 (two) times daily. 15 g 0  . nystatin-triamcinolone ointment (MYCOLOG) Apply 1 application topically 2 (two) times daily. 30 g 1  .  Vitamin D, Ergocalciferol, (DRISDOL) 50000 units CAPS capsule Take 1 capsule (50,000 Units total) by mouth every 7 (seven) days. 4 capsule 0   Current Facility-Administered Medications on File Prior to Visit  Medication Dose Route Frequency Provider Last Rate Last Dose  . 0.9 %  sodium chloride infusion  500 mL Intravenous Once [name], MD        PAST MEDICAL HISTORY: Past Medical History:  Diagnosis Date  . Allergic rhinitis   . Anxiety   . Asthma   . Bulging disc   . Cancer (HCC)    cervical cancer   . Chronic back pain   . Chronic  cough   . Depression   . Diabetes mellitus    diet control only  . Diverticulosis   . Dyspnea    chronic  . Family history of adverse reaction to anesthesia    pts daughter had reaction to profolol - difficulty with breathing   . GERD (gastroesophageal reflux disease)   . High cholesterol   . History of bronchitis   . Hx of adenomatous and sessile serrated colonic polyps   . Hypertension   . Hypothyroidism   . Lumbosacral spondylosis without myelopathy    history bulging disc-chronic pain(level 3 to 8).  . Normal cardiac stress test 02/2015   with normal EF  . Numbness    feet bilat comes and goes   . Obesity   . Obstructive sleep apnea    uses c-pap at night  . Palpitations   . Pneumonia    last episode 2012  . PONV (postoperative nausea and vomiting)   . Prediabetes   . Restless leg   . Vitamin D deficiency   . Vocal cord dysfunction     PAST SURGICAL HISTORY: Past Surgical History:  Procedure Laterality Date  . ABDOMINAL HYSTERECTOMY  1996  . CHOLECYSTECTOMY  1994  . COLONOSCOPY WITH PROPOFOL N/A 03/10/2015   Procedure: COLONOSCOPY WITH PROPOFOL;  Surgeon: [name], MD;  Location: [facility];  Service: Endoscopy;  Laterality: N/A;  . ESOPHAGOGASTRODUODENOSCOPY N/A 10/27/2013   Procedure: ESOPHAGOGASTRODUODENOSCOPY (EGD);  Surgeon: [name], MD;  Location: [facility];  Service: Endoscopy;  Laterality: N/A;  . KNEE ARTHROSCOPY Right   . LAPAROSCOPIC GASTRIC SLEEVE RESECTION WITH HIATAL HERNIA REPAIR N/A 09/19/2015   Procedure: LAPAROSCOPIC GASTRIC SLEEVE RESECTION WITH HIATAL HERNIA REPAIR;  Surgeon: [name], MD;  Location: [facility];  Service: General;  Laterality: N/A;  . NOSE SURGERY  2004  . ovaries removed    . TUBAL LIGATION    . UPPER GI ENDOSCOPY  09/19/2015   Procedure: UPPER GI ENDOSCOPY;  Surgeon: [name], MD;  Location: [facility];  Service: General;;    SOCIAL HISTORY: Social History   Tobacco Use  . Smoking status: Never Smoker  .  Smokeless tobacco: Never Used  Substance Use Topics  . Alcohol use: No  . Drug use: No    FAMILY HISTORY: Family History  Problem Relation Age of Onset  . Emphysema Mother   . Allergies Mother   . Heart disease Mother   . Asthma Mother   . Ovarian cancer Mother   . Hypertension Mother   . Diabetes Mother   . High Cholesterol Mother   . Stroke Mother   . Thyroid disease Mother   . Depression Mother   . Anxiety disorder Mother   . Heart disease Father        deceased at age 58 from heart attack  .  Sudden death Father   . High Cholesterol Father   . High blood pressure Father   . Stroke Maternal Grandmother   . Heart disease Maternal Grandmother   . Cancer Maternal Grandfather   . Heart disease Paternal Grandmother   . Multiple sclerosis Paternal Grandmother   . Heart attack Paternal Grandfather   . Thyroid disease Brother        died at 76  . Heart attack Brother        died at 84  . COPD Brother   . Colon cancer Brother 43  . Hyperthyroidism Sister   . Hypertension Brother   . Hypertension Daughter   . Allergies Child   . Asthma Son   . Asthma Brother   . Asthma Brother   . Lung cancer Maternal Aunt   . COPD Sister        died at 48  . Sudden death Sister   . Asthma Son     ROS: Review of Systems  Constitutional: Positive for weight loss.  Endo/Heme/Allergies:       Negative hypoglycemia  Psychiatric/Behavioral: Positive for depression. Negative for suicidal ideas.       + Stress    PHYSICAL EXAM: Blood pressure 136/84, pulse 75, temperature (!) 97.4 F (36.3 C), temperature source Oral, height 5\' 3"  (1.6 m), weight 228 lb (103.4 kg), SpO2 97 %. Body mass index is 40.39 kg/m. Physical Exam  Constitutional: She is oriented to person, place, and time. She appears well-developed and well-nourished.  Cardiovascular: Normal rate.  Pulmonary/Chest: Effort normal.  Musculoskeletal: Normal range of motion.  Neurological: She is oriented to person, place,  and time.  Skin: Skin is warm and dry.  Psychiatric: She has a normal mood and affect. Her behavior is normal.  Vitals reviewed.   RECENT LABS AND TESTS: BMET    Component Value Date/Time   NA 140 02/05/2018 1055   K 4.5 02/05/2018 1055   CL 105 02/05/2018 1055   CO2 22 02/05/2018 1055   GLUCOSE 91 02/05/2018 1055   GLUCOSE 84 11/10/2015 0527   BUN 10 02/05/2018 1055   CREATININE 0.62 02/05/2018 1055   CREATININE 0.88 02/02/2015 1458   CREATININE 0.88 02/02/2015 1458   CALCIUM 9.3 02/05/2018 1055   GFRNONAA 108 02/05/2018 1055   GFRAA 125 02/05/2018 1055   Lab Results  Component Value Date   HGBA1C 5.4 02/05/2018   HGBA1C 6.3 (H) 09/14/2015   HGBA1C (H) 02/12/2011    6.2 (NOTE)                                                                       According to the ADA Clinical Practice Recommendations for 2011, when HbA1c is used as a screening test:   >=6.5%   Diagnostic of Diabetes Mellitus           (if abnormal result  is confirmed)  5.7-6.4%   Increased risk of developing Diabetes Mellitus  References:Diagnosis and Classification of Diabetes Mellitus,Diabetes XBLT,9030,09(QZRAQ 1):S62-S69 and Standards of Medical Care in         Diabetes - 2011,Diabetes Care,2011,34  (Suppl 1):S11-S61.   HGBA1C 6.2 (H) 02/02/2008   Lab Results  Component Value Date   INSULIN 11.0 01/21/2018  CBC    Component Value Date/Time   WBC 5.8 02/05/2018 1055   WBC 3.8 (L) 11/10/2015 0527   RBC 4.43 02/05/2018 1055   RBC 4.46 11/10/2015 0527   HGB 12.4 02/05/2018 1055   HCT 37.3 02/05/2018 1055   PLT 121 (L) 11/10/2015 0527   MCV 84 02/05/2018 1055   MCH 28.0 02/05/2018 1055   MCH 27.4 11/10/2015 0527   MCHC 33.2 02/05/2018 1055   MCHC 31.3 11/10/2015 0527   RDW 13.9 02/05/2018 1055   LYMPHSABS 2.6 02/05/2018 1055   MONOABS 0.8 11/09/2015 0515   EOSABS 0.1 02/05/2018 1055   BASOSABS 0.0 02/05/2018 1055   Iron/TIBC/Ferritin/ %Sat No results found for: IRON, TIBC, FERRITIN,  IRONPCTSAT Lipid Panel     Component Value Date/Time   CHOL 233 (H) 02/05/2018 1055   TRIG 157 (H) 02/05/2018 1055   HDL 51 02/05/2018 1055   CHOLHDL 4.5 02/12/2008 0540   VLDL 25 02/12/2008 0540   LDLCALC 151 (H) 02/05/2018 1055   Hepatic Function Panel     Component Value Date/Time   PROT 6.8 02/05/2018 1055   ALBUMIN 4.2 02/05/2018 1055   AST 16 02/05/2018 1055   ALT 11 02/05/2018 1055   ALKPHOS 110 02/05/2018 1055   BILITOT 0.3 02/05/2018 1055      Component Value Date/Time   TSH 0.593 05/12/2018 1451   TSH 0.329 (L) 03/16/2018 1618   TSH 1.180 01/21/2018 1056    ASSESSMENT AND PLAN: Type 2 diabetes mellitus without complication, without long-term current use of insulin (HCC)  Other depression - with emotional eating  - Plan: buPROPion (WELLBUTRIN SR) 150 MG 12 hr tablet  Class 3 severe obesity with serious comorbidity and body mass index (BMI) of 40.0 to 44.9 in adult, unspecified obesity type ([place])  PLAN:  Diabetes II Kristina [name] has been given extensive diabetes education by myself today including ideal fasting and post-prandial blood glucose readings, individual ideal Hgb A1c goals and hypoglycemia prevention. We discussed the importance of good blood sugar control to decrease the likelihood of diabetic complications such as nephropathy, neuropathy, limb loss, blindness, coronary artery disease, and death. We discussed the importance of intensive lifestyle modification including diet, exercise and weight loss as the first line treatment for diabetes. Zylee agrees to continue taking metformin and she agrees to follow up with our clinic in 2 weeks.  Depression with Emotional Eating Behaviors We discussed behavior modification techniques today to help Kristina [name] deal with her emotional eating and depression. Kristina [name] agrees to continue taking bupropion SR 150 mg PO BID #60 and we will refill for 1 month. Kristina [name] agrees to follow up with our clinic in 2 weeks.  Obesity Kyrielle is  currently in the action stage of change. As such, her goal is to continue with weight loss efforts She has agreed to keep a food journal with 1150-1300 calories and 85+ grams of protein daily Pammie has been instructed to work up to a goal of 150 minutes of combined cardio and strengthening exercise per week for weight loss and overall health benefits. We discussed the following Behavioral Modification Strategies today: work on meal planning and easy cooking plans, planning for success, and keep a strict food journal   Khyla has agreed to follow up with our clinic in 2 weeks. She was informed of the importance of frequent follow up visits to maximize her success with intensive lifestyle modifications for her multiple health conditions.   OBESITY BEHAVIORAL INTERVENTION VISIT  Today's visit was # 8  Starting weight: 248 lbs Starting date: 01/21/18 Today's weight : 228 lbs  Today's date: 05/28/2018 Total lbs lost to date: 20 At least 15 minutes were spent on discussing the following behavioral intervention visit.   ASK: We discussed the diagnosis of obesity with Princess Perna today and Loann agreed to give [place] permission to discuss obesity behavioral modification therapy today.  ASSESS: Marayah has the diagnosis of obesity and her BMI today is 40.4 Anastashia is in the action stage of change   ADVISE: Emalina was educated on the multiple health risks of obesity as well as the benefit of weight loss to improve her health. She was advised of the need for long term treatment and the importance of lifestyle modifications to improve her current health and to decrease her risk of future health problems.  AGREE: Multiple dietary modification options and treatment options were discussed and  Kathlyne agreed to follow the recommendations documented in the above note.  ARRANGE: Bennetta was educated on the importance of frequent visits to treat obesity as outlined per CMS and USPSTF guidelines and agreed to  schedule her next follow up appointment today.  I, Trixie Dredge, am acting as transcriptionist for Ilene Qua, MD  I have reviewed the above documentation for accuracy and completeness, and I agree with the above. - Ilene Qua, MD

## 2018-06-03 ENCOUNTER — Other Ambulatory Visit (INDEPENDENT_AMBULATORY_CARE_PROVIDER_SITE_OTHER): Payer: Self-pay | Admitting: Family Medicine

## 2018-06-03 DIAGNOSIS — K219 Gastro-esophageal reflux disease without esophagitis: Secondary | ICD-10-CM | POA: Diagnosis not present

## 2018-06-03 DIAGNOSIS — Z0001 Encounter for general adult medical examination with abnormal findings: Secondary | ICD-10-CM | POA: Diagnosis not present

## 2018-06-03 DIAGNOSIS — F3289 Other specified depressive episodes: Secondary | ICD-10-CM

## 2018-06-03 DIAGNOSIS — R7301 Impaired fasting glucose: Secondary | ICD-10-CM | POA: Diagnosis not present

## 2018-06-03 DIAGNOSIS — I10 Essential (primary) hypertension: Secondary | ICD-10-CM | POA: Diagnosis not present

## 2018-06-03 DIAGNOSIS — E559 Vitamin D deficiency, unspecified: Secondary | ICD-10-CM

## 2018-06-03 DIAGNOSIS — E782 Mixed hyperlipidemia: Secondary | ICD-10-CM | POA: Diagnosis not present

## 2018-06-03 DIAGNOSIS — E038 Other specified hypothyroidism: Secondary | ICD-10-CM

## 2018-06-04 DIAGNOSIS — M5136 Other intervertebral disc degeneration, lumbar region: Secondary | ICD-10-CM | POA: Diagnosis not present

## 2018-06-07 ENCOUNTER — Other Ambulatory Visit (INDEPENDENT_AMBULATORY_CARE_PROVIDER_SITE_OTHER): Payer: Self-pay | Admitting: Family Medicine

## 2018-06-07 DIAGNOSIS — E038 Other specified hypothyroidism: Secondary | ICD-10-CM

## 2018-06-08 ENCOUNTER — Encounter (INDEPENDENT_AMBULATORY_CARE_PROVIDER_SITE_OTHER): Payer: Self-pay | Admitting: Family Medicine

## 2018-06-08 ENCOUNTER — Other Ambulatory Visit (INDEPENDENT_AMBULATORY_CARE_PROVIDER_SITE_OTHER): Payer: Self-pay | Admitting: Family Medicine

## 2018-06-08 DIAGNOSIS — E038 Other specified hypothyroidism: Secondary | ICD-10-CM

## 2018-06-08 MED ORDER — LEVOTHYROXINE SODIUM 137 MCG PO TABS: 137.0000 ug | ORAL_TABLET | Freq: Every day | ORAL | 0 refills | Status: DC

## 2018-06-15 ENCOUNTER — Ambulatory Visit (INDEPENDENT_AMBULATORY_CARE_PROVIDER_SITE_OTHER): Payer: Medicare Other | Admitting: Family Medicine

## 2018-06-15 VITALS — BP 114/80 | HR 75 | Temp 97.6°F | Ht 63.0 in | Wt 228.0 lb

## 2018-06-15 DIAGNOSIS — F3289 Other specified depressive episodes: Secondary | ICD-10-CM | POA: Diagnosis not present

## 2018-06-15 DIAGNOSIS — E559 Vitamin D deficiency, unspecified: Secondary | ICD-10-CM

## 2018-06-15 DIAGNOSIS — E038 Other specified hypothyroidism: Secondary | ICD-10-CM

## 2018-06-15 DIAGNOSIS — Z6841 Body Mass Index (BMI) 40.0 and over, adult: Secondary | ICD-10-CM

## 2018-06-15 DIAGNOSIS — B379 Candidiasis, unspecified: Secondary | ICD-10-CM

## 2018-06-15 MED ORDER — LEVOTHYROXINE SODIUM 125 MCG PO TABS: 125.0000 ug | ORAL_TABLET | Freq: Every day | ORAL | 0 refills | Status: DC

## 2018-06-15 MED ORDER — BUPROPION HCL ER (SR) 150 MG PO TB12: 150.0000 mg | ORAL_TABLET | Freq: Two times a day (BID) | ORAL | 0 refills | Status: DC

## 2018-06-15 MED ORDER — NYSTATIN 100000 UNIT/GM EX POWD: Freq: Two times a day (BID) | CUTANEOUS | 0 refills | Status: DC

## 2018-06-15 MED ORDER — VITAMIN D (ERGOCALCIFEROL) 1.25 MG (50000 UNIT) PO CAPS: 50000.0000 [IU] | ORAL_CAPSULE | ORAL | 0 refills | Status: DC

## 2018-06-15 NOTE — Progress Notes (Signed)
Office: [phone]  /  Fax: [phone]   HPI:   Chief Complaint: Kristina Kristina [name] is here to discuss her progress with her Kristina treatment plan. She is on the  keep a food journal with 1150-1300 calories and 85g protein  and is following her eating plan approximately 25 % of the time. She states she is exercising 0 minutes 0 times per week. Kristina [name] is currently struggling with emotionally eating secondary to son's drug abuse and addiction in addition to not seeing her grand children. She finds that journaling makes her accountable.   Her weight is 228 lb (103.4 kg) today and has not lost weight since her last visit. She has lost 20 lbs since starting treatment with [place].  Kristina Kristina [name] has a diagnosis of hypothyroidism. She is on levothyroxine. She denies hot or cold intolerance or palpitations, but does admit to ongoing fatigue.  Kristina D deficiency Kristina [name] has a diagnosis of Kristina D deficiency. She is currently taking vit D and denies nausea, vomiting or muscle weakness. Reports fatigue.    Ref. Range 01/21/2018 10:56  Kristina D, 25-Hydroxy Latest Ref Range: 30.0 - 100.0 ng/mL 28.8 (L)   Kristina Kristina [name] is having a flare up of Kristina with the increased temperature outside. No open sores noted.   ALLERGIES: Allergies  Allergen Reactions  . Ampicillin Anaphylaxis    Has patient had a PCN reaction causing immediate rash, facial/tongue/throat swelling, SOB or lightheadedness with hypotension: Yes Has patient had a PCN reaction causing severe rash involving mucus membranes or skin necrosis: Yes Has patient had a PCN reaction that required hospitalization Yes Has patient had a PCN reaction occurring within the last 10 years: No If all of the above answers are "NO", then may proceed with Cephalosporin use.   . Codeine Hives and Shortness Of Breath  . Sulfonamide Derivatives Itching and Rash    MEDICATIONS: Current Outpatient Medications on File Prior to Visit  Medication  Sig Dispense Refill  . buPROPion (WELLBUTRIN SR) 150 MG 12 hr tablet Take 1 tablet (150 mg total) by mouth 2 (two) times daily. 60 tablet 0  . dexlansoprazole (DEXILANT) 60 MG capsule Take 1 capsule (60 mg total) by mouth daily before breakfast. 90 capsule 3  . metFORMIN (GLUCOPHAGE) 500 MG tablet TAKE 1 TABLET BY MOUTH ONCE DAILY WITH BREAKFAST 30 tablet 0  . nystatin (MYCOSTATIN/NYSTOP) powder Apply topically 2 (two) times daily. 15 g 0  . nystatin-triamcinolone ointment (MYCOLOG) Apply 1 application topically 2 (two) times daily. 30 g 1  . Kristina D, Ergocalciferol, (DRISDOL) 50000 units CAPS capsule Take 1 capsule (50,000 Units total) by mouth every 7 (seven) days. 4 capsule 0   Current Facility-Administered Medications on File Prior to Visit  Medication Dose Route Frequency Provider Last Rate Last Dose  . 0.9 %  sodium chloride infusion  500 mL Intravenous Once [name], MD        PAST MEDICAL HISTORY: Past Medical History:  Diagnosis Date  . Allergic rhinitis   . Anxiety   . Asthma   . Bulging disc   . Cancer (HCC)    cervical cancer   . Chronic back pain   . Chronic cough   . Kristina   . Diabetes mellitus    diet control only  . Diverticulosis   . Dyspnea    chronic  . Family history of adverse reaction to anesthesia    pts daughter had reaction to profolol - difficulty with breathing   . GERD (gastroesophageal  reflux disease)   . High cholesterol   . History of bronchitis   . Hx of adenomatous and sessile serrated colonic polyps   . Hypertension   . Hypothyroidism   . Lumbosacral spondylosis without myelopathy    history bulging disc-chronic pain(level 3 to 8).  . Normal cardiac stress test 02/2015   with normal EF  . Numbness    feet bilat comes and goes   . Kristina   . Obstructive sleep apnea    uses c-pap at night  . Palpitations   . Pneumonia    last episode 2012  . PONV (postoperative nausea and vomiting)   . Prediabetes   . Restless leg   .  Kristina D deficiency   . Vocal cord dysfunction     PAST SURGICAL HISTORY: Past Surgical History:  Procedure Laterality Date  . ABDOMINAL HYSTERECTOMY  1996  . CHOLECYSTECTOMY  1994  . COLONOSCOPY WITH PROPOFOL N/A 03/10/2015   Procedure: COLONOSCOPY WITH PROPOFOL;  Surgeon: [name], MD;  Location: [facility];  Service: Endoscopy;  Laterality: N/A;  . ESOPHAGOGASTRODUODENOSCOPY N/A 10/27/2013   Procedure: ESOPHAGOGASTRODUODENOSCOPY (EGD);  Surgeon: [name], MD;  Location: [facility];  Service: Endoscopy;  Laterality: N/A;  . KNEE ARTHROSCOPY Right   . LAPAROSCOPIC GASTRIC SLEEVE RESECTION WITH HIATAL HERNIA REPAIR N/A 09/19/2015   Procedure: LAPAROSCOPIC GASTRIC SLEEVE RESECTION WITH HIATAL HERNIA REPAIR;  Surgeon: [name], MD;  Location: [facility];  Service: General;  Laterality: N/A;  . NOSE SURGERY  2004  . ovaries removed    . TUBAL LIGATION    . UPPER GI ENDOSCOPY  09/19/2015   Procedure: UPPER GI ENDOSCOPY;  Surgeon: [name], MD;  Location: [facility];  Service: General;;    SOCIAL HISTORY: Social History   Tobacco Use  . Smoking status: Never Smoker  . Smokeless tobacco: Never Used  Substance Use Topics  . Alcohol use: No  . Drug use: No    FAMILY HISTORY: Family History  Problem Relation Age of Onset  . Emphysema Mother   . Allergies Mother   . Heart disease Mother   . Asthma Mother   . Ovarian cancer Mother   . Hypertension Mother   . Diabetes Mother   . High Cholesterol Mother   . Stroke Mother   . Thyroid disease Mother   . Kristina Mother   . Anxiety disorder Mother   . Heart disease Father        deceased at age 25 from heart attack  . Sudden death Father   . High Cholesterol Father   . High blood pressure Father   . Stroke Maternal Grandmother   . Heart disease Maternal Grandmother   . Cancer Maternal Grandfather   . Heart disease Paternal Grandmother   . Multiple sclerosis Paternal Grandmother   . Heart attack Paternal  Grandfather   . Thyroid disease Brother        died at 69  . Heart attack Brother        died at 64  . COPD Brother   . Colon cancer Brother 30  . Hyperthyroidism Sister   . Hypertension Brother   . Hypertension Daughter   . Allergies Child   . Asthma Son   . Asthma Brother   . Asthma Brother   . Lung cancer Maternal Aunt   . COPD Sister        died at 53  . Sudden death Sister   . Asthma Son  ROS: Review of Systems  Constitutional: Positive for malaise/fatigue. Negative for weight loss.  Cardiovascular: Negative for palpitations.  Gastrointestinal: Negative for nausea and vomiting.  Musculoskeletal:       Negative for muscle weakness   Skin: Positive for rash.  Endo/Heme/Allergies:       Negative for hot/cold intolerance     PHYSICAL EXAM: Blood pressure 114/80, pulse 75, temperature 97.6 F (36.4 C), temperature source Oral, height 5\' 3"  (1.6 m), weight 228 lb (103.4 kg), SpO2 98 %. Body mass index is 40.39 kg/m. Physical Exam  Constitutional: She is oriented to person, place, and time. She appears well-developed and well-nourished.  HENT:  Head: Normocephalic.  Cardiovascular: Normal rate.  Pulmonary/Chest: Effort normal.  Musculoskeletal: Normal range of motion.  Neurological: She is alert and oriented to person, place, and time.  Skin: Skin is warm and dry.  Psychiatric: She has a normal mood and affect. Her behavior is normal.  Vitals reviewed.   RECENT LABS AND TESTS: BMET    Component Value Date/Time   NA 140 02/05/2018 1055   K 4.5 02/05/2018 1055   CL 105 02/05/2018 1055   CO2 22 02/05/2018 1055   GLUCOSE 91 02/05/2018 1055   GLUCOSE 84 11/10/2015 0527   BUN 10 02/05/2018 1055   CREATININE 0.62 02/05/2018 1055   CREATININE 0.88 02/02/2015 1458   CREATININE 0.88 02/02/2015 1458   CALCIUM 9.3 02/05/2018 1055   GFRNONAA 108 02/05/2018 1055   GFRAA 125 02/05/2018 1055   Lab Results  Component Value Date   HGBA1C 5.4 02/05/2018   HGBA1C  6.3 (H) 09/14/2015   HGBA1C (H) 02/12/2011    6.2 (NOTE)                                                                       According to the ADA Clinical Practice Recommendations for 2011, when HbA1c is used as a screening test:   >=6.5%   Diagnostic of Diabetes Mellitus           (if abnormal result  is confirmed)  5.7-6.4%   Increased risk of developing Diabetes Mellitus  References:Diagnosis and Classification of Diabetes Mellitus,Diabetes GMWN,0272,53(GUYQI 1):S62-S69 and Standards of Medical Care in         Diabetes - 2011,Diabetes Care,2011,34  (Suppl 1):S11-S61.   HGBA1C 6.2 (H) 02/02/2008   Lab Results  Component Value Date   INSULIN 11.0 01/21/2018   CBC    Component Value Date/Time   WBC 5.8 02/05/2018 1055   WBC 3.8 (L) 11/10/2015 0527   RBC 4.43 02/05/2018 1055   RBC 4.46 11/10/2015 0527   HGB 12.4 02/05/2018 1055   HCT 37.3 02/05/2018 1055   PLT 121 (L) 11/10/2015 0527   MCV 84 02/05/2018 1055   MCH 28.0 02/05/2018 1055   MCH 27.4 11/10/2015 0527   MCHC 33.2 02/05/2018 1055   MCHC 31.3 11/10/2015 0527   RDW 13.9 02/05/2018 1055   LYMPHSABS 2.6 02/05/2018 1055   MONOABS 0.8 11/09/2015 0515   EOSABS 0.1 02/05/2018 1055   BASOSABS 0.0 02/05/2018 1055   Iron/TIBC/Ferritin/ %Sat No results found for: IRON, TIBC, FERRITIN, IRONPCTSAT Lipid Panel     Component Value Date/Time   CHOL 233 (H) 02/05/2018 1055   TRIG  157 (H) 02/05/2018 1055   HDL 51 02/05/2018 1055   CHOLHDL 4.5 02/12/2008 0540   VLDL 25 02/12/2008 0540   LDLCALC 151 (H) 02/05/2018 1055   Hepatic Function Panel     Component Value Date/Time   PROT 6.8 02/05/2018 1055   ALBUMIN 4.2 02/05/2018 1055   AST 16 02/05/2018 1055   ALT 11 02/05/2018 1055   ALKPHOS 110 02/05/2018 1055   BILITOT 0.3 02/05/2018 1055      Component Value Date/Time   TSH 0.593 05/12/2018 1451   TSH 0.329 (L) 03/16/2018 1618   TSH 1.180 01/21/2018 1056     Ref. Range 01/21/2018 10:56  Kristina D, 25-Hydroxy Latest  Ref Range: 30.0 - 100.0 ng/mL 28.8 (L)   ASSESSMENT AND PLAN: Kristina D deficiency - Plan: Kristina D, Ergocalciferol, (DRISDOL) 50000 units CAPS capsule  Kristina - Plan: nystatin (MYCOSTATIN/NYSTOP) powder  Other Kristina - with emotional eating  - Plan: buPROPion (WELLBUTRIN SR) 150 MG 12 hr tablet  Other specified hypothyroidism - Plan: levothyroxine (SYNTHROID, LEVOTHROID) 125 MCG tablet  Class 3 severe Kristina with serious comorbidity and body mass index (BMI) of 40.0 to 44.9 in adult, unspecified Kristina type [facility])  PLAN: Kristina [name] was informed of the importance of good thyroid control to help with weight loss efforts. She was also informed that supertheraputic thyroid levels are dangerous and will not improve weight loss results. We will decrease levothyroxine to 125 mcg #30  with no refills. Agrees to follow up with our clinic as directed.    Kristina [name] was informed that low Kristina D levels contributes to fatigue and are associated with Kristina, breast, and colon cancer. She agrees to continue to take prescription Vit D @50 ,000 IU every week #4 with no refills and will follow up for routine testing of Kristina D, at least 2-3 times per year. She was informed of the risk of over-replacement of Kristina D and agrees to not increase her dose unless she discusses this with [place] first. Agrees to follow up with our clinic as directed.   Kristina  Discussed with [name] the effects of Kristina. We will refill Nystatin powder apply topically to affected area BID with no refills.   Kristina with Emotional Eating Behaviors We discussed behavior modification techniques today to help [name] deal with her emotional eating and Kristina. She has agreed to continue Wellbutrin SR 150 mg qd #30 with no refills and agreed to follow up as directed. We will refer to Dr. Mallie Mussel.   Kristina [name] is currently in the action stage of change. As such, her goal is to continue  with weight loss efforts She has agreed to keep a food journal with 1150-1300 calories and 85+ g  protein  Kristina [name] has been instructed to work up to a goal of 150 minutes of combined cardio and strengthening exercise per week for weight loss and overall health benefits. We discussed the following Behavioral Modification Strategies today: increasing lean protein intake, increasing vegetables, keeping a strict food journal, and work on meal planning and easy cooking plans   Kristina [name] has agreed to follow up with our clinic in 2 weeks. She was informed of the importance of frequent follow up visits to maximize her success with intensive lifestyle modifications for her multiple health conditions.   Kristina BEHAVIORAL INTERVENTION VISIT  Today's visit was # 9   Starting weight: 248 lb Starting date: 01/21/18 Today's weight : 228 lb Today's date: 06/15/2018 Total lbs lost to date: 20 lb  At least 15 minutes were spent on discussing the following behavioral intervention visit.   ASK: We discussed the diagnosis of Kristina with Kristina [name] today and Kristina [name] agreed to give [place] permission to discuss Kristina behavioral modification therapy today.  ASSESS: Kristina [name] has the diagnosis of Kristina and her BMI today is 40.4 Kristina [name] is in the action stage of change   ADVISE: Kristina [name] was educated on the multiple health risks of Kristina as well as the benefit of weight loss to improve her health. She was advised of the need for long term treatment and the importance of lifestyle modifications to improve her current health and to decrease her risk of future health problems.  AGREE: Multiple dietary modification options and treatment options were discussed and  Kristina [name] agreed to follow the recommendations documented in the above note.  ARRANGE: Kristina [name] was educated on the importance of frequent visits to treat Kristina as outlined per CMS and USPSTF guidelines and agreed to schedule her next follow up appointment  today.  I, Renee Ramus, am acting as transcriptionist for Ilene Qua, MD   I have reviewed the above documentation for accuracy and completeness, and I agree with the above. - Ilene Qua, MD

## 2018-06-21 ENCOUNTER — Other Ambulatory Visit (INDEPENDENT_AMBULATORY_CARE_PROVIDER_SITE_OTHER): Payer: Self-pay | Admitting: Family Medicine

## 2018-06-21 DIAGNOSIS — E8881 Metabolic syndrome: Secondary | ICD-10-CM

## 2018-06-22 ENCOUNTER — Other Ambulatory Visit (INDEPENDENT_AMBULATORY_CARE_PROVIDER_SITE_OTHER): Payer: Self-pay | Admitting: Family Medicine

## 2018-06-22 DIAGNOSIS — E8881 Metabolic syndrome: Secondary | ICD-10-CM

## 2018-06-22 DIAGNOSIS — E88819 Insulin resistance, unspecified: Secondary | ICD-10-CM

## 2018-06-22 DIAGNOSIS — F3289 Other specified depressive episodes: Secondary | ICD-10-CM

## 2018-06-29 ENCOUNTER — Ambulatory Visit (INDEPENDENT_AMBULATORY_CARE_PROVIDER_SITE_OTHER): Payer: Medicare Other | Admitting: Family Medicine

## 2018-06-29 VITALS — BP 116/80 | HR 72 | Temp 97.6°F | Ht 63.0 in | Wt 229.0 lb

## 2018-06-29 DIAGNOSIS — Z6841 Body Mass Index (BMI) 40.0 and over, adult: Secondary | ICD-10-CM

## 2018-06-29 DIAGNOSIS — E559 Vitamin D deficiency, unspecified: Secondary | ICD-10-CM

## 2018-06-29 DIAGNOSIS — F3289 Other specified depressive episodes: Secondary | ICD-10-CM

## 2018-06-29 DIAGNOSIS — E119 Type 2 diabetes mellitus without complications: Secondary | ICD-10-CM

## 2018-06-29 DIAGNOSIS — Z9189 Other specified personal risk factors, not elsewhere classified: Secondary | ICD-10-CM | POA: Diagnosis not present

## 2018-06-29 MED ORDER — METFORMIN HCL 500 MG PO TABS: 500.0000 mg | ORAL_TABLET | Freq: Every day | ORAL | 0 refills | Status: DC

## 2018-06-29 MED ORDER — NALTREXONE-BUPROPION HCL ER 8-90 MG PO TB12: ORAL_TABLET | ORAL | 0 refills | Status: DC

## 2018-06-30 NOTE — Progress Notes (Signed)
Office: [phone]  /  Fax: [phone]   HPI:   Chief Complaint: OBESITY Kristina [name] is here to discuss her progress with her obesity treatment plan. She is keeping a food journal of 1150 to 1300 calories and 85+ grams of protein and is following her eating plan approximately 60 % of the time. She states she is walking 15 minutes 2 times per week. Kristina [name] is currently struggling with cravings secondary to increased stress at home. She is eating emotionally lots of ice cream and sweet snacks. She is thinking of going back to the Category 2 plan.  Her weight is 229 lb (103.9 kg) today and had a weight gain of 1 lb in 2 weeks since her last visit. She has lost 19 lbs since starting treatment with [place].  Diabetes II Kristina [name] has a diagnosis of diabetes type II. Kristina [name] states that she is having carb cravings and she denies any GI side effects of metformin. Her last A1c was 5.4 on 02/05/18. She has been working on intensive lifestyle modifications including diet, exercise, and weight loss to help control her blood glucose levels.  Depression with emotional eating behaviors Kristina [name] is struggling with emotional eating and using food for comfort to the extent that it is negatively impacting her health. She has had increased stress at home and has increased her stress eating with carbohydrate rich snacks.  Vitamin D deficiency Kristina [name] has a diagnosis of vitamin D deficiency. She is currently taking vit D. She admits fatigue and denies nausea, vomiting or muscle weakness.  At risk for osteopenia and osteoporosis Kristina [name] is at higher risk of osteopenia and osteoporosis due to vitamin D deficiency.   ALLERGIES: Allergies  Allergen Reactions  . Ampicillin Anaphylaxis    Has patient had a PCN reaction causing immediate rash, facial/tongue/throat swelling, SOB or lightheadedness with hypotension: Yes Has patient had a PCN reaction causing severe rash involving mucus membranes or skin necrosis: Yes Has patient had a  PCN reaction that required hospitalization Yes Has patient had a PCN reaction occurring within the last 10 years: No If all of the above answers are "NO", then may proceed with Cephalosporin use.   . Codeine Hives and Shortness Of Breath  . Sulfonamide Derivatives Itching and Rash    MEDICATIONS: Current Outpatient Medications on File Prior to Visit  Medication Sig Dispense Refill  . dexlansoprazole (DEXILANT) 60 MG capsule Take 1 capsule (60 mg total) by mouth daily before breakfast. 90 capsule 3  . levothyroxine (SYNTHROID, LEVOTHROID) 125 MCG tablet Take 1 tablet (125 mcg total) by mouth daily. 30 tablet 0  . nystatin (MYCOSTATIN/NYSTOP) powder Apply topically 2 (two) times daily. 15 g 0  . nystatin-triamcinolone ointment (MYCOLOG) Apply 1 application topically 2 (two) times daily. 30 g 1  . Vitamin D, Ergocalciferol, (DRISDOL) 50000 units CAPS capsule Take 1 capsule (50,000 Units total) by mouth every 7 (seven) days. 4 capsule 0   Current Facility-Administered Medications on File Prior to Visit  Medication Dose Route Frequency Provider Last Rate Last Dose  . 0.9 %  sodium chloride infusion  500 mL Intravenous Once [name], MD        PAST MEDICAL HISTORY: Past Medical History:  Diagnosis Date  . Allergic rhinitis   . Anxiety   . Asthma   . Bulging disc   . Cancer (HCC)    cervical cancer   . Chronic back pain   . Chronic cough   . Depression   . Diabetes mellitus  diet control only  . Diverticulosis   . Dyspnea    chronic  . Family history of adverse reaction to anesthesia    pts daughter had reaction to profolol - difficulty with breathing   . GERD (gastroesophageal reflux disease)   . High cholesterol   . History of bronchitis   . Hx of adenomatous and sessile serrated colonic polyps   . Hypertension   . Hypothyroidism   . Lumbosacral spondylosis without myelopathy    history bulging disc-chronic pain(level 3 to 8).  . Normal cardiac stress test 02/2015     with normal EF  . Numbness    feet bilat comes and goes   . Obesity   . Obstructive sleep apnea    uses c-pap at night  . Palpitations   . Pneumonia    last episode 2012  . PONV (postoperative nausea and vomiting)   . Prediabetes   . Restless leg   . Vitamin D deficiency   . Vocal cord dysfunction     PAST SURGICAL HISTORY: Past Surgical History:  Procedure Laterality Date  . ABDOMINAL HYSTERECTOMY  1996  . CHOLECYSTECTOMY  1994  . COLONOSCOPY WITH PROPOFOL N/A 03/10/2015   Procedure: COLONOSCOPY WITH PROPOFOL;  Surgeon: [name], MD;  Location: [facility];  Service: Endoscopy;  Laterality: N/A;  . ESOPHAGOGASTRODUODENOSCOPY N/A 10/27/2013   Procedure: ESOPHAGOGASTRODUODENOSCOPY (EGD);  Surgeon: [name], MD;  Location: [facility];  Service: Endoscopy;  Laterality: N/A;  . KNEE ARTHROSCOPY Right   . LAPAROSCOPIC GASTRIC SLEEVE RESECTION WITH HIATAL HERNIA REPAIR N/A 09/19/2015   Procedure: LAPAROSCOPIC GASTRIC SLEEVE RESECTION WITH HIATAL HERNIA REPAIR;  Surgeon: [name], MD;  Location: [facility];  Service: General;  Laterality: N/A;  . NOSE SURGERY  2004  . ovaries removed    . TUBAL LIGATION    . UPPER GI ENDOSCOPY  09/19/2015   Procedure: UPPER GI ENDOSCOPY;  Surgeon: [name], MD;  Location: [facility];  Service: General;;    SOCIAL HISTORY: Social History   Tobacco Use  . Smoking status: Never Smoker  . Smokeless tobacco: Never Used  Substance Use Topics  . Alcohol use: No  . Drug use: No    FAMILY HISTORY: Family History  Problem Relation Age of Onset  . Emphysema Mother   . Allergies Mother   . Heart disease Mother   . Asthma Mother   . Ovarian cancer Mother   . Hypertension Mother   . Diabetes Mother   . High Cholesterol Mother   . Stroke Mother   . Thyroid disease Mother   . Depression Mother   . Anxiety disorder Mother   . Heart disease Father        deceased at age 17 from heart attack  . Sudden death Father   . High Cholesterol  Father   . High blood pressure Father   . Stroke Maternal Grandmother   . Heart disease Maternal Grandmother   . Cancer Maternal Grandfather   . Heart disease Paternal Grandmother   . Multiple sclerosis Paternal Grandmother   . Heart attack Paternal Grandfather   . Thyroid disease Brother        died at 103  . Heart attack Brother        died at 27  . COPD Brother   . Colon cancer Brother 28  . Hyperthyroidism Sister   . Hypertension Brother   . Hypertension Daughter   . Allergies Child   . Asthma Son   .  Asthma Brother   . Asthma Brother   . Lung cancer Maternal Aunt   . COPD Sister        died at 49  . Sudden death Sister   . Asthma Son     ROS: Review of Systems  Constitutional: Positive for malaise/fatigue. Negative for weight loss.  Gastrointestinal: Negative for diarrhea, nausea and vomiting.  Musculoskeletal:       Negative for muscle weakness.  Endo/Heme/Allergies:       Positive for carb cravings.    PHYSICAL EXAM: Blood pressure 116/80, pulse 72, temperature 97.6 F (36.4 C), temperature source Oral, height 5\' 3"  (1.6 m), weight 229 lb (103.9 kg), SpO2 96 %. Body mass index is 40.57 kg/m. Physical Exam  Constitutional: She is oriented to person, place, and time. She appears well-developed and well-nourished.  Cardiovascular: Normal rate.  Musculoskeletal: Normal range of motion.  Neurological: She is oriented to person, place, and time.  Skin: Skin is warm and dry.  Vitals reviewed.   RECENT LABS AND TESTS: BMET    Component Value Date/Time   NA 140 02/05/2018 1055   K 4.5 02/05/2018 1055   CL 105 02/05/2018 1055   CO2 22 02/05/2018 1055   GLUCOSE 91 02/05/2018 1055   GLUCOSE 84 11/10/2015 0527   BUN 10 02/05/2018 1055   CREATININE 0.62 02/05/2018 1055   CREATININE 0.88 02/02/2015 1458   CREATININE 0.88 02/02/2015 1458   CALCIUM 9.3 02/05/2018 1055   GFRNONAA 108 02/05/2018 1055   GFRAA 125 02/05/2018 1055   Lab Results  Component Value  Date   HGBA1C 5.4 02/05/2018   HGBA1C 6.3 (H) 09/14/2015   HGBA1C (H) 02/12/2011    6.2 (NOTE)                                                                       According to the ADA Clinical Practice Recommendations for 2011, when HbA1c is used as a screening test:   >=6.5%   Diagnostic of Diabetes Mellitus           (if abnormal result  is confirmed)  5.7-6.4%   Increased risk of developing Diabetes Mellitus  References:Diagnosis and Classification of Diabetes Mellitus,Diabetes VQQV,9563,87(FIEPP 1):S62-S69 and Standards of Medical Care in         Diabetes - 2011,Diabetes Care,2011,34  (Suppl 1):S11-S61.   HGBA1C 6.2 (H) 02/02/2008   Lab Results  Component Value Date   INSULIN 11.0 01/21/2018   CBC    Component Value Date/Time   WBC 5.8 02/05/2018 1055   WBC 3.8 (L) 11/10/2015 0527   RBC 4.43 02/05/2018 1055   RBC 4.46 11/10/2015 0527   HGB 12.4 02/05/2018 1055   HCT 37.3 02/05/2018 1055   PLT 121 (L) 11/10/2015 0527   MCV 84 02/05/2018 1055   MCH 28.0 02/05/2018 1055   MCH 27.4 11/10/2015 0527   MCHC 33.2 02/05/2018 1055   MCHC 31.3 11/10/2015 0527   RDW 13.9 02/05/2018 1055   LYMPHSABS 2.6 02/05/2018 1055   MONOABS 0.8 11/09/2015 0515   EOSABS 0.1 02/05/2018 1055   BASOSABS 0.0 02/05/2018 1055   Iron/TIBC/Ferritin/ %Sat No results found for: IRON, TIBC, FERRITIN, IRONPCTSAT Lipid Panel     Component Value Date/Time  CHOL 233 (H) 02/05/2018 1055   TRIG 157 (H) 02/05/2018 1055   HDL 51 02/05/2018 1055   CHOLHDL 4.5 02/12/2008 0540   VLDL 25 02/12/2008 0540   LDLCALC 151 (H) 02/05/2018 1055   Hepatic Function Panel     Component Value Date/Time   PROT 6.8 02/05/2018 1055   ALBUMIN 4.2 02/05/2018 1055   AST 16 02/05/2018 1055   ALT 11 02/05/2018 1055   ALKPHOS 110 02/05/2018 1055   BILITOT 0.3 02/05/2018 1055      Component Value Date/Time   TSH 0.593 05/12/2018 1451   TSH 0.329 (L) 03/16/2018 1618   TSH 1.180 01/21/2018 1056   Results for JOSSLYN, CIOLEK (MRN [id number]) as of 06/30/2018 14:55  Ref. Range 01/21/2018 10:56  Vitamin D, 25-Hydroxy Latest Ref Range: 30.0 - 100.0 ng/mL 28.8 (L)   ASSESSMENT AND PLAN: Type 2 diabetes mellitus without complication, without long-term current use of insulin (HCC) - Plan: metFORMIN (GLUCOPHAGE) 500 MG tablet  Vitamin D deficiency  Other depression - with emotional eating - Plan: Naltrexone-buPROPion HCl ER (CONTRAVE) 8-90 MG TB12  At risk for osteoporosis  Class 3 severe obesity with serious comorbidity and body mass index (BMI) of 40.0 to 44.9 in adult, unspecified obesity type ([place])  PLAN:  Diabetes II Shamika has been given extensive diabetes education by myself today including ideal fasting and post-prandial blood glucose readings, individual ideal Hgb A1c goals, and hypoglycemia prevention. We discussed the importance of good blood sugar control to decrease the likelihood of diabetic complications such as nephropathy, neuropathy, limb loss, blindness, coronary artery disease, and death. We discussed the importance of intensive lifestyle modification including diet, exercise and weight loss as the first line treatment for diabetes. Briley agrees to continue her metformin 500mg  PO qAM #30 with no refills and will follow up at the agreed upon time.  Depression with Emotional Eating Behaviors We discussed behavior modification techniques today to help Jezlyn deal with her emotional eating and depression. She has agreed to start Contrave 8mg /90mg  1 tab PO qAM for 1 week and then increase to 1 tab PO BID for 1 week. She agrees to stop bupropion and to follow up as directed in 2 weeks.  Vitamin D Deficiency Neasia was informed that low vitamin D levels contributes to fatigue and are associated with obesity, breast, and colon cancer. She agrees to continue to take prescription Vit D @50 ,000 IU every week and will follow up for routine testing of vitamin D, at least 2-3 times per year. She was informed of  the risk of over-replacement of vitamin D and agrees to not increase her dose unless she discusses this with [place] first. She agrees to follow up in 3 weeks.  At risk for osteopenia and osteoporosis Ethelean was given extended (15 minutes) osteoporosis prevention counseling today. Wen is at risk for osteopenia and osteoporosis due to her vitamin D deficiency. She was encouraged to take her vitamin D and follow her higher calcium diet and increase strengthening exercise to help strengthen her bones and decrease her risk of osteopenia and osteoporosis.  Obesity Juliane is currently in the action stage of change. As such, her goal is to continue with weight loss efforts. She has agreed to follow the Category 2 plan. Talya has been instructed to work up to a goal of 150 minutes of combined cardio and strengthening exercise per week for weight loss and overall health benefits. We discussed the following Behavioral Modification Strategies today: increasing lean protein intake,  increasing vegetables, work on meal planning and easy cooking plans, better snacking choices, and planning for success.  Billy has agreed to follow up with our clinic in 2 weeks. She was informed of the importance of frequent follow up visits to maximize her success with intensive lifestyle modifications for her multiple health conditions.   OBESITY BEHAVIORAL INTERVENTION VISIT  Today's visit was # 10   Starting weight: 248 lbs Starting date: 01/21/18 Today's weight : Weight: 229 lb (103.9 kg)  Today's date: 06/29/2018 Total lbs lost to date: 24  ASK: We discussed the diagnosis of obesity with Princess Perna today and Shalise agreed to give [place] permission to discuss obesity behavioral modification therapy today.  ASSESS: Jacqulene has the diagnosis of obesity and her BMI today is 40.58. Bennye is in the action stage of change.   ADVISE: Ila was educated on the multiple health risks of obesity as well as the benefit of weight  loss to improve her health. She was advised of the need for long term treatment and the importance of lifestyle modifications to improve her current health and to decrease her risk of future health problems.  AGREE: Multiple dietary modification options and treatment options were discussed and Maralyn agreed to follow the recommendations documented in the above note.  ARRANGE: Keyunna was educated on the importance of frequent visits to treat obesity as outlined per CMS and USPSTF guidelines and agreed to schedule her next follow up appointment today.  I, Marcille Blanco, am acting as Location manager for Eber Jones, MD  I have reviewed the above documentation for accuracy and completeness, and I agree with the above. - Ilene Qua, MD

## 2018-07-02 DIAGNOSIS — K219 Gastro-esophageal reflux disease without esophagitis: Secondary | ICD-10-CM | POA: Diagnosis not present

## 2018-07-02 DIAGNOSIS — E1169 Type 2 diabetes mellitus with other specified complication: Secondary | ICD-10-CM | POA: Diagnosis not present

## 2018-07-02 DIAGNOSIS — Z9884 Bariatric surgery status: Secondary | ICD-10-CM | POA: Diagnosis not present

## 2018-07-02 DIAGNOSIS — I10 Essential (primary) hypertension: Secondary | ICD-10-CM | POA: Diagnosis not present

## 2018-07-05 ENCOUNTER — Other Ambulatory Visit (INDEPENDENT_AMBULATORY_CARE_PROVIDER_SITE_OTHER): Payer: Self-pay | Admitting: Family Medicine

## 2018-07-05 DIAGNOSIS — E559 Vitamin D deficiency, unspecified: Secondary | ICD-10-CM

## 2018-07-16 ENCOUNTER — Encounter (INDEPENDENT_AMBULATORY_CARE_PROVIDER_SITE_OTHER): Payer: Self-pay | Admitting: Family Medicine

## 2018-07-16 ENCOUNTER — Ambulatory Visit (INDEPENDENT_AMBULATORY_CARE_PROVIDER_SITE_OTHER): Payer: Medicare Other | Admitting: Family Medicine

## 2018-07-16 ENCOUNTER — Encounter (INDEPENDENT_AMBULATORY_CARE_PROVIDER_SITE_OTHER): Payer: Self-pay

## 2018-07-16 VITALS — BP 140/84 | HR 82

## 2018-07-16 DIAGNOSIS — E119 Type 2 diabetes mellitus without complications: Secondary | ICD-10-CM | POA: Diagnosis not present

## 2018-07-16 DIAGNOSIS — F3289 Other specified depressive episodes: Secondary | ICD-10-CM | POA: Diagnosis not present

## 2018-07-16 DIAGNOSIS — Z9189 Other specified personal risk factors, not elsewhere classified: Secondary | ICD-10-CM | POA: Diagnosis not present

## 2018-07-16 DIAGNOSIS — Z6841 Body Mass Index (BMI) 40.0 and over, adult: Secondary | ICD-10-CM | POA: Diagnosis not present

## 2018-07-16 DIAGNOSIS — E559 Vitamin D deficiency, unspecified: Secondary | ICD-10-CM

## 2018-07-16 DIAGNOSIS — E038 Other specified hypothyroidism: Secondary | ICD-10-CM

## 2018-07-16 MED ORDER — NALTREXONE-BUPROPION HCL ER 8-90 MG PO TB12: ORAL_TABLET | ORAL | 0 refills | Status: DC

## 2018-07-16 MED ORDER — VITAMIN D (ERGOCALCIFEROL) 1.25 MG (50000 UNIT) PO CAPS: 50000.0000 [IU] | ORAL_CAPSULE | ORAL | 0 refills | Status: DC

## 2018-07-16 NOTE — Progress Notes (Signed)
Office: [phone]  /  Fax: [phone]   HPI:   Chief Complaint: OBESITY Kristina [name] is here to discuss her progress with her obesity treatment plan. She is on the Category 2 plan and is following her eating plan approximately 80 % of the time. She states she is walking for 15 minutes 3 times per week. Kristina [name] is starting to get bored of lunch options, especially the microwave meals as only so many choices. She notes some hunger right before bed.  Her weight is 226 lb (102.5 kg) today and has had a weight loss of 3 pounds over a period of 2 to 3 weeks since her last visit. She has lost 22 lbs since starting treatment with [place].  Vitamin D Deficiency Kristina [name] has a diagnosis of vitamin D deficiency. She is currently taking prescription Vit D. She notes fatigue and denies nausea, vomiting or muscle weakness.  At risk for osteopenia and osteoporosis Kristina [name] is at higher risk of osteopenia and osteoporosis due to vitamin D deficiency.   Hypothyroidism Kristina [name] has a diagnosis of hypothyroidism. She is on levothyroxine. She denies hot or cold intolerance or palpitations, but does admit to ongoing fatigue.  Diabetes II Kristina [name] has a diagnosis of diabetes type II. Kristina [name] denies cravings or GI side effects of metformin. Last A1c was 5.4. She denies hypoglycemia. She has been working on intensive lifestyle modifications including diet, exercise, and weight loss to help control her blood glucose levels.  Depression with emotional eating behaviors Kristina [name] denies cravings, and she struggles with emotional eating and using food for comfort to the extent that it is negatively impacting her health. She often snacks when she is not hungry. Kristina [name] sometimes feels she is out of control and then feels guilty that she made poor food choices. She has been working on behavior modification techniques to help reduce her emotional eating and has been somewhat successful. She shows no sign of suicidal or homicidal  ideations.  Depression screen Kristina [name] 2/9 01/21/2018 04/19/2016 01/24/2016 11/20/2015 10/03/2015  Decreased Interest 3 0 0 0 0  Down, Depressed, Hopeless 3 0 0 0 0  PHQ - 2 Score 6 0 0 0 0  Altered sleeping 2 - - - -  Tired, decreased energy 3 - - - -  Change in appetite 3 - - - -  Feeling bad or failure about yourself  3 - - - -  Trouble concentrating 1 - - - -  Moving slowly or fidgety/restless 1 - - - -  Suicidal thoughts 0 - - - -  PHQ-9 Score 19 - - - -  Difficult doing work/chores Very difficult - - - -    ALLERGIES: Allergies  Allergen Reactions  . Ampicillin Anaphylaxis    Has patient had a PCN reaction causing immediate rash, facial/tongue/throat swelling, SOB or lightheadedness with hypotension: Yes Has patient had a PCN reaction causing severe rash involving mucus membranes or skin necrosis: Yes Has patient had a PCN reaction that required hospitalization Yes Has patient had a PCN reaction occurring within the last 10 years: No If all of the above answers are "NO", then may proceed with Cephalosporin use.   . Codeine Hives and Shortness Of Breath  . Sulfonamide Derivatives Itching and Rash    MEDICATIONS: Current Outpatient Medications on File Prior to Visit  Medication Sig Dispense Refill  . dexlansoprazole (DEXILANT) 60 MG capsule Take 1 capsule (60 mg total) by mouth daily before breakfast. 90 capsule 3  . levothyroxine (SYNTHROID, LEVOTHROID) 125 MCG  tablet Take 1 tablet (125 mcg total) by mouth daily. 30 tablet 0  . metFORMIN (GLUCOPHAGE) 500 MG tablet Take 1 tablet (500 mg total) by mouth daily with breakfast. 30 tablet 0  . nystatin (MYCOSTATIN/NYSTOP) powder Apply topically 2 (two) times daily. 15 g 0  . nystatin-triamcinolone ointment (MYCOLOG) Apply 1 application topically 2 (two) times daily. 30 g 1   Current Facility-Administered Medications on File Prior to Visit  Medication Dose Route Frequency Provider Last Rate Last Dose  . 0.9 %  sodium chloride infusion  500  mL Intravenous Once [name], MD        PAST MEDICAL HISTORY: Past Medical History:  Diagnosis Date  . Allergic rhinitis   . Anxiety   . Asthma   . Bulging disc   . Cancer (HCC)    cervical cancer   . Chronic back pain   . Chronic cough   . Depression   . Diabetes mellitus    diet control only  . Diverticulosis   . Dyspnea    chronic  . Family history of adverse reaction to anesthesia    pts daughter had reaction to profolol - difficulty with breathing   . GERD (gastroesophageal reflux disease)   . High cholesterol   . History of bronchitis   . Hx of adenomatous and sessile serrated colonic polyps   . Hypertension   . Hypothyroidism   . Lumbosacral spondylosis without myelopathy    history bulging disc-chronic pain(level 3 to 8).  . Normal cardiac stress test 02/2015   with normal EF  . Numbness    feet bilat comes and goes   . Obesity   . Obstructive sleep apnea    uses c-pap at night  . Palpitations   . Pneumonia    last episode 2012  . PONV (postoperative nausea and vomiting)   . Prediabetes   . Restless leg   . Vitamin D deficiency   . Vocal cord dysfunction     PAST SURGICAL HISTORY: Past Surgical History:  Procedure Laterality Date  . ABDOMINAL HYSTERECTOMY  1996  . CHOLECYSTECTOMY  1994  . COLONOSCOPY WITH PROPOFOL N/A 03/10/2015   Procedure: COLONOSCOPY WITH PROPOFOL;  Surgeon: [name], MD;  Location: [facility];  Service: Endoscopy;  Laterality: N/A;  . ESOPHAGOGASTRODUODENOSCOPY N/A 10/27/2013   Procedure: ESOPHAGOGASTRODUODENOSCOPY (EGD);  Surgeon: [name], MD;  Location: [facility];  Service: Endoscopy;  Laterality: N/A;  . KNEE ARTHROSCOPY Right   . LAPAROSCOPIC GASTRIC SLEEVE RESECTION WITH HIATAL HERNIA REPAIR N/A 09/19/2015   Procedure: LAPAROSCOPIC GASTRIC SLEEVE RESECTION WITH HIATAL HERNIA REPAIR;  Surgeon: [name], MD;  Location: [facility];  Service: General;  Laterality: N/A;  . NOSE SURGERY  2004  . ovaries removed     . TUBAL LIGATION    . UPPER GI ENDOSCOPY  09/19/2015   Procedure: UPPER GI ENDOSCOPY;  Surgeon: [name], MD;  Location: [facility];  Service: General;;    SOCIAL HISTORY: Social History   Tobacco Use  . Smoking status: Never Smoker  . Smokeless tobacco: Never Used  Substance Use Topics  . Alcohol use: No  . Drug use: No    FAMILY HISTORY: Family History  Problem Relation Age of Onset  . Emphysema Mother   . Allergies Mother   . Heart disease Mother   . Asthma Mother   . Ovarian cancer Mother   . Hypertension Mother   . Diabetes Mother   . High Cholesterol Mother   .  Stroke Mother   . Thyroid disease Mother   . Depression Mother   . Anxiety disorder Mother   . Heart disease Father        deceased at age 63 from heart attack  . Sudden death Father   . High Cholesterol Father   . High blood pressure Father   . Stroke Maternal Grandmother   . Heart disease Maternal Grandmother   . Cancer Maternal Grandfather   . Heart disease Paternal Grandmother   . Multiple sclerosis Paternal Grandmother   . Heart attack Paternal Grandfather   . Thyroid disease Brother        died at 71  . Heart attack Brother        died at 4  . COPD Brother   . Colon cancer Brother 26  . Hyperthyroidism Sister   . Hypertension Brother   . Hypertension Daughter   . Allergies Child   . Asthma Son   . Asthma Brother   . Asthma Brother   . Lung cancer Maternal Aunt   . COPD Sister        died at 67  . Sudden death Sister   . Asthma Son     ROS: Review of Systems  Constitutional: Positive for malaise/fatigue and weight loss.  Cardiovascular: Negative for palpitations.  Gastrointestinal: Negative for nausea and vomiting.  Musculoskeletal:       Negative muscle weakness  Endo/Heme/Allergies:       Negative hot/cold intolerance Negative hypoglycemia  Psychiatric/Behavioral: Positive for depression. Negative for suicidal ideas.    PHYSICAL EXAM: Blood pressure 140/84, pulse 82,  SpO2 96 %. There is no height or weight on file to calculate BMI. Physical Exam  Constitutional: She is oriented to person, place, and time. She appears well-developed and well-nourished.  Cardiovascular: Normal rate.  Pulmonary/Chest: Effort normal.  Musculoskeletal: Normal range of motion.  Neurological: She is oriented to person, place, and time.  Skin: Skin is warm and dry.  Psychiatric: She has a normal mood and affect. Her behavior is normal.  Vitals reviewed.   RECENT LABS AND TESTS: BMET    Component Value Date/Time   NA 140 02/05/2018 1055   K 4.5 02/05/2018 1055   CL 105 02/05/2018 1055   CO2 22 02/05/2018 1055   GLUCOSE 91 02/05/2018 1055   GLUCOSE 84 11/10/2015 0527   BUN 10 02/05/2018 1055   CREATININE 0.62 02/05/2018 1055   CREATININE 0.88 02/02/2015 1458   CREATININE 0.88 02/02/2015 1458   CALCIUM 9.3 02/05/2018 1055   GFRNONAA 108 02/05/2018 1055   GFRAA 125 02/05/2018 1055   Lab Results  Component Value Date   HGBA1C 5.4 02/05/2018   HGBA1C 6.3 (H) 09/14/2015   HGBA1C (H) 02/12/2011    6.2 (NOTE)                                                                       According to the ADA Clinical Practice Recommendations for 2011, when HbA1c is used as a screening test:   >=6.5%   Diagnostic of Diabetes Mellitus           (if abnormal result  is confirmed)  5.7-6.4%   Increased risk of developing Diabetes Mellitus  References:Diagnosis and  Classification of Diabetes Mellitus,Diabetes [id number],5009,38(HWEXH 1):S62-S69 and Standards of Medical Care in         Diabetes - 2011,Diabetes [id number],9678,93  (Suppl 1):S11-S61.   HGBA1C 6.2 (H) 02/02/2008   Lab Results  Component Value Date   INSULIN 11.0 01/21/2018   CBC    Component Value Date/Time   WBC 5.8 02/05/2018 1055   WBC 3.8 (L) 11/10/2015 0527   RBC 4.43 02/05/2018 1055   RBC 4.46 11/10/2015 0527   HGB 12.4 02/05/2018 1055   HCT 37.3 02/05/2018 1055   PLT 121 (L) 11/10/2015 0527   MCV 84 02/05/2018  1055   MCH 28.0 02/05/2018 1055   MCH 27.4 11/10/2015 0527   MCHC 33.2 02/05/2018 1055   MCHC 31.3 11/10/2015 0527   RDW 13.9 02/05/2018 1055   LYMPHSABS 2.6 02/05/2018 1055   MONOABS 0.8 11/09/2015 0515   EOSABS 0.1 02/05/2018 1055   BASOSABS 0.0 02/05/2018 1055   Iron/TIBC/Ferritin/ %Sat No results found for: IRON, TIBC, FERRITIN, IRONPCTSAT Lipid Panel     Component Value Date/Time   CHOL 233 (H) 02/05/2018 1055   TRIG 157 (H) 02/05/2018 1055   HDL 51 02/05/2018 1055   CHOLHDL 4.5 02/12/2008 0540   VLDL 25 02/12/2008 0540   LDLCALC 151 (H) 02/05/2018 1055   Hepatic Function Panel     Component Value Date/Time   PROT 6.8 02/05/2018 1055   ALBUMIN 4.2 02/05/2018 1055   AST 16 02/05/2018 1055   ALT 11 02/05/2018 1055   ALKPHOS 110 02/05/2018 1055   BILITOT 0.3 02/05/2018 1055      Component Value Date/Time   TSH 0.593 05/12/2018 1451   TSH 0.329 (L) 03/16/2018 1618   TSH 1.180 01/21/2018 1056  Results for RASHAUN, CURL (MRN [id number]) as of 07/16/2018 16:03  Ref. Range 01/21/2018 10:56  Vitamin D, 25-Hydroxy Latest Ref Range: 30.0 - 100.0 ng/mL 28.8 (L)    ASSESSMENT AND PLAN: Vitamin D deficiency - Plan: VITAMIN D 25 Hydroxy (Vit-D Deficiency, Fractures), Vitamin D, Ergocalciferol, (DRISDOL) 50000 units CAPS capsule  Other specified hypothyroidism - Plan: TSH  Type 2 diabetes mellitus without complication, without long-term current use of insulin (HCC) - Plan: Hemoglobin A1c, Insulin, random  Other depression - with emotional eating - Plan: Naltrexone-buPROPion HCl ER (CONTRAVE) 8-90 MG TB12  At risk for osteoporosis  Class 3 severe obesity with serious comorbidity and body mass index (BMI) of 40.0 to 44.9 in adult, unspecified obesity type (HCC)  PLAN:  Vitamin D Deficiency Kristina [name] was informed that low vitamin D levels contributes to fatigue and are associated with obesity, breast, and colon cancer. Kristina [name] agrees to continue taking prescription Vit D  @50 ,000 IU every week #4 and we will refill for 1 month. She will follow up for routine testing of vitamin D, at least 2-3 times per year. She was informed of the risk of over-replacement of vitamin D and agrees to not increase her dose unless she discusses this with [place] first. We will check labs today and Anahi agrees to follow up with our clinic in 2 weeks.  At risk for osteopenia and osteoporosis Kristina [name] was given extended (15 minutes) osteoporosis prevention counseling today. Kristina [name] is at risk for osteopenia and osteoporsis due to her vitamin D deficiency. She was encouraged to take her vitamin D and follow her higher calcium diet and increase strengthening exercise to help strengthen her bones and decrease her risk of osteopenia and osteoporosis.  Hypothyroidism Kristina [name] was informed of the importance of good thyroid  control to help with weight loss efforts. She was also informed that supertheraputic thyroid levels are dangerous and will not improve weight loss results. We will check labs today and Kristina [name] agrees to follow up with our clinic in 2 weeks.  Diabetes II Kristina [name] has been given extensive diabetes education by myself today including ideal fasting and post-prandial blood glucose readings, individual ideal Hgb A1c goals and hypoglycemia prevention. We discussed the importance of good blood sugar control to decrease the likelihood of diabetic complications such as nephropathy, neuropathy, limb loss, blindness, coronary artery disease, and death. We discussed the importance of intensive lifestyle modification including diet, exercise and weight loss as the first line treatment for diabetes. Kristina [name] agrees to continue her diabetes medications and we will check labs today. Kristina [name] agrees to follow up with our clinic in 2 weeks.  Depression with Emotional Eating Behaviors We discussed behavior modification techniques today to help Kristina [name] deal with her emotional eating and depression. Kristina [name] agrees to increase  Contrave to 2 tablets in the morning and 1 tablet in the evening for 1 week, then Contrave 8-90 mg 2 tablets BID #60 with no refills. Kristina [name] agrees to follow up with our clinic in 2 weeks.  Obesity Kristina [name] is currently in the action stage of change. As such, her goal is to continue with weight loss efforts She has agreed to follow the Category 2 plan + 100 calories Kristina [name] has been instructed to work up to a goal of 150 minutes of combined cardio and strengthening exercise per week for weight loss and overall health benefits. We discussed the following Behavioral Modification Strategies today: increasing lean protein intake, increasing vegetables, work on meal planning and easy cooking plans, and planning for success We discussed various medication options to help Kristina [name] with her weight loss efforts and we both agreed to increase Contrave to 2 tablets PO q AM and 1 tablet PO q PM.   Rily has agreed to follow up with our clinic in 2 weeks. She was informed of the importance of frequent follow up visits to maximize her success with intensive lifestyle modifications for her multiple health conditions.   OBESITY BEHAVIORAL INTERVENTION VISIT  Today's visit was # 11   Starting weight: 248 lbs Starting date: 01/21/18 Today's weight : 226 lbs  Today's date: 07/16/2018 Total lbs lost to date: 22 At least 15 minutes were spent on discussing the following behavioral intervention visit.   ASK: We discussed the diagnosis of obesity with Princess Perna today and Tequia agreed to give [place] permission to discuss obesity behavioral modification therapy today.  ASSESS: Hanako has the diagnosis of obesity and her BMI today is 40.04 Rebeca is in the action stage of change   ADVISE: Yessika was educated on the multiple health risks of obesity as well as the benefit of weight loss to improve her health. She was advised of the need for long term treatment and the importance of lifestyle modifications to improve her  current health and to decrease her risk of future health problems.  AGREE: Multiple dietary modification options and treatment options were discussed and  Tarissa agreed to follow the recommendations documented in the above note.  ARRANGE: Davonda was educated on the importance of frequent visits to treat obesity as outlined per CMS and USPSTF guidelines and agreed to schedule her next follow up appointment today.  I, Trixie Dredge, am acting as transcriptionist for Ilene Qua, MD  I have reviewed the above documentation for accuracy and completeness, and  I agree with the above. - Ilene Qua, MD

## 2018-07-17 LAB — HEMOGLOBIN A1C
ESTIMATED AVERAGE GLUCOSE: 111 mg/dL
HEMOGLOBIN A1C: 5.5 % (ref 4.8–5.6)

## 2018-07-17 LAB — INSULIN, RANDOM: INSULIN: 6.9 u[IU]/mL (ref 2.6–24.9)

## 2018-07-17 LAB — TSH: TSH: 0.481 u[IU]/mL (ref 0.450–4.500)

## 2018-07-17 LAB — VITAMIN D 25 HYDROXY (VIT D DEFICIENCY, FRACTURES): VIT D 25 HYDROXY: 42.2 ng/mL (ref 30.0–100.0)

## 2018-07-20 ENCOUNTER — Other Ambulatory Visit (INDEPENDENT_AMBULATORY_CARE_PROVIDER_SITE_OTHER): Payer: Self-pay | Admitting: Family Medicine

## 2018-07-20 DIAGNOSIS — E038 Other specified hypothyroidism: Secondary | ICD-10-CM

## 2018-07-20 MED ORDER — LEVOTHYROXINE SODIUM 125 MCG PO TABS: 125.0000 ug | ORAL_TABLET | Freq: Every day | ORAL | 0 refills | Status: DC

## 2018-07-24 ENCOUNTER — Other Ambulatory Visit: Payer: Self-pay

## 2018-07-24 NOTE — Patient Outreach (Signed)
[facility]) Care Management  07/24/2018  DOSHIA DALIA 09/26/1970 [id number]   Medication Adherence call to Mrs. Iliani Vejar left a message for patient to call back patient is due on Metformin 500 mg.Mrs. Seres is showing past due under [place].   [facility] Management Direct Dial [phone]  Fax [phone] [email or web address]

## 2018-08-05 ENCOUNTER — Ambulatory Visit (INDEPENDENT_AMBULATORY_CARE_PROVIDER_SITE_OTHER): Payer: Self-pay | Admitting: Family Medicine

## 2018-08-05 ENCOUNTER — Ambulatory Visit (INDEPENDENT_AMBULATORY_CARE_PROVIDER_SITE_OTHER): Payer: Medicare Other | Admitting: Family Medicine

## 2018-08-05 VITALS — BP 117/83 | HR 78 | Temp 97.6°F | Ht 63.0 in | Wt 227.0 lb

## 2018-08-05 DIAGNOSIS — E119 Type 2 diabetes mellitus without complications: Secondary | ICD-10-CM

## 2018-08-05 DIAGNOSIS — Z6841 Body Mass Index (BMI) 40.0 and over, adult: Secondary | ICD-10-CM | POA: Diagnosis not present

## 2018-08-05 DIAGNOSIS — F3289 Other specified depressive episodes: Secondary | ICD-10-CM

## 2018-08-05 DIAGNOSIS — E038 Other specified hypothyroidism: Secondary | ICD-10-CM

## 2018-08-05 DIAGNOSIS — E559 Vitamin D deficiency, unspecified: Secondary | ICD-10-CM

## 2018-08-05 MED ORDER — VITAMIN D (ERGOCALCIFEROL) 1.25 MG (50000 UNIT) PO CAPS: 50000.0000 [IU] | ORAL_CAPSULE | ORAL | 0 refills | Status: DC

## 2018-08-05 MED ORDER — METFORMIN HCL 500 MG PO TABS: 500.0000 mg | ORAL_TABLET | Freq: Every day | ORAL | 0 refills | Status: DC

## 2018-08-05 MED ORDER — LEVOTHYROXINE SODIUM 125 MCG PO TABS: 125.0000 ug | ORAL_TABLET | Freq: Every day | ORAL | 0 refills | Status: DC

## 2018-08-05 MED ORDER — BUPROPION HCL ER (SR) 150 MG PO TB12: 150.0000 mg | ORAL_TABLET | Freq: Every day | ORAL | 0 refills | Status: DC

## 2018-08-07 ENCOUNTER — Ambulatory Visit (INDEPENDENT_AMBULATORY_CARE_PROVIDER_SITE_OTHER): Payer: Medicare Other | Admitting: Plastic Surgery

## 2018-08-07 VITALS — BP 120/80 | Resp 14 | Ht 63.0 in | Wt 227.0 lb

## 2018-08-07 DIAGNOSIS — M793 Panniculitis, unspecified: Secondary | ICD-10-CM

## 2018-08-07 DIAGNOSIS — M542 Cervicalgia: Secondary | ICD-10-CM | POA: Diagnosis not present

## 2018-08-07 DIAGNOSIS — G8929 Other chronic pain: Secondary | ICD-10-CM | POA: Diagnosis not present

## 2018-08-07 DIAGNOSIS — E881 Lipodystrophy, not elsewhere classified: Secondary | ICD-10-CM

## 2018-08-07 DIAGNOSIS — M546 Pain in thoracic spine: Secondary | ICD-10-CM

## 2018-08-07 NOTE — Progress Notes (Signed)
Patient ID: Kristina [name], female    DOB: [birth year], 47 y.o.   MRN: [id number]   Chief Complaint  Patient presents with  . Skin Problem    The patient is a 47 year old white female here for consultation for panniculectomy and brachioplasty.  She underwent a gastric sleeve in 2017 by Dr. Redmond Pulling and was 330 pounds at the time.  She has done extremely well with a reduction of her weight to it now being 227 pounds.  She is 5 feet 3 inches tall.  She gets mammograms with Dr. Lisbeth Renshaw and had one in May 2019 and it was negative.  She does not have a family history of breast cancer.  Her bra size is a 30 8B.  She has had a cholecystectomy, hysterectomy, sinus surgery and right knee surgery.  She is interested in a panniculectomy and brachioplasty.  She states that it is very difficult for her to find close that fit now that she has lost the weight.  The size of her arms makes her have to go with a much larger shirt or dress.  Her mobility is hindered by the pannus.  She often has to use gauze or powder to keep from skin breakdown.  She has been able to maintain her current weight for over a year.  She is still involved in health and wellness and has been doing very well with achieving her weight goals.  She has also noticed neck and back pain due to the excess weight.    Review of Systems  Constitutional: Positive for activity change.  HENT: Negative.   Eyes: Negative.   Respiratory: Negative.   Gastrointestinal: Negative.  Negative for abdominal distention and abdominal pain.  Endocrine: Negative.   Genitourinary: Negative.   Musculoskeletal: Positive for back pain and neck pain.  Skin: Positive for rash.  Neurological: Negative.   Psychiatric/Behavioral: Negative.     Past Medical History:  Diagnosis Date  . Allergic rhinitis   . Anxiety   . Asthma   . Bulging disc   . Cancer (HCC)    cervical cancer   . Chronic back pain   . Chronic cough   . Depression   . Diabetes mellitus    diet control only  . Diverticulosis   . Dyspnea    chronic  . Family history of adverse reaction to anesthesia    pts daughter had reaction to profolol - difficulty with breathing   . GERD (gastroesophageal reflux disease)   . High cholesterol   . History of bronchitis   . Hx of adenomatous and sessile serrated colonic polyps   . Hypertension   . Hypothyroidism   . Lumbosacral spondylosis without myelopathy    history bulging disc-chronic pain(level 3 to 8).  . Normal cardiac stress test 02/2015   with normal EF  . Numbness    feet bilat comes and goes   . Obesity   . Obstructive sleep apnea    uses c-pap at night  . Palpitations   . Pneumonia    last episode 2012  . PONV (postoperative nausea and vomiting)   . Prediabetes   . Restless leg   . Vitamin D deficiency   . Vocal cord dysfunction     Past Surgical History:  Procedure Laterality Date  . ABDOMINAL HYSTERECTOMY  1996  . CHOLECYSTECTOMY  1994  . COLONOSCOPY WITH PROPOFOL N/A 03/10/2015   Procedure: COLONOSCOPY WITH PROPOFOL;  Surgeon: [name], MD;  Location: [facility];  Service: Endoscopy;  Laterality: N/A;  . ESOPHAGOGASTRODUODENOSCOPY N/A 10/27/2013   Procedure: ESOPHAGOGASTRODUODENOSCOPY (EGD);  Surgeon: [name], MD;  Location: [facility];  Service: Endoscopy;  Laterality: N/A;  . KNEE ARTHROSCOPY Right   . LAPAROSCOPIC GASTRIC SLEEVE RESECTION WITH HIATAL HERNIA REPAIR N/A 09/19/2015   Procedure: LAPAROSCOPIC GASTRIC SLEEVE RESECTION WITH HIATAL HERNIA REPAIR;  Surgeon: [name], MD;  Location: [facility];  Service: General;  Laterality: N/A;  . NOSE SURGERY  2004  . ovaries removed    . TUBAL LIGATION    . UPPER GI ENDOSCOPY  09/19/2015   Procedure: UPPER GI ENDOSCOPY;  Surgeon: [name], MD;  Location: [facility];  Service: General;;      Current Outpatient Medications:  .  buPROPion (WELLBUTRIN SR) 150 MG 12 hr tablet, Take 1 tablet (150 mg total) by mouth daily., Disp: 30 tablet, Rfl: 0 .   dexlansoprazole (DEXILANT) 60 MG capsule, Take 1 capsule (60 mg total) by mouth daily before breakfast., Disp: 90 capsule, Rfl: 3 .  levothyroxine (SYNTHROID, LEVOTHROID) 125 MCG tablet, Take 1 tablet (125 mcg total) by mouth daily., Disp: 16 tablet, Rfl: 0 .  metFORMIN (GLUCOPHAGE) 500 MG tablet, Take 1 tablet (500 mg total) by mouth daily with breakfast., Disp: 30 tablet, Rfl: 0 .  Naltrexone-buPROPion HCl ER (CONTRAVE) 8-90 MG TB12, Take two tablets in the morning and one tab in the evening for 1 week and then increase to two tablets twice daily, Disp: 60 tablet, Rfl: 0 .  nystatin (MYCOSTATIN/NYSTOP) powder, Apply topically 2 (two) times daily., Disp: 15 g, Rfl: 0 .  nystatin-triamcinolone ointment (MYCOLOG), Apply 1 application topically 2 (two) times daily., Disp: 30 g, Rfl: 1 .  Vitamin D, Ergocalciferol, (DRISDOL) 1.25 MG (50000 UT) CAPS capsule, Take 1 capsule (50,000 Units total) by mouth every 7 (seven) days., Disp: 4 capsule, Rfl: 0  Current Facility-Administered Medications:  .  0.9 %  sodium chloride infusion, 500 mL, Intravenous, Once, [name], MD   Objective:   Vitals:   08/09/18 2054  BP: 120/80  Resp: 14  SpO2: 99%    Physical Exam  Constitutional: She appears well-developed and well-nourished.  HENT:  Head: Normocephalic and atraumatic.  Eyes: Pupils are equal, round, and reactive to light. EOM are normal.  Cardiovascular: Normal rate.  Pulmonary/Chest: Effort normal.  Abdominal: Soft. She exhibits no distension.  Musculoskeletal: She exhibits no edema.  Skin: Skin is warm.  Psychiatric: She has a normal mood and affect. Her behavior is normal. Judgment and thought content normal.    Assessment & Plan:  Neck pain  Chronic bilateral thoracic back pain  Panniculitis  Lipodystrophy I think the patient would benefit from a panniculectomy.  A brachioplasty is certainly possible and an option for the future.  I did express to her that these would not be  done at the same time.  I encouraged her to continue with her weight reduction goals.  I also stated that the best surgical options are after she has achieved her ideal weight.  The surgery prior to that may not take off as much as she ultimately would want.  [place], DO

## 2018-08-09 DIAGNOSIS — E881 Lipodystrophy, not elsewhere classified: Secondary | ICD-10-CM | POA: Insufficient documentation

## 2018-08-10 ENCOUNTER — Encounter: Payer: Self-pay | Admitting: Plastic Surgery

## 2018-08-10 NOTE — Progress Notes (Signed)
Office: [phone]  /  Fax: [phone]   HPI:   Chief Complaint: OBESITY Kristina [name] is here to discuss her progress with her obesity treatment plan. She is following the Category 2 plan and is following her eating plan approximately 20 % of the time. She states she is exercising 0 minutes 0 times per week. Kristina [name] did not do well on Contrave, she said that she lost all joy in eating. She has felt poorly for the past week with a URI and has been eating mostly soup and crackers. Kristina [name] has no plans for a large Thanksgiving dinner. She has a grandchild that was born early with pre-eclampsia.Kristina [name]  Her weight is 227 lb (103 kg) today and has gained 1 lb since her last visit. She has lost 22 lbs since starting treatment with [place].  Vitamin D deficiency Kristina [name] has a diagnosis of vitamin D deficiency. She is stable taking prescription Vit D and denies nausea, vomiting or muscle weakness. Shephanie does complain of fatigue.   Diabetes II with hyperglycemia Kristina [name] has a diagnosis of diabetes type II. Kristina [name]  admits to hyperglycemic episodes. Last A1c was Hemoglobin A1C Latest Ref Rng & Units 07/16/2018 02/05/2018  HGBA1C 4.8 - 5.6 % 5.5 5.4  Some recent data might be hidden    She has been working on intensive lifestyle modifications including diet, exercise, and weight loss to help control her blood glucose levels. She denies GI symptoms on Metformin.   Hypothyroid Kristina [name] has a diagnosis of hypothyroidism. She is on levothyroxine. She denies hot or cold intolerance or palpitations, but does admit to ongoing fatigue.  Depression with emotional eating behaviors Kristina [name] is struggling with emotional eating and using food for comfort to the extent that it is negatively impacting her health. She often snacks when she is not hungry. Kristina [name] sometimes feels she is out of control and then feels guilty that she made poor food choices. She has been working on behavior modification techniques to help reduce her emotional eating  and has been somewhat successful. She shows no sign of suicidal or homicidal ideations. Kristina [name] previously weaned down on Wellbutrin SR.   Depression screen [facility] 2/9 01/21/2018 04/19/2016 01/24/2016 11/20/2015 10/03/2015  Decreased Interest 3 0 0 0 0  Down, Depressed, Hopeless 3 0 0 0 0  PHQ - 2 Score 6 0 0 0 0  Altered sleeping 2 - - - -  Tired, decreased energy 3 - - - -  Change in appetite 3 - - - -  Feeling bad or failure about yourself  3 - - - -  Trouble concentrating 1 - - - -  Moving slowly or fidgety/restless 1 - - - -  Suicidal thoughts 0 - - - -  PHQ-9 Score 19 - - - -  Difficult doing work/chores Very difficult - - - -     ALLERGIES: Allergies  Allergen Reactions  . Ampicillin Anaphylaxis    Has patient had a PCN reaction causing immediate rash, facial/tongue/throat swelling, SOB or lightheadedness with hypotension: Yes Has patient had a PCN reaction causing severe rash involving mucus membranes or skin necrosis: Yes Has patient had a PCN reaction that required hospitalization Yes Has patient had a PCN reaction occurring within the last 10 years: No If all of the above answers are "NO", then may proceed with Cephalosporin use.   . Codeine Hives and Shortness Of Breath  . Sulfonamide Derivatives Itching and Rash    MEDICATIONS: Current Outpatient Medications on File Prior to Visit  Medication Sig Dispense Refill  . dexlansoprazole (DEXILANT) 60 MG capsule Take 1 capsule (60 mg total) by mouth daily before breakfast. 90 capsule 3  . Naltrexone-buPROPion HCl ER (CONTRAVE) 8-90 MG TB12 Take two tablets in the morning and one tab in the evening for 1 week and then increase to two tablets twice daily 60 tablet 0  . nystatin (MYCOSTATIN/NYSTOP) powder Apply topically 2 (two) times daily. 15 g 0  . nystatin-triamcinolone ointment (MYCOLOG) Apply 1 application topically 2 (two) times daily. 30 g 1   Current Facility-Administered Medications on File Prior to Visit  Medication Dose  Route Frequency Provider Last Rate Last Dose  . 0.9 %  sodium chloride infusion  500 mL Intravenous Once Kristina Mayer, MD        PAST MEDICAL HISTORY: Past Medical History:  Diagnosis Date  . Allergic rhinitis   . Anxiety   . Asthma   . Bulging disc   . Cancer (HCC)    cervical cancer   . Chronic back pain   . Chronic cough   . Depression   . Diabetes mellitus    diet control only  . Diverticulosis   . Dyspnea    chronic  . Family history of adverse reaction to anesthesia    pts daughter had reaction to profolol - difficulty with breathing   . GERD (gastroesophageal reflux disease)   . High cholesterol   . History of bronchitis   . Hx of adenomatous and sessile serrated colonic polyps   . Hypertension   . Hypothyroidism   . Lumbosacral spondylosis without myelopathy    history bulging disc-chronic pain(level 3 to 8).  . Normal cardiac stress test 02/2015   with normal EF  . Numbness    feet bilat comes and goes   . Obesity   . Obstructive sleep apnea    uses c-pap at night  . Palpitations   . Pneumonia    last episode 2012  . PONV (postoperative nausea and vomiting)   . Prediabetes   . Restless leg   . Vitamin D deficiency   . Vocal cord dysfunction     PAST SURGICAL HISTORY: Past Surgical History:  Procedure Laterality Date  . ABDOMINAL HYSTERECTOMY  1996  . CHOLECYSTECTOMY  1994  . COLONOSCOPY WITH PROPOFOL N/A 03/10/2015   Procedure: COLONOSCOPY WITH PROPOFOL;  Surgeon: Kristina Mayer, MD;  Location: [facility];  Service: Endoscopy;  Laterality: N/A;  . ESOPHAGOGASTRODUODENOSCOPY N/A 10/27/2013   Procedure: ESOPHAGOGASTRODUODENOSCOPY (EGD);  Surgeon: Kristina Mayer, MD;  Location: [facility];  Service: Endoscopy;  Laterality: N/A;  . KNEE ARTHROSCOPY Right   . LAPAROSCOPIC GASTRIC SLEEVE RESECTION WITH HIATAL HERNIA REPAIR N/A 09/19/2015   Procedure: LAPAROSCOPIC GASTRIC SLEEVE RESECTION WITH HIATAL HERNIA REPAIR;  Surgeon: Kristina Pickerel, MD;  Location:  [facility];  Service: General;  Laterality: N/A;  . NOSE SURGERY  2004  . ovaries removed    . TUBAL LIGATION    . UPPER GI ENDOSCOPY  09/19/2015   Procedure: UPPER GI ENDOSCOPY;  Surgeon: Kristina Pickerel, MD;  Location: [facility];  Service: General;;    SOCIAL HISTORY: Social History   Tobacco Use  . Smoking status: Never Smoker  . Smokeless tobacco: Never Used  Substance Use Topics  . Alcohol use: No  . Drug use: No    FAMILY HISTORY: Family History  Problem Relation Age of Onset  . Emphysema Mother   . Allergies Mother   . Heart disease Mother   .  Asthma Mother   . Ovarian cancer Mother   . Hypertension Mother   . Diabetes Mother   . High Cholesterol Mother   . Stroke Mother   . Thyroid disease Mother   . Depression Mother   . Anxiety disorder Mother   . Heart disease Father        deceased at age 27 from heart attack  . Sudden death Father   . High Cholesterol Father   . High blood pressure Father   . Stroke Maternal Grandmother   . Heart disease Maternal Grandmother   . Cancer Maternal Grandfather   . Heart disease Paternal Grandmother   . Multiple sclerosis Paternal Grandmother   . Heart attack Paternal Grandfather   . Thyroid disease Brother        died at 17  . Heart attack Brother        died at 72  . COPD Brother   . Colon cancer Brother 39  . Hyperthyroidism Sister   . Hypertension Brother   . Hypertension Daughter   . Allergies Child   . Asthma Son   . Asthma Brother   . Asthma Brother   . Lung cancer Maternal Aunt   . COPD Sister        died at 19  . Sudden death Sister   . Asthma Son     ROS: Review of Systems  Constitutional: Positive for malaise/fatigue. Negative for weight loss.  Cardiovascular: Negative for palpitations.  Gastrointestinal: Negative for nausea and vomiting.  Musculoskeletal:       Negative for muscle weakness  Neurological:       Negative heat intolerance Negative cold intolerance  Endo/Heme/Allergies:       Positive for  hyperglycemia  Psychiatric/Behavioral: Positive for depression. Negative for suicidal ideas.       Negative homicidal ideations    PHYSICAL EXAM: Blood pressure 117/83, pulse 78, temperature 97.6 F (36.4 C), temperature source Oral, height 5\' 3"  (1.6 m), weight 227 lb (103 kg), SpO2 97 %. Body mass index is 40.21 kg/m. Physical Exam  Constitutional: She is oriented to person, place, and time. She appears well-developed and well-nourished.  Cardiovascular: Normal rate.  Pulmonary/Chest: Effort normal.  Musculoskeletal: Normal range of motion.  Neurological: She is alert and oriented to person, place, and time.  Skin: Skin is warm and dry.  Psychiatric: She has a normal mood and affect. Her behavior is normal.  Vitals reviewed.   RECENT LABS AND TESTS: BMET    Component Value Date/Time   NA 140 02/05/2018 1055   K 4.5 02/05/2018 1055   CL 105 02/05/2018 1055   CO2 22 02/05/2018 1055   GLUCOSE 91 02/05/2018 1055   GLUCOSE 84 11/10/2015 0527   BUN 10 02/05/2018 1055   CREATININE 0.62 02/05/2018 1055   CREATININE 0.88 02/02/2015 1458   CREATININE 0.88 02/02/2015 1458   CALCIUM 9.3 02/05/2018 1055   GFRNONAA 108 02/05/2018 1055   GFRAA 125 02/05/2018 1055   Lab Results  Component Value Date   HGBA1C 5.5 07/16/2018   HGBA1C 5.4 02/05/2018   HGBA1C 6.3 (H) 09/14/2015   HGBA1C (H) 02/12/2011    6.2 (NOTE)  According to the ADA Clinical Practice Recommendations for 2011, when HbA1c is used as a screening test:   >=6.5%   Diagnostic of Diabetes Mellitus           (if abnormal result  is confirmed)  5.7-6.4%   Increased risk of developing Diabetes Mellitus  References:Diagnosis and Classification of Diabetes Mellitus,Diabetes PXTG,6269,48(NIOEV 1):S62-S69 and Standards of Medical Care in         Diabetes - 2011,Diabetes OJJK,0938,18  (Suppl 1):S11-S61.   HGBA1C 6.2 (H) 02/02/2008   Lab Results  Component  Value Date   INSULIN 6.9 07/16/2018   INSULIN 11.0 01/21/2018   CBC    Component Value Date/Time   WBC 5.8 02/05/2018 1055   WBC 3.8 (L) 11/10/2015 0527   RBC 4.43 02/05/2018 1055   RBC 4.46 11/10/2015 0527   HGB 12.4 02/05/2018 1055   HCT 37.3 02/05/2018 1055   PLT 121 (L) 11/10/2015 0527   MCV 84 02/05/2018 1055   MCH 28.0 02/05/2018 1055   MCH 27.4 11/10/2015 0527   MCHC 33.2 02/05/2018 1055   MCHC 31.3 11/10/2015 0527   RDW 13.9 02/05/2018 1055   LYMPHSABS 2.6 02/05/2018 1055   MONOABS 0.8 11/09/2015 0515   EOSABS 0.1 02/05/2018 1055   BASOSABS 0.0 02/05/2018 1055   Iron/TIBC/Ferritin/ %Sat No results found for: IRON, TIBC, FERRITIN, IRONPCTSAT Lipid Panel     Component Value Date/Time   CHOL 233 (H) 02/05/2018 1055   TRIG 157 (H) 02/05/2018 1055   HDL 51 02/05/2018 1055   CHOLHDL 4.5 02/12/2008 0540   VLDL 25 02/12/2008 0540   LDLCALC 151 (H) 02/05/2018 1055   Hepatic Function Panel     Component Value Date/Time   PROT 6.8 02/05/2018 1055   ALBUMIN 4.2 02/05/2018 1055   AST 16 02/05/2018 1055   ALT 11 02/05/2018 1055   ALKPHOS 110 02/05/2018 1055   BILITOT 0.3 02/05/2018 1055      Component Value Date/Time   TSH 0.481 07/16/2018 1259   TSH 0.593 05/12/2018 1451   TSH 0.329 (L) 03/16/2018 1618   Results for AKSHAYA, TOEPFER (MRN [id number]) as of 08/10/2018 18:16  Ref. Range 01/21/2018 10:56  Vitamin D, 25-Hydroxy Latest Ref Range: 30.0 - 100.0 ng/mL 28.8 (L)    ASSESSMENT AND PLAN: Vitamin D deficiency - Plan: Vitamin D, Ergocalciferol, (DRISDOL) 1.25 MG (50000 UT) CAPS capsule  Type 2 diabetes mellitus without complication, without long-term current use of insulin (HCC) - Plan: metFORMIN (GLUCOPHAGE) 500 MG tablet  Other specified hypothyroidism - Plan: levothyroxine (SYNTHROID, LEVOTHROID) 125 MCG tablet  Other depression - with emotional eating - Plan: buPROPion (WELLBUTRIN SR) 150 MG 12 hr tablet  Class 3 severe obesity with serious  comorbidity and body mass index (BMI) of 40.0 to 44.9 in adult, unspecified obesity type (HCC)  PLAN: Vitamin D Deficiency Thresa was informed that low vitamin D levels contributes to fatigue and are associated with obesity, breast, and colon cancer. She agrees to continue taking prescription Vit D @50 ,000 IU every week #4 with no refills. She will follow up for routine testing of vitamin D, at least 2-3 times per year. She was informed of the risk of over-replacement of vitamin D and agrees to not increase her dose unless she discusses this with [place] first. Mathilde agrees to follow up with our office in 2 weeks.   Diabetes II Tiaria has been given extensive diabetes education by myself today including ideal fasting and post-prandial blood glucose readings, individual ideal HgA1c goals  and hypoglycemia prevention. We discussed the importance of good blood sugar control to decrease the likelihood of diabetic complications such as nephropathy, neuropathy, limb loss, blindness, coronary artery disease, and death. We discussed the importance of intensive lifestyle modification including diet, exercise and weight loss as the first line treatment for diabetes. Nakeya agrees to continue taking Metformin 500 mg qd #30 with no refills. Marian agrees to to follow up with our office in 2 weeks.   Hypothyroid Conita was informed of the importance of good thyroid control to help with weight loss efforts. She was also informed that supertheraputic thyroid levels are dangerous and will not improve weight loss results. She will continue taking Levothyroxine 125 mg qd #30 with no refills. Leveda Anna agrees to follow up with our office in 2 weeks.   Depression with Emotional Eating Behaviors We discussed behavior modification techniques today to help Domitila deal with her emotional eating and depression. She has agreed to take Wellbutrin SR 150 mg qd #30 with no refills. Shabreka agrees to follow up with our office in 2 weeks.    Obesity Lorana is currently in the action stage of change. As such, her goal is to continue with weight loss efforts She has agreed to follow the Category 2 plan Cinzia has been instructed to work up to a goal of 150 minutes of combined cardio and strengthening exercise per week for weight loss and overall health benefits. We discussed the following Behavioral Modification Strategies today: increasing lean protein intake, increasing vegetables, work on meal planning and easy cooking plans and holiday eating strategies   Venia has agreed to follow up with our clinic in 2 weeks. She was informed of the importance of frequent follow up visits to maximize her success with intensive lifestyle modifications for her multiple health conditions.   OBESITY BEHAVIORAL INTERVENTION VISIT  Today's visit was # 11   Starting weight: 248 lbs Starting date: 01/21/2018 Today's weight : Weight: 227 lb (103 kg)  Today's date: 08/05/2018 Total lbs lost to date: 22 lbs At least 15 minutes were spent on discussing the following behavioral intervention visit.   ASK: We discussed the diagnosis of obesity with Princess Perna today and Mone agreed to give [place] permission to discuss obesity behavioral modification therapy today.  ASSESS: Gwenn has the diagnosis of obesity and her BMI today is 75.22 Rosi is in the action stage of change   ADVISE: Analisia was educated on the multiple health risks of obesity as well as the benefit of weight loss to improve her health. She was advised of the need for long term treatment and the importance of lifestyle modifications to improve her current health and to decrease her risk of future health problems.  AGREE: Multiple dietary modification options and treatment options were discussed and  Nazly agreed to follow the recommendations documented in the above note.  ARRANGE: Ahna was educated on the importance of frequent visits to treat obesity as outlined per CMS and  USPSTF guidelines and agreed to schedule her next follow up appointment today.  Felipa Emory, CMA, am acting as transcriptionist for Ilene Qua, MD  I have reviewed the above documentation for accuracy and completeness, and I agree with the above. - Ilene Qua, MD

## 2018-08-19 ENCOUNTER — Ambulatory Visit (INDEPENDENT_AMBULATORY_CARE_PROVIDER_SITE_OTHER): Payer: Medicare Other | Admitting: Family Medicine

## 2018-08-19 VITALS — BP 129/82 | HR 94 | Temp 97.9°F | Ht 63.0 in | Wt 228.0 lb

## 2018-08-19 DIAGNOSIS — F3289 Other specified depressive episodes: Secondary | ICD-10-CM

## 2018-08-19 DIAGNOSIS — E1165 Type 2 diabetes mellitus with hyperglycemia: Secondary | ICD-10-CM | POA: Diagnosis not present

## 2018-08-19 DIAGNOSIS — E559 Vitamin D deficiency, unspecified: Secondary | ICD-10-CM

## 2018-08-19 DIAGNOSIS — Z794 Long term (current) use of insulin: Secondary | ICD-10-CM

## 2018-08-19 DIAGNOSIS — E038 Other specified hypothyroidism: Secondary | ICD-10-CM | POA: Diagnosis not present

## 2018-08-19 DIAGNOSIS — Z6841 Body Mass Index (BMI) 40.0 and over, adult: Secondary | ICD-10-CM

## 2018-08-19 MED ORDER — BUPROPION HCL ER (SR) 150 MG PO TB12: 150.0000 mg | ORAL_TABLET | Freq: Two times a day (BID) | ORAL | 0 refills | Status: DC

## 2018-08-19 MED ORDER — VITAMIN D (ERGOCALCIFEROL) 1.25 MG (50000 UNIT) PO CAPS: 50000.0000 [IU] | ORAL_CAPSULE | ORAL | 0 refills | Status: DC

## 2018-08-19 MED ORDER — LEVOTHYROXINE SODIUM 125 MCG PO TABS: 125.0000 ug | ORAL_TABLET | Freq: Every day | ORAL | 0 refills | Status: DC

## 2018-08-19 MED ORDER — METFORMIN HCL 500 MG PO TABS: 500.0000 mg | ORAL_TABLET | Freq: Every day | ORAL | 0 refills | Status: DC

## 2018-08-24 NOTE — Progress Notes (Signed)
Office: [phone]  /  Fax: [phone]   HPI:   Chief Complaint: OBESITY Kristina [name] is here to discuss her progress with her obesity treatment plan. She is on the Category 2 plan and is following her eating plan approximately 40 % of the time. She states she is exercising 0 minutes 0 times per week. Kristina [name] hasn't been feeling well with an upper respiratory infection and has been eating a lot of soups and carbohydrates. She is feeling slightly down, and thinks she needs an increase in Wellbutrin.  Her weight is 228 lb (103.4 kg) today and has gained 1 pound since her last visit. She has lost 20 lbs since starting treatment with [place].  Hypothyroidism Kristina [name] has a diagnosis of hypothyroidism. She is on levothyroxine. She denies hot or cold intolerance or palpitations, but does admit to ongoing fatigue.  Vitamin D Deficiency Kristina [name] has a diagnosis of vitamin D deficiency. She is currently taking prescription Vit D. She notes fatigue and denies nausea, vomiting or muscle weakness.  Diabetes II with Hyperglycemia Kristina [name] has a diagnosis of diabetes type II. Kristina [name] notes carbohydrate cravings and she denies GI side effects of metformin. She denies any hypoglycemic episodes. Last A1c was 5.5. She has been working on intensive lifestyle modifications including diet, exercise, and weight loss to help control her blood glucose levels.  Depression with emotional eating behaviors Kristina [name] notes symptoms of apathy have increased since her last appointment. Kristina [name] struggles with emotional eating and using food for comfort to the extent that it is negatively impacting her health. She often snacks when she is not hungry. Kristina [name] sometimes feels she is out of control and then feels guilty that she made poor food choices. She has been working on behavior modification techniques to help reduce her emotional eating and has been somewhat successful. She shows no sign of suicidal or homicidal ideations.  Depression screen [facility]  2/9 01/21/2018 04/19/2016 01/24/2016 11/20/2015 10/03/2015  Decreased Interest 3 0 0 0 0  Down, Depressed, Hopeless 3 0 0 0 0  PHQ - 2 Score 6 0 0 0 0  Altered sleeping 2 - - - -  Tired, decreased energy 3 - - - -  Change in appetite 3 - - - -  Feeling bad or failure about yourself  3 - - - -  Trouble concentrating 1 - - - -  Moving slowly or fidgety/restless 1 - - - -  Suicidal thoughts 0 - - - -  PHQ-9 Score 19 - - - -  Difficult doing work/chores Very difficult - - - -    ALLERGIES: Allergies  Allergen Reactions  . Ampicillin Anaphylaxis    Has patient had a PCN reaction causing immediate rash, facial/tongue/throat swelling, SOB or lightheadedness with hypotension: Yes Has patient had a PCN reaction causing severe rash involving mucus membranes or skin necrosis: Yes Has patient had a PCN reaction that required hospitalization Yes Has patient had a PCN reaction occurring within the last 10 years: No If all of the above answers are "NO", then may proceed with Cephalosporin use.   . Codeine Hives and Shortness Of Breath  . Sulfonamide Derivatives Itching and Rash    MEDICATIONS: Current Outpatient Medications on File Prior to Visit  Medication Sig Dispense Refill  . dexlansoprazole (DEXILANT) 60 MG capsule Take 1 capsule (60 mg total) by mouth daily before breakfast. 90 capsule 3  . Naltrexone-buPROPion HCl ER (CONTRAVE) 8-90 MG TB12 Take two tablets in the morning and one tab in the  evening for 1 week and then increase to two tablets twice daily 60 tablet 0  . nystatin (MYCOSTATIN/NYSTOP) powder Apply topically 2 (two) times daily. 15 g 0  . nystatin-triamcinolone ointment (MYCOLOG) Apply 1 application topically 2 (two) times daily. 30 g 1   Current Facility-Administered Medications on File Prior to Visit  Medication Dose Route Frequency Provider Last Rate Last Dose  . 0.9 %  sodium chloride infusion  500 mL Intravenous Once [name], MD        PAST MEDICAL HISTORY: Past  Medical History:  Diagnosis Date  . Allergic rhinitis   . Anxiety   . Asthma   . Bulging disc   . Cancer (HCC)    cervical cancer   . Chronic back pain   . Chronic cough   . Depression   . Diabetes mellitus    diet control only  . Diverticulosis   . Dyspnea    chronic  . Family history of adverse reaction to anesthesia    pts daughter had reaction to profolol - difficulty with breathing   . GERD (gastroesophageal reflux disease)   . High cholesterol   . History of bronchitis   . Hx of adenomatous and sessile serrated colonic polyps   . Hypertension   . Hypothyroidism   . Lumbosacral spondylosis without myelopathy    history bulging disc-chronic pain(level 3 to 8).  . Normal cardiac stress test 02/2015   with normal EF  . Numbness    feet bilat comes and goes   . Obesity   . Obstructive sleep apnea    uses c-pap at night  . Palpitations   . Pneumonia    last episode 2012  . PONV (postoperative nausea and vomiting)   . Prediabetes   . Restless leg   . Vitamin D deficiency   . Vocal cord dysfunction     PAST SURGICAL HISTORY: Past Surgical History:  Procedure Laterality Date  . ABDOMINAL HYSTERECTOMY  1996  . CHOLECYSTECTOMY  1994  . COLONOSCOPY WITH PROPOFOL N/A 03/10/2015   Procedure: COLONOSCOPY WITH PROPOFOL;  Surgeon: [name], MD;  Location: [facility];  Service: Endoscopy;  Laterality: N/A;  . ESOPHAGOGASTRODUODENOSCOPY N/A 10/27/2013   Procedure: ESOPHAGOGASTRODUODENOSCOPY (EGD);  Surgeon: [name], MD;  Location: [facility];  Service: Endoscopy;  Laterality: N/A;  . KNEE ARTHROSCOPY Right   . LAPAROSCOPIC GASTRIC SLEEVE RESECTION WITH HIATAL HERNIA REPAIR N/A 09/19/2015   Procedure: LAPAROSCOPIC GASTRIC SLEEVE RESECTION WITH HIATAL HERNIA REPAIR;  Surgeon: [name], MD;  Location: [facility];  Service: General;  Laterality: N/A;  . NOSE SURGERY  2004  . ovaries removed    . TUBAL LIGATION    . UPPER GI ENDOSCOPY  09/19/2015   Procedure: UPPER  GI ENDOSCOPY;  Surgeon: [name], MD;  Location: [facility];  Service: General;;    SOCIAL HISTORY: Social History   Tobacco Use  . Smoking status: Never Smoker  . Smokeless tobacco: Never Used  Substance Use Topics  . Alcohol use: No  . Drug use: No    FAMILY HISTORY: Family History  Problem Relation Age of Onset  . Emphysema Mother   . Allergies Mother   . Heart disease Mother   . Asthma Mother   . Ovarian cancer Mother   . Hypertension Mother   . Diabetes Mother   . High Cholesterol Mother   . Stroke Mother   . Thyroid disease Mother   . Depression Mother   . Anxiety  disorder Mother   . Heart disease Father        deceased at age 3 from heart attack  . Sudden death Father   . High Cholesterol Father   . High blood pressure Father   . Stroke Maternal Grandmother   . Heart disease Maternal Grandmother   . Cancer Maternal Grandfather   . Heart disease Paternal Grandmother   . Multiple sclerosis Paternal Grandmother   . Heart attack Paternal Grandfather   . Thyroid disease Brother        died at 68  . Heart attack Brother        died at 23  . COPD Brother   . Colon cancer Brother 20  . Hyperthyroidism Sister   . Hypertension Brother   . Hypertension Daughter   . Allergies Child   . Asthma Son   . Asthma Brother   . Asthma Brother   . Lung cancer Maternal Aunt   . COPD Sister        died at 57  . Sudden death Sister   . Asthma Son     ROS: Review of Systems  Constitutional: Positive for malaise/fatigue. Negative for weight loss.  Cardiovascular: Negative for palpitations.  Gastrointestinal: Negative for nausea and vomiting.  Musculoskeletal:       Negative muscle weakness  Endo/Heme/Allergies:       Negative hot/cold intolerance Negative hypoglycemia  Psychiatric/Behavioral: Positive for depression. Negative for suicidal ideas.    PHYSICAL EXAM: Blood pressure 129/82, pulse 94, temperature 97.9 F (36.6 C), temperature source Oral, height 5\' 3"   (1.6 m), weight 228 lb (103.4 kg), SpO2 96 %. Body mass index is 40.39 kg/m. Physical Exam  Constitutional: She is oriented to person, place, and time. She appears well-developed and well-nourished.  Cardiovascular: Normal rate.  Pulmonary/Chest: Effort normal.  Musculoskeletal: Normal range of motion.  Neurological: She is oriented to person, place, and time.  Skin: Skin is warm and dry.  Psychiatric: She has a normal mood and affect. Her behavior is normal.  Vitals reviewed.   RECENT LABS AND TESTS: BMET    Component Value Date/Time   NA 140 02/05/2018 1055   K 4.5 02/05/2018 1055   CL 105 02/05/2018 1055   CO2 22 02/05/2018 1055   GLUCOSE 91 02/05/2018 1055   GLUCOSE 84 11/10/2015 0527   BUN 10 02/05/2018 1055   CREATININE 0.62 02/05/2018 1055   CREATININE 0.88 02/02/2015 1458   CREATININE 0.88 02/02/2015 1458   CALCIUM 9.3 02/05/2018 1055   GFRNONAA 108 02/05/2018 1055   GFRAA 125 02/05/2018 1055   Lab Results  Component Value Date   HGBA1C 5.5 07/16/2018   HGBA1C 5.4 02/05/2018   HGBA1C 6.3 (H) 09/14/2015   HGBA1C (H) 02/12/2011    6.2 (NOTE)                                                                       According to the ADA Clinical Practice Recommendations for 2011, when HbA1c is used as a screening test:   >=6.5%   Diagnostic of Diabetes Mellitus           (if abnormal result  is confirmed)  5.7-6.4%   Increased risk of developing Diabetes Mellitus  References:Diagnosis and Classification of Diabetes Mellitus,Diabetes [id number],2637,85(YIFOY 1):S62-S69 and Standards of Medical Care in         Diabetes - 2011,Diabetes [id number]  (Suppl 1):S11-S61.   HGBA1C 6.2 (H) 02/02/2008   Lab Results  Component Value Date   INSULIN 6.9 07/16/2018   INSULIN 11.0 01/21/2018   CBC    Component Value Date/Time   WBC 5.8 02/05/2018 1055   WBC 3.8 (L) 11/10/2015 0527   RBC 4.43 02/05/2018 1055   RBC 4.46 11/10/2015 0527   HGB 12.4 02/05/2018 1055   HCT 37.3  02/05/2018 1055   PLT 121 (L) 11/10/2015 0527   MCV 84 02/05/2018 1055   MCH 28.0 02/05/2018 1055   MCH 27.4 11/10/2015 0527   MCHC 33.2 02/05/2018 1055   MCHC 31.3 11/10/2015 0527   RDW 13.9 02/05/2018 1055   LYMPHSABS 2.6 02/05/2018 1055   MONOABS 0.8 11/09/2015 0515   EOSABS 0.1 02/05/2018 1055   BASOSABS 0.0 02/05/2018 1055   Iron/TIBC/Ferritin/ %Sat No results found for: IRON, TIBC, FERRITIN, IRONPCTSAT Lipid Panel     Component Value Date/Time   CHOL 233 (H) 02/05/2018 1055   TRIG 157 (H) 02/05/2018 1055   HDL 51 02/05/2018 1055   CHOLHDL 4.5 02/12/2008 0540   VLDL 25 02/12/2008 0540   LDLCALC 151 (H) 02/05/2018 1055   Hepatic Function Panel     Component Value Date/Time   PROT 6.8 02/05/2018 1055   ALBUMIN 4.2 02/05/2018 1055   AST 16 02/05/2018 1055   ALT 11 02/05/2018 1055   ALKPHOS 110 02/05/2018 1055   BILITOT 0.3 02/05/2018 1055      Component Value Date/Time   TSH 0.481 07/16/2018 1259   TSH 0.593 05/12/2018 1451   TSH 0.329 (L) 03/16/2018 1618   Results for TASHIANNA, BROOME (MRN [id number]) as of 08/24/2018 18:02  Ref. Range 07/16/2018 12:59  Vitamin D, 25-Hydroxy Latest Ref Range: 30.0 - 100.0 ng/mL 42.2   ASSESSMENT AND PLAN: Other specified hypothyroidism - Plan: levothyroxine (SYNTHROID, LEVOTHROID) 125 MCG tablet  Vitamin D deficiency - Plan: Vitamin D, Ergocalciferol, (DRISDOL) 1.25 MG (50000 UT) CAPS capsule  Type 2 diabetes mellitus with hyperglycemia, with long-term current use of insulin (HCC) - Plan: metFORMIN (GLUCOPHAGE) 500 MG tablet  Other depression - with emotional eating - Plan: buPROPion (WELLBUTRIN SR) 150 MG 12 hr tablet  Class 3 severe obesity with serious comorbidity and body mass index (BMI) of 40.0 to 44.9 in adult, unspecified obesity type ([place])  PLAN:  Hypothyroidism Kristina [name] was informed of the importance of good thyroid control to help with weight loss efforts. She was also informed that supertheraputic thyroid levels  are dangerous and will not improve weight loss results. Kristina [name] agrees to continue taking levothyroxine 125 mcg PO q AM #30 and we will refill for 1 month. Kristina [name] agrees to follow up with our clinic in 2 weeks.  Vitamin D Deficiency Kristina [name] was informed that low vitamin D levels contributes to fatigue and are associated with obesity, breast, and colon cancer. Kristina [name] agrees to continue taking prescription Vit D @50 ,000 IU every week #4 and we will refill for 1 month. She will follow up for routine testing of vitamin D, at least 2-3 times per year. She was informed of the risk of over-replacement of vitamin D and agrees to not increase her dose unless she discusses this with [place] first. Kristina [name] agrees to follow up with our clinic in 2 weeks.  Diabetes II Kristina [name] has been given extensive diabetes education by  myself today including ideal fasting and post-prandial blood glucose readings, individual ideal Hgb A1c goals and hypoglycemia prevention. We discussed the importance of good blood sugar control to decrease the likelihood of diabetic complications such as nephropathy, neuropathy, limb loss, blindness, coronary artery disease, and death. We discussed the importance of intensive lifestyle modification including diet, exercise and weight loss as the first line treatment for diabetes. Kristina [name] agrees to continue taking metformin 500 mg PO q AM #30 and we will refill for 1 month. Kristina [name] agrees to follow up with our clinic in 2 weeks.  Depression with Emotional Eating Behaviors We discussed behavior modification techniques today to help Kristina [name] deal with her emotional eating and depression. Lamees agrees to increase Wellbutrin SR to 150 mg PO BID #60 with no refills. Kristina [name] agrees to follow up with our clinic in 2 weeks.  Obesity Kristina [name] is currently in the action stage of change. As such, her goal is to continue with weight loss efforts She has agreed to follow the Category 2 plan Kristina [name] has been instructed to work up to a  goal of 150 minutes of combined cardio and strengthening exercise per week for weight loss and overall health benefits. We discussed the following Behavioral Modification Strategies today: increasing lean protein intake, increasing vegetables, work on meal planning and easy cooking plans, dealing with family or coworker sabotage and holiday eating strategies    Kristina [name] has agreed to follow up with our clinic in 2 weeks. She was informed of the importance of frequent follow up visits to maximize her success with intensive lifestyle modifications for her multiple health conditions.   OBESITY BEHAVIORAL INTERVENTION VISIT  Today's visit was # 14   Starting weight: 248 lbs Starting date: 01/21/18 Today's weight : 228 lbs  Today's date: 08/19/2018 Total lbs lost to date: 20 At least 15 minutes were spent on discussing the following behavioral intervention visit.   ASK: We discussed the diagnosis of obesity with Kristina [name] today and Kristina [name] agreed to give [place] permission to discuss obesity behavioral modification therapy today.  ASSESS: Kristina [name] has the diagnosis of obesity and her BMI today is 40.4 Kristina [name] is in the action stage of change   ADVISE: Kristina [name] was educated on the multiple health risks of obesity as well as the benefit of weight loss to improve her health. She was advised of the need for long term treatment and the importance of lifestyle modifications to improve her current health and to decrease her risk of future health problems.  AGREE: Multiple dietary modification options and treatment options were discussed and  Kristina [name] agreed to follow the recommendations documented in the above note.  ARRANGE: Kristina [name] was educated on the importance of frequent visits to treat obesity as outlined per CMS and USPSTF guidelines and agreed to schedule her next follow up appointment today.  I, Trixie Dredge, am acting as transcriptionist for Ilene Qua, MD  I have reviewed the above  documentation for accuracy and completeness, and I agree with the above. - Ilene Qua, MD

## 2018-08-25 ENCOUNTER — Other Ambulatory Visit (INDEPENDENT_AMBULATORY_CARE_PROVIDER_SITE_OTHER): Payer: Self-pay | Admitting: Family Medicine

## 2018-08-25 DIAGNOSIS — F3289 Other specified depressive episodes: Secondary | ICD-10-CM

## 2018-08-31 ENCOUNTER — Other Ambulatory Visit (INDEPENDENT_AMBULATORY_CARE_PROVIDER_SITE_OTHER): Payer: Self-pay | Admitting: Family Medicine

## 2018-08-31 DIAGNOSIS — B379 Candidiasis, unspecified: Secondary | ICD-10-CM

## 2018-09-01 MED ORDER — NYSTATIN 100000 UNIT/GM EX POWD: Freq: Two times a day (BID) | CUTANEOUS | 0 refills | Status: DC

## 2018-09-02 ENCOUNTER — Encounter (INDEPENDENT_AMBULATORY_CARE_PROVIDER_SITE_OTHER): Payer: Self-pay | Admitting: Family Medicine

## 2018-09-02 ENCOUNTER — Ambulatory Visit (INDEPENDENT_AMBULATORY_CARE_PROVIDER_SITE_OTHER): Payer: Medicare Other | Admitting: Family Medicine

## 2018-09-02 VITALS — BP 143/71 | HR 91 | Temp 98.0°F | Ht 63.0 in | Wt 230.0 lb

## 2018-09-02 DIAGNOSIS — B379 Candidiasis, unspecified: Secondary | ICD-10-CM

## 2018-09-02 DIAGNOSIS — E038 Other specified hypothyroidism: Secondary | ICD-10-CM

## 2018-09-02 DIAGNOSIS — Z6841 Body Mass Index (BMI) 40.0 and over, adult: Secondary | ICD-10-CM

## 2018-09-02 DIAGNOSIS — E559 Vitamin D deficiency, unspecified: Secondary | ICD-10-CM

## 2018-09-02 MED ORDER — LEVOTHYROXINE SODIUM 125 MCG PO TABS: 125.0000 ug | ORAL_TABLET | Freq: Every day | ORAL | 0 refills | Status: DC

## 2018-09-03 NOTE — Progress Notes (Signed)
Office: [phone]  /  Fax: [phone]   HPI:   Chief Complaint: OBESITY Kristina [name] is here to discuss her progress with her obesity treatment plan. She is on the Category 2 plan and is following her eating plan approximately 30 % of the time. She states she is exercising 0 minutes 0 times per week. Kristina [name] has had significant stress with her boyfriend having a heart attack last week and being in the hospital, without many food choices at [facility].  Her weight is 230 lb (104.3 kg) today and has gained 2 pounds since her last visit. She has lost 18 lbs since starting treatment with Kristina [name].  Candidiasis [name] is still experiencing skin breakdown under pannus. She has tried nystatin ointment previously.  Hypothyroidism [name] has a diagnosis of hypothyroidism. She is on levothyroxine and last TSH within normal limits. She denies hot or cold intolerance or palpitations, but does admit to ongoing fatigue.  Vitamin D Deficiency [name] has a diagnosis of vitamin D deficiency. She is currently taking prescription Vit D. She notes fatigue and denies nausea, vomiting or muscle weakness.  ASSESSMENT AND PLAN:  Candidiasis  Other specified hypothyroidism - Plan: levothyroxine (SYNTHROID, LEVOTHROID) 125 MCG tablet  Vitamin D deficiency  Class 3 severe obesity with serious comorbidity and body mass index (BMI) of 40.0 to 44.9 in adult, unspecified obesity type (HCC)  PLAN:  Candidiasis We will refill Nystatin powder, apply topically BID #47 bottle with no refills. Eulene agrees to follow up with our clinic in 2 weeks.  Hypothyroidism Kristina [name] was informed of the importance of good thyroid control to help with weight loss efforts. She was also informed that supertheraputic thyroid levels are dangerous and will not improve weight loss results. Kristina [name] agrees to continue taking levothyroxine 125 mcg PO q AM #30 and we will refill for 1 month. [name] agrees to follow up with our clinic in 2  weeks.  Vitamin D Deficiency Kristina [name] was informed that low vitamin D levels contributes to fatigue and are associated with obesity, breast, and colon cancer. Kristina [name] agrees to continue taking prescription Vit D @50 ,000 IU every week and will follow up for routine testing of vitamin D, at least 2-3 times per year. She was informed of the risk of over-replacement of vitamin D and agrees to not increase her dose unless she discusses this with Kristina [name] first. Kristina [name] agrees to follow up with our clinic in 2 weeks.  Obesity [name] is currently in the action stage of change. As such, her goal is to continue with weight loss efforts She has agreed to follow the Category 2 plan [name] has been instructed to work up to a goal of 150 minutes of combined cardio and strengthening exercise per week for weight loss and overall health benefits. We discussed the following Behavioral Modification Strategies today: increasing lean protein intake, work on meal planning and easy cooking plans, holiday eating strategies, emotional eating strategies, better snacking choices, and planning for success   Kristina [name] has agreed to follow up with our clinic in 2 weeks. She was informed of the importance of frequent follow up visits to maximize her success with intensive lifestyle modifications for her multiple health conditions.  ALLERGIES: Allergies  Allergen Reactions  . Ampicillin Anaphylaxis    Has patient had a PCN reaction causing immediate rash, facial/tongue/throat swelling, SOB or lightheadedness with hypotension: Yes Has patient had a PCN reaction causing severe rash involving mucus membranes or skin necrosis: Yes Has patient had a PCN reaction that required  hospitalization Yes Has patient had a PCN reaction occurring within the last 10 years: No If all of the above answers are "NO", then may proceed with Cephalosporin use.   . Codeine Hives and Shortness Of Breath  . Sulfonamide Derivatives Itching and Rash     MEDICATIONS: Current Outpatient Medications on File Prior to Visit  Medication Sig Dispense Refill  . buPROPion (WELLBUTRIN SR) 150 MG 12 hr tablet Take 1 tablet (150 mg total) by mouth 2 (two) times daily. 60 tablet 0  . dexlansoprazole (DEXILANT) 60 MG capsule Take 1 capsule (60 mg total) by mouth daily before breakfast. 90 capsule 3  . metFORMIN (GLUCOPHAGE) 500 MG tablet Take 1 tablet (500 mg total) by mouth daily with breakfast. 30 tablet 0  . Naltrexone-buPROPion HCl ER (CONTRAVE) 8-90 MG TB12 Take two tablets in the morning and one tab in the evening for 1 week and then increase to two tablets twice daily 60 tablet 0  . nystatin (MYCOSTATIN/NYSTOP) powder Apply topically 2 (two) times daily. 15 g 0  . nystatin-triamcinolone ointment (MYCOLOG) Apply 1 application topically 2 (two) times daily. 30 g 1  . Vitamin D, Ergocalciferol, (DRISDOL) 1.25 MG (50000 UT) CAPS capsule Take 1 capsule (50,000 Units total) by mouth every 7 (seven) days. 4 capsule 0   Current Facility-Administered Medications on File Prior to Visit  Medication Dose Route Frequency Provider Last Rate Last Dose  . 0.9 %  sodium chloride infusion  500 mL Intravenous Once [name], MD        PAST MEDICAL HISTORY: Past Medical History:  Diagnosis Date  . Allergic rhinitis   . Anxiety   . Asthma   . Bulging disc   . Cancer (HCC)    cervical cancer   . Chronic back pain   . Chronic cough   . Depression   . Diabetes mellitus    diet control only  . Diverticulosis   . Dyspnea    chronic  . Family history of adverse reaction to anesthesia    pts daughter had reaction to profolol - difficulty with breathing   . GERD (gastroesophageal reflux disease)   . High cholesterol   . History of bronchitis   . Hx of adenomatous and sessile serrated colonic polyps   . Hypertension   . Hypothyroidism   . Lumbosacral spondylosis without myelopathy    history bulging disc-chronic pain(level 3 to 8).  . Normal  cardiac stress test 02/2015   with normal EF  . Numbness    feet bilat comes and goes   . Obesity   . Obstructive sleep apnea    uses c-pap at night  . Palpitations   . Pneumonia    last episode 2012  . PONV (postoperative nausea and vomiting)   . Prediabetes   . Restless leg   . Vitamin D deficiency   . Vocal cord dysfunction     PAST SURGICAL HISTORY: Past Surgical History:  Procedure Laterality Date  . ABDOMINAL HYSTERECTOMY  1996  . CHOLECYSTECTOMY  1994  . COLONOSCOPY WITH PROPOFOL N/A 03/10/2015   Procedure: COLONOSCOPY WITH PROPOFOL;  Surgeon: [name], MD;  Location: [facility];  Service: Endoscopy;  Laterality: N/A;  . ESOPHAGOGASTRODUODENOSCOPY N/A 10/27/2013   Procedure: ESOPHAGOGASTRODUODENOSCOPY (EGD);  Surgeon: [name], MD;  Location: [facility];  Service: Endoscopy;  Laterality: N/A;  . KNEE ARTHROSCOPY Right   . LAPAROSCOPIC GASTRIC SLEEVE RESECTION WITH HIATAL HERNIA REPAIR N/A 09/19/2015   Procedure: LAPAROSCOPIC GASTRIC  SLEEVE RESECTION WITH HIATAL HERNIA REPAIR;  Surgeon: [name], MD;  Location: [facility];  Service: General;  Laterality: N/A;  . NOSE SURGERY  2004  . ovaries removed    . TUBAL LIGATION    . UPPER GI ENDOSCOPY  09/19/2015   Procedure: UPPER GI ENDOSCOPY;  Surgeon: [name], MD;  Location: [facility];  Service: General;;    SOCIAL HISTORY: Social History   Tobacco Use  . Smoking status: Never Smoker  . Smokeless tobacco: Never Used  Substance Use Topics  . Alcohol use: No  . Drug use: No    FAMILY HISTORY: Family History  Problem Relation Age of Onset  . Emphysema Mother   . Allergies Mother   . Heart disease Mother   . Asthma Mother   . Ovarian cancer Mother   . Hypertension Mother   . Diabetes Mother   . High Cholesterol Mother   . Stroke Mother   . Thyroid disease Mother   . Depression Mother   . Anxiety disorder Mother   . Heart disease Father        deceased at age 6 from heart attack  . Sudden death  Father   . High Cholesterol Father   . High blood pressure Father   . Stroke Maternal Grandmother   . Heart disease Maternal Grandmother   . Cancer Maternal Grandfather   . Heart disease Paternal Grandmother   . Multiple sclerosis Paternal Grandmother   . Heart attack Paternal Grandfather   . Thyroid disease Brother        died at 18  . Heart attack Brother        died at 60  . COPD Brother   . Colon cancer Brother 49  . Hyperthyroidism Sister   . Hypertension Brother   . Hypertension Daughter   . Allergies Child   . Asthma Son   . Asthma Brother   . Asthma Brother   . Lung cancer Maternal Aunt   . COPD Sister        died at 61  . Sudden death Sister   . Asthma Son     ROS: Review of Systems  Constitutional: Positive for malaise/fatigue. Negative for weight loss.  Cardiovascular: Negative for palpitations.  Gastrointestinal: Negative for nausea and vomiting.  Musculoskeletal:       Negative muscle weakness  Skin: Positive for rash (pannus).  Endo/Heme/Allergies:       Negative hot/cold intolerance    PHYSICAL EXAM: Blood pressure (!) 143/71, pulse 91, temperature 98 F (36.7 C), temperature source Oral, height 5\' 3"  (1.6 m), weight 230 lb (104.3 kg), SpO2 97 %. Body mass index is 40.74 kg/m. Physical Exam Vitals signs reviewed.  Constitutional:      Appearance: Normal appearance. She is obese.  Cardiovascular:     Rate and Rhythm: Normal rate.     Pulses: Normal pulses.  Pulmonary:     Effort: Pulmonary effort is normal.  Musculoskeletal: Normal range of motion.  Skin:    General: Skin is warm and dry.  Neurological:     Mental Status: She is alert and oriented to person, place, and time.  Psychiatric:        Mood and Affect: Mood normal.        Behavior: Behavior normal.     RECENT LABS AND TESTS: BMET    Component Value Date/Time   NA 140 02/05/2018 1055   K 4.5 02/05/2018 1055   CL 105 02/05/2018 1055  CO2 22 02/05/2018 1055   GLUCOSE 91  02/05/2018 1055   GLUCOSE 84 11/10/2015 0527   BUN 10 02/05/2018 1055   CREATININE 0.62 02/05/2018 1055   CREATININE 0.88 02/02/2015 1458   CREATININE 0.88 02/02/2015 1458   CALCIUM 9.3 02/05/2018 1055   GFRNONAA 108 02/05/2018 1055   GFRAA 125 02/05/2018 1055   Lab Results  Component Value Date   HGBA1C 5.5 07/16/2018   HGBA1C 5.4 02/05/2018   HGBA1C 6.3 (H) 09/14/2015   HGBA1C (H) 02/12/2011    6.2 (NOTE)                                                                       According to the ADA Clinical Practice Recommendations for 2011, when HbA1c is used as a screening test:   >=6.5%   Diagnostic of Diabetes Mellitus           (if abnormal result  is confirmed)  5.7-6.4%   Increased risk of developing Diabetes Mellitus  References:Diagnosis and Classification of Diabetes Mellitus,Diabetes URKY,7062,37(SEGBT 1):S62-S69 and Standards of Medical Care in         Diabetes - 2011,Diabetes Care,2011,34  (Suppl 1):S11-S61.   HGBA1C 6.2 (H) 02/02/2008   Lab Results  Component Value Date   INSULIN 6.9 07/16/2018   INSULIN 11.0 01/21/2018   CBC    Component Value Date/Time   WBC 5.8 02/05/2018 1055   WBC 3.8 (L) 11/10/2015 0527   RBC 4.43 02/05/2018 1055   RBC 4.46 11/10/2015 0527   HGB 12.4 02/05/2018 1055   HCT 37.3 02/05/2018 1055   PLT 121 (L) 11/10/2015 0527   MCV 84 02/05/2018 1055   MCH 28.0 02/05/2018 1055   MCH 27.4 11/10/2015 0527   MCHC 33.2 02/05/2018 1055   MCHC 31.3 11/10/2015 0527   RDW 13.9 02/05/2018 1055   LYMPHSABS 2.6 02/05/2018 1055   MONOABS 0.8 11/09/2015 0515   EOSABS 0.1 02/05/2018 1055   BASOSABS 0.0 02/05/2018 1055   Iron/TIBC/Ferritin/ %Sat No results found for: IRON, TIBC, FERRITIN, IRONPCTSAT Lipid Panel     Component Value Date/Time   CHOL 233 (H) 02/05/2018 1055   TRIG 157 (H) 02/05/2018 1055   HDL 51 02/05/2018 1055   CHOLHDL 4.5 02/12/2008 0540   VLDL 25 02/12/2008 0540   LDLCALC 151 (H) 02/05/2018 1055   Hepatic Function Panel      Component Value Date/Time   PROT 6.8 02/05/2018 1055   ALBUMIN 4.2 02/05/2018 1055   AST 16 02/05/2018 1055   ALT 11 02/05/2018 1055   ALKPHOS 110 02/05/2018 1055   BILITOT 0.3 02/05/2018 1055      Component Value Date/Time   TSH 0.481 07/16/2018 1259   TSH 0.593 05/12/2018 1451   TSH 0.329 (L) 03/16/2018 1618      OBESITY BEHAVIORAL INTERVENTION VISIT  Today's visit was # 15   Starting weight: 248 lbs Starting date: 01/21/18 Today's weight : 230 lbs  Today's date: 09/02/2018 Total lbs lost to date: 18 At least 15 minutes were spent on discussing the following behavioral intervention visit.   ASK: We discussed the diagnosis of obesity with Princess Perna today and Wrenly agreed to give Kristina [name] permission to discuss obesity behavioral modification therapy today.  ASSESS:  Nemiah has the diagnosis of obesity and her BMI today is 40.75 Jhaniya is in the action stage of change   ADVISE: Iyla was educated on the multiple health risks of obesity as well as the benefit of weight loss to improve her health. She was advised of the need for long term treatment and the importance of lifestyle modifications to improve her current health and to decrease her risk of future health problems.  AGREE: Multiple dietary modification options and treatment options were discussed and  Aarya agreed to follow the recommendations documented in the above note.  ARRANGE: Tyshae was educated on the importance of frequent visits to treat obesity as outlined per CMS and USPSTF guidelines and agreed to schedule her next follow up appointment today.  I, Trixie Dredge, am acting as transcriptionist for Ilene Qua, MD  I have reviewed the above documentation for accuracy and completeness, and I agree with the above. - Ilene Qua, MD

## 2018-09-08 ENCOUNTER — Other Ambulatory Visit (INDEPENDENT_AMBULATORY_CARE_PROVIDER_SITE_OTHER): Payer: Self-pay | Admitting: Family Medicine

## 2018-09-08 DIAGNOSIS — E559 Vitamin D deficiency, unspecified: Secondary | ICD-10-CM

## 2018-09-08 DIAGNOSIS — E1165 Type 2 diabetes mellitus with hyperglycemia: Secondary | ICD-10-CM

## 2018-09-08 DIAGNOSIS — Z794 Long term (current) use of insulin: Secondary | ICD-10-CM

## 2018-09-14 MED ORDER — METFORMIN HCL 500 MG PO TABS: 500.0000 mg | ORAL_TABLET | Freq: Every day | ORAL | 0 refills | Status: DC

## 2018-09-14 MED ORDER — VITAMIN D (ERGOCALCIFEROL) 1.25 MG (50000 UNIT) PO CAPS: 50000.0000 [IU] | ORAL_CAPSULE | ORAL | 0 refills | Status: DC

## 2018-09-17 ENCOUNTER — Telehealth: Payer: Self-pay | Admitting: Plastic Surgery

## 2018-09-17 NOTE — Telephone Encounter (Signed)
Patient called requesting cosmetic quote for services. Informed patient that [facility] would be able to provide a quote by Monday for cosmetic procedures. Patient awaiting call back.

## 2018-09-23 ENCOUNTER — Ambulatory Visit (INDEPENDENT_AMBULATORY_CARE_PROVIDER_SITE_OTHER): Payer: Medicare Other | Admitting: Family Medicine

## 2018-09-23 VITALS — BP 124/83 | HR 95 | Temp 97.8°F | Ht 63.0 in | Wt 232.0 lb

## 2018-09-23 DIAGNOSIS — F3289 Other specified depressive episodes: Secondary | ICD-10-CM | POA: Diagnosis not present

## 2018-09-23 DIAGNOSIS — R7303 Prediabetes: Secondary | ICD-10-CM

## 2018-09-23 DIAGNOSIS — Z6841 Body Mass Index (BMI) 40.0 and over, adult: Secondary | ICD-10-CM | POA: Diagnosis not present

## 2018-09-23 DIAGNOSIS — E038 Other specified hypothyroidism: Secondary | ICD-10-CM | POA: Diagnosis not present

## 2018-09-23 MED ORDER — LEVOTHYROXINE SODIUM 125 MCG PO TABS: 125.0000 ug | ORAL_TABLET | Freq: Every day | ORAL | 0 refills | Status: DC

## 2018-09-23 MED ORDER — LIRAGLUTIDE 18 MG/3ML ~~LOC~~ SOPN: 0.6000 mg | PEN_INJECTOR | Freq: Every day | SUBCUTANEOUS | 0 refills | Status: DC

## 2018-09-23 MED ORDER — BUPROPION HCL ER (SR) 150 MG PO TB12: 150.0000 mg | ORAL_TABLET | Freq: Two times a day (BID) | ORAL | 0 refills | Status: DC

## 2018-09-24 NOTE — Progress Notes (Signed)
Office: [phone]  /  Fax: [phone]   HPI:   Chief Complaint: OBESITY Kristina [name] is here to discuss her progress with her obesity treatment plan. She is on the Category 2 plan and is following her eating plan approximately 70 % of the time. She states she is exercising 0 minutes 0 times per week. Kristina [name] is getting back on track with Category 2 and will likely be getting panniculectomy in the near future. She plans to start going to the gym tomorrow.  Her weight is 232 lb (105.2 kg) today and has gained 2 pounds since her last visit. She has lost 16 lbs since starting treatment with [place].  Pre-Diabetes Kristina [name] has a diagnosis of pre-diabetes based on her elevated Hgb A1c and was informed this puts her at greater risk of developing diabetes. She denies GI side effects of metformin and notes carbohydrate cravings. She has minimal improvement in Hgb A1c on metformin alone. She continues to work on diet and exercise to decrease risk of diabetes. She denies hypoglycemia.  Hypothyroidism Kristina [name] has a diagnosis of hypothyroidism. She is on levothyroxine. She denies hot or cold intolerance or palpitations, but does admit to ongoing fatigue.  Depression with emotional eating behaviors Kristina [name] is struggling with emotional eating and using food for comfort to the extent that it is negatively impacting her health. She often snacks when she is not hungry. Kristina [name] sometimes feels she is out of control and then feels guilty that she made poor food choices. She has been working on behavior modification techniques to help reduce her emotional eating and has been somewhat successful. Her blood pressure is controlled and she shows no sign of suicidal or homicidal ideations.  Depression screen [facility] 2/9 01/21/2018 04/19/2016 01/24/2016 11/20/2015 10/03/2015  Decreased Interest 3 0 0 0 0  Down, Depressed, Hopeless 3 0 0 0 0  PHQ - 2 Score 6 0 0 0 0  Altered sleeping 2 - - - -  Tired, decreased energy 3 - - - -  Change in  appetite 3 - - - -  Feeling bad or failure about yourself  3 - - - -  Trouble concentrating 1 - - - -  Moving slowly or fidgety/restless 1 - - - -  Suicidal thoughts 0 - - - -  PHQ-9 Score 19 - - - -  Difficult doing work/chores Very difficult - - - -    ASSESSMENT AND PLAN:  Prediabetes - Plan: liraglutide (VICTOZA) 18 MG/3ML SOPN  Other specified hypothyroidism - Plan: levothyroxine (SYNTHROID, LEVOTHROID) 125 MCG tablet  Other depression - with emotional eating - Plan: buPROPion (WELLBUTRIN SR) 150 MG 12 hr tablet  Class 3 severe obesity with serious comorbidity and body mass index (BMI) of 40.0 to 44.9 in adult, unspecified obesity type (HCC)  PLAN:  Pre-Diabetes Kristina [name] will continue to work on weight loss, exercise, and decreasing simple carbohydrates in her diet to help decrease the risk of diabetes. We dicussed metformin including benefits and risks. She was informed that eating too many simple carbohydrates or too many calories at one sitting increases the likelihood of GI side effects. Kristina [name] agrees to start Victoza 0.6 mg SubQ daily #2 pens with no refills. [name] agrees to follow up with our clinic in 2 weeks as directed to monitor her progress.  Hypothyroidism Kristina [name] was informed of the importance of good thyroid control to help with weight loss efforts. She was also informed that supertheraputic thyroid levels are dangerous and will not improve weight loss  results. Kristina [name] agrees to continue taking levothyroxine 125 mcg PO q AM #30 and we will refill for 1 month. Kristina [name] agrees to follow up with our clinic in 2 weeks.  Depression with Emotional Eating Behaviors We discussed behavior modification techniques today to help [name] deal with her emotional eating and depression. Kristina [name] agrees to continue bupropion 150 mg q daily #30 and we will refill for 1 month. Kristina [name] agrees to follow up with our clinic in 2 weeks.  Obesity Kristina [name] is currently in the action stage of change. As such, her  goal is to continue with weight loss efforts She has agreed to follow the Category 2 plan Kristina [name] has been instructed to work up to a goal of 150 minutes of combined cardio and strengthening exercise per week for weight loss and overall health benefits. We discussed the following Behavioral Modification Strategies today: increasing lean protein intake, increasing vegetables and work on meal planning and easy cooking plans, and planning for success   Kristina [name] has agreed to follow up with our clinic in 2 weeks. She was informed of the importance of frequent follow up visits to maximize her success with intensive lifestyle modifications for her multiple health conditions.  ALLERGIES: Allergies  Allergen Reactions  . Ampicillin Anaphylaxis    Has patient had a PCN reaction causing immediate rash, facial/tongue/throat swelling, SOB or lightheadedness with hypotension: Yes Has patient had a PCN reaction causing severe rash involving mucus membranes or skin necrosis: Yes Has patient had a PCN reaction that required hospitalization Yes Has patient had a PCN reaction occurring within the last 10 years: No If all of the above answers are "NO", then may proceed with Cephalosporin use.   . Codeine Hives and Shortness Of Breath  . Sulfonamide Derivatives Itching and Rash    MEDICATIONS: Current Outpatient Medications on File Prior to Visit  Medication Sig Dispense Refill  . dexlansoprazole (DEXILANT) 60 MG capsule Take 1 capsule (60 mg total) by mouth daily before breakfast. 90 capsule 3  . metFORMIN (GLUCOPHAGE) 500 MG tablet Take 1 tablet (500 mg total) by mouth daily with breakfast. 14 tablet 0  . Naltrexone-buPROPion HCl ER (CONTRAVE) 8-90 MG TB12 Take two tablets in the morning and one tab in the evening for 1 week and then increase to two tablets twice daily 60 tablet 0  . nystatin (MYCOSTATIN/NYSTOP) powder Apply topically 2 (two) times daily. 15 g 0  . nystatin-triamcinolone ointment (MYCOLOG)  Apply 1 application topically 2 (two) times daily. 30 g 1  . Vitamin D, Ergocalciferol, (DRISDOL) 1.25 MG (50000 UT) CAPS capsule Take 1 capsule (50,000 Units total) by mouth every 7 (seven) days. 2 capsule 0   Current Facility-Administered Medications on File Prior to Visit  Medication Dose Route Frequency Provider Last Rate Last Dose  . 0.9 %  sodium chloride infusion  500 mL Intravenous Once [name], MD        PAST MEDICAL HISTORY: Past Medical History:  Diagnosis Date  . Allergic rhinitis   . Anxiety   . Asthma   . Bulging disc   . Cancer (HCC)    cervical cancer   . Chronic back pain   . Chronic cough   . Depression   . Diabetes mellitus    diet control only  . Diverticulosis   . Dyspnea    chronic  . Family history of adverse reaction to anesthesia    pts daughter had reaction to profolol - difficulty with breathing   . GERD (  gastroesophageal reflux disease)   . High cholesterol   . History of bronchitis   . Hx of adenomatous and sessile serrated colonic polyps   . Hypertension   . Hypothyroidism   . Lumbosacral spondylosis without myelopathy    history bulging disc-chronic pain(level 3 to 8).  . Normal cardiac stress test 02/2015   with normal EF  . Numbness    feet bilat comes and goes   . Obesity   . Obstructive sleep apnea    uses c-pap at night  . Palpitations   . Pneumonia    last episode 2012  . PONV (postoperative nausea and vomiting)   . Prediabetes   . Restless leg   . Vitamin D deficiency   . Vocal cord dysfunction     PAST SURGICAL HISTORY: Past Surgical History:  Procedure Laterality Date  . ABDOMINAL HYSTERECTOMY  1996  . CHOLECYSTECTOMY  1994  . COLONOSCOPY WITH PROPOFOL N/A 03/10/2015   Procedure: COLONOSCOPY WITH PROPOFOL;  Surgeon: [name], MD;  Location: [facility];  Service: Endoscopy;  Laterality: N/A;  . ESOPHAGOGASTRODUODENOSCOPY N/A 10/27/2013   Procedure: ESOPHAGOGASTRODUODENOSCOPY (EGD);  Surgeon: [name], MD;  Location: [facility];  Service: Endoscopy;  Laterality: N/A;  . KNEE ARTHROSCOPY Right   . LAPAROSCOPIC GASTRIC SLEEVE RESECTION WITH HIATAL HERNIA REPAIR N/A 09/19/2015   Procedure: LAPAROSCOPIC GASTRIC SLEEVE RESECTION WITH HIATAL HERNIA REPAIR;  Surgeon: [name], MD;  Location: [facility];  Service: General;  Laterality: N/A;  . NOSE SURGERY  2004  . ovaries removed    . TUBAL LIGATION    . UPPER GI ENDOSCOPY  09/19/2015   Procedure: UPPER GI ENDOSCOPY;  Surgeon: [name], MD;  Location: [facility];  Service: General;;    SOCIAL HISTORY: Social History   Tobacco Use  . Smoking status: Never Smoker  . Smokeless tobacco: Never Used  Substance Use Topics  . Alcohol use: No  . Drug use: No    FAMILY HISTORY: Family History  Problem Relation Age of Onset  . Emphysema Mother   . Allergies Mother   . Heart disease Mother   . Asthma Mother   . Ovarian cancer Mother   . Hypertension Mother   . Diabetes Mother   . High Cholesterol Mother   . Stroke Mother   . Thyroid disease Mother   . Depression Mother   . Anxiety disorder Mother   . Heart disease Father        deceased at age 38 from heart attack  . Sudden death Father   . High Cholesterol Father   . High blood pressure Father   . Stroke Maternal Grandmother   . Heart disease Maternal Grandmother   . Cancer Maternal Grandfather   . Heart disease Paternal Grandmother   . Multiple sclerosis Paternal Grandmother   . Heart attack Paternal Grandfather   . Thyroid disease Brother        died at 11  . Heart attack Brother        died at 80  . COPD Brother   . Colon cancer Brother 67  . Hyperthyroidism Sister   . Hypertension Brother   . Hypertension Daughter   . Allergies Child   . Asthma Son   . Asthma Brother   . Asthma Brother   . Lung cancer Maternal Aunt   . COPD Sister        died at 21  . Sudden death Sister   . Asthma Son  ROS: Review of Systems  Constitutional: Positive for  malaise/fatigue. Negative for weight loss.  Cardiovascular: Negative for palpitations.  Endo/Heme/Allergies:       Negative hypoglycemia Negative hot/cold intolerance  Psychiatric/Behavioral: Positive for depression. Negative for suicidal ideas.    PHYSICAL EXAM: Blood pressure 124/83, pulse 95, temperature 97.8 F (36.6 C), temperature source Oral, height 5\' 3"  (1.6 m), weight 232 lb (105.2 kg), SpO2 96 %. Body mass index is 41.1 kg/m. Physical Exam Vitals signs reviewed.  Constitutional:      Appearance: Normal appearance. She is obese.  Cardiovascular:     Rate and Rhythm: Normal rate.     Pulses: Normal pulses.  Pulmonary:     Effort: Pulmonary effort is normal.     Breath sounds: Normal breath sounds.  Musculoskeletal: Normal range of motion.  Skin:    General: Skin is warm and dry.  Neurological:     Mental Status: She is alert and oriented to person, place, and time.  Psychiatric:        Mood and Affect: Mood normal.        Behavior: Behavior normal.     RECENT LABS AND TESTS: BMET    Component Value Date/Time   NA 140 02/05/2018 1055   K 4.5 02/05/2018 1055   CL 105 02/05/2018 1055   CO2 22 02/05/2018 1055   GLUCOSE 91 02/05/2018 1055   GLUCOSE 84 11/10/2015 0527   BUN 10 02/05/2018 1055   CREATININE 0.62 02/05/2018 1055   CREATININE 0.88 02/02/2015 1458   CREATININE 0.88 02/02/2015 1458   CALCIUM 9.3 02/05/2018 1055   GFRNONAA 108 02/05/2018 1055   GFRAA 125 02/05/2018 1055   Lab Results  Component Value Date   HGBA1C 5.5 07/16/2018   HGBA1C 5.4 02/05/2018   HGBA1C 6.3 (H) 09/14/2015   HGBA1C (H) 02/12/2011    6.2 (NOTE)                                                                       According to the ADA Clinical Practice Recommendations for 2011, when HbA1c is used as a screening test:   >=6.5%   Diagnostic of Diabetes Mellitus           (if abnormal result  is confirmed)  5.7-6.4%   Increased risk of developing Diabetes Mellitus   References:Diagnosis and Classification of Diabetes Mellitus,Diabetes DVVO,1607,37(TGGYI 1):S62-S69 and Standards of Medical Care in         Diabetes - 2011,Diabetes Care,2011,34  (Suppl 1):S11-S61.   HGBA1C 6.2 (H) 02/02/2008   Lab Results  Component Value Date   INSULIN 6.9 07/16/2018   INSULIN 11.0 01/21/2018   CBC    Component Value Date/Time   WBC 5.8 02/05/2018 1055   WBC 3.8 (L) 11/10/2015 0527   RBC 4.43 02/05/2018 1055   RBC 4.46 11/10/2015 0527   HGB 12.4 02/05/2018 1055   HCT 37.3 02/05/2018 1055   PLT 121 (L) 11/10/2015 0527   MCV 84 02/05/2018 1055   MCH 28.0 02/05/2018 1055   MCH 27.4 11/10/2015 0527   MCHC 33.2 02/05/2018 1055   MCHC 31.3 11/10/2015 0527   RDW 13.9 02/05/2018 1055   LYMPHSABS 2.6 02/05/2018 1055   MONOABS 0.8 11/09/2015 0515  EOSABS 0.1 02/05/2018 1055   BASOSABS 0.0 02/05/2018 1055   Iron/TIBC/Ferritin/ %Sat No results found for: IRON, TIBC, FERRITIN, IRONPCTSAT Lipid Panel     Component Value Date/Time   CHOL 233 (H) 02/05/2018 1055   TRIG 157 (H) 02/05/2018 1055   HDL 51 02/05/2018 1055   CHOLHDL 4.5 02/12/2008 0540   VLDL 25 02/12/2008 0540   LDLCALC 151 (H) 02/05/2018 1055   Hepatic Function Panel     Component Value Date/Time   PROT 6.8 02/05/2018 1055   ALBUMIN 4.2 02/05/2018 1055   AST 16 02/05/2018 1055   ALT 11 02/05/2018 1055   ALKPHOS 110 02/05/2018 1055   BILITOT 0.3 02/05/2018 1055      Component Value Date/Time   TSH 0.481 07/16/2018 1259   TSH 0.593 05/12/2018 1451   TSH 0.329 (L) 03/16/2018 1618      OBESITY BEHAVIORAL INTERVENTION VISIT  Today's visit was # 16   Starting weight: 248 lbs Starting date: 01/21/18 Today's weight : 232 lbs  Today's date: 09/23/2018 Total lbs lost to date: 16 At least 15 minutes were spent on discussing the following behavioral intervention visit.   ASK: We discussed the diagnosis of obesity with Princess Perna today and Cletis agreed to give [place] permission to discuss  obesity behavioral modification therapy today.  ASSESS: Joselynn has the diagnosis of obesity and her BMI today is 41.11 Aljean is in the action stage of change   ADVISE: Dalayza was educated on the multiple health risks of obesity as well as the benefit of weight loss to improve her health. She was advised of the need for long term treatment and the importance of lifestyle modifications to improve her current health and to decrease her risk of future health problems.  AGREE: Multiple dietary modification options and treatment options were discussed and  Jaylee agreed to follow the recommendations documented in the above note.  ARRANGE: Karilynn was educated on the importance of frequent visits to treat obesity as outlined per CMS and USPSTF guidelines and agreed to schedule her next follow up appointment today.  I, Trixie Dredge, am acting as transcriptionist for Ilene Qua, MD  I have reviewed the above documentation for accuracy and completeness, and I agree with the above. - Ilene Qua, MD
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
## 2018-10-15 ENCOUNTER — Encounter (INDEPENDENT_AMBULATORY_CARE_PROVIDER_SITE_OTHER): Payer: Self-pay | Admitting: Family Medicine

## 2018-10-15 ENCOUNTER — Ambulatory Visit (INDEPENDENT_AMBULATORY_CARE_PROVIDER_SITE_OTHER): Payer: Medicare Other | Admitting: Psychology

## 2018-10-15 ENCOUNTER — Ambulatory Visit (INDEPENDENT_AMBULATORY_CARE_PROVIDER_SITE_OTHER): Payer: Medicare Other | Admitting: Family Medicine

## 2018-10-15 VITALS — BP 137/82 | HR 86 | Temp 98.3°F | Ht 63.0 in | Wt 226.0 lb

## 2018-10-15 DIAGNOSIS — Z6841 Body Mass Index (BMI) 40.0 and over, adult: Secondary | ICD-10-CM | POA: Diagnosis not present

## 2018-10-15 DIAGNOSIS — F3289 Other specified depressive episodes: Secondary | ICD-10-CM

## 2018-10-15 DIAGNOSIS — R7303 Prediabetes: Secondary | ICD-10-CM | POA: Diagnosis not present

## 2018-10-18 ENCOUNTER — Other Ambulatory Visit (INDEPENDENT_AMBULATORY_CARE_PROVIDER_SITE_OTHER): Payer: Self-pay | Admitting: Bariatrics

## 2018-10-18 DIAGNOSIS — Z794 Long term (current) use of insulin: Secondary | ICD-10-CM

## 2018-10-18 DIAGNOSIS — E1165 Type 2 diabetes mellitus with hyperglycemia: Secondary | ICD-10-CM

## 2018-10-18 NOTE — Progress Notes (Signed)
Office: [phone]  /  Fax: [phone]   HPI:   Chief Complaint: OBESITY Kristina [name] is here to discuss her progress with her obesity treatment plan. She is on the Category 2 plan and is following her eating plan approximately 85 % of the time. She states she is doing cardio and strengthening for 50 minutes 2 times per week. Kristina [name] is finding recent addition of Victoza to be helpful. She is having panniculectomy on March 12th, already scheduled. She is bringing lunch to watch her grandchildren. She is experiencing some sabotage from her significant other.  Her weight is 226 lb (102.5 kg) today and has had a weight loss of 6 pounds over a period of 3 weeks since her last visit. She has lost 22 lbs since starting treatment with [place].  Pre-Diabetes Kristina [name] has a diagnosis of pre-diabetes based on her elevated Hgb A1c and was informed this puts her at greater risk of developing diabetes. She is on metformin and Victoza, and she denies GI side effects of the medications. She continues to work on diet and exercise to decrease risk of diabetes. She denies hypoglycemia.  Depression with emotional eating behaviors Kristina [name]'s blood pressure is controlled today. She is seeing Dr. Mallie Mussel. Kristina [name] struggles with emotional eating and using food for comfort to the extent that it is negatively impacting her health. She often snacks when she is not hungry. Kristina [name] sometimes feels she is out of control and then feels guilty that she made poor food choices. She has been working on behavior modification techniques to help reduce her emotional eating and has been somewhat successful. She shows no sign of suicidal or homicidal ideations.  Depression screen [facility] 2/9 10/15/2018 01/21/2018 04/19/2016 01/24/2016 11/20/2015  Decreased Interest 1 3 0 0 0  Down, Depressed, Hopeless 1 3 0 0 0  PHQ - 2 Score 2 6 0 0 0  Altered sleeping 1 2 - - -  Tired, decreased energy 1 3 - - -  Change in appetite 2 3 - - -  Feeling bad or failure about  yourself  1 3 - - -  Trouble concentrating 1 1 - - -  Moving slowly or fidgety/restless 0 1 - - -  Suicidal thoughts 0 0 - - -  PHQ-9 Score 8 19 - - -  Difficult doing work/chores - Very difficult - - -    ASSESSMENT AND PLAN:  Prediabetes  Other depression - with emotional eating   Class 3 severe obesity with serious comorbidity and body mass index (BMI) of 40.0 to 44.9 in adult, unspecified obesity type (HCC)  PLAN:  Pre-Diabetes Ishita will continue to work on weight loss, exercise, and decreasing simple carbohydrates in her diet to help decrease the risk of diabetes. We dicussed metformin including benefits and risks. She was informed that eating too many simple carbohydrates or too many calories at one sitting increases the likelihood of GI side effects. Trinia agrees to continue her current medications at the same doses. Amri agrees to follow up with our clinic in 2 weeks as directed to monitor her progress.  Depression with Emotional Eating Behaviors We discussed behavior modification techniques today to help Kristina [name] deal with her emotional eating and depression. Ranada will follow up with Dr. Mallie Mussel and she will continue taking Wellbutrin. Emmylou agrees to follow up with our clinic in 2 weeks.  I spent > than 50% of the 15 minute visit on counseling as documented in the note.  Obesity Kristina [name] is currently in the  action stage of change. As such, her goal is to continue with weight loss efforts She has agreed to follow the Category 2 plan Kristina [name] has been instructed to work up to a goal of 150 minutes of combined cardio and strengthening exercise per week for weight loss and overall health benefits. We discussed the following Behavioral Modification Strategies today: increasing lean protein intake, increasing vegetables, work on meal planning and easy cooking plans, and planning for success   Kristina [name] has agreed to follow up with our clinic in 2 weeks. She was informed of the importance  of frequent follow up visits to maximize her success with intensive lifestyle modifications for her multiple health conditions.  ALLERGIES: Allergies  Allergen Reactions  . Ampicillin Anaphylaxis    Has patient had a PCN reaction causing immediate rash, facial/tongue/throat swelling, SOB or lightheadedness with hypotension: Yes Has patient had a PCN reaction causing severe rash involving mucus membranes or skin necrosis: Yes Has patient had a PCN reaction that required hospitalization Yes Has patient had a PCN reaction occurring within the last 10 years: No If all of the above answers are "NO", then may proceed with Cephalosporin use.   . Codeine Hives and Shortness Of Breath  . Sulfonamide Derivatives Itching and Rash    MEDICATIONS: Current Outpatient Medications on File Prior to Visit  Medication Sig Dispense Refill  . buPROPion (WELLBUTRIN SR) 150 MG 12 hr tablet Take 1 tablet (150 mg total) by mouth 2 (two) times daily. 60 tablet 0  . dexlansoprazole (DEXILANT) 60 MG capsule Take 1 capsule (60 mg total) by mouth daily before breakfast. 90 capsule 3  . levothyroxine (SYNTHROID, LEVOTHROID) 125 MCG tablet Take 1 tablet (125 mcg total) by mouth daily. 30 tablet 0  . liraglutide (VICTOZA) 18 MG/3ML SOPN Inject 0.1 mLs (0.6 mg total) into the skin daily. 2 pen 0  . metFORMIN (GLUCOPHAGE) 500 MG tablet Take 1 tablet (500 mg total) by mouth daily with breakfast. 14 tablet 0  . Naltrexone-buPROPion HCl ER (CONTRAVE) 8-90 MG TB12 Take two tablets in the morning and one tab in the evening for 1 week and then increase to two tablets twice daily 60 tablet 0  . nystatin (MYCOSTATIN/NYSTOP) powder Apply topically 2 (two) times daily. 15 g 0  . nystatin-triamcinolone ointment (MYCOLOG) Apply 1 application topically 2 (two) times daily. 30 g 1  . Vitamin D, Ergocalciferol, (DRISDOL) 1.25 MG (50000 UT) CAPS capsule Take 1 capsule (50,000 Units total) by mouth every 7 (seven) days. 2 capsule 0    Current Facility-Administered Medications on File Prior to Visit  Medication Dose Route Frequency Provider Last Rate Last Dose  . 0.9 %  sodium chloride infusion  500 mL Intravenous Once [name], MD        PAST MEDICAL HISTORY: Past Medical History:  Diagnosis Date  . Allergic rhinitis   . Anxiety   . Asthma   . Bulging disc   . Cancer (HCC)    cervical cancer   . Chronic back pain   . Chronic cough   . Depression   . Diabetes mellitus    diet control only  . Diverticulosis   . Dyspnea    chronic  . Family history of adverse reaction to anesthesia    pts daughter had reaction to profolol - difficulty with breathing   . GERD (gastroesophageal reflux disease)   . High cholesterol   . History of bronchitis   . Hx of adenomatous and sessile serrated colonic  polyps   . Hypertension   . Hypothyroidism   . Lumbosacral spondylosis without myelopathy    history bulging disc-chronic pain(level 3 to 8).  . Normal cardiac stress test 02/2015   with normal EF  . Numbness    feet bilat comes and goes   . Obesity   . Obstructive sleep apnea    uses c-pap at night  . Palpitations   . Pneumonia    last episode 2012  . PONV (postoperative nausea and vomiting)   . Prediabetes   . Restless leg   . Vitamin D deficiency   . Vocal cord dysfunction     PAST SURGICAL HISTORY: Past Surgical History:  Procedure Laterality Date  . ABDOMINAL HYSTERECTOMY  1996  . CHOLECYSTECTOMY  1994  . COLONOSCOPY WITH PROPOFOL N/A 03/10/2015   Procedure: COLONOSCOPY WITH PROPOFOL;  Surgeon: [name], MD;  Location: [facility];  Service: Endoscopy;  Laterality: N/A;  . ESOPHAGOGASTRODUODENOSCOPY N/A 10/27/2013   Procedure: ESOPHAGOGASTRODUODENOSCOPY (EGD);  Surgeon: [name], MD;  Location: [facility];  Service: Endoscopy;  Laterality: N/A;  . KNEE ARTHROSCOPY Right   . LAPAROSCOPIC GASTRIC SLEEVE RESECTION WITH HIATAL HERNIA REPAIR N/A 09/19/2015   Procedure: LAPAROSCOPIC  GASTRIC SLEEVE RESECTION WITH HIATAL HERNIA REPAIR;  Surgeon: [name], MD;  Location: [facility];  Service: General;  Laterality: N/A;  . NOSE SURGERY  2004  . ovaries removed    . TUBAL LIGATION    . UPPER GI ENDOSCOPY  09/19/2015   Procedure: UPPER GI ENDOSCOPY;  Surgeon: [name], MD;  Location: [facility];  Service: General;;    SOCIAL HISTORY: Social History   Tobacco Use  . Smoking status: Never Smoker  . Smokeless tobacco: Never Used  Substance Use Topics  . Alcohol use: No  . Drug use: No    FAMILY HISTORY: Family History  Problem Relation Age of Onset  . Emphysema Mother   . Allergies Mother   . Heart disease Mother   . Asthma Mother   . Ovarian cancer Mother   . Hypertension Mother   . Diabetes Mother   . High Cholesterol Mother   . Stroke Mother   . Thyroid disease Mother   . Depression Mother   . Anxiety disorder Mother   . Heart disease Father        deceased at age 82 from heart attack  . Sudden death Father   . High Cholesterol Father   . High blood pressure Father   . Stroke Maternal Grandmother   . Heart disease Maternal Grandmother   . Cancer Maternal Grandfather   . Heart disease Paternal Grandmother   . Multiple sclerosis Paternal Grandmother   . Heart attack Paternal Grandfather   . Thyroid disease Brother        died at 10  . Heart attack Brother        died at 30  . COPD Brother   . Colon cancer Brother 40  . Hyperthyroidism Sister   . Hypertension Brother   . Hypertension Daughter   . Allergies Child   . Asthma Son   . Asthma Brother   . Asthma Brother   . Lung cancer Maternal Aunt   . COPD Sister        died at 17  . Sudden death Sister   . Asthma Son     ROS: Review of Systems  Constitutional: Positive for weight loss.  Endo/Heme/Allergies:       Negative hypoglycemia  Psychiatric/Behavioral: Positive for depression. Negative for suicidal ideas.    PHYSICAL EXAM: Blood pressure 137/82, pulse 86, temperature 98.3 F (36.8  C), temperature source Oral, height 5\' 3"  (1.6 m), weight 226 lb (102.5 kg), SpO2 94 %. Body mass index is 40.03 kg/m. Physical Exam Vitals signs reviewed.  Constitutional:      Appearance: Normal appearance. She is obese.  Cardiovascular:     Rate and Rhythm: Normal rate.     Pulses: Normal pulses.  Pulmonary:     Effort: Pulmonary effort is normal.     Breath sounds: Normal breath sounds.  Musculoskeletal: Normal range of motion.  Skin:    General: Skin is warm and dry.  Neurological:     Mental Status: She is alert and oriented to person, place, and time.  Psychiatric:        Mood and Affect: Mood normal.        Behavior: Behavior normal.     RECENT LABS AND TESTS: BMET    Component Value Date/Time   NA 140 02/05/2018 1055   K 4.5 02/05/2018 1055   CL 105 02/05/2018 1055   CO2 22 02/05/2018 1055   GLUCOSE 91 02/05/2018 1055   GLUCOSE 84 11/10/2015 0527   BUN 10 02/05/2018 1055   CREATININE 0.62 02/05/2018 1055   CREATININE 0.88 02/02/2015 1458   CREATININE 0.88 02/02/2015 1458   CALCIUM 9.3 02/05/2018 1055   GFRNONAA 108 02/05/2018 1055   GFRAA 125 02/05/2018 1055   Lab Results  Component Value Date   HGBA1C 5.5 07/16/2018   HGBA1C 5.4 02/05/2018   HGBA1C 6.3 (H) 09/14/2015   HGBA1C (H) 02/12/2011    6.2 (NOTE)                                                                       According to the ADA Clinical Practice Recommendations for 2011, when HbA1c is used as a screening test:   >=6.5%   Diagnostic of Diabetes Mellitus           (if abnormal result  is confirmed)  5.7-6.4%   Increased risk of developing Diabetes Mellitus  References:Diagnosis and Classification of Diabetes Mellitus,Diabetes NUUV,2536,64(QIHKV 1):S62-S69 and Standards of Medical Care in         Diabetes - 2011,Diabetes Care,2011,34  (Suppl 1):S11-S61.   HGBA1C 6.2 (H) 02/02/2008   Lab Results  Component Value Date   INSULIN 6.9 07/16/2018   INSULIN 11.0 01/21/2018   CBC     Component Value Date/Time   WBC 5.8 02/05/2018 1055   WBC 3.8 (L) 11/10/2015 0527   RBC 4.43 02/05/2018 1055   RBC 4.46 11/10/2015 0527   HGB 12.4 02/05/2018 1055   HCT 37.3 02/05/2018 1055   PLT 121 (L) 11/10/2015 0527   MCV 84 02/05/2018 1055   MCH 28.0 02/05/2018 1055   MCH 27.4 11/10/2015 0527   MCHC 33.2 02/05/2018 1055   MCHC 31.3 11/10/2015 0527   RDW 13.9 02/05/2018 1055   LYMPHSABS 2.6 02/05/2018 1055   MONOABS 0.8 11/09/2015 0515   EOSABS 0.1 02/05/2018 1055   BASOSABS 0.0 02/05/2018 1055   Iron/TIBC/Ferritin/ %Sat No results found for: IRON, TIBC, FERRITIN, IRONPCTSAT Lipid Panel     Component Value Date/Time  CHOL 233 (H) 02/05/2018 1055   TRIG 157 (H) 02/05/2018 1055   HDL 51 02/05/2018 1055   CHOLHDL 4.5 02/12/2008 0540   VLDL 25 02/12/2008 0540   LDLCALC 151 (H) 02/05/2018 1055   Hepatic Function Panel     Component Value Date/Time   PROT 6.8 02/05/2018 1055   ALBUMIN 4.2 02/05/2018 1055   AST 16 02/05/2018 1055   ALT 11 02/05/2018 1055   ALKPHOS 110 02/05/2018 1055   BILITOT 0.3 02/05/2018 1055      Component Value Date/Time   TSH 0.481 07/16/2018 1259   TSH 0.593 05/12/2018 1451   TSH 0.329 (L) 03/16/2018 1618      OBESITY BEHAVIORAL INTERVENTION VISIT  Today's visit was # 16   Starting weight: 248 lbs Starting date: 01/21/18 Today's weight : 226 lbs  Today's date: 10/15/2018 Total lbs lost to date: 11    ASK: We discussed the diagnosis of obesity with Princess Perna today and Jaleah agreed to give [place] permission to discuss obesity behavioral modification therapy today.  ASSESS: Malyah has the diagnosis of obesity and her BMI today is 40.04 Meiah is in the action stage of change   ADVISE: Oliviarose was educated on the multiple health risks of obesity as well as the benefit of weight loss to improve her health. She was advised of the need for long term treatment and the importance of lifestyle modifications to improve her current health  and to decrease her risk of future health problems.  AGREE: Multiple dietary modification options and treatment options were discussed and  Jose agreed to follow the recommendations documented in the above note.  ARRANGE: Genny was educated on the importance of frequent visits to treat obesity as outlined per CMS and USPSTF guidelines and agreed to schedule her next follow up appointment today.  I, Trixie Dredge, am acting as transcriptionist for Ilene Qua, MD  I have reviewed the above documentation for accuracy and completeness, and I agree with the above. - Ilene Qua, MD

## 2018-10-23 DIAGNOSIS — J060 Acute laryngopharyngitis: Secondary | ICD-10-CM | POA: Diagnosis not present

## 2018-10-23 DIAGNOSIS — R509 Fever, unspecified: Secondary | ICD-10-CM | POA: Diagnosis not present

## 2018-10-24 ENCOUNTER — Other Ambulatory Visit (INDEPENDENT_AMBULATORY_CARE_PROVIDER_SITE_OTHER): Payer: Self-pay | Admitting: Family Medicine

## 2018-10-24 DIAGNOSIS — E038 Other specified hypothyroidism: Secondary | ICD-10-CM

## 2018-10-25 ENCOUNTER — Other Ambulatory Visit (INDEPENDENT_AMBULATORY_CARE_PROVIDER_SITE_OTHER): Payer: Self-pay | Admitting: Bariatrics

## 2018-10-25 DIAGNOSIS — E559 Vitamin D deficiency, unspecified: Secondary | ICD-10-CM

## 2018-10-25 DIAGNOSIS — Z794 Long term (current) use of insulin: Secondary | ICD-10-CM

## 2018-10-25 DIAGNOSIS — E1165 Type 2 diabetes mellitus with hyperglycemia: Secondary | ICD-10-CM

## 2018-10-26 ENCOUNTER — Other Ambulatory Visit (INDEPENDENT_AMBULATORY_CARE_PROVIDER_SITE_OTHER): Payer: Self-pay | Admitting: Bariatrics

## 2018-10-26 DIAGNOSIS — E1165 Type 2 diabetes mellitus with hyperglycemia: Secondary | ICD-10-CM

## 2018-10-26 DIAGNOSIS — Z794 Long term (current) use of insulin: Secondary | ICD-10-CM

## 2018-10-26 MED ORDER — LEVOTHYROXINE SODIUM 125 MCG PO TABS: 125.0000 ug | ORAL_TABLET | Freq: Every day | ORAL | 0 refills | Status: DC

## 2018-10-29 NOTE — Progress Notes (Signed)
Office: [phone]  /  Fax: [phone]    Date: November 02, 2018   Time Seen: 8:50am Duration: 35 minutes Provider: Glennie Isle, Psy.D. Type of Session: Individual Therapy  Type of Contact: Face-to-face  Session Content: Kristina [name] is a 48 y.o. female presenting for a follow-up appointment to address the previously established treatment goal of decreasing emotional eating. The session was initiated with the administration of the PHQ-9 and GAD-7, as well as a brief check-in. [name] discussed that "80%" of her eating is due to emotional hunger, and she discussed a belief she gained weight in the past two weeks. This provider assisted [name] processing and exploring associated feelings and thoughts. [name] disclosed seeing her son for the first time since his incarceration, and also discussed other ongoing stressors. Psychoeducation regarding triggers for emotional eating was provided. [name] was provided a handout, and encouraged to utilize the handout between now and the next appointment to increase awareness of triggers and frequency. [name] agreed. This provider also discussed behavioral strategies for specific triggers, such as placing the utensil down when conversing to avoid mindless eating. Moreover, psychoeducation regarding pleasurable activities, including its impact on emotional eating was provided. [name] was provided with a handout with various options of pleasurable activities, and was encouraged to engage in different activities between now and the next appointment with this provider. More specifically, she was encouraged to engage in one activity a day and also engage in pleasurable activities when she identifies she is experiencing emotional hunger. [name] agreed. [name] was receptive to today's session as evidenced by openness to sharing, responsiveness to feedback, and willingness to explore triggers for emotional eating.  Mental Status Examination: [name] arrived early for the appointment.  She presented as appropriately dressed and groomed. [name] appeared her stated age and demonstrated adequate orientation to time, place, person, and purpose of the appointment. She also demonstrated appropriate eye contact. No psychomotor abnormalities or behavioral peculiarities noted. Her mood was euthymic with congruent affect. Her thought processes were logical, linear, and goal-directed. No hallucinations, delusions, bizarre thinking or behavior reported or observed. Judgment, insight, and impulse control appeared to be grossly intact. There was no evidence of paraphasias (i.e., errors in speech, gross mispronunciations, and word substitutions), repetition deficits, or disturbances in volume or prosody (i.e., rhythm and intonation). There was no evidence of attention or memory impairments. [name] denied current suicidal and homicidal ideation, plan and intent.   Structured Assessment Results: The Patient Health Questionnaire-9 (PHQ-9) is a self-report measure that assesses symptoms and severity of depression over the course of the last two weeks. [name] obtained a score of 15 suggesting moderately severe depression. [name] finds the endorsed symptoms to be very difficult. Depression screen PHQ 2/9 11/02/2018  Decreased Interest 2  Down, Depressed, Hopeless 1  PHQ - 2 Score 3  Altered sleeping 3  Tired, decreased energy 3  Change in appetite 3  Feeling bad or failure about yourself  1  Trouble concentrating 1  Moving slowly or fidgety/restless 1  Suicidal thoughts 0  PHQ-9 Score 15  Difficult doing work/chores -   The Generalized Anxiety Disorder-7 (GAD-7) is a brief self-report measure that assesses symptoms of anxiety over the course of the last two weeks. [name] obtained a score of 11 suggesting moderate anxiety. GAD 7 : Generalized Anxiety Score 11/02/2018  Nervous, Anxious, on Edge 1  Control/stop worrying 2  Worry too much - different things 2  Trouble relaxing 2  Restless 1  Easily  annoyed or irritable 2  Afraid -  awful might happen 1  Total GAD 7 Score 11  Anxiety Difficulty Somewhat difficult   Interventions:  Administration of PHQ-9 and GAD-7 for symptom monitoring Review of content from the previous session Empathic reflections and validation Processing thoughts and feelings Psychoeducation regarding triggers for emotional eating Psychoeducation regarding pleasurable activities Rapport building Brief chart review  DSM-5 Diagnosis: 311 (F32.8) Other Specified Depressive Disorder, Emotional Eating Behaviors  Treatment Goal & Progress: During the initial appointment with this provider, the following treatment goal was established: decrease emotional eating. Progress is limited, as [name] has just begun treatment with this provider; however, she is receptive to the interaction and interventions and rapport is being established. However, [name] has demonstrated progress in her goal as evidenced by increased awareness of hunger patterns, and willingness to discuss triggers for emotional eating.   Plan: [name] continues to appear able and willing to participate as evidenced by engagement in reciprocal conversation, and asking questions for clarification as appropriate. The next appointment will be scheduled in two weeks. The next session will focus on reviewing triggers for emotional eating and pleasurable activities, and working towards the established treatment goal.

## 2018-11-02 ENCOUNTER — Ambulatory Visit (INDEPENDENT_AMBULATORY_CARE_PROVIDER_SITE_OTHER): Payer: Medicare Other | Admitting: Psychology

## 2018-11-02 ENCOUNTER — Encounter (INDEPENDENT_AMBULATORY_CARE_PROVIDER_SITE_OTHER): Payer: Self-pay | Admitting: Family Medicine

## 2018-11-02 ENCOUNTER — Ambulatory Visit (INDEPENDENT_AMBULATORY_CARE_PROVIDER_SITE_OTHER): Payer: Medicare Other | Admitting: Family Medicine

## 2018-11-02 VITALS — BP 138/87 | HR 78 | Temp 97.5°F | Ht 63.0 in | Wt 232.0 lb

## 2018-11-02 DIAGNOSIS — E038 Other specified hypothyroidism: Secondary | ICD-10-CM

## 2018-11-02 DIAGNOSIS — Z6841 Body Mass Index (BMI) 40.0 and over, adult: Secondary | ICD-10-CM

## 2018-11-02 DIAGNOSIS — E1165 Type 2 diabetes mellitus with hyperglycemia: Secondary | ICD-10-CM

## 2018-11-02 DIAGNOSIS — E559 Vitamin D deficiency, unspecified: Secondary | ICD-10-CM

## 2018-11-02 DIAGNOSIS — F3289 Other specified depressive episodes: Secondary | ICD-10-CM

## 2018-11-02 DIAGNOSIS — L304 Erythema intertrigo: Secondary | ICD-10-CM

## 2018-11-02 DIAGNOSIS — Z794 Long term (current) use of insulin: Secondary | ICD-10-CM

## 2018-11-02 MED ORDER — VITAMIN D (ERGOCALCIFEROL) 1.25 MG (50000 UNIT) PO CAPS: 50000.0000 [IU] | ORAL_CAPSULE | ORAL | 0 refills | Status: DC

## 2018-11-02 MED ORDER — NYSTATIN 100000 UNIT/GM EX POWD: Freq: Two times a day (BID) | CUTANEOUS | 0 refills | Status: DC

## 2018-11-02 MED ORDER — BUPROPION HCL ER (SR) 150 MG PO TB12: 150.0000 mg | ORAL_TABLET | Freq: Two times a day (BID) | ORAL | 0 refills | Status: DC

## 2018-11-02 MED ORDER — LEVOTHYROXINE SODIUM 125 MCG PO TABS: 125.0000 ug | ORAL_TABLET | Freq: Every day | ORAL | 0 refills | Status: DC

## 2018-11-02 MED ORDER — METFORMIN HCL 500 MG PO TABS: 500.0000 mg | ORAL_TABLET | Freq: Every day | ORAL | 0 refills | Status: DC

## 2018-11-02 NOTE — Progress Notes (Signed)
Office: [phone]  /  Fax: [phone]   HPI:   Chief Complaint: OBESITY Kristina [name] is here to discuss her progress with her obesity treatment plan. She is on the Category 2 plan and is following her eating plan approximately 70 % of the time. She states she is exercising 0 minutes 0 times per week. Bryna delayed taking Victoza secondary to GI issues. She wants to go back to calorie counting.  Her weight is 232 lb (105.2 kg) today and has gained 6 pounds since her last visit. She has lost 16 lbs since starting treatment with [place].  Hypothyroidism Kristina [name] has a diagnosis of hypothyroidism. She is on levothyroxine. She denies hot or cold intolerance or palpitations, but does admit to ongoing fatigue.  Vitamin D Deficiency Kristina [name] has a diagnosis of vitamin D deficiency. She is currently taking prescription Vit D. She notes fatigue and denies nausea, vomiting or muscle weakness.  Diabetes II with Hyerglycemia Kristina [name] has a diagnosis of diabetes type II. Kristina [name] notes carbohydrate cravings daily throughout the day. She notes occasional nausea with Victoza. Last A1c was 5.5 on 07/16/18. She has been working on intensive lifestyle modifications including diet, exercise, and weight loss to help control her blood glucose levels.  Intertrigo Kristina [name] has intertrigo and notes improvement with Nystatin.  Depression with emotional eating behaviors Kristina [name] is struggling with emotional eating and using food for comfort to the extent that it is negatively impacting her health. She often snacks when she is not hungry. Kristina [name] sometimes feels she is out of control and then feels guilty that she made poor food choices. She has been working on behavior modification techniques to help reduce her emotional eating and has been somewhat successful. Her blood pressure is controlled today and she shows no sign of suicidal or homicidal ideations.  Depression screen Kristina [name] 2/9 11/02/2018 10/15/2018 01/21/2018 04/19/2016 01/24/2016    Decreased Interest 2 1 3  0 0  Down, Depressed, Hopeless 1 1 3  0 0  PHQ - 2 Score 3 2 6  0 0  Altered sleeping 3 1 2  - -  Tired, decreased energy 3 1 3  - -  Change in appetite 3 2 3  - -  Feeling bad or failure about yourself  1 1 3  - -  Trouble concentrating 1 1 1  - -  Moving slowly or fidgety/restless 1 0 1 - -  Suicidal thoughts 0 0 0 - -  PHQ-9 Score 15 8 19  - -  Difficult doing work/chores - - Very difficult - -    ASSESSMENT AND PLAN:  Other specified hypothyroidism - Plan: levothyroxine (SYNTHROID, LEVOTHROID) 125 MCG tablet  Vitamin D deficiency - Plan: Vitamin D, Ergocalciferol, (DRISDOL) 1.25 MG (50000 UT) CAPS capsule  Type 2 diabetes mellitus with hyperglycemia, with long-term current use of insulin (HCC) - Plan: metFORMIN (GLUCOPHAGE) 500 MG tablet  Intertrigo - Plan: nystatin (MYCOSTATIN/NYSTOP) powder  Other depression - with emotional eating - Plan: buPROPion (WELLBUTRIN SR) 150 MG 12 hr tablet  Class 3 severe obesity with serious comorbidity and body mass index (BMI) of 40.0 to 44.9 in adult, unspecified obesity type (HCC)  PLAN:  Hypothyroidism Kristina [name] was informed of the importance of good thyroid control to help with weight loss efforts. She was also informed that supertheraputic thyroid levels are dangerous and will not improve weight loss results. Jamyia agrees to continue taking levothyroxine 125 mcg PO daily #30 and we will refill for 1 month. Garnette agrees to follow up with our clinic in 2 weeks.  Vitamin D Deficiency Kristina [name] was informed that low vitamin D levels contributes to fatigue and are associated with obesity, breast, and colon cancer. Kristina [name] agrees to continue taking prescription Vit D @50 ,000 IU every week #4 and we will refill for 1 month. She will follow up for routine testing of vitamin D, at least 2-3 times per year. She was informed of the risk of over-replacement of vitamin D and agrees to not increase her dose unless she discusses this with [place]  first. Kristina [name] agrees to follow up with our clinic in 2 weeks.  Diabetes II with Hyperglycemia Kristina [name] has been given extensive diabetes education by myself today including ideal fasting and post-prandial blood glucose readings, individual ideal Hgb A1c goals and hypoglycemia prevention. We discussed the importance of good blood sugar control to decrease the likelihood of diabetic complications such as nephropathy, neuropathy, limb loss, blindness, coronary artery disease, and death. We discussed the importance of intensive lifestyle modification including diet, exercise and weight loss as the first line treatment for diabetes. Kristina [name] agrees to continue to continue taking metformin 500 mg PO daily #30 and we will refill for 1 month. Kristina [name] agrees to follow up with our clinic in 2 weeks.  Intertrigo Kristina [name] agrees to continue Nystatin powder, apply topically BID and we will refill for 1 month. Kristina [name] agrees to follow up with our clinic in 2 weeks.  Depression with Emotional Eating Behaviors We discussed behavior modification techniques today to help Kristina [name] deal with her emotional eating and depression. Kristina [name] agrees to continue taking bupropion SR 150 mg PO BID #60 and we will refill for 1 month. Kristina [name] agrees to follow up with our clinic in 2 weeks.  Obesity Kristina [name] is currently in the action stage of change. As such, her goal is to continue with weight loss efforts She has agreed to keep a food journal with 1150-1350 calories and 85+ grams of protein daily Kristina [name] has been instructed to work up to a goal of 150 minutes of combined cardio and strengthening exercise per week or physical activities for 15-30 minutes for 2 days for weight loss and overall health benefits. We discussed the following Behavioral Modification Strategies today: increasing lean protein intake, increasing vegetables and work on meal planning and easy cooking plans, and planning for success   Kristina [name] has agreed to follow up with our clinic in  2 weeks. She was informed of the importance of frequent follow up visits to maximize her success with intensive lifestyle modifications for her multiple health conditions.  ALLERGIES: Allergies  Allergen Reactions  . Ampicillin Anaphylaxis    Has patient had a PCN reaction causing immediate rash, facial/tongue/throat swelling, SOB or lightheadedness with hypotension: Yes Has patient had a PCN reaction causing severe rash involving mucus membranes or skin necrosis: Yes Has patient had a PCN reaction that required hospitalization Yes Has patient had a PCN reaction occurring within the last 10 years: No If all of the above answers are "NO", then may proceed with Cephalosporin use.   . Codeine Hives and Shortness Of Breath  . Sulfonamide Derivatives Itching and Rash    MEDICATIONS: Current Outpatient Medications on File Prior to Visit  Medication Sig Dispense Refill  . dexlansoprazole (DEXILANT) 60 MG capsule Take 1 capsule (60 mg total) by mouth daily before breakfast. 90 capsule 3  . liraglutide (VICTOZA) 18 MG/3ML SOPN Inject 0.1 mLs (0.6 mg total) into the skin daily. 2 pen 0  . Naltrexone-buPROPion HCl ER (CONTRAVE) 8-90 MG TB12 Take two tablets in  the morning and one tab in the evening for 1 week and then increase to two tablets twice daily 60 tablet 0  . nystatin-triamcinolone ointment (MYCOLOG) Apply 1 application topically 2 (two) times daily. 30 g 1   Current Facility-Administered Medications on File Prior to Visit  Medication Dose Route Frequency Provider Last Rate Last Dose  . 0.9 %  sodium chloride infusion  500 mL Intravenous Once Kristina Mayer, MD        PAST MEDICAL HISTORY: Past Medical History:  Diagnosis Date  . Allergic rhinitis   . Anxiety   . Asthma   . Bulging disc   . Cancer (HCC)    cervical cancer   . Chronic back pain   . Chronic cough   . Depression   . Diabetes mellitus    diet control only  . Diverticulosis   . Dyspnea    chronic  . Family  history of adverse reaction to anesthesia    pts daughter had reaction to profolol - difficulty with breathing   . GERD (gastroesophageal reflux disease)   . High cholesterol   . History of bronchitis   . Hx of adenomatous and sessile serrated colonic polyps   . Hypertension   . Hypothyroidism   . Lumbosacral spondylosis without myelopathy    history bulging disc-chronic pain(level 3 to 8).  . Normal cardiac stress test 02/2015   with normal EF  . Numbness    feet bilat comes and goes   . Obesity   . Obstructive sleep apnea    uses c-pap at night  . Palpitations   . Pneumonia    last episode 2012  . PONV (postoperative nausea and vomiting)   . Prediabetes   . Restless leg   . Vitamin D deficiency   . Vocal cord dysfunction     PAST SURGICAL HISTORY: Past Surgical History:  Procedure Laterality Date  . ABDOMINAL HYSTERECTOMY  1996  . CHOLECYSTECTOMY  1994  . COLONOSCOPY WITH PROPOFOL N/A 03/10/2015   Procedure: COLONOSCOPY WITH PROPOFOL;  Surgeon: Kristina Mayer, MD;  Location: [facility];  Service: Endoscopy;  Laterality: N/A;  . ESOPHAGOGASTRODUODENOSCOPY N/A 10/27/2013   Procedure: ESOPHAGOGASTRODUODENOSCOPY (EGD);  Surgeon: Kristina Mayer, MD;  Location: [facility];  Service: Endoscopy;  Laterality: N/A;  . KNEE ARTHROSCOPY Right   . LAPAROSCOPIC GASTRIC SLEEVE RESECTION WITH HIATAL HERNIA REPAIR N/A 09/19/2015   Procedure: LAPAROSCOPIC GASTRIC SLEEVE RESECTION WITH HIATAL HERNIA REPAIR;  Surgeon: [name], MD;  Location: [facility];  Service: General;  Laterality: N/A;  . NOSE SURGERY  2004  . ovaries removed    . TUBAL LIGATION    . UPPER GI ENDOSCOPY  09/19/2015   Procedure: UPPER GI ENDOSCOPY;  Surgeon: [name], MD;  Location: [facility];  Service: General;;    SOCIAL HISTORY: Social History   Tobacco Use  . Smoking status: Never Smoker  . Smokeless tobacco: Never Used  Substance Use Topics  . Alcohol use: No  . Drug use: No    FAMILY HISTORY: Family  History  Problem Relation Age of Onset  . Emphysema Mother   . Allergies Mother   . Heart disease Mother   . Asthma Mother   . Ovarian cancer Mother   . Hypertension Mother   . Diabetes Mother   . High Cholesterol Mother   . Stroke Mother   . Thyroid disease Mother   . Depression Mother   . Anxiety disorder Mother   . Heart disease  Father        deceased at age 43 from heart attack  . Sudden death Father   . High Cholesterol Father   . High blood pressure Father   . Stroke Maternal Grandmother   . Heart disease Maternal Grandmother   . Cancer Maternal Grandfather   . Heart disease Paternal Grandmother   . Multiple sclerosis Paternal Grandmother   . Heart attack Paternal Grandfather   . Thyroid disease Brother        died at 56  . Heart attack Brother        died at 68  . COPD Brother   . Colon cancer Brother 67  . Hyperthyroidism Sister   . Hypertension Brother   . Hypertension Daughter   . Allergies Child   . Asthma Son   . Asthma Brother   . Asthma Brother   . Lung cancer Maternal Aunt   . COPD Sister        died at 34  . Sudden death Sister   . Asthma Son     ROS: Review of Systems  Constitutional: Positive for malaise/fatigue. Negative for weight loss.  Cardiovascular: Negative for palpitations.  Gastrointestinal: Negative for nausea and vomiting.  Musculoskeletal:       Negative muscle weakness  Endo/Heme/Allergies:       Negative hot/cold intolerance Negative hypoglycemia  Psychiatric/Behavioral: Positive for depression. Negative for suicidal ideas.    PHYSICAL EXAM: Blood pressure 138/87, pulse 78, temperature (!) 97.5 F (36.4 C), temperature source Oral, height 5\' 3"  (1.6 m), weight 232 lb (105.2 kg), SpO2 95 %. Body mass index is 41.1 kg/m. Physical Exam Vitals signs reviewed.  Constitutional:      Appearance: Normal appearance. She is obese.  Cardiovascular:     Rate and Rhythm: Normal rate.     Pulses: Normal pulses.  Pulmonary:      Effort: Pulmonary effort is normal.     Breath sounds: Normal breath sounds.  Musculoskeletal: Normal range of motion.  Skin:    General: Skin is warm and dry.  Neurological:     Mental Status: She is alert and oriented to person, place, and time.  Psychiatric:        Mood and Affect: Mood normal.        Behavior: Behavior normal.     RECENT LABS AND TESTS: BMET    Component Value Date/Time   NA 140 02/05/2018 1055   K 4.5 02/05/2018 1055   CL 105 02/05/2018 1055   CO2 22 02/05/2018 1055   GLUCOSE 91 02/05/2018 1055   GLUCOSE 84 11/10/2015 0527   BUN 10 02/05/2018 1055   CREATININE 0.62 02/05/2018 1055   CREATININE 0.88 02/02/2015 1458   CREATININE 0.88 02/02/2015 1458   CALCIUM 9.3 02/05/2018 1055   GFRNONAA 108 02/05/2018 1055   GFRAA 125 02/05/2018 1055   Lab Results  Component Value Date   HGBA1C 5.5 07/16/2018   HGBA1C 5.4 02/05/2018   HGBA1C 6.3 (H) 09/14/2015   HGBA1C (H) 02/12/2011    6.2 (NOTE)                                                                       According to the [place]  Recommendations for 2011, when HbA1c is used as a screening test:   >=6.5%   Diagnostic of Diabetes Mellitus           (if abnormal result  is confirmed)  5.7-6.4%   Increased risk of developing Diabetes Mellitus  References:Diagnosis and Classification of Diabetes Mellitus,Diabetes DJSH,7026,37(CHYIF 1):S62-S69 and Standards of Medical Care in         Diabetes - 2011,Diabetes OYDX,4128,78  (Suppl 1):S11-S61.   HGBA1C 6.2 (H) 02/02/2008   Lab Results  Component Value Date   INSULIN 6.9 07/16/2018   INSULIN 11.0 01/21/2018   CBC    Component Value Date/Time   WBC 5.8 02/05/2018 1055   WBC 3.8 (L) 11/10/2015 0527   RBC 4.43 02/05/2018 1055   RBC 4.46 11/10/2015 0527   HGB 12.4 02/05/2018 1055   HCT 37.3 02/05/2018 1055   PLT 121 (L) 11/10/2015 0527   MCV 84 02/05/2018 1055   MCH 28.0 02/05/2018 1055   MCH 27.4 11/10/2015 0527   MCHC 33.2 02/05/2018  1055   MCHC 31.3 11/10/2015 0527   RDW 13.9 02/05/2018 1055   LYMPHSABS 2.6 02/05/2018 1055   MONOABS 0.8 11/09/2015 0515   EOSABS 0.1 02/05/2018 1055   BASOSABS 0.0 02/05/2018 1055   Iron/TIBC/Ferritin/ %Sat No results found for: IRON, TIBC, FERRITIN, IRONPCTSAT Lipid Panel     Component Value Date/Time   CHOL 233 (H) 02/05/2018 1055   TRIG 157 (H) 02/05/2018 1055   HDL 51 02/05/2018 1055   CHOLHDL 4.5 02/12/2008 0540   VLDL 25 02/12/2008 0540   LDLCALC 151 (H) 02/05/2018 1055   Hepatic Function Panel     Component Value Date/Time   PROT 6.8 02/05/2018 1055   ALBUMIN 4.2 02/05/2018 1055   AST 16 02/05/2018 1055   ALT 11 02/05/2018 1055   ALKPHOS 110 02/05/2018 1055   BILITOT 0.3 02/05/2018 1055      Component Value Date/Time   TSH 0.481 07/16/2018 1259   TSH 0.593 05/12/2018 1451   TSH 0.329 (L) 03/16/2018 1618      OBESITY BEHAVIORAL INTERVENTION VISIT  Today's visit was # 17   Starting weight: 248 lbs Starting date: 01/21/18 Today's weight : 232 lbs Today's date: 11/02/2018 Total lbs lost to date: 16 At least 15 minutes were spent on discussing the following behavioral intervention visit.   ASK: We discussed the diagnosis of obesity with Princess Perna today and Varonica agreed to give [place] permission to discuss obesity behavioral modification therapy today.  ASSESS: Sebrina has the diagnosis of obesity and her BMI today is 41.11 Jetaun is in the action stage of change   ADVISE: Omelia was educated on the multiple health risks of obesity as well as the benefit of weight loss to improve her health. She was advised of the need for long term treatment and the importance of lifestyle modifications to improve her current health and to decrease her risk of future health problems.  AGREE: Multiple dietary modification options and treatment options were discussed and  Yvonnia agreed to follow the recommendations documented in the above note.  ARRANGE: Mahrosh was  educated on the importance of frequent visits to treat obesity as outlined per CMS and USPSTF guidelines and agreed to schedule her next follow up appointment today.  I, Trixie Dredge, am acting as transcriptionist for Ilene Qua, MD  I have reviewed the above documentation for accuracy and completeness, and I agree with the above. - Ilene Qua, MD

## 2018-11-10 ENCOUNTER — Ambulatory Visit (INDEPENDENT_AMBULATORY_CARE_PROVIDER_SITE_OTHER): Payer: Self-pay | Admitting: Plastic Surgery

## 2018-11-10 ENCOUNTER — Encounter: Payer: Self-pay | Admitting: Plastic Surgery

## 2018-11-10 VITALS — BP 123/84 | HR 89 | Temp 97.8°F | Ht 63.0 in | Wt 232.0 lb

## 2018-11-10 DIAGNOSIS — E1165 Type 2 diabetes mellitus with hyperglycemia: Secondary | ICD-10-CM

## 2018-11-10 DIAGNOSIS — Z794 Long term (current) use of insulin: Secondary | ICD-10-CM

## 2018-11-10 DIAGNOSIS — E881 Lipodystrophy, not elsewhere classified: Secondary | ICD-10-CM

## 2018-11-10 MED ORDER — ONDANSETRON HCL 4 MG PO TABS: 4.0000 mg | ORAL_TABLET | Freq: Three times a day (TID) | ORAL | 0 refills | Status: AC | PRN

## 2018-11-10 MED ORDER — CIPROFLOXACIN HCL 500 MG PO TABS: 500.0000 mg | ORAL_TABLET | Freq: Two times a day (BID) | ORAL | 0 refills | Status: AC

## 2018-11-10 MED ORDER — HYDROCODONE-ACETAMINOPHEN 5-325 MG PO TABS: 1.0000 | ORAL_TABLET | Freq: Four times a day (QID) | ORAL | 0 refills | Status: AC | PRN

## 2018-11-10 NOTE — Progress Notes (Signed)
Patient ID: Kristina [name], female    DOB: 01-31-1971, 48 y.o.   MRN: [id number]   Chief Complaint  Patient presents with  . Pre-op Exam    for Panniculectomy    The patient is a 48 year old female here for her history and physical for panniculectomy.  She had a gastric sleeve in 2017 by Dr. Redmond Pulling.  She lost over 100 pounds since that time.  Her weight has been stable.  She is 5 feet 3 inches tall.  Her last mammogram was May 2019.  She has had a hysterectomy and sinus surgery.  She is done well after those surgeries.  She has been doing 2 protein shakes a day in preparation for her healing.  She is also on medication for her diabetes and that seems to be stable.  We are planning on a panniculectomy.  She is not had any recent illnesses.  She does have a rash today in the pannus area.   Review of Systems  Constitutional: Negative.  Negative for appetite change.  HENT: Negative.   Eyes: Negative.   Respiratory: Negative.   Cardiovascular: Negative.   Gastrointestinal: Negative.   Endocrine: Negative.   Genitourinary: Negative.   Musculoskeletal: Positive for back pain.  Skin: Positive for rash.  Psychiatric/Behavioral: Negative.     Past Medical History:  Diagnosis Date  . Allergic rhinitis   . Anxiety   . Asthma   . Bulging disc   . Cancer (HCC)    cervical cancer   . Chronic back pain   . Chronic cough   . Depression   . Diabetes mellitus    diet control only  . Diverticulosis   . Dyspnea    chronic  . Family history of adverse reaction to anesthesia    pts daughter had reaction to profolol - difficulty with breathing   . GERD (gastroesophageal reflux disease)   . High cholesterol   . History of bronchitis   . Hx of adenomatous and sessile serrated colonic polyps   . Hypertension   . Hypothyroidism   . Lumbosacral spondylosis without myelopathy    history bulging disc-chronic pain(level 3 to 8).  . Normal cardiac stress test 02/2015   with normal EF  .  Numbness    feet bilat comes and goes   . Obesity   . Obstructive sleep apnea    uses c-pap at night  . Palpitations   . Pneumonia    last episode 2012  . PONV (postoperative nausea and vomiting)   . Prediabetes   . Restless leg   . Vitamin D deficiency   . Vocal cord dysfunction     Past Surgical History:  Procedure Laterality Date  . ABDOMINAL HYSTERECTOMY  1996  . CHOLECYSTECTOMY  1994  . COLONOSCOPY WITH PROPOFOL N/A 03/10/2015   Procedure: COLONOSCOPY WITH PROPOFOL;  Surgeon: [name], MD;  Location: [facility];  Service: Endoscopy;  Laterality: N/A;  . ESOPHAGOGASTRODUODENOSCOPY N/A 10/27/2013   Procedure: ESOPHAGOGASTRODUODENOSCOPY (EGD);  Surgeon: [name], MD;  Location: [facility];  Service: Endoscopy;  Laterality: N/A;  . KNEE ARTHROSCOPY Right   . LAPAROSCOPIC GASTRIC SLEEVE RESECTION WITH HIATAL HERNIA REPAIR N/A 09/19/2015   Procedure: LAPAROSCOPIC GASTRIC SLEEVE RESECTION WITH HIATAL HERNIA REPAIR;  Surgeon: [name], MD;  Location: [facility];  Service: General;  Laterality: N/A;  . NOSE SURGERY  2004  . ovaries removed    . TUBAL LIGATION    . UPPER GI ENDOSCOPY  09/19/2015   Procedure: UPPER GI ENDOSCOPY;  Surgeon: [name], MD;  Location: [facility];  Service: General;;      Current Outpatient Medications:  .  buPROPion (WELLBUTRIN SR) 150 MG 12 hr tablet, Take 1 tablet (150 mg total) by mouth 2 (two) times daily., Disp: 60 tablet, Rfl: 0 .  dexlansoprazole (DEXILANT) 60 MG capsule, Take 1 capsule (60 mg total) by mouth daily before breakfast., Disp: 90 capsule, Rfl: 3 .  levothyroxine (SYNTHROID, LEVOTHROID) 125 MCG tablet, Take 1 tablet (125 mcg total) by mouth daily., Disp: 30 tablet, Rfl: 0 .  liraglutide (VICTOZA) 18 MG/3ML SOPN, Inject 0.1 mLs (0.6 mg total) into the skin daily., Disp: 2 pen, Rfl: 0 .  metFORMIN (GLUCOPHAGE) 500 MG tablet, Take 1 tablet (500 mg total) by mouth daily with breakfast., Disp: 30 tablet, Rfl: 0 .  nystatin  (MYCOSTATIN/NYSTOP) powder, Apply topically 2 (two) times daily., Disp: 15 g, Rfl: 0 .  nystatin-triamcinolone ointment (MYCOLOG), Apply 1 application topically 2 (two) times daily., Disp: 30 g, Rfl: 1 .  Vitamin D, Ergocalciferol, (DRISDOL) 1.25 MG (50000 UT) CAPS capsule, Take 1 capsule (50,000 Units total) by mouth every 7 (seven) days., Disp: 4 capsule, Rfl: 0 .  ciprofloxacin (CIPRO) 500 MG tablet, Take 1 tablet (500 mg total) by mouth 2 (two) times daily for 5 days., Disp: 10 tablet, Rfl: 0 .  HYDROcodone-acetaminophen (NORCO) 5-325 MG tablet, Take 1 tablet by mouth every 6 (six) hours as needed for up to 5 days for moderate pain., Disp: 20 tablet, Rfl: 0 .  ondansetron (ZOFRAN) 4 MG tablet, Take 1 tablet (4 mg total) by mouth every 8 (eight) hours as needed for up to 5 days., Disp: 15 tablet, Rfl: 0  Current Facility-Administered Medications:  .  0.9 %  sodium chloride infusion, 500 mL, Intravenous, Once, [name], MD   Objective:   Vitals:   11/10/18 1012  BP: 123/84  Pulse: 89  Temp: 97.8 F (36.6 C)  SpO2: 94%    Physical Exam Vitals signs and nursing note reviewed.  Constitutional:      Appearance: Normal appearance.  HENT:     Head: Normocephalic and atraumatic.     Nose: Nose normal.     Mouth/Throat:     Mouth: Mucous membranes are moist.  Cardiovascular:     Rate and Rhythm: Normal rate.     Pulses: Normal pulses.  Pulmonary:     Effort: Pulmonary effort is normal. No respiratory distress.  Abdominal:     General: Abdomen is flat. There is no distension.     Tenderness: There is no abdominal tenderness.  Skin:    General: Skin is warm.  Neurological:     General: No focal deficit present.     Mental Status: She is alert.  Psychiatric:        Mood and Affect: Mood normal.        Thought Content: Thought content normal.        Judgment: Judgment normal.     Assessment & Plan:  Type 2 diabetes mellitus with hyperglycemia, with long-term current  use of insulin (HCC)  Lipodystrophy  Plan for panniculectomy and placement of 2 drains.  The risks that can be encountered with and after excision of a skin lesion were discussed and include the following but not limited to these: bleeding, infection, delayed healing, anesthesia risks, skin sensation changes, injury to structures including nerves, blood vessels, and muscles which may be temporary or permanent,  allergies to tape, suture materials and glues, blood products, topical preparations or injected agents, skin contour irregularities, skin discoloration and swelling, deep vein thrombosis, cardiac and pulmonary complications, pain, which may persist, persistent pain, recurrence of the lesion, poor healing of the incision, possible need for revisional surgery or staged procedures.  [place], DO

## 2018-11-10 NOTE — H&P (View-Only) (Signed)
Patient ID: Kristina [name], female    DOB: 05/25/1971, 48 y.o.   MRN: [id number]   Chief Complaint  Patient presents with  . Pre-op Exam    for Panniculectomy    The patient is a 48 year old female here for her history and physical for panniculectomy.  She had a gastric sleeve in 2017 by Dr. Redmond Pulling.  She lost over 100 pounds since that time.  Her weight has been stable.  She is 5 feet 3 inches tall.  Her last mammogram was May 2019.  She has had a hysterectomy and sinus surgery.  She is done well after those surgeries.  She has been doing 2 protein shakes a day in preparation for her healing.  She is also on medication for her diabetes and that seems to be stable.  We are planning on a panniculectomy.  She is not had any recent illnesses.  She does have a rash today in the pannus area.   Review of Systems  Constitutional: Negative.  Negative for appetite change.  HENT: Negative.   Eyes: Negative.   Respiratory: Negative.   Cardiovascular: Negative.   Gastrointestinal: Negative.   Endocrine: Negative.   Genitourinary: Negative.   Musculoskeletal: Positive for back pain.  Skin: Positive for rash.  Psychiatric/Behavioral: Negative.     Past Medical History:  Diagnosis Date  . Allergic rhinitis   . Anxiety   . Asthma   . Bulging disc   . Cancer (HCC)    cervical cancer   . Chronic back pain   . Chronic cough   . Depression   . Diabetes mellitus    diet control only  . Diverticulosis   . Dyspnea    chronic  . Family history of adverse reaction to anesthesia    pts daughter had reaction to profolol - difficulty with breathing   . GERD (gastroesophageal reflux disease)   . High cholesterol   . History of bronchitis   . Hx of adenomatous and sessile serrated colonic polyps   . Hypertension   . Hypothyroidism   . Lumbosacral spondylosis without myelopathy    history bulging disc-chronic pain(level 3 to 8).  . Normal cardiac stress test 02/2015   with normal EF  .  Numbness    feet bilat comes and goes   . Obesity   . Obstructive sleep apnea    uses c-pap at night  . Palpitations   . Pneumonia    last episode 2012  . PONV (postoperative nausea and vomiting)   . Prediabetes   . Restless leg   . Vitamin D deficiency   . Vocal cord dysfunction     Past Surgical History:  Procedure Laterality Date  . ABDOMINAL HYSTERECTOMY  1996  . CHOLECYSTECTOMY  1994  . COLONOSCOPY WITH PROPOFOL N/A 03/10/2015   Procedure: COLONOSCOPY WITH PROPOFOL;  Surgeon: [name], MD;  Location: [facility];  Service: Endoscopy;  Laterality: N/A;  . ESOPHAGOGASTRODUODENOSCOPY N/A 10/27/2013   Procedure: ESOPHAGOGASTRODUODENOSCOPY (EGD);  Surgeon: [name], MD;  Location: [facility];  Service: Endoscopy;  Laterality: N/A;  . KNEE ARTHROSCOPY Right   . LAPAROSCOPIC GASTRIC SLEEVE RESECTION WITH HIATAL HERNIA REPAIR N/A 09/19/2015   Procedure: LAPAROSCOPIC GASTRIC SLEEVE RESECTION WITH HIATAL HERNIA REPAIR;  Surgeon: [name], MD;  Location: [facility];  Service: General;  Laterality: N/A;  . NOSE SURGERY  2004  . ovaries removed    . TUBAL LIGATION    . UPPER GI ENDOSCOPY  09/19/2015   Procedure: UPPER GI ENDOSCOPY;  Surgeon: [name], MD;  Location: [facility];  Service: General;;      Current Outpatient Medications:  .  buPROPion (WELLBUTRIN SR) 150 MG 12 hr tablet, Take 1 tablet (150 mg total) by mouth 2 (two) times daily., Disp: 60 tablet, Rfl: 0 .  dexlansoprazole (DEXILANT) 60 MG capsule, Take 1 capsule (60 mg total) by mouth daily before breakfast., Disp: 90 capsule, Rfl: 3 .  levothyroxine (SYNTHROID, LEVOTHROID) 125 MCG tablet, Take 1 tablet (125 mcg total) by mouth daily., Disp: 30 tablet, Rfl: 0 .  liraglutide (VICTOZA) 18 MG/3ML SOPN, Inject 0.1 mLs (0.6 mg total) into the skin daily., Disp: 2 pen, Rfl: 0 .  metFORMIN (GLUCOPHAGE) 500 MG tablet, Take 1 tablet (500 mg total) by mouth daily with breakfast., Disp: 30 tablet, Rfl: 0 .  nystatin  (MYCOSTATIN/NYSTOP) powder, Apply topically 2 (two) times daily., Disp: 15 g, Rfl: 0 .  nystatin-triamcinolone ointment (MYCOLOG), Apply 1 application topically 2 (two) times daily., Disp: 30 g, Rfl: 1 .  Vitamin D, Ergocalciferol, (DRISDOL) 1.25 MG (50000 UT) CAPS capsule, Take 1 capsule (50,000 Units total) by mouth every 7 (seven) days., Disp: 4 capsule, Rfl: 0 .  ciprofloxacin (CIPRO) 500 MG tablet, Take 1 tablet (500 mg total) by mouth 2 (two) times daily for 5 days., Disp: 10 tablet, Rfl: 0 .  HYDROcodone-acetaminophen (NORCO) 5-325 MG tablet, Take 1 tablet by mouth every 6 (six) hours as needed for up to 5 days for moderate pain., Disp: 20 tablet, Rfl: 0 .  ondansetron (ZOFRAN) 4 MG tablet, Take 1 tablet (4 mg total) by mouth every 8 (eight) hours as needed for up to 5 days., Disp: 15 tablet, Rfl: 0  Current Facility-Administered Medications:  .  0.9 %  sodium chloride infusion, 500 mL, Intravenous, Once, [name], MD   Objective:   Vitals:   11/10/18 1012  BP: 123/84  Pulse: 89  Temp: 97.8 F (36.6 C)  SpO2: 94%    Physical Exam Vitals signs and nursing note reviewed.  Constitutional:      Appearance: Normal appearance.  HENT:     Head: Normocephalic and atraumatic.     Nose: Nose normal.     Mouth/Throat:     Mouth: Mucous membranes are moist.  Cardiovascular:     Rate and Rhythm: Normal rate.     Pulses: Normal pulses.  Pulmonary:     Effort: Pulmonary effort is normal. No respiratory distress.  Abdominal:     General: Abdomen is flat. There is no distension.     Tenderness: There is no abdominal tenderness.  Skin:    General: Skin is warm.  Neurological:     General: No focal deficit present.     Mental Status: She is alert.  Psychiatric:        Mood and Affect: Mood normal.        Thought Content: Thought content normal.        Judgment: Judgment normal.     Assessment & Plan:  Type 2 diabetes mellitus with hyperglycemia, with long-term current  use of insulin (HCC)  Lipodystrophy  Plan for panniculectomy and placement of 2 drains.  The risks that can be encountered with and after excision of a skin lesion were discussed and include the following but not limited to these: bleeding, infection, delayed healing, anesthesia risks, skin sensation changes, injury to structures including nerves, blood vessels, and muscles which may be temporary or permanent,  allergies to tape, suture materials and glues, blood products, topical preparations or injected agents, skin contour irregularities, skin discoloration and swelling, deep vein thrombosis, cardiac and pulmonary complications, pain, which may persist, persistent pain, recurrence of the lesion, poor healing of the incision, possible need for revisional surgery or staged procedures.  [place], DO

## 2018-11-16 NOTE — Progress Notes (Unsigned)
  Office: [phone]  /  Fax: [phone]    Date: November 17, 2018   Time Seen:*** Duration:*** Provider: Glennie Isle, Psy.D. Type of Session: Individual Therapy  Type of Contact: Face-to-face  Session Content: Kristina [name] is a 48 y.o. female presenting for a follow-up appointment to address the previously established treatment goal of decreasing emotional eating. The session was initiated with the administration of the PHQ-9 and GAD-7, as well as a brief check-in. *** Kristina [name] was receptive to today's session as evidenced by openness to sharing, responsiveness to feedback, and ***.  Mental Status Examination: Kristina [name] arrived on time for the appointment. She presented as appropriately dressed and groomed. Kristina [name] appeared her stated age and demonstrated adequate orientation to time, place, person, and purpose of the appointment. She also demonstrated appropriate eye contact. No psychomotor abnormalities or behavioral peculiarities noted. Her mood was {gbmood:21757} with congruent affect. Her thought processes were logical, linear, and goal-directed. No hallucinations, delusions, bizarre thinking or behavior reported or observed. Judgment, insight, and impulse control appeared to be grossly intact. There was no evidence of paraphasias (i.e., errors in speech, gross mispronunciations, and word substitutions), repetition deficits, or disturbances in volume or prosody (i.e., rhythm and intonation). There was no evidence of attention or memory impairments. Kristina [name] denied current suicidal and homicidal ideation, plan and intent.   Structured Assessment Results: The Patient Health Questionnaire-9 (PHQ-9) is a self-report measure that assesses symptoms and severity of depression over the course of the last two weeks. Kristina [name] obtained a score of *** suggesting {GBPHQ9SEVERITY:21752}. Kristina [name] finds the endorsed symptoms to be {gbphq9difficulty:21754}.  The Generalized Anxiety Disorder-7 (GAD-7) is a brief self-report measure  that assesses symptoms of anxiety over the course of the last two weeks. Kristina [name] obtained a score of *** suggesting {gbgad7severity:21753}.  Interventions:  {Interventions:22172}  DSM-5 Diagnosis: 311 (F32.8) Other Specified Depressive Disorder, Emotional Eating Behaviors  Treatment Goal & Progress: During the initial appointment with this provider, the following treatment goal was established: decrease emotional eating. Kristina [name] has demonstrated progress in her goal as evidenced by ***  Plan: Kristina [name] continues to appear able and willing to participate as evidenced by engagement in reciprocal conversation, and asking questions for clarification as appropriate.*** The next appointment will be scheduled in {gbweeks:21758}. The next session will focus on reviewing learned skills, and working towards the established treatment goal.***

## 2018-11-17 ENCOUNTER — Ambulatory Visit (INDEPENDENT_AMBULATORY_CARE_PROVIDER_SITE_OTHER): Payer: Self-pay | Admitting: Psychology

## 2018-11-17 ENCOUNTER — Ambulatory Visit (INDEPENDENT_AMBULATORY_CARE_PROVIDER_SITE_OTHER): Payer: Self-pay | Admitting: Family Medicine

## 2018-11-18 ENCOUNTER — Other Ambulatory Visit: Payer: Self-pay

## 2018-11-18 ENCOUNTER — Encounter (HOSPITAL_BASED_OUTPATIENT_CLINIC_OR_DEPARTMENT_OTHER): Payer: Self-pay

## 2018-11-21 ENCOUNTER — Other Ambulatory Visit (INDEPENDENT_AMBULATORY_CARE_PROVIDER_SITE_OTHER): Payer: Self-pay | Admitting: Family Medicine

## 2018-11-21 DIAGNOSIS — E038 Other specified hypothyroidism: Secondary | ICD-10-CM

## 2018-11-23 ENCOUNTER — Encounter (HOSPITAL_BASED_OUTPATIENT_CLINIC_OR_DEPARTMENT_OTHER)
Admission: RE | Admit: 2018-11-23 | Discharge: 2018-11-23 | Disposition: A | Discharge status: Home / Self Care | Payer: Medicare Other | Source: Ambulatory Visit | Attending: Plastic Surgery | Admitting: Plastic Surgery

## 2018-11-23 DIAGNOSIS — E559 Vitamin D deficiency, unspecified: Secondary | ICD-10-CM | POA: Diagnosis not present

## 2018-11-23 DIAGNOSIS — E1165 Type 2 diabetes mellitus with hyperglycemia: Secondary | ICD-10-CM | POA: Diagnosis not present

## 2018-11-23 DIAGNOSIS — G8929 Other chronic pain: Secondary | ICD-10-CM | POA: Diagnosis not present

## 2018-11-23 DIAGNOSIS — G4733 Obstructive sleep apnea (adult) (pediatric): Secondary | ICD-10-CM | POA: Diagnosis not present

## 2018-11-23 DIAGNOSIS — Z79899 Other long term (current) drug therapy: Secondary | ICD-10-CM | POA: Diagnosis not present

## 2018-11-23 DIAGNOSIS — Z87892 Personal history of anaphylaxis: Secondary | ICD-10-CM | POA: Diagnosis not present

## 2018-11-23 DIAGNOSIS — Z6841 Body Mass Index (BMI) 40.0 and over, adult: Secondary | ICD-10-CM | POA: Diagnosis not present

## 2018-11-23 DIAGNOSIS — Z79890 Hormone replacement therapy (postmenopausal): Secondary | ICD-10-CM | POA: Diagnosis not present

## 2018-11-23 DIAGNOSIS — J45909 Unspecified asthma, uncomplicated: Secondary | ICD-10-CM | POA: Diagnosis not present

## 2018-11-23 DIAGNOSIS — Z7984 Long term (current) use of oral hypoglycemic drugs: Secondary | ICD-10-CM | POA: Diagnosis not present

## 2018-11-23 DIAGNOSIS — M793 Panniculitis, unspecified: Secondary | ICD-10-CM | POA: Diagnosis not present

## 2018-11-23 DIAGNOSIS — Z8541 Personal history of malignant neoplasm of cervix uteri: Secondary | ICD-10-CM | POA: Diagnosis not present

## 2018-11-23 DIAGNOSIS — Z882 Allergy status to sulfonamides status: Secondary | ICD-10-CM | POA: Diagnosis not present

## 2018-11-23 DIAGNOSIS — Z885 Allergy status to narcotic agent status: Secondary | ICD-10-CM | POA: Diagnosis not present

## 2018-11-23 DIAGNOSIS — R200 Anesthesia of skin: Secondary | ICD-10-CM | POA: Diagnosis not present

## 2018-11-23 DIAGNOSIS — R21 Rash and other nonspecific skin eruption: Secondary | ICD-10-CM | POA: Diagnosis not present

## 2018-11-23 DIAGNOSIS — F329 Major depressive disorder, single episode, unspecified: Secondary | ICD-10-CM | POA: Diagnosis not present

## 2018-11-23 DIAGNOSIS — K219 Gastro-esophageal reflux disease without esophagitis: Secondary | ICD-10-CM | POA: Diagnosis not present

## 2018-11-23 DIAGNOSIS — I10 Essential (primary) hypertension: Secondary | ICD-10-CM | POA: Diagnosis not present

## 2018-11-23 DIAGNOSIS — Z9884 Bariatric surgery status: Secondary | ICD-10-CM | POA: Diagnosis not present

## 2018-11-23 DIAGNOSIS — R05 Cough: Secondary | ICD-10-CM | POA: Diagnosis not present

## 2018-11-23 DIAGNOSIS — E039 Hypothyroidism, unspecified: Secondary | ICD-10-CM | POA: Diagnosis not present

## 2018-11-23 DIAGNOSIS — E881 Lipodystrophy, not elsewhere classified: Secondary | ICD-10-CM | POA: Diagnosis not present

## 2018-11-23 LAB — BASIC METABOLIC PANEL
Anion gap: 8 (ref 5–15)
BUN: 10 mg/dL (ref 6–20)
CO2: 23 mmol/L (ref 22–32)
Calcium: 9.5 mg/dL (ref 8.9–10.3)
Chloride: 109 mmol/L (ref 98–111)
Creatinine, Ser: 0.74 mg/dL (ref 0.44–1.00)
GFR calc Af Amer: >60 mL/min (ref >60)
GFR calc non Af Amer: >60 mL/min (ref >60)
Glucose, Bld: 93 mg/dL (ref 70–99)
Potassium: 3.8 mmol/L (ref 3.5–5.1)
SODIUM: 140 mmol/L (ref 135–145)

## 2018-11-23 MED ORDER — LEVOTHYROXINE SODIUM 125 MCG PO TABS: 125.0000 ug | ORAL_TABLET | Freq: Every day | ORAL | 0 refills | Status: DC

## 2018-11-25 NOTE — Anesthesia Preprocedure Evaluation (Addendum)
Anesthesia Evaluation  Patient identified by MRN, date of birth, ID band Patient awake    Reviewed: Allergy & Precautions, NPO status , Patient's Chart, lab work & pertinent test results  History of Anesthesia Complications (+) PONV and history of anesthetic complications  Airway Mallampati: II  TM Distance: >3 FB Neck ROM: Full    Dental  (+) Dental Advisory Given   Pulmonary asthma , sleep apnea ,    breath sounds clear to auscultation       Cardiovascular hypertension (no longer on meds),  Rhythm:Regular Rate:Normal     Neuro/Psych  Headaches, PSYCHIATRIC DISORDERS Anxiety Depression    GI/Hepatic Neg liver ROS, GERD  Controlled and Medicated, S/p gastric sleeve    Endo/Other  diabetes, Type 2Hypothyroidism Morbid obesity  Renal/GU negative Renal ROS     Musculoskeletal  (+) Arthritis ,   Abdominal   Peds  Hematology negative hematology ROS (+)   Anesthesia Other Findings Anaphylaxis to ampicillin  Reproductive/Obstetrics  S/p tubal ligation                             Anesthesia Physical Anesthesia Plan  ASA: III  Anesthesia Plan: General   Post-op Pain Management:    Induction: Intravenous  PONV Risk Score and Plan: 4 or greater and Treatment may vary due to age or medical condition, Ondansetron, Dexamethasone, Midazolam and Scopolamine patch - Pre-op  Airway Management Planned: Oral ETT  Additional Equipment: None  Intra-op Plan:   Post-operative Plan: Extubation in OR  Informed Consent: I have reviewed the patients History and Physical, chart, labs and discussed the procedure including the risks, benefits and alternatives for the proposed anesthesia with the patient or authorized representative who has indicated his/her understanding and acceptance.     Dental advisory given  Plan Discussed with: CRNA and Anesthesiologist  Anesthesia Plan Comments:         Anesthesia Quick Evaluation

## 2018-11-26 ENCOUNTER — Ambulatory Visit (HOSPITAL_BASED_OUTPATIENT_CLINIC_OR_DEPARTMENT_OTHER)
Admission: RE | Admit: 2018-11-26 | Discharge: 2018-11-27 | Disposition: A | Discharge status: Home / Self Care | Payer: Medicare Other | Attending: Plastic Surgery | Admitting: Plastic Surgery

## 2018-11-26 ENCOUNTER — Other Ambulatory Visit: Payer: Self-pay

## 2018-11-26 ENCOUNTER — Encounter (HOSPITAL_BASED_OUTPATIENT_CLINIC_OR_DEPARTMENT_OTHER)
Admission: RE | Disposition: A | Discharge status: Home / Self Care | Payer: Self-pay | Source: Home / Self Care | Attending: Plastic Surgery

## 2018-11-26 ENCOUNTER — Ambulatory Visit (HOSPITAL_BASED_OUTPATIENT_CLINIC_OR_DEPARTMENT_OTHER): Payer: Medicare Other | Admitting: Anesthesiology

## 2018-11-26 ENCOUNTER — Encounter (HOSPITAL_BASED_OUTPATIENT_CLINIC_OR_DEPARTMENT_OTHER): Payer: Self-pay | Admitting: *Deleted

## 2018-11-26 DIAGNOSIS — K219 Gastro-esophageal reflux disease without esophagitis: Secondary | ICD-10-CM | POA: Insufficient documentation

## 2018-11-26 DIAGNOSIS — E1165 Type 2 diabetes mellitus with hyperglycemia: Secondary | ICD-10-CM | POA: Diagnosis not present

## 2018-11-26 DIAGNOSIS — I10 Essential (primary) hypertension: Secondary | ICD-10-CM | POA: Insufficient documentation

## 2018-11-26 DIAGNOSIS — Z6841 Body Mass Index (BMI) 40.0 and over, adult: Secondary | ICD-10-CM | POA: Insufficient documentation

## 2018-11-26 DIAGNOSIS — R21 Rash and other nonspecific skin eruption: Secondary | ICD-10-CM | POA: Insufficient documentation

## 2018-11-26 DIAGNOSIS — R200 Anesthesia of skin: Secondary | ICD-10-CM | POA: Insufficient documentation

## 2018-11-26 DIAGNOSIS — G4733 Obstructive sleep apnea (adult) (pediatric): Secondary | ICD-10-CM | POA: Insufficient documentation

## 2018-11-26 DIAGNOSIS — Z9884 Bariatric surgery status: Secondary | ICD-10-CM | POA: Insufficient documentation

## 2018-11-26 DIAGNOSIS — M793 Panniculitis, unspecified: Secondary | ICD-10-CM

## 2018-11-26 DIAGNOSIS — F329 Major depressive disorder, single episode, unspecified: Secondary | ICD-10-CM | POA: Insufficient documentation

## 2018-11-26 DIAGNOSIS — Z885 Allergy status to narcotic agent status: Secondary | ICD-10-CM | POA: Insufficient documentation

## 2018-11-26 DIAGNOSIS — Z7984 Long term (current) use of oral hypoglycemic drugs: Secondary | ICD-10-CM | POA: Insufficient documentation

## 2018-11-26 DIAGNOSIS — R05 Cough: Secondary | ICD-10-CM | POA: Insufficient documentation

## 2018-11-26 DIAGNOSIS — Z90710 Acquired absence of both cervix and uterus: Secondary | ICD-10-CM | POA: Insufficient documentation

## 2018-11-26 DIAGNOSIS — E039 Hypothyroidism, unspecified: Secondary | ICD-10-CM | POA: Insufficient documentation

## 2018-11-26 DIAGNOSIS — Z79890 Hormone replacement therapy (postmenopausal): Secondary | ICD-10-CM | POA: Insufficient documentation

## 2018-11-26 DIAGNOSIS — Z8541 Personal history of malignant neoplasm of cervix uteri: Secondary | ICD-10-CM | POA: Insufficient documentation

## 2018-11-26 DIAGNOSIS — G8929 Other chronic pain: Secondary | ICD-10-CM | POA: Insufficient documentation

## 2018-11-26 DIAGNOSIS — Z882 Allergy status to sulfonamides status: Secondary | ICD-10-CM | POA: Insufficient documentation

## 2018-11-26 DIAGNOSIS — Z79899 Other long term (current) drug therapy: Secondary | ICD-10-CM | POA: Insufficient documentation

## 2018-11-26 DIAGNOSIS — Z87892 Personal history of anaphylaxis: Secondary | ICD-10-CM | POA: Insufficient documentation

## 2018-11-26 DIAGNOSIS — E559 Vitamin D deficiency, unspecified: Secondary | ICD-10-CM | POA: Insufficient documentation

## 2018-11-26 DIAGNOSIS — J45909 Unspecified asthma, uncomplicated: Secondary | ICD-10-CM | POA: Insufficient documentation

## 2018-11-26 DIAGNOSIS — E881 Lipodystrophy, not elsewhere classified: Secondary | ICD-10-CM | POA: Insufficient documentation

## 2018-11-26 HISTORY — DX: Cardiac murmur, unspecified: R01.1

## 2018-11-26 HISTORY — PX: PANNICULECTOMY: SHX5360

## 2018-11-26 HISTORY — PX: LIPOSUCTION: SHX10

## 2018-11-26 LAB — GLUCOSE, CAPILLARY
Glucose-Capillary: 81 mg/dL (ref 70–99)
Glucose-Capillary: 112 mg/dL — ABNORMAL HIGH (ref 70–99)
Glucose-Capillary: 208 mg/dL — ABNORMAL HIGH (ref 70–99)

## 2018-11-26 LAB — POCT HEMOGLOBIN-HEMACUE: Hemoglobin: 11.5 g/dL — ABNORMAL LOW (ref 12.0–15.0)

## 2018-11-26 SURGERY — PANNICULECTOMY
Anesthesia: General | Site: Abdomen

## 2018-11-26 MED ORDER — POLYETHYLENE GLYCOL 3350 17 G PO PACK: 17.0000 g | PACK | Freq: Every day | ORAL | Status: DC | PRN

## 2018-11-26 MED ORDER — CIPROFLOXACIN IN D5W 400 MG/200ML IV SOLN
400.0000 mg | INTRAVENOUS | Status: AC
  Administered 2018-11-26: 400 mg via INTRAVENOUS

## 2018-11-26 MED ORDER — FENTANYL CITRATE (PF) 100 MCG/2ML IJ SOLN
INTRAMUSCULAR | Status: AC
  Filled 2018-11-26: qty 2

## 2018-11-26 MED ORDER — LIDOCAINE HCL (PF) 1 % IJ SOLN
INTRAMUSCULAR | Status: AC
  Filled 2018-11-26: qty 60

## 2018-11-26 MED ORDER — SUCCINYLCHOLINE CHLORIDE 200 MG/10ML IV SOSY
PREFILLED_SYRINGE | INTRAVENOUS | Status: AC
  Filled 2018-11-26: qty 10

## 2018-11-26 MED ORDER — BUPIVACAINE-EPINEPHRINE (PF) 0.25% -1:200000 IJ SOLN
INTRAMUSCULAR | Status: AC
  Filled 2018-11-26: qty 90

## 2018-11-26 MED ORDER — KCL IN DEXTROSE-NACL 20-5-0.45 MEQ/L-%-% IV SOLN
INTRAVENOUS | Status: DC
  Administered 2018-11-26 (×3): via INTRAVENOUS
  Filled 2018-11-26 (×3): qty 1000

## 2018-11-26 MED ORDER — FENTANYL CITRATE (PF) 100 MCG/2ML IJ SOLN: 50.0000 ug | INTRAMUSCULAR | Status: DC | PRN

## 2018-11-26 MED ORDER — SUGAMMADEX SODIUM 200 MG/2ML IV SOLN
INTRAVENOUS | Status: DC | PRN
  Administered 2018-11-26: 250 mg via INTRAVENOUS

## 2018-11-26 MED ORDER — EPHEDRINE 5 MG/ML INJ
INTRAVENOUS | Status: AC
  Filled 2018-11-26: qty 10

## 2018-11-26 MED ORDER — ACETAMINOPHEN 325 MG PO TABS
325.0000 mg | ORAL_TABLET | Freq: Four times a day (QID) | ORAL | Status: DC
  Administered 2018-11-26 – 2018-11-27 (×3): 325 mg via ORAL
  Filled 2018-11-26 (×3): qty 1

## 2018-11-26 MED ORDER — LIDOCAINE-EPINEPHRINE 1 %-1:100000 IJ SOLN
INTRAMUSCULAR | Status: DC | PRN
  Administered 2018-11-26: 20 mL

## 2018-11-26 MED ORDER — ZOLPIDEM TARTRATE 5 MG PO TABS
5.0000 mg | ORAL_TABLET | Freq: Every evening | ORAL | Status: DC | PRN
  Administered 2018-11-26: 5 mg via ORAL
  Filled 2018-11-26: qty 1

## 2018-11-26 MED ORDER — PROMETHAZINE HCL 25 MG/ML IJ SOLN
6.2500 mg | INTRAMUSCULAR | Status: DC | PRN
  Administered 2018-11-26: 6.25 mg via INTRAVENOUS

## 2018-11-26 MED ORDER — DEXAMETHASONE SODIUM PHOSPHATE 10 MG/ML IJ SOLN
INTRAMUSCULAR | Status: AC
  Filled 2018-11-26: qty 1

## 2018-11-26 MED ORDER — SODIUM CHLORIDE (PF) 0.9 % IJ SOLN
INTRAMUSCULAR | Status: AC
  Filled 2018-11-26: qty 30

## 2018-11-26 MED ORDER — ONDANSETRON HCL 4 MG/2ML IJ SOLN
4.0000 mg | Freq: Four times a day (QID) | INTRAMUSCULAR | Status: DC | PRN
  Administered 2018-11-26: 4 mg via INTRAVENOUS
  Filled 2018-11-26: qty 2

## 2018-11-26 MED ORDER — LIDOCAINE HCL (CARDIAC) PF 100 MG/5ML IV SOSY
PREFILLED_SYRINGE | INTRAVENOUS | Status: DC | PRN
  Administered 2018-11-26: 80 mg via INTRAVENOUS

## 2018-11-26 MED ORDER — NAPROXEN 500 MG PO TABS
500.0000 mg | ORAL_TABLET | Freq: Two times a day (BID) | ORAL | Status: DC | PRN
  Administered 2018-11-26: 500 mg via ORAL
  Filled 2018-11-26: qty 2

## 2018-11-26 MED ORDER — MIDAZOLAM HCL 2 MG/2ML IJ SOLN
1.0000 mg | INTRAMUSCULAR | Status: DC | PRN
  Administered 2018-11-26: 2 mg via INTRAVENOUS

## 2018-11-26 MED ORDER — SODIUM CHLORIDE (PF) 0.9 % IJ SOLN
INTRAMUSCULAR | Status: DC | PRN
  Administered 2018-11-26: 100 mL

## 2018-11-26 MED ORDER — MIDAZOLAM HCL 2 MG/2ML IJ SOLN
INTRAMUSCULAR | Status: AC
  Filled 2018-11-26: qty 2

## 2018-11-26 MED ORDER — OXYCODONE HCL 5 MG PO TABS: 5.0000 mg | ORAL_TABLET | Freq: Once | ORAL | Status: DC | PRN

## 2018-11-26 MED ORDER — EPINEPHRINE 30 MG/30ML IJ SOLN
INTRAMUSCULAR | Status: AC
  Filled 2018-11-26: qty 1

## 2018-11-26 MED ORDER — BUPIVACAINE LIPOSOME 1.3 % IJ SUSP
INTRAMUSCULAR | Status: AC
  Filled 2018-11-26: qty 20

## 2018-11-26 MED ORDER — FLUCONAZOLE 100 MG PO TABS
100.0000 mg | ORAL_TABLET | Freq: Once | ORAL | Status: AC
  Administered 2018-11-26: 100 mg via ORAL
  Filled 2018-11-26: qty 1

## 2018-11-26 MED ORDER — PROPOFOL 500 MG/50ML IV EMUL
INTRAVENOUS | Status: DC | PRN
  Administered 2018-11-26: 25 ug/kg/min via INTRAVENOUS

## 2018-11-26 MED ORDER — PROPOFOL 10 MG/ML IV BOLUS
INTRAVENOUS | Status: DC | PRN
  Administered 2018-11-26: 200 mg via INTRAVENOUS

## 2018-11-26 MED ORDER — LIDOCAINE HCL 1 % IJ SOLN
INTRAVENOUS | Status: DC | PRN
  Administered 2018-11-26: 500 mL

## 2018-11-26 MED ORDER — SODIUM CHLORIDE 0.9 % IV SOLN
INTRAVENOUS | Status: AC
  Administered 2018-11-26: 16:00:00 via INTRAVENOUS

## 2018-11-26 MED ORDER — FENTANYL CITRATE (PF) 100 MCG/2ML IJ SOLN
25.0000 ug | INTRAMUSCULAR | Status: DC | PRN
  Administered 2018-11-26: 25 ug via INTRAVENOUS
  Administered 2018-11-26: 50 ug via INTRAVENOUS

## 2018-11-26 MED ORDER — CIPROFLOXACIN IN D5W 400 MG/200ML IV SOLN
INTRAVENOUS | Status: AC
  Filled 2018-11-26: qty 200

## 2018-11-26 MED ORDER — DEXAMETHASONE SODIUM PHOSPHATE 4 MG/ML IJ SOLN
INTRAMUSCULAR | Status: DC | PRN
  Administered 2018-11-26: 10 mg via INTRAVENOUS

## 2018-11-26 MED ORDER — LACTATED RINGERS IV SOLN
INTRAVENOUS | Status: DC
  Administered 2018-11-26 (×3): via INTRAVENOUS

## 2018-11-26 MED ORDER — CHLORHEXIDINE GLUCONATE CLOTH 2 % EX PADS: 6.0000 | MEDICATED_PAD | Freq: Once | CUTANEOUS | Status: DC

## 2018-11-26 MED ORDER — OXYCODONE HCL 5 MG/5ML PO SOLN: 5.0000 mg | Freq: Once | ORAL | Status: DC | PRN

## 2018-11-26 MED ORDER — LIDOCAINE 2% (20 MG/ML) 5 ML SYRINGE
INTRAMUSCULAR | Status: AC
  Filled 2018-11-26: qty 5

## 2018-11-26 MED ORDER — SODIUM CHLORIDE 0.9 % IV BOLUS
500.0000 mL | Freq: Once | INTRAVENOUS | Status: AC
  Administered 2018-11-26: 500 mL via INTRAVENOUS

## 2018-11-26 MED ORDER — LIDOCAINE-EPINEPHRINE 1 %-1:100000 IJ SOLN
INTRAMUSCULAR | Status: AC
  Filled 2018-11-26: qty 2

## 2018-11-26 MED ORDER — ROCURONIUM BROMIDE 50 MG/5ML IV SOSY
PREFILLED_SYRINGE | INTRAVENOUS | Status: AC
  Filled 2018-11-26: qty 5

## 2018-11-26 MED ORDER — ROCURONIUM BROMIDE 100 MG/10ML IV SOLN
INTRAVENOUS | Status: DC | PRN
  Administered 2018-11-26: 50 mg via INTRAVENOUS

## 2018-11-26 MED ORDER — SCOPOLAMINE 1 MG/3DAYS TD PT72: 1.0000 | MEDICATED_PATCH | Freq: Once | TRANSDERMAL | Status: DC | PRN

## 2018-11-26 MED ORDER — SODIUM CHLORIDE (PF) 0.9 % IJ SOLN
INTRAMUSCULAR | Status: AC
  Filled 2018-11-26: qty 10

## 2018-11-26 MED ORDER — KETOROLAC TROMETHAMINE 15 MG/ML IJ SOLN
15.0000 mg | Freq: Four times a day (QID) | INTRAMUSCULAR | Status: DC | PRN
  Administered 2018-11-26 (×2): 15 mg via INTRAVENOUS
  Filled 2018-11-26 (×2): qty 1

## 2018-11-26 MED ORDER — PROMETHAZINE HCL 25 MG/ML IJ SOLN
INTRAMUSCULAR | Status: AC
  Filled 2018-11-26: qty 1

## 2018-11-26 MED ORDER — SUFENTANIL CITRATE 50 MCG/ML IV SOLN
INTRAVENOUS | Status: DC | PRN
  Administered 2018-11-26: 5 ug via INTRAVENOUS
  Administered 2018-11-26: 10 ug via INTRAVENOUS
  Administered 2018-11-26: 5 ug via INTRAVENOUS

## 2018-11-26 MED ORDER — PHENYLEPHRINE 40 MCG/ML (10ML) SYRINGE FOR IV PUSH (FOR BLOOD PRESSURE SUPPORT)
PREFILLED_SYRINGE | INTRAVENOUS | Status: AC
  Filled 2018-11-26: qty 10

## 2018-11-26 MED ORDER — ONDANSETRON HCL 4 MG/2ML IJ SOLN
INTRAMUSCULAR | Status: AC
  Filled 2018-11-26: qty 2

## 2018-11-26 MED ORDER — SUFENTANIL CITRATE 50 MCG/ML IV SOLN
INTRAVENOUS | Status: AC
  Filled 2018-11-26: qty 1

## 2018-11-26 MED ORDER — SENNA 8.6 MG PO TABS
1.0000 | ORAL_TABLET | Freq: Two times a day (BID) | ORAL | Status: DC
  Administered 2018-11-26: 8.6 mg via ORAL
  Filled 2018-11-26: qty 1

## 2018-11-26 MED ORDER — ONDANSETRON 4 MG PO TBDP
4.0000 mg | ORAL_TABLET | Freq: Four times a day (QID) | ORAL | Status: DC | PRN
  Administered 2018-11-27: 4 mg via ORAL
  Filled 2018-11-26: qty 1

## 2018-11-26 MED ORDER — DIPHENHYDRAMINE HCL 12.5 MG/5ML PO ELIX: 12.5000 mg | ORAL_SOLUTION | Freq: Four times a day (QID) | ORAL | Status: DC | PRN

## 2018-11-26 MED ORDER — ONDANSETRON HCL 4 MG/2ML IJ SOLN
INTRAMUSCULAR | Status: DC | PRN
  Administered 2018-11-26: 4 mg via INTRAVENOUS

## 2018-11-26 MED ORDER — DIPHENHYDRAMINE HCL 50 MG/ML IJ SOLN: 12.5000 mg | Freq: Four times a day (QID) | INTRAMUSCULAR | Status: DC | PRN

## 2018-11-26 SURGICAL SUPPLY — 68 items
ADH SKN CLS APL DERMABOND .7 (GAUZE/BANDAGES/DRESSINGS) ×4
APL PRP STRL LF DISP 70% ISPRP (MISCELLANEOUS) ×3
APPLIER CLIP 9.375 MED OPEN (MISCELLANEOUS) ×2
APR CLP MED 9.3 20 MLT OPN (MISCELLANEOUS) ×1
BAG DECANTER FOR FLEXI CONT (MISCELLANEOUS) ×2 IMPLANT
BINDER ABDOMINAL 10 UNV 27-48 (MISCELLANEOUS) IMPLANT
BINDER ABDOMINAL 12 ML 46-62 (SOFTGOODS) ×2 IMPLANT
BIOPATCH RED 1 DISK 7.0 (GAUZE/BANDAGES/DRESSINGS) IMPLANT
BLADE HEX COATED 2.75 (ELECTRODE) ×2 IMPLANT
BLADE SURG 10 STRL SS (BLADE) ×3 IMPLANT
BLADE SURG 11 STRL SS (BLADE) IMPLANT
BLADE SURG 15 STRL LF DISP TIS (BLADE) IMPLANT
BLADE SURG 15 STRL SS (BLADE) ×2
BNDG GAUZE ELAST 4 BULKY (GAUZE/BANDAGES/DRESSINGS) IMPLANT
CANISTER SUCTION 2500CC (MISCELLANEOUS) ×2 IMPLANT
CHLORAPREP W/TINT 26 (MISCELLANEOUS) ×4 IMPLANT
CLIP APPLIE 9.375 MED OPEN (MISCELLANEOUS) ×1 IMPLANT
COVER WAND RF STERILE (DRAPES) IMPLANT
DERMABOND ADVANCED (GAUZE/BANDAGES/DRESSINGS) ×4
DERMABOND ADVANCED .7 DNX12 (GAUZE/BANDAGES/DRESSINGS) ×2 IMPLANT
DRAIN CHANNEL 15F RND FF W/TCR (WOUND CARE) ×4 IMPLANT
DRAIN CHANNEL 19F RND (DRAIN) IMPLANT
DRAPE LAPAROSCOPIC ABDOMINAL (DRAPES) ×1 IMPLANT
DRSG PAD ABDOMINAL 8X10 ST (GAUZE/BANDAGES/DRESSINGS) ×7 IMPLANT
ELECT BLADE 4.0 EZ CLEAN MEGAD (MISCELLANEOUS) ×2
ELECT COATED BLADE 2.86 ST (ELECTRODE) IMPLANT
ELECT REM PT RETURN 9FT ADLT (ELECTROSURGICAL) ×2
ELECTRODE BLDE 4.0 EZ CLN MEGD (MISCELLANEOUS) IMPLANT
ELECTRODE REM PT RTRN 9FT ADLT (ELECTROSURGICAL) ×1 IMPLANT
EVACUATOR SILICONE 100CC (DRAIN) ×2 IMPLANT
GAUZE SPONGE 4X4 12PLY STRL (GAUZE/BANDAGES/DRESSINGS) ×1 IMPLANT
GAUZE XEROFORM 1X8 LF (GAUZE/BANDAGES/DRESSINGS) ×1 IMPLANT
GLOVE BIO SURGEON STRL SZ 6.5 (GLOVE) ×10 IMPLANT
GOWN STRL REUS W/ TWL LRG LVL3 (GOWN DISPOSABLE) ×2 IMPLANT
GOWN STRL REUS W/TWL LRG LVL3 (GOWN DISPOSABLE) ×6
LINER CANISTER 1000CC FLEX (MISCELLANEOUS) ×1 IMPLANT
NDL HYPO 25X1 1.5 SAFETY (NEEDLE) IMPLANT
NEEDLE HYPO 25X1 1.5 SAFETY (NEEDLE) ×2 IMPLANT
NS IRRIG 1000ML POUR BTL (IV SOLUTION) ×2 IMPLANT
PACK BASIN DAY SURGERY FS (CUSTOM PROCEDURE TRAY) ×2 IMPLANT
PENCIL BUTTON HOLSTER BLD 10FT (ELECTRODE) ×2 IMPLANT
PIN SAFETY STERILE (MISCELLANEOUS) ×2 IMPLANT
SHEET MEDIUM DRAPE 40X70 STRL (DRAPES) ×4 IMPLANT
SLEEVE SCD COMPRESS KNEE MED (MISCELLANEOUS) ×2 IMPLANT
SPONGE LAP 18X18 RF (DISPOSABLE) ×5 IMPLANT
STAPLER VISISTAT 35W (STAPLE) ×2 IMPLANT
STRIP SUTURE WOUND CLOSURE 1/2 (SUTURE) IMPLANT
SUT ETHILON 2 0 FS 18 (SUTURE) ×4 IMPLANT
SUT MNCRL AB 4-0 PS2 18 (SUTURE) ×8 IMPLANT
SUT MON AB 3-0 SH 27 (SUTURE) ×6
SUT MON AB 3-0 SH27 (SUTURE) IMPLANT
SUT MON AB 4-0 PS1 27 (SUTURE) IMPLANT
SUT MON AB 5-0 PS2 18 (SUTURE) ×5 IMPLANT
SUT PDS AB 0 CT 36 (SUTURE) ×4 IMPLANT
SUT PLAIN 5 0 P 3 18 (SUTURE) IMPLANT
SUT SILK 3 0 PS 1 (SUTURE) ×2 IMPLANT
SUT VLOC 180 0 24IN GS25 (SUTURE) ×2 IMPLANT
SYR BULB IRRIGATION 50ML (SYRINGE) ×2 IMPLANT
SYR CONTROL 10ML LL (SYRINGE) ×1 IMPLANT
TOWEL GREEN STERILE FF (TOWEL DISPOSABLE) ×4 IMPLANT
TRAY FOL W/BAG SLVR 16FR STRL (SET/KITS/TRAYS/PACK) IMPLANT
TRAY FOLEY W/BAG SLVR 14FR LF (SET/KITS/TRAYS/PACK) ×1 IMPLANT
TRAY FOLEY W/BAG SLVR 16FR LF (SET/KITS/TRAYS/PACK)
TUBE CONNECTING 20X1/4 (TUBING) ×2 IMPLANT
TUBING INFILTRATION IT-10001 (TUBING) ×1 IMPLANT
TUBING SET GRADUATE ASPIR 12FT (MISCELLANEOUS) ×1 IMPLANT
UNDERPAD 30X30 (UNDERPADS AND DIAPERS) ×4 IMPLANT
YANKAUER SUCT BULB TIP NO VENT (SUCTIONS) ×2 IMPLANT

## 2018-11-26 NOTE — Anesthesia Procedure Notes (Signed)
Procedure Name: Intubation Date/Time: 11/26/2018 7:37 AM Performed by: Willa Frater, CRNA Pre-anesthesia Checklist: Patient identified, Emergency Drugs available, Suction available and Patient being monitored Patient Re-evaluated:Patient Re-evaluated prior to induction Oxygen Delivery Method: Circle system utilized Preoxygenation: Pre-oxygenation with 100% oxygen Induction Type: IV induction Ventilation: Mask ventilation without difficulty Grade View: Grade II Tube type: Oral Tube size: 7.0 mm Number of attempts: 1 Airway Equipment and Method: Stylet and Oral airway Placement Confirmation: ETT inserted through vocal cords under direct vision,  positive ETCO2 and breath sounds checked- equal and bilateral Secured at: 23 cm Tube secured with: Tape Dental Injury: Teeth and Oropharynx as per pre-operative assessment

## 2018-11-26 NOTE — Interval H&P Note (Signed)
History and Physical Interval Note:  11/26/2018 6:54 AM  Kristina [name]  has presented today for surgery, with the diagnosis of panniculitis.  The various methods of treatment have been discussed with the patient and family. After consideration of risks, benefits and other options for treatment, the patient has consented to  Procedure(s): PANNICULECTOMY (N/A) as a surgical intervention.  The patient's history has been reviewed, patient examined, no change in status, stable for surgery.  I have reviewed the patient's chart and labs.  Questions were answered to the patient's satisfaction.     Loel Lofty Mubarak Bevens

## 2018-11-26 NOTE — Transfer of Care (Signed)
Immediate Anesthesia Transfer of Care Note  Patient: Kristina [name]  Procedure(s) Performed: PANNICULECTOMY (N/A Abdomen) LIPOSUCTION (N/A Abdomen)  Patient Location: PACU  Anesthesia Type:General  Level of Consciousness: awake, alert , oriented and drowsy  Airway & Oxygen Therapy: Patient Spontanous Breathing and Patient connected to face mask oxygen  Post-op Assessment: Report given to RN  Post vital signs: Reviewed and stable  Last Vitals:  Vitals Value Taken Time  BP    Temp    Pulse 85 11/26/2018 10:34 AM  Resp 14 11/26/2018 10:34 AM  SpO2 97 % 11/26/2018 10:34 AM  Vitals shown include unvalidated device data.  Last Pain:  Vitals:   11/26/18 0656  TempSrc: Oral  PainSc: 0-No pain      Patients Stated Pain Goal: 2 (76/18/48 5927)  Complications: No apparent anesthesia complications

## 2018-11-26 NOTE — Progress Notes (Signed)
Dr. Marla Roe notified of patients syncope episode while getting up to go to the restroom. Patient stated that she felt "faint and sweaty". Blood pressure was taken and was 70/48. Right JP drain 45cc and left drain 90 cc.  IV fluids administered bolus of NS. Pressure is now 103/68. IV fluids changed to 150 x 4 hours per MD. Will call to update Dr. Marla Roe at 1800. Will continue to closely monitor patient. Setzer, Marchelle Folks

## 2018-11-26 NOTE — Anesthesia Procedure Notes (Signed)
Performed by: Micheline Markes D, CRNA       

## 2018-11-26 NOTE — Progress Notes (Signed)
Pt stated while I was giving her pain medication that she was nauseated.She stated it again that she felt like she was going to throw up. Told ok for her to throw up. Pt said no it isn't because I have a sleeve and it is really bad when I throw up. Told her I was sorry, gave her Phenergan IV, after giving it , once again she said she need medication for nausea. Explained it was going in at the present time. Pt would awaken from snoring and sleeping, states pain is 6. Explained we are going to her room soon , "I  Need a cold cloth on my head. Placed washcloth on forehead, falling back asleep.

## 2018-11-26 NOTE — Progress Notes (Signed)
Patients blood pressure 99/66, HR 82, 96% on room air. Patient complaining of not feeling well. JP drains checked and right side 15cc emptied and left side 60 cc. MD notified. Hemoglobin checked and was 11.5. No new orders received at this time. Will continue to closely monitor patient.Setzer, Marchelle Folks

## 2018-11-26 NOTE — Op Note (Signed)
DATE OF OPERATION: 11/26/2018  LOCATION: [facility]  PREOPERATIVE DIAGNOSIS: Panniculitis  POSTOPERATIVE DIAGNOSIS: Same  PROCEDURE: panniculectomy 4645 gm  SURGEON: Claire Sanger Dillingham, DO  ASSISTANT: Bonita Cox, RNFA  EBL: 150 cc  CONDITION: Stable  DRAINS:   Two  COMPLICATIONS: None  INDICATION: The patient, Kristina [name], is a 48 y.o. female born on 1971/02/25, is here for treatment of panniculitis that has been a chronic problem.  She has a 100 pound weight loss over the past 2 years.   PROCEDURE DETAILS:  The patient was seen prior to surgery and marked.  The IV antibiotics were given. The patient was taken to the operating room and given a general anesthetic. A standard time out was performed and all information was confirmed by those in the room. SCDs were placed.   A foley was placed.  The patient was prepped and draped.  Local with epinepherine was injected into the incision lines.  The lines were marked and agreed by the patient preoperatively.  The pubic midline was marked with a stitch.  The #10 blade was used to make the lower incision that incorporated the lower well healed scar.  The bovie was used to dissect to the abdominal fascia.  The larger vessels were clipped.  The dissection was taken laterally and superiorly to abut the umbilical stalk but no include it.  This helped to release the pannus.  There was some bands of tight tissue that were released as well.  Hemostasis was achieved with the electrocautery.  The area was irrigated.  The 3-0 Monocryl was used to re-approximate the deep fatty layer.  The 4-0 Monocryl was then used for the deep dermal layer.  The drains were placed and secured with the 4-0 Silk.  The tumescent was placed in the flanks and pubic area.  Liposuction was performed to improve contour and decrease the asymmetry of the abdomen and prominence of the pubic area. The 5-0 Monocryl was placed as a running subcuticular stitch.  Derma bond was applied.  The foley was removed at the end of the case.  ABDs and an abdominal binder was placed.  The patient was allowed to wake up and taken to recovery room in stable condition at the end of the case. The family was notified at the end of the case.   The advanced practice practitioner (APP) assisted throughout the case.  The APP was essential in retraction and counter traction when needed to make the case progress smoothly.  This retraction and assistance made it possible to see the tissue plans for the procedure.  The assistance was needed for blood control, tissue re-approximation and assisted with closure of the incision site.

## 2018-11-27 ENCOUNTER — Encounter (HOSPITAL_BASED_OUTPATIENT_CLINIC_OR_DEPARTMENT_OTHER): Payer: Self-pay | Admitting: Plastic Surgery

## 2018-11-27 DIAGNOSIS — M793 Panniculitis, unspecified: Secondary | ICD-10-CM | POA: Diagnosis not present

## 2018-11-27 NOTE — Discharge Summary (Signed)
Physician Discharge Summary  Patient ID: Kristina [name] MRN: [id number] DOB/AGE: January 02, 1971 48 y.o.  Admit date: 11/26/2018 Discharge date: 11/27/2018  Admission Diagnoses: panniculitis  Discharge Diagnoses:  Active Problems:   Panniculitis   Discharged Condition: good  Hospital Course: The patient was taken to the OR and underwent a panniculectomy. She did well for surgery and was managed in the recovery care center.  She had a few episodes of hypotension but was asymtomatic.  She was eating, walking and using the rest room without difficulty.  She was requesting to be sent home.    Consults: None  Significant Diagnostic Studies: none  Treatments: surgery  Discharge Exam: Blood pressure 95/60, pulse 87, temperature 98.6 F (37 C), resp. rate 18, height 5\' 3"  (1.6 m), weight 106.7 kg, SpO2 96 %. General appearance: alert, cooperative and no distress Incision/Wound:  Disposition: Discharge disposition: 01-Home or Self Care       Discharge Instructions    Call MD for:  difficulty breathing, headache or visual disturbances   Complete by:  As directed    Call MD for:  persistant nausea and vomiting   Complete by:  As directed    Call MD for:  redness, tenderness, or signs of infection (pain, swelling, redness, odor or green/yellow discharge around incision site)   Complete by:  As directed    Call MD for:  severe uncontrolled pain   Complete by:  As directed    Call MD for:  temperature >100.4   Complete by:  As directed    Diet general   Complete by:  As directed    Discharge wound care:   Complete by:  As directed    Drain care   Driving Restrictions   Complete by:  As directed    No driving while on pain meds   Increase activity slowly   Complete by:  As directed    Lifting restrictions   Complete by:  As directed    No heavy lifting     Allergies as of 11/27/2018      Reactions   Ampicillin Anaphylaxis   Has patient had a PCN reaction causing  immediate rash, facial/tongue/throat swelling, SOB or lightheadedness with hypotension: Yes Has patient had a PCN reaction causing severe rash involving mucus membranes or skin necrosis: Yes Has patient had a PCN reaction that required hospitalization Yes Has patient had a PCN reaction occurring within the last 10 years: No If all of the above answers are "NO", then may proceed with Cephalosporin use.   Codeine Hives, Shortness Of Breath   Sulfonamide Derivatives Itching, Rash      Medication List    TAKE these medications   ALBUTEROL IN Inhale into the lungs.   buPROPion 150 MG 12 hr tablet Commonly known as:  Wellbutrin SR Take 1 tablet (150 mg total) by mouth 2 (two) times daily.   dexlansoprazole 60 MG capsule Commonly known as:  DEXILANT Take 1 capsule (60 mg total) by mouth daily before breakfast.   levothyroxine 125 MCG tablet Commonly known as:  SYNTHROID, LEVOTHROID Take 1 tablet (125 mcg total) by mouth daily.   liraglutide 18 MG/3ML Sopn Commonly known as:  VICTOZA Inject 0.1 mLs (0.6 mg total) into the skin daily.   metFORMIN 500 MG tablet Commonly known as:  GLUCOPHAGE Take 1 tablet (500 mg total) by mouth daily with breakfast.   nystatin powder Commonly known as:  MYCOSTATIN/NYSTOP Apply topically 2 (two) times daily.   nystatin-triamcinolone ointment Commonly  known as:  MYCOLOG Apply 1 application topically 2 (two) times daily.   Vitamin D (Ergocalciferol) 1.25 MG (50000 UT) Caps capsule Commonly known as:  DRISDOL Take 1 capsule (50,000 Units total) by mouth every 7 (seven) days.            Discharge Care Instructions  (From admission, onward)         Start     Ordered   11/27/18 0000  Discharge wound care:    Comments:  Drain care   11/27/18 0730         Follow-up Information    Criss Bartles, Loel Lofty, DO In 1 week.   Specialty:  Plastic Surgery Contact information: [address] [place] [id number] [phone]            Signed: Wallace Going 11/27/2018, 7:31 AM

## 2018-11-27 NOTE — Discharge Instructions (Signed)
INSTRUCTIONS FOR AFTER SURGERY ° ° °You are having surgery.  You will likely have some questions about what to expect following your operation.  The following information will help you and your family understand what to expect when you are discharged from the hospital.  Following these guidelines will help ensure a smooth recovery and reduce risks of complications.   °Postoperative instructions include information on: diet, wound care, medications and physical activity. ° °AFTER SURGERY °Expect to go home after the procedure.  In some cases, you may need to spend one night in the hospital for observation. ° °DIET °This surgery does not require a specific diet.  However, I have to mention that the healthier you eat the better your body can start healing. It is important to increasing your protein intake.  This means limiting the foods with sugar and carbohydrates.  Focus on vegetables and some meat.  If you have any liposuction during your procedure be sure to drink water.  If your urine is bright yellow, then it is concentrated, and you need to drink more water.  As a general rule after surgery, you should have 8 ounces of water every hour while awake.  If you find you are persistently nauseated or unable to take in liquids let us know.  NO TOBACCO USE or EXPOSURE.  This will slow your healing process and increase the risk of a wound. ° °WOUND CARE °You can shower the day after surgery if you don't have a drain.  Use fragrance free soap.  Dial, Dove and Ivory are usually mild on the skin. If you have a drain clean with baby wipes until the drain is removed.  If you have steri-strips / tape directly attached to your skin leave them in place. It is OK to get these wet.  No baths, pools or hot tubs for two weeks. °We close your incision to leave the smallest and best-looking scar. No ointment or creams on your incisions until given the go ahead.  Especially not Neosporin (Too many skin reactions with this one).  A few  weeks after surgery you can use Mederma and start massaging the scar. °We ask you to wear your binder or sports bra for the first 6 weeks around the clock, including while sleeping. This provides added comfort and helps reduce the fluid accumulation at the surgery site. ° °ACTIVITY °No heavy lifting until cleared by the doctor.  It is OK to walk and climb stairs. In fact, moving your legs is very important to decrease your risk of a blood clot.  It will also help keep you from getting deconditioned.  Every 1 to 2 hours get up and walk for 5 minutes. This will help with a quicker recovery back to normal.  Let pain be your guide so you don't do too much.  NO, you cannot do the spring cleaning and don't plan on taking care of anyone else.  This is your time for TLC.  °You will be more comfortable if you sleep and rest with your head elevated either with a few pillows under you or in a recliner.  No stomach sleeping for a few months. ° °WORK °Everyone returns to work at different times. As a rough guide, most people take at least 1 - 2 weeks off prior to returning to work. If you need documentation for your job, bring the forms to your postoperative follow up visit. ° °DRIVING °Arrange for someone to bring you home from the hospital.  You   may be able to drive a few days after surgery but not while taking any narcotics or valium. ° °BOWEL MOVEMENTS °Constipation can occur after anesthesia and while taking pain medication.  It is important to stay ahead for your comfort.  We recommend taking Milk of Magnesia (2 tablespoons; twice a day) while taking the pain pills. ° °SEROMA °This is fluid your body tried to put in the surgical site.  This is normal but if it creates tight skinny skin let us know.  It usually decreases in a few weeks. ° °WHEN TO CALL °Call your surgeon's office if any of the following occur: °• Fever 101 degrees F or greater °• Excessive bleeding or fluid from the incision site. °• Pain that increases  over time without aid from the medications °• Redness, warmth, or pus draining from incision sites °• Persistent nausea or inability to take in liquids °• Severe misshapen area that underwent the operation. ° °Post Anesthesia Home Care Instructions ° °Activity: °Get plenty of rest for the remainder of the day. A responsible individual must stay with you for 24 hours following the procedure.  °For the next 24 hours, DO NOT: °-Drive a car °-Operate machinery °-Drink alcoholic beverages °-Take any medication unless instructed by your physician °-Make any legal decisions or sign important papers. ° °Meals: °Start with liquid foods such as gelatin or soup. Progress to regular foods as tolerated. Avoid greasy, spicy, heavy foods. If nausea and/or vomiting occur, drink only clear liquids until the nausea and/or vomiting subsides. Call your physician if vomiting continues. ° °Special Instructions/Symptoms: °Your throat may feel dry or sore from the anesthesia or the breathing tube placed in your throat during surgery. If this causes discomfort, gargle with warm salt water. The discomfort should disappear within 24 hours. ° °If you had a scopolamine patch placed behind your ear for the management of post- operative nausea and/or vomiting: ° °1. The medication in the patch is effective for 72 hours, after which it should be removed.  Wrap patch in a tissue and discard in the trash. Wash hands thoroughly with soap and water. °2. You may remove the patch earlier than 72 hours if you experience unpleasant side effects which may include dry mouth, dizziness or visual disturbances. °3. Avoid touching the patch. Wash your hands with soap and water after contact with the patch. °   ° ° ° ° °JP Drain Totals °· Bring this sheet to all of your post-operative appointments while you have your drains. °· Please measure your drains by CC's or ML's. °· Make sure you drain and measure your JP Drains 2 or 3 times per day. °· At the end of  each day, add up totals for the left side and add up totals for the right side. °   ( 9 am )     ( 3 pm )        ( 9 pm )                °Date L  R  L  R  L  R  Total L/R  °               °               °               °               °               °               °               °               °               °               °               °               ° ° ° ° °

## 2018-12-01 NOTE — Anesthesia Postprocedure Evaluation (Signed)
Anesthesia Post Note  Patient: Kristina [name]  Procedure(s) Performed: PANNICULECTOMY (N/A Abdomen) LIPOSUCTION (N/A Abdomen)     Patient location during evaluation: PACU Anesthesia Type: General Level of consciousness: awake and alert Pain management: pain level controlled Vital Signs Assessment: post-procedure vital signs reviewed and stable Respiratory status: spontaneous breathing, nonlabored ventilation and respiratory function stable Cardiovascular status: blood pressure returned to baseline and stable Postop Assessment: no apparent nausea or vomiting Anesthetic complications: no    Last Vitals:  Vitals:   11/27/18 0630 11/27/18 0730  BP:  (!) 89/55  Pulse: 87 88  Resp:  18  Temp:  36.6 C  SpO2: 96% 97%    Last Pain:  Vitals:   11/26/18 0656  TempSrc: Oral                 Audry Pili

## 2018-12-03 ENCOUNTER — Encounter: Payer: Self-pay | Admitting: Plastic Surgery

## 2018-12-03 ENCOUNTER — Ambulatory Visit (INDEPENDENT_AMBULATORY_CARE_PROVIDER_SITE_OTHER): Payer: Medicare Other | Admitting: Plastic Surgery

## 2018-12-03 ENCOUNTER — Other Ambulatory Visit (INDEPENDENT_AMBULATORY_CARE_PROVIDER_SITE_OTHER): Payer: Self-pay | Admitting: Family Medicine

## 2018-12-03 ENCOUNTER — Other Ambulatory Visit: Payer: Self-pay

## 2018-12-03 VITALS — BP 128/84 | HR 98 | Temp 98.8°F | Ht 63.0 in | Wt 228.0 lb

## 2018-12-03 DIAGNOSIS — E1165 Type 2 diabetes mellitus with hyperglycemia: Secondary | ICD-10-CM

## 2018-12-03 DIAGNOSIS — Z794 Long term (current) use of insulin: Secondary | ICD-10-CM

## 2018-12-03 DIAGNOSIS — F3289 Other specified depressive episodes: Secondary | ICD-10-CM

## 2018-12-03 DIAGNOSIS — M793 Panniculitis, unspecified: Secondary | ICD-10-CM

## 2018-12-03 MED ORDER — PROMETHAZINE HCL 25 MG PO TABS: 25.0000 mg | ORAL_TABLET | Freq: Three times a day (TID) | ORAL | 0 refills | Status: DC | PRN

## 2018-12-03 NOTE — Progress Notes (Signed)
   Subjective:    Patient ID: BRITANY CALLICOTT, female    DOB: 1970/09/23, 48 y.o.   MRN: [id number]  Ms. Thalman is a 48 year old female here for follow-up after undergoing a panniculectomy.  She is doing extremely well.  She has a little bit of bruising as expected from the lipoma.  No sign of infection.  Incisions are all healing nicely at this point.  Drains are working.  She did not have been charged today so we instructed her on how to keep them charged.     Review of Systems  Constitutional: Positive for activity change.  HENT: Negative.   Gastrointestinal: Negative.   Musculoskeletal: Negative.        Objective:   Physical Exam Vitals signs and nursing note reviewed.  Constitutional:      Appearance: Normal appearance.  Cardiovascular:     Rate and Rhythm: Normal rate.  Pulmonary:     Effort: Pulmonary effort is normal.  Neurological:     Mental Status: She is alert. Mental status is at baseline.  Psychiatric:        Mood and Affect: Mood normal.        Behavior: Behavior normal.        Assessment & Plan:  Panniculitis  Continue with the spanks.  Keep area clean. Follow-up in 1 week and we will plan to remove at least one drain.

## 2018-12-04 ENCOUNTER — Encounter: Payer: Self-pay | Admitting: Plastic Surgery

## 2018-12-07 MED ORDER — METFORMIN HCL 500 MG PO TABS: 500.0000 mg | ORAL_TABLET | Freq: Every day | ORAL | 0 refills | Status: DC

## 2018-12-07 MED ORDER — BUPROPION HCL ER (SR) 150 MG PO TB12: 150.0000 mg | ORAL_TABLET | Freq: Two times a day (BID) | ORAL | 0 refills | Status: DC

## 2018-12-10 ENCOUNTER — Ambulatory Visit (INDEPENDENT_AMBULATORY_CARE_PROVIDER_SITE_OTHER): Payer: Medicare Other | Admitting: Plastic Surgery

## 2018-12-10 ENCOUNTER — Encounter: Payer: Self-pay | Admitting: Plastic Surgery

## 2018-12-10 ENCOUNTER — Encounter (INDEPENDENT_AMBULATORY_CARE_PROVIDER_SITE_OTHER): Payer: Self-pay

## 2018-12-10 ENCOUNTER — Other Ambulatory Visit: Payer: Self-pay

## 2018-12-10 VITALS — BP 136/82 | HR 105 | Temp 98.6°F | Ht 63.0 in | Wt 231.0 lb

## 2018-12-10 DIAGNOSIS — M793 Panniculitis, unspecified: Secondary | ICD-10-CM

## 2018-12-10 NOTE — Progress Notes (Signed)
The patient is a 48 year old female here for follow-up after panniculectomy.  She is doing well.  She feels like she might be retaining fluid because her weight is not 10 pounds lighter from after surgery.  She looks good I do not see any excess swelling.  There is no sign of cellulitis.  The drains are working well.  Incision is intact and appears to be healing nicely.  The right drain was removed today.  Hopefully will be able to remove the left drain next week.  She can shower now.

## 2018-12-17 ENCOUNTER — Other Ambulatory Visit: Payer: Self-pay

## 2018-12-17 ENCOUNTER — Ambulatory Visit (INDEPENDENT_AMBULATORY_CARE_PROVIDER_SITE_OTHER): Payer: Medicare Other | Admitting: Plastic Surgery

## 2018-12-17 ENCOUNTER — Encounter: Payer: Self-pay | Admitting: Plastic Surgery

## 2018-12-17 VITALS — BP 117/59 | HR 79 | Temp 98.5°F | Ht 63.0 in | Wt 229.0 lb

## 2018-12-17 DIAGNOSIS — M793 Panniculitis, unspecified: Secondary | ICD-10-CM

## 2018-12-17 NOTE — Progress Notes (Signed)
   Subjective:    Patient ID: Kristina [name], female    DOB: 1970/12/11, 48 y.o.   MRN: [id number]  The patient is a 48 year old female here for follow-up after undergoing her panniculectomy.  The incision on the right lateral aspect is opening a little bit and there is some fibrous tissue.  There is no sign of cellulitis or seroma or hematoma.  The drain is working well and is exiting on the left side.  The patient denies any fever.   Review of Systems  Constitutional: Positive for activity change. Negative for appetite change.  HENT: Negative.   Eyes: Negative.   Respiratory: Negative.   Cardiovascular: Negative.   Gastrointestinal: Negative.   Genitourinary: Negative.   Musculoskeletal: Negative.   Skin: Positive for wound.  Psychiatric/Behavioral: Negative.        Objective:   Physical Exam Vitals signs and nursing note reviewed.  Constitutional:      Appearance: Normal appearance.  HENT:     Head: Normocephalic.     Nose: Nose normal.  Cardiovascular:     Rate and Rhythm: Normal rate.  Pulmonary:     Effort: Pulmonary effort is normal. No respiratory distress.  Abdominal:     General: Abdomen is flat. There is no distension.     Palpations: There is no mass.  Neurological:     General: No focal deficit present.     Mental Status: She is alert.  Psychiatric:        Mood and Affect: Mood normal.        Behavior: Behavior normal.        Assessment & Plan:  Panniculitis  Vaseline and dry dressing to the right lateral incision.  Continue with drain care.  Do not increase activity yet.  Walking is fine.  Follow-up in 1 week.

## 2018-12-21 ENCOUNTER — Telehealth: Payer: Self-pay | Admitting: Plastic Surgery

## 2018-12-21 NOTE — Telephone Encounter (Signed)
Patient called to reschedule appointment to tomorrow. Patient answered the following questions: 1.Has the patient traveled outside of the state of [place] at all within the past 6 weeks? No 2.Does the patient have a fever or cough at all? No 3.Has the patient been tested for COVID? Had a positive COVID test? No 4. Has the patient been in contact with anyone who has tested positive? No

## 2018-12-22 ENCOUNTER — Encounter: Payer: Self-pay | Admitting: Plastic Surgery

## 2018-12-22 ENCOUNTER — Ambulatory Visit (INDEPENDENT_AMBULATORY_CARE_PROVIDER_SITE_OTHER): Payer: Medicare Other | Admitting: Plastic Surgery

## 2018-12-22 ENCOUNTER — Encounter (INDEPENDENT_AMBULATORY_CARE_PROVIDER_SITE_OTHER): Payer: Self-pay | Admitting: Family Medicine

## 2018-12-22 ENCOUNTER — Ambulatory Visit (INDEPENDENT_AMBULATORY_CARE_PROVIDER_SITE_OTHER): Payer: Medicare Other | Admitting: Family Medicine

## 2018-12-22 ENCOUNTER — Other Ambulatory Visit: Payer: Self-pay

## 2018-12-22 ENCOUNTER — Ambulatory Visit (INDEPENDENT_AMBULATORY_CARE_PROVIDER_SITE_OTHER): Payer: Self-pay | Admitting: Psychology

## 2018-12-22 VITALS — BP 121/82 | HR 99 | Temp 98.4°F | Ht 63.0 in | Wt 225.8 lb

## 2018-12-22 DIAGNOSIS — F3289 Other specified depressive episodes: Secondary | ICD-10-CM

## 2018-12-22 DIAGNOSIS — E038 Other specified hypothyroidism: Secondary | ICD-10-CM

## 2018-12-22 DIAGNOSIS — Z794 Long term (current) use of insulin: Secondary | ICD-10-CM

## 2018-12-22 DIAGNOSIS — E559 Vitamin D deficiency, unspecified: Secondary | ICD-10-CM

## 2018-12-22 DIAGNOSIS — M793 Panniculitis, unspecified: Secondary | ICD-10-CM

## 2018-12-22 DIAGNOSIS — Z6841 Body Mass Index (BMI) 40.0 and over, adult: Secondary | ICD-10-CM

## 2018-12-22 DIAGNOSIS — E1165 Type 2 diabetes mellitus with hyperglycemia: Secondary | ICD-10-CM | POA: Diagnosis not present

## 2018-12-22 MED ORDER — METFORMIN HCL 500 MG PO TABS: 500.0000 mg | ORAL_TABLET | Freq: Every day | ORAL | 0 refills | Status: DC

## 2018-12-22 MED ORDER — BUPROPION HCL ER (SR) 150 MG PO TB12: 150.0000 mg | ORAL_TABLET | Freq: Two times a day (BID) | ORAL | 0 refills | Status: DC

## 2018-12-22 MED ORDER — LEVOTHYROXINE SODIUM 125 MCG PO TABS: 125.0000 ug | ORAL_TABLET | Freq: Every day | ORAL | 0 refills | Status: DC

## 2018-12-22 MED ORDER — VITAMIN D (ERGOCALCIFEROL) 1.25 MG (50000 UNIT) PO CAPS: 50000.0000 [IU] | ORAL_CAPSULE | ORAL | 0 refills | Status: DC

## 2018-12-22 NOTE — Progress Notes (Signed)
The patient is a 48 year old female here for follow-up on her.  Overall she is doing very well.  She had some opening of the right incision.  That is stable and does not appear to be opening anymore.  Her drain output has been minimal.  The drain was removed.  Recommend follow up in 2 weeks.  We discussed that she may get some fluid buildup and to call if that happens.

## 2018-12-22 NOTE — Progress Notes (Signed)
Office: [phone]  /  Fax: [phone] TeleHealth Visit:  Kristina [name] has verbally consented to this TeleHealth visit today. The patient is located at home, the provider is located at the [employer] [facility] office. The participants in this visit include the listed provider and patient and provider's assistant. The visit was conducted today via face time.  HPI:   Chief Complaint: OBESITY Kristina [name] is here to discuss her progress with her obesity treatment plan. She is on the keep a food journal with 1150-1350 calories and 85+ grams of protein daily and is following her eating plan approximately 70 % of the time. She states she is walking for 15-20 minutes 7 times per week. Kristina [name] returns to our clinic for the first time since her panniculectomy. Her recovery has not been as hard as she thought it would be. She is staying more on Category 2 versus journaling. She states her blood pressure was 118/82 this morning. We were unable to weigh the patient today for this TeleHealth visit. She feels as if she has lost 7 lbs since her last visit. She has lost 16-23 lbs since starting treatment with [place].  Vitamin D Deficiency Kristina [name] has a diagnosis of vitamin D deficiency. She is currently taking prescription Vit D. She notes fatigue and denies nausea, vomiting or muscle weakness.  Hypothyroidism Kristina [name] has a diagnosis of hypothyroidism. She is on levothyroxine. She denies hot or cold intolerance or palpitations, but does admit to ongoing fatigue.  Diabetes II with Hyperglycemia Kristina [name] has a diagnosis of diabetes type II. Kristina [name] notes occasional feelings of low BGs. She is still on metformin, and denies GI side effects of metformin. Last A1c was 5.5. She has been working on intensive lifestyle modifications including diet, exercise, and weight loss to help control her blood glucose levels.  Depression with Emotional Eating Behaviors Kristina [name] is struggling with emotional eating and using food for  comfort to the extent that it is negatively impacting her health. She often snacks when she is not hungry. Kristina [name] sometimes feels she is out of control and then feels guilty that she made poor food choices. She has been working on behavior modification techniques to help reduce her emotional eating and has been somewhat successful. Her symptoms have improved. She shows no sign of suicidal or homicidal ideations.  Depression screen [facility] 2/9 11/02/2018 10/15/2018 01/21/2018 04/19/2016 01/24/2016  Decreased Interest 2 1 3  0 0  Down, Depressed, Hopeless 1 1 3  0 0  PHQ - 2 Score 3 2 6  0 0  Altered sleeping 3 1 2  - -  Tired, decreased energy 3 1 3  - -  Change in appetite 3 2 3  - -  Feeling bad or failure about yourself  1 1 3  - -  Trouble concentrating 1 1 1  - -  Moving slowly or fidgety/restless 1 0 1 - -  Suicidal thoughts 0 0 0 - -  PHQ-9 Score 15 8 19  - -  Difficult doing work/chores - - Very difficult - -    ASSESSMENT AND PLAN:  Vitamin D deficiency - Plan: VITAMIN D 25 Hydroxy (Vit-D Deficiency, Fractures), Vitamin D, Ergocalciferol, (DRISDOL) 1.25 MG (50000 UT) CAPS capsule  Other specified hypothyroidism - Plan: TSH, levothyroxine (SYNTHROID, LEVOTHROID) 125 MCG tablet  Type 2 diabetes mellitus with hyperglycemia, with long-term current use of insulin (HCC) - Plan: Comprehensive metabolic panel, Hemoglobin A1c, Insulin, random, metFORMIN (GLUCOPHAGE) 500 MG tablet  Other depression - with emotional eating - Plan: buPROPion (WELLBUTRIN SR) 150  MG 12 hr tablet  Class 3 severe obesity with serious comorbidity and body mass index (BMI) of 40.0 to 44.9 in adult, unspecified obesity type ([place])  PLAN:  Vitamin D Deficiency Kristina [name] was informed that low vitamin D levels contributes to fatigue and are associated with obesity, breast, and colon cancer. Madhavi agrees to continue taking prescription Vit D @50 ,000 IU every week #4 and we will refill for 1 month. She will follow up for routine testing of  vitamin D, at least 2-3 times per year. She was informed of the risk of over-replacement of vitamin D and agrees to not increase her dose unless she discusses this with [place] first. We will check Vit D level today. Kristina [name] agrees to follow up with our clinic in 2 weeks.  Hypothyroidism Kristina [name] was informed of the importance of good thyroid control to help with weight loss efforts. She was also informed that supertheraputic thyroid levels are dangerous and will not improve weight loss results. Kristina [name] agrees to continue taking levothyroxine 125 mcg PO q AM #30 and we will refill for 1 month. TSH was order and Kristina [name] agrees to follow up with our clinic in 2 weeks.  Diabetes II with Hyperglycemia Kristina [name] has been given extensive diabetes education by myself today including ideal fasting and post-prandial blood glucose readings, individual ideal Hgb A1c goals and hypoglycemia prevention. We discussed the importance of good blood sugar control to decrease the likelihood of diabetic complications such as nephropathy, neuropathy, limb loss, blindness, coronary artery disease, and death. We discussed the importance of intensive lifestyle modification including diet, exercise and weight loss as the first line treatment for diabetes. Kristina [name] agrees to continue taking metformin 500 mg PO daily #30 and we will refill for 1 month. We will check Hgb A1c, insulin, Vit B12, and CMP today. Kristina [name] agrees to follow up with our clinic in 2 weeks.  Depression with Emotional Eating Behaviors We discussed behavior modification techniques today to help Kristina [name] deal with her emotional eating and depression. Kristina [name] agrees to continue taking Wellbutrin SR 150 mg PO BID #60 and we will refill for 1 month. Kristina [name] agrees to follow up with our clinic in 2 weeks.  Obesity Kristina [name] is currently in the action stage of change. As such, her goal is to continue with weight loss efforts She has agreed to follow the Category 2 plan Kristina [name] has been instructed to  work up to a goal of 150 minutes of combined cardio and strengthening exercise per week for weight loss and overall health benefits. We discussed the following Behavioral Modification Strategies today: increasing lean protein intake, work on meal planning and easy cooking plans, ways to avoid boredom eating, and planning for success   Kristina [name] has agreed to follow up with our clinic in 2 weeks. She was informed of the importance of frequent follow up visits to maximize her success with intensive lifestyle modifications for her multiple health conditions.  ALLERGIES: Allergies  Allergen Reactions  . Ampicillin Anaphylaxis    Has patient had a PCN reaction causing immediate rash, facial/tongue/throat swelling, SOB or lightheadedness with hypotension: Yes Has patient had a PCN reaction causing severe rash involving mucus membranes or skin necrosis: Yes Has patient had a PCN reaction that required hospitalization Yes Has patient had a PCN reaction occurring within the last 10 years: No If all of the above answers are "NO", then may proceed with Cephalosporin use.   . Codeine Hives and Shortness Of Breath  . Sulfonamide Derivatives Itching and Rash  MEDICATIONS: Current Outpatient Medications on File Prior to Visit  Medication Sig Dispense Refill  . ALBUTEROL IN Inhale into the lungs.    [name] dexlansoprazole (DEXILANT) 60 MG capsule Take 1 capsule (60 mg total) by mouth daily before breakfast. 90 capsule 3   No current facility-administered medications on file prior to visit.     PAST MEDICAL HISTORY: Past Medical History:  Diagnosis Date  . Allergic rhinitis   . Anxiety   . Asthma    albuterol PRN  . Bulging disc   . Cancer (HCC)    cervical cancer   . Chronic back pain   . Chronic cough   . Depression   . Diabetes mellitus   . Diverticulosis   . Dyspnea    chronic  . Family history of adverse reaction to anesthesia    pts daughter had reaction to profolol - difficulty with  breathing   . GERD (gastroesophageal reflux disease)   . Heart murmur    no problems, managed by PCP  . High cholesterol   . History of bronchitis   . Hx of adenomatous and sessile serrated colonic polyps   . Hypertension    no meds since wt loss  . Hypothyroidism   . Lumbosacral spondylosis without myelopathy    history bulging disc-chronic pain(level 3 to 8).  . Normal cardiac stress test 02/2015   with normal EF  . Numbness    feet bilat comes and goes   . Obesity   . Obstructive sleep apnea    does not need CPAP since losing wt  . Palpitations   . Pneumonia    last episode 2012  . PONV (postoperative nausea and vomiting)   . Prediabetes   . Restless leg   . Vitamin D deficiency   . Vocal cord dysfunction     PAST SURGICAL HISTORY: Past Surgical History:  Procedure Laterality Date  . ABDOMINAL HYSTERECTOMY  1996  . CHOLECYSTECTOMY  1994  . COLONOSCOPY WITH PROPOFOL N/A 03/10/2015   Procedure: COLONOSCOPY WITH PROPOFOL;  Surgeon: [name], MD;  Location: [facility];  Service: Endoscopy;  Laterality: N/A;  . ESOPHAGOGASTRODUODENOSCOPY N/A 10/27/2013   Procedure: ESOPHAGOGASTRODUODENOSCOPY (EGD);  Surgeon: [name], MD;  Location: [facility];  Service: Endoscopy;  Laterality: N/A;  . KNEE ARTHROSCOPY Right   . LAPAROSCOPIC GASTRIC SLEEVE RESECTION WITH HIATAL HERNIA REPAIR N/A 09/19/2015   Procedure: LAPAROSCOPIC GASTRIC SLEEVE RESECTION WITH HIATAL HERNIA REPAIR;  Surgeon: [name], MD;  Location: [facility];  Service: General;  Laterality: N/A;  . LIPOSUCTION N/A 11/26/2018   Procedure: LIPOSUCTION;  Surgeon: Wallace Going, DO;  Location: [place];  Service: Plastics;  Laterality: N/A;  . NOSE SURGERY  2004  . ovaries removed    . PANNICULECTOMY N/A 11/26/2018   Procedure: PANNICULECTOMY;  Surgeon: Wallace Going, DO;  Location: [place];  Service: Plastics;  Laterality: N/A;  . TUBAL LIGATION    . UPPER GI  ENDOSCOPY  09/19/2015   Procedure: UPPER GI ENDOSCOPY;  Surgeon: [name], MD;  Location: [facility];  Service: General;;    SOCIAL HISTORY: Social History   Tobacco Use  . Smoking status: Never Smoker  . Smokeless tobacco: Never Used  Substance Use Topics  . Alcohol use: No  . Drug use: No    FAMILY HISTORY: Family History  Problem Relation Age of Onset  . Emphysema Mother   . Allergies Mother   . Heart disease Mother   .  Asthma Mother   . Ovarian cancer Mother   . Hypertension Mother   . Diabetes Mother   . High Cholesterol Mother   . Stroke Mother   . Thyroid disease Mother   . Depression Mother   . Anxiety disorder Mother   . Heart disease Father        deceased at age 59 from heart attack  . Sudden death Father   . High Cholesterol Father   . High blood pressure Father   . Stroke Maternal Grandmother   . Heart disease Maternal Grandmother   . Cancer Maternal Grandfather   . Heart disease Paternal Grandmother   . Multiple sclerosis Paternal Grandmother   . Heart attack Paternal Grandfather   . Thyroid disease Brother        died at 22  . Heart attack Brother        died at 60  . COPD Brother   . Colon cancer Brother 26  . Hyperthyroidism Sister   . Hypertension Brother   . Hypertension Daughter   . Allergies Child   . Asthma Son   . Asthma Brother   . Asthma Brother   . Lung cancer Maternal Aunt   . COPD Sister        died at 21  . Sudden death Sister   . Asthma Son     ROS: Review of Systems  Constitutional: Positive for malaise/fatigue and weight loss.  Cardiovascular: Negative for palpitations.  Gastrointestinal: Negative for nausea and vomiting.  Musculoskeletal:       Negative muscle weakness  Endo/Heme/Allergies:       Negative hot/cold intolerance Negative hypoglycemia  Psychiatric/Behavioral: Positive for depression. Negative for suicidal ideas.    PHYSICAL EXAM: Pt in no acute distress  RECENT LABS AND TESTS: BMET    Component  Value Date/Time   NA 140 11/23/2018 1400   NA 140 02/05/2018 1055   K 3.8 11/23/2018 1400   CL 109 11/23/2018 1400   CO2 23 11/23/2018 1400   GLUCOSE 93 11/23/2018 1400   BUN 10 11/23/2018 1400   BUN 10 02/05/2018 1055   CREATININE 0.74 11/23/2018 1400   CREATININE 0.88 02/02/2015 1458   CREATININE 0.88 02/02/2015 1458   CALCIUM 9.5 11/23/2018 1400   GFRNONAA >60 11/23/2018 1400   GFRAA >60 11/23/2018 1400   Lab Results  Component Value Date   HGBA1C 5.5 07/16/2018   HGBA1C 5.4 02/05/2018   HGBA1C 6.3 (H) 09/14/2015   HGBA1C (H) 02/12/2011    6.2 (NOTE)                                                                       According to the ADA Clinical Practice Recommendations for 2011, when HbA1c is used as a screening test:   >=6.5%   Diagnostic of Diabetes Mellitus           (if abnormal result  is confirmed)  5.7-6.4%   Increased risk of developing Diabetes Mellitus  References:Diagnosis and Classification of Diabetes Mellitus,Diabetes KXFG,1829,93(ZJIRC 1):S62-S69 and Standards of Medical Care in         Diabetes - 2011,Diabetes Care,2011,34  (Suppl 1):S11-S61.   HGBA1C 6.2 (H) 02/02/2008   Lab Results  Component Value Date  INSULIN 6.9 07/16/2018   INSULIN 11.0 01/21/2018   CBC    Component Value Date/Time   WBC 5.8 02/05/2018 1055   WBC 3.8 (L) 11/10/2015 0527   RBC 4.43 02/05/2018 1055   RBC 4.46 11/10/2015 0527   HGB 11.5 (L) 11/26/2018 1637   HGB 12.4 02/05/2018 1055   HCT 37.3 02/05/2018 1055   PLT 121 (L) 11/10/2015 0527   MCV 84 02/05/2018 1055   MCH 28.0 02/05/2018 1055   MCH 27.4 11/10/2015 0527   MCHC 33.2 02/05/2018 1055   MCHC 31.3 11/10/2015 0527   RDW 13.9 02/05/2018 1055   LYMPHSABS 2.6 02/05/2018 1055   MONOABS 0.8 11/09/2015 0515   EOSABS 0.1 02/05/2018 1055   BASOSABS 0.0 02/05/2018 1055   Iron/TIBC/Ferritin/ %Sat No results found for: IRON, TIBC, FERRITIN, IRONPCTSAT Lipid Panel     Component Value Date/Time   CHOL 233 (H)  02/05/2018 1055   TRIG 157 (H) 02/05/2018 1055   HDL 51 02/05/2018 1055   CHOLHDL 4.5 02/12/2008 0540   VLDL 25 02/12/2008 0540   LDLCALC 151 (H) 02/05/2018 1055   Hepatic Function Panel     Component Value Date/Time   PROT 6.8 02/05/2018 1055   ALBUMIN 4.2 02/05/2018 1055   AST 16 02/05/2018 1055   ALT 11 02/05/2018 1055   ALKPHOS 110 02/05/2018 1055   BILITOT 0.3 02/05/2018 1055      Component Value Date/Time   TSH 0.481 07/16/2018 1259   TSH 0.593 05/12/2018 1451   TSH 0.329 (L) 03/16/2018 1618      I, Trixie Dredge, am acting as transcriptionist for Ilene Qua, MD  I have reviewed the above documentation for accuracy and completeness, and I agree with the above. - Ilene Qua, MD

## 2018-12-24 ENCOUNTER — Ambulatory Visit: Payer: Self-pay | Admitting: Plastic Surgery

## 2018-12-24 ENCOUNTER — Telehealth (INDEPENDENT_AMBULATORY_CARE_PROVIDER_SITE_OTHER): Payer: Self-pay | Admitting: Psychology

## 2018-12-24 NOTE — Telephone Encounter (Signed)
  Office: [phone]  /  Fax: [phone]  Date of Call: December 24, 2018 Time of Call: 11:34am Duration of Call: 5 minutes Provider: Glennie Isle, PsyD  CONTENT: This provider called Kristina [name] to check-in and schedule an appointment, as the last appointment was canceled by Kristina [name]. Kristina [name] was receptive to scheduling an appointment; therefore, this provider confirmed Kristina [name]'s e-mail address and provided instructions for WebEx appointments. This provider explained an e-mail will be sent with a secure link for the upcoming appointment, and the e-mail will also have an informed consent document for telepsychological services. This provider requested it be completed and returned. Kristina [name] verbally acknowledged understanding that sending the consent document via a Kristina [name] message will make it part of her medical record; therefore, visible to all providers. A brief risk assessment was completed. Kristina [name] denied experiencing suicidal and homicidal ideation, plan, and intent since the last appointment with this provider.   PLAN: Kristina [name] is scheduled for a follow-up appointment via WebEx on December 31, 2018 at 11:30am.

## 2018-12-29 ENCOUNTER — Other Ambulatory Visit: Payer: Self-pay

## 2018-12-29 ENCOUNTER — Ambulatory Visit (INDEPENDENT_AMBULATORY_CARE_PROVIDER_SITE_OTHER): Payer: Medicare Other | Admitting: Plastic Surgery

## 2018-12-29 ENCOUNTER — Encounter: Payer: Self-pay | Admitting: Plastic Surgery

## 2018-12-29 VITALS — BP 109/70 | HR 108 | Temp 98.6°F | Ht 63.0 in | Wt 225.0 lb

## 2018-12-29 DIAGNOSIS — Z9889 Other specified postprocedural states: Secondary | ICD-10-CM

## 2018-12-29 NOTE — Progress Notes (Addendum)
Office: [phone]  /  Fax: [phone]    Date: December 31, 2018   Appointment Start Time: 11:42am Duration: 26 minutes Provider: Glennie Isle, Psy.D. Type of Session: Individual Therapy  Location of Patient: Home Location of Provider: Home  Type of Contact: Telepsychological Visit via [employer] Web[employer]   Session Content:Prior to initiating telepsychological services, Kristina [name] was provided with an informed consent document via e-mail, which included the development of a safety plan (I.e., an emergency contact and emergency resources) in the event of an emergency/crisis. Kristina [name] returned the consent form signed prior to today's appointment via e-mail as this provider's clinic is closed. This provider verbally reviewed the consent form during today's appointment prior to proceeding with the appointment. Notably, a number for her emergency contact and the nearest emergency room were not noted on the signed consent form; therefore, this provider verbally obtained the information. Kristina [name] verbally acknowledged understanding that she is ultimately responsible for understanding her insurance benefits as it relates to reimbursement of telepsychological services. This provider also reviewed confidentiality, as it relates to telepsychological services, as well as the rationale for telepsychological services. More specifically, this provider's clinic is closed for in-person visits due to COVID-19. Therapeutic services will resume to in-person appointments once the clinic re-opens. Kristina [name] expressed understanding regarding the rationale for telepsychological services. In addition, this provider explained the telepsychological services informed consent document would be considered an addendum to the initial consent document. Kristina [name] verbally consented to proceed.   It is important to note this provider called Dlisa at 11:36am, as she did not present for her WebEx appointment. Kristina [name] explained she was experiencing difficulty  connecting; therefore, this provider assisted in troubleshooting. The appointment was initiated at 11:42am. Initially, Kristina [name] was in the same room as her daughter, but was receptive to moving to another part of her home for privacy/confidentiality.   Kristina [name] is a 48 y.o. female presenting via [place] Webex for a follow-up appointment to address the previously established treatment goal of decreasing emotional eating. Prior to proceeding with today's appointment, Kristina [name]'s physical location at the time of this appointment was obtained. Kristina [name] reported she was at home in her bedroom and provided the address. In the event of technical difficulties, Kristina [name] shared a phone number she could be reached at. Kaymarie and this provider participated in today's telepsychological service. Also, Mersadie denied anyone else being present in the room or on the [employer]. This provider verbally administered the PHQ-9 and GAD-7, and conducted a brief check-in. Kristina [name] shared she had surgery on November 26, 2018; therefore, she canceled the last appointment with this provider. She acknowledged emotional eating secondary to stress and boredom. Due to the increased awareness, Kristina [name] noted she has been eating strawberries when triggered to emotionally eat. She noted a plan to focus on maintaining her weight versus weight loss during the pandemic, and discussed engaging in portion control and making better choices since the last appointment with this provider. This provider reviewed pleasurable activities with Kristina [name], including the rationale for engaging in them regularly as it relates to emotional eating and overall well-being. Kristina [name] was agreeable to engaging in one activity a day and additional activities when triggered to eat emotionally. Kristina [name] was receptive to today's session as evidenced by openness to sharing, responsiveness to feedback, and engaging in pleasurable activities. Furthermore, she noted a plan to highlight her options on the handout  provided at the last appointment.   Mental Status Examination:  Appearance: neat Behavior: cooperative Mood: euthymic Affect: mood congruent Speech: normal in  rate, volume, and tone Eye Contact: appropriate Psychomotor Activity: appropriate Thought Process: linear, logical, and goal directed  Content/Perceptual Disturbances: denies suicidal and homicidal ideation, plan, and intent and endorses homicidal ideation, but denies plan and intent  Orientation: time, person, place and purpose of appointment Cognition/Sensorium: memory, attention, language, and fund of knowledge intact  Insight: good Judgment: good  Structured Assessment Results: The Patient Health Questionnaire-9 (PHQ-9) is a self-report measure that assesses symptoms and severity of depression over the course of the last two weeks. Kristina [name] obtained a score of 2 suggesting minimal depression. Kristina [name] finds the endorsed symptoms to be not difficult at all. Depression screen [name] 2/9 12/31/2018  Decreased Interest 0  Down, Depressed, Hopeless 1  PHQ - 2 Score 1  Altered sleeping 0  Tired, decreased energy 1  Change in appetite 0  Feeling bad or failure about yourself  0  Trouble concentrating 0  Moving slowly or fidgety/restless 0  Suicidal thoughts 0  PHQ-9 Score 2  Difficult doing work/chores -    The Generalized Anxiety Disorder-7 (GAD-7) is a brief self-report measure that assesses symptoms of anxiety over the course of the last two weeks. Kristina [name] obtained a score of 0. GAD 7 : Generalized Anxiety Score 12/31/2018  Nervous, Anxious, on Edge 0  Control/stop worrying 0  Worry too much - different things 0  Trouble relaxing 0  Restless 0  Easily annoyed or irritable 0  Afraid - awful might happen 0  Total GAD 7 Score 0  Anxiety Difficulty -   Interventions:  Administration of PHQ-9 and GAD-7 for symptom monitoring Review of content from the previous session Empathic reflections and validation Psychoeducation regarding  pleasurable activities Positive reinforcement Brief chart review Employed supportive psychotherapy interventions today to facilitate reduced distress, and to improve coping skills with identified stressors  DSM-5 Diagnosis: 311 (F32.8) Other Specified Depressive Disorder, Emotional Eating Behaviors  Treatment Goal & Progress: During the initial appointment with this provider, the following treatment goal was established: decrease emotional eating. Kristina [name] has demonstrated progress in her goal as evidenced by increased awareness of hunger patterns and triggers for emotional eating. Despite engaging in emotional eating since the last appointment with this provider, Kristina [name] demonstrates willingness to engage in pleasurable activities to assist with coping.   Plan: Kristina [name] continues to appear able and willing to participate as evidenced by engagement in reciprocal conversation, and asking questions for clarification as appropriate. The next appointment will be scheduled in two weeks, which will be via [employer]. Once this provider's office resumes in-person appointments, Kristina [name] will be notified. The next session will focus on the introduction of mindfulness.

## 2018-12-29 NOTE — Progress Notes (Signed)
The patient is a 48 year old female here for reevaluation of her abdomen. She has been concerned about fluid coming out of the drain site.  There is absolutely no sign of infection.  The incision is healing nicely.  There is no redness there is no fluid wave.  The drain site is starting to heal.  I am not surprised that she is having this much fluid output due to the amount of tissue that was removed.  I offered interventional radiology to put a drain in.  She has decided to wait a little longer.  I think this is a good idea.  I recommend waiting at least 1 more week.  Follow-up in 1 to 2 weeks as needed

## 2018-12-30 LAB — COMPREHENSIVE METABOLIC PANEL
ALT: 11 IU/L (ref 0–32)
AST: 11 IU/L (ref 0–40)
Albumin/Globulin Ratio: 1.6 (ref 1.2–2.2)
Albumin: 3.6 g/dL — ABNORMAL LOW (ref 3.8–4.8)
Alkaline Phosphatase: 113 IU/L (ref 39–117)
BUN/Creatinine Ratio: 18 (ref 9–23)
BUN: 11 mg/dL (ref 6–24)
Bilirubin Total: 0.3 mg/dL (ref 0.0–1.2)
CO2: 23 mmol/L (ref 20–29)
Calcium: 9.4 mg/dL (ref 8.7–10.2)
Chloride: 102 mmol/L (ref 96–106)
Creatinine, Ser: 0.62 mg/dL (ref 0.57–1.00)
GFR calc Af Amer: 124 mL/min/{1.73_m2} (ref >59)
GFR calc non Af Amer: 108 mL/min/{1.73_m2} (ref >59)
Globulin, Total: 2.3 g/dL (ref 1.5–4.5)
Glucose: 90 mg/dL (ref 65–99)
Potassium: 4.2 mmol/L (ref 3.5–5.2)
Sodium: 142 mmol/L (ref 134–144)
Total Protein: 5.9 g/dL — ABNORMAL LOW (ref 6.0–8.5)

## 2018-12-30 LAB — HEMOGLOBIN A1C
Est. average glucose Bld gHb Est-mCnc: 103 mg/dL
Hgb A1c MFr Bld: 5.2 % (ref 4.8–5.6)

## 2018-12-30 LAB — INSULIN, RANDOM: INSULIN: 16.8 u[IU]/mL (ref 2.6–24.9)

## 2018-12-30 LAB — VITAMIN D 25 HYDROXY (VIT D DEFICIENCY, FRACTURES): Vit D, 25-Hydroxy: 23.6 ng/mL — ABNORMAL LOW (ref 30.0–100.0)

## 2018-12-30 LAB — TSH: TSH: 1.02 u[IU]/mL (ref 0.450–4.500)

## 2018-12-31 ENCOUNTER — Other Ambulatory Visit: Payer: Self-pay

## 2018-12-31 ENCOUNTER — Ambulatory Visit (INDEPENDENT_AMBULATORY_CARE_PROVIDER_SITE_OTHER): Payer: Medicare Other | Admitting: Psychology

## 2018-12-31 DIAGNOSIS — F3289 Other specified depressive episodes: Secondary | ICD-10-CM

## 2019-01-04 ENCOUNTER — Telehealth: Payer: Self-pay | Admitting: Plastic Surgery

## 2019-01-04 ENCOUNTER — Encounter (INDEPENDENT_AMBULATORY_CARE_PROVIDER_SITE_OTHER): Payer: Self-pay | Admitting: Family Medicine

## 2019-01-04 NOTE — Telephone Encounter (Signed)
Patient called as she has noticed 3 hard knots above her incision and at the drain site. She does not have pain but it is uncomfortable. She would like a call back to advise on next steps.

## 2019-01-04 NOTE — Telephone Encounter (Addendum)
Called the patient back and informed her that I spoke with Dr. Marla Roe and she stated that the knots are fat necrosis and she would like for her to massage the area, and it will improve.  Patient asked if the knots were going to stay there, and I informed her again that Dr. Marla Roe said by massaging the area it will help.  Patient verbalized understanding and agreed.//AB/CMA

## 2019-01-04 NOTE — Telephone Encounter (Signed)
Please review

## 2019-01-04 NOTE — Telephone Encounter (Signed)
Called and spoke with the patient regarding the message below.  She stated that the knots are on the bottom (R),stomach (L), and drain (L) around the pubic area and runs below the bottom.  Asked her is she still has drainage, and she stated that the drainage has stopped, it stopped last Thursday.  Asked patient when did she notice the knots, and she stated that she noticed them Saturday morning.//AB/CMA

## 2019-01-05 ENCOUNTER — Ambulatory Visit (INDEPENDENT_AMBULATORY_CARE_PROVIDER_SITE_OTHER): Payer: Medicare Other | Admitting: Family Medicine

## 2019-01-05 ENCOUNTER — Encounter (INDEPENDENT_AMBULATORY_CARE_PROVIDER_SITE_OTHER): Payer: Self-pay | Admitting: Family Medicine

## 2019-01-05 ENCOUNTER — Ambulatory Visit: Payer: Self-pay | Admitting: Plastic Surgery

## 2019-01-05 ENCOUNTER — Other Ambulatory Visit: Payer: Self-pay

## 2019-01-05 DIAGNOSIS — F3289 Other specified depressive episodes: Secondary | ICD-10-CM | POA: Diagnosis not present

## 2019-01-05 DIAGNOSIS — Z794 Long term (current) use of insulin: Secondary | ICD-10-CM

## 2019-01-05 DIAGNOSIS — E1165 Type 2 diabetes mellitus with hyperglycemia: Secondary | ICD-10-CM | POA: Diagnosis not present

## 2019-01-05 DIAGNOSIS — Z6839 Body mass index (BMI) 39.0-39.9, adult: Secondary | ICD-10-CM

## 2019-01-06 MED ORDER — GLUCOSE BLOOD VI STRP: ORAL_STRIP | 0 refills | Status: DC

## 2019-01-06 MED ORDER — BUPROPION HCL ER (SR) 150 MG PO TB12: 150.0000 mg | ORAL_TABLET | Freq: Two times a day (BID) | ORAL | 0 refills | Status: DC

## 2019-01-06 MED ORDER — BLOOD GLUCOSE MONITOR KIT: PACK | 0 refills | Status: DC

## 2019-01-06 MED ORDER — METFORMIN HCL 500 MG PO TABS: 500.0000 mg | ORAL_TABLET | Freq: Every day | ORAL | 0 refills | Status: DC

## 2019-01-06 MED ORDER — ONETOUCH ULTRASOFT LANCETS MISC: 0 refills | Status: DC

## 2019-01-11 ENCOUNTER — Telehealth: Payer: Self-pay | Admitting: Plastic Surgery

## 2019-01-11 NOTE — Progress Notes (Signed)
Office: [phone]  /  Fax: [phone] TeleHealth Visit:  Kristina [name] has verbally consented to this TeleHealth visit today. The patient is located at home, the provider is located at the [employer] [facility] office. The participants in this visit include the listed provider and patient. The visit was conducted today via face time.  HPI:   Chief Complaint: OBESITY Kristina [name] is here to discuss her progress with her obesity treatment plan. She is on the Category 2 plan and is following her eating plan approximately 50-60 % of the time. She states she is exercising 0 minutes 0 times per week. Kristina [name] has noticed some areas of fat necrosis or bumps around her incision site. She has not been given the go ahead to start exercising yet. She states her blood pressure is 107/73.  We were unable to weigh the patient today for this TeleHealth visit. She feels as if she has maintained her weight since her last visit. She has lost 16 lbs since starting treatment with [place].  Diabetes II with Hyperglycemia Kristina [name] has a diagnosis of diabetes type II. Kristina [name] states is not checking BGs at home. She notes carbohydrate cravings. She denies hypoglycemia or GI side effects of metformin. Last A1c was 5.2. She has been working on intensive lifestyle modifications including diet, exercise, and weight loss to help control her blood glucose levels.  Depression with Emotional Eating Behaviors Kristina [name] is struggling with emotional eating and using food for comfort to the extent that it is negatively impacting her health. She often snacks when she is not hungry. Kristina [name] sometimes feels she is out of control and then feels guilty that she made poor food choices. She has been working on behavior modification techniques to help reduce her emotional eating and has been somewhat successful. Her blood pressure was previously controlled even on Wellbutrin. She shows no sign of suicidal or homicidal ideations.  Depression screen [facility]  2/9 12/31/2018 11/02/2018 10/15/2018 01/21/2018 04/19/2016  Decreased Interest 0 _0 0  Down, Depressed, Hopeless _1 0  PHQ - 2 Score _2 0  Altered sleeping 0 _3 -  Tired, decreased energy _4 -  Change in appetite 0 _5 -  Feeling bad or failure about yourself  0 _6 -  Trouble concentrating 0 _7 -  Moving slowly or fidgety/restless 0 1 0 1 -  Suicidal thoughts 0 0 0 0 -  PHQ-9 Score _8 -  Difficult doing work/chores - - - Very difficult -    ASSESSMENT AND PLAN:  Type 2 diabetes mellitus with hyperglycemia, with long-term current use of insulin (HCC) - Plan: metFORMIN (GLUCOPHAGE) 500 MG tablet, glucose blood test strip, Lancets (ONETOUCH ULTRASOFT) lancets, blood glucose meter kit and supplies KIT  Other depression - with emotional eating - Plan: buPROPion (WELLBUTRIN SR) 150 MG 12 hr tablet  Class 2 severe obesity with serious comorbidity and body mass index (BMI) of 39.0 to 39.9 in adult, unspecified obesity type (HCC)  PLAN:  Diabetes II with Hyperglycemia Kristina [name] has been given extensive diabetes education by myself today including ideal fasting and post-prandial blood glucose readings, individual ideal Hgb A1c goals and hypoglycemia prevention. We discussed the importance of good blood sugar control to decrease the likelihood of diabetic complications such as nephropathy, neuropathy, limb loss, blindness, coronary artery disease, and death. We discussed the importance of intensive lifestyle modification including diet, exercise  and weight loss as the first line treatment for diabetes. Kallie agrees to continue taking metformin 500 mg PO q AM #30 and we will refill for 1 month, and One Touch glucometer kit, glucose blood test strips, and lancets with no refills. Kristina [name] agrees to follow up with our clinic in 2 weeks.   Depression with Emotional Eating Behaviors We discussed behavior modification techniques today to help Kristina [name] deal with her emotional eating and  depression. Kristina [name] agrees to continue taking Wellbutrin SR 150 mg PO BID #60 and we will refill for 1 month. Kristina [name] agrees to follow up with our clinic in 2 weeks.  Obesity Kristina [name] is currently in the action stage of change. As such, her goal is to continue with weight loss efforts She has agreed to follow the Category 2 plan Kristina [name] has been instructed to work up to a goal of 150 minutes of combined cardio and strengthening exercise per week or she is to walk almost daily for mental health, weight loss and overall health benefits. We discussed the following Behavioral Modification Strategies today: increasing lean protein intake, increasing vegetables, work on meal planning and easy cooking plans, emotional eating strategies, ways to avoid boredom eating, avoiding temptations, better snacking choices, and planning for success   Kristina [name] has agreed to follow up with our clinic in 2 weeks. She was informed of the importance of frequent follow up visits to maximize her success with intensive lifestyle modifications for her multiple health conditions.  ALLERGIES: Allergies  Allergen Reactions  . Ampicillin Anaphylaxis    Has patient had a PCN reaction causing immediate rash, facial/tongue/throat swelling, SOB or lightheadedness with hypotension: Yes Has patient had a PCN reaction causing severe rash involving mucus membranes or skin necrosis: Yes Has patient had a PCN reaction that required hospitalization Yes Has patient had a PCN reaction occurring within the last 10 years: No If all of the above answers are "NO", then may proceed with Cephalosporin use.   . Codeine Hives and Shortness Of Breath  . Sulfonamide Derivatives Itching and Rash    MEDICATIONS: Current Outpatient Medications on File Prior to Visit  Medication Sig Dispense Refill  . ALBUTEROL IN Inhale into the lungs.    . cyanocobalamin (,VITAMIN B-12,) 1000 MCG/ML injection Inject 26m once a month.    . dexlansoprazole (DEXILANT) 60  MG capsule Take 1 capsule (60 mg total) by mouth daily before breakfast. 90 capsule 3  . levothyroxine (SYNTHROID, LEVOTHROID) 125 MCG tablet Take 1 tablet (125 mcg total) by mouth daily. 30 tablet 0  . Vitamin D, Ergocalciferol, (DRISDOL) 1.25 MG (50000 UT) CAPS capsule Take 1 capsule (50,000 Units total) by mouth every 7 (seven) days. 4 capsule 0   No current facility-administered medications on file prior to visit.     PAST MEDICAL HISTORY: Past Medical History:  Diagnosis Date  . Allergic rhinitis   . Anxiety   . Asthma    albuterol PRN  . Bulging disc   . Cancer (HCC)    cervical cancer   . Chronic back pain   . Chronic cough   . Depression   . Diabetes mellitus   . Diverticulosis   . Dyspnea    chronic  . Family history of adverse reaction to anesthesia    pts daughter had reaction to profolol - difficulty with breathing   . GERD (gastroesophageal reflux disease)   . Heart murmur    no problems, managed by PCP  . High cholesterol   . History  of bronchitis   . Hx of adenomatous and sessile serrated colonic polyps   . Hypertension    no meds since wt loss  . Hypothyroidism   . Lumbosacral spondylosis without myelopathy    history bulging disc-chronic pain(level 3 to 8).  . Normal cardiac stress test 02/2015   with normal EF  . Numbness    feet bilat comes and goes   . Obesity   . Obstructive sleep apnea    does not need CPAP since losing wt  . Palpitations   . Pneumonia    last episode 2012  . PONV (postoperative nausea and vomiting)   . Prediabetes   . Restless leg   . Vitamin D deficiency   . Vocal cord dysfunction     PAST SURGICAL HISTORY: Past Surgical History:  Procedure Laterality Date  . ABDOMINAL HYSTERECTOMY  1996  . CHOLECYSTECTOMY  1994  . COLONOSCOPY WITH PROPOFOL N/A 03/10/2015   Procedure: COLONOSCOPY WITH PROPOFOL;  Surgeon: [name], MD;  Location: [facility];  Service: Endoscopy;  Laterality: N/A;  . ESOPHAGOGASTRODUODENOSCOPY  N/A 10/27/2013   Procedure: ESOPHAGOGASTRODUODENOSCOPY (EGD);  Surgeon: [name], MD;  Location: [facility];  Service: Endoscopy;  Laterality: N/A;  . KNEE ARTHROSCOPY Right   . LAPAROSCOPIC GASTRIC SLEEVE RESECTION WITH HIATAL HERNIA REPAIR N/A 09/19/2015   Procedure: LAPAROSCOPIC GASTRIC SLEEVE RESECTION WITH HIATAL HERNIA REPAIR;  Surgeon: [name], MD;  Location: [facility];  Service: General;  Laterality: N/A;  . LIPOSUCTION N/A 11/26/2018   Procedure: LIPOSUCTION;  Surgeon: Wallace Going, DO;  Location: [place];  Service: Plastics;  Laterality: N/A;  . NOSE SURGERY  2004  . ovaries removed    . PANNICULECTOMY N/A 11/26/2018   Procedure: PANNICULECTOMY;  Surgeon: Wallace Going, DO;  Location: [place];  Service: Plastics;  Laterality: N/A;  . TUBAL LIGATION    . UPPER GI ENDOSCOPY  09/19/2015   Procedure: UPPER GI ENDOSCOPY;  Surgeon: [name], MD;  Location: [facility];  Service: General;;    SOCIAL HISTORY: Social History   Tobacco Use  . Smoking status: Never Smoker  . Smokeless tobacco: Never Used  Substance Use Topics  . Alcohol use: No  . Drug use: No    FAMILY HISTORY: Family History  Problem Relation Age of Onset  . Emphysema Mother   . Allergies Mother   . Heart disease Mother   . Asthma Mother   . Ovarian cancer Mother   . Hypertension Mother   . Diabetes Mother   . High Cholesterol Mother   . Stroke Mother   . Thyroid disease Mother   . Depression Mother   . Anxiety disorder Mother   . Heart disease Father        deceased at age 54 from heart attack  . Sudden death Father   . High Cholesterol Father   . High blood pressure Father   . Stroke Maternal Grandmother   . Heart disease Maternal Grandmother   . Cancer Maternal Grandfather   . Heart disease Paternal Grandmother   . Multiple sclerosis Paternal Grandmother   . Heart attack Paternal Grandfather   . Thyroid disease Brother        died at 34  .  Heart attack Brother        died at 80  . COPD Brother   . Colon cancer Brother 71  . Hyperthyroidism Sister   . Hypertension Brother   . Hypertension Daughter   .  Allergies Child   . Asthma Son   . Asthma Brother   . Asthma Brother   . Lung cancer Maternal Aunt   . COPD Sister        died at 74  . Sudden death Sister   . Asthma Son     ROS: Review of Systems  Constitutional: Negative for weight loss.  Endo/Heme/Allergies:       Negative hypoglycemia  Psychiatric/Behavioral: Positive for depression. Negative for suicidal ideas.    PHYSICAL EXAM: Pt in no acute distress  RECENT LABS AND TESTS: BMET    Component Value Date/Time   NA 142 12/29/2018 1032   K 4.2 12/29/2018 1032   CL 102 12/29/2018 1032   CO2 23 12/29/2018 1032   GLUCOSE 90 12/29/2018 1032   GLUCOSE 93 11/23/2018 1400   BUN 11 12/29/2018 1032   CREATININE 0.62 12/29/2018 1032   CREATININE 0.88 02/02/2015 1458   CREATININE 0.88 02/02/2015 1458   CALCIUM 9.4 12/29/2018 1032   GFRNONAA 108 12/29/2018 1032   GFRAA 124 12/29/2018 1032   Lab Results  Component Value Date   HGBA1C 5.2 12/29/2018   HGBA1C 5.5 07/16/2018   HGBA1C 5.4 02/05/2018   HGBA1C 6.3 (H) 09/14/2015   HGBA1C (H) 02/12/2011    6.2 (NOTE)                                                                       According to the ADA Clinical Practice Recommendations for 2011, when HbA1c is used as a screening test:   >=6.5%   Diagnostic of Diabetes Mellitus           (if abnormal result  is confirmed)  5.7-6.4%   Increased risk of developing Diabetes Mellitus  References:Diagnosis and Classification of Diabetes Mellitus,Diabetes HQIO,9629,52(WUXLK 1):S62-S69 and Standards of Medical Care in         Diabetes - 2011,Diabetes Care,2011,34  (Suppl 1):S11-S61.   Lab Results  Component Value Date   INSULIN 16.8 12/29/2018   INSULIN 6.9 07/16/2018   INSULIN 11.0 01/21/2018   CBC    Component Value Date/Time   WBC 5.8 02/05/2018 1055    WBC 3.8 (L) 11/10/2015 0527   RBC 4.43 02/05/2018 1055   RBC 4.46 11/10/2015 0527   HGB 11.5 (L) 11/26/2018 1637   HGB 12.4 02/05/2018 1055   HCT 37.3 02/05/2018 1055   PLT 121 (L) 11/10/2015 0527   MCV 84 02/05/2018 1055   MCH 28.0 02/05/2018 1055   MCH 27.4 11/10/2015 0527   MCHC 33.2 02/05/2018 1055   MCHC 31.3 11/10/2015 0527   RDW 13.9 02/05/2018 1055   LYMPHSABS 2.6 02/05/2018 1055   MONOABS 0.8 11/09/2015 0515   EOSABS 0.1 02/05/2018 1055   BASOSABS 0.0 02/05/2018 1055   Iron/TIBC/Ferritin/ %Sat No results found for: IRON, TIBC, FERRITIN, IRONPCTSAT Lipid Panel     Component Value Date/Time   CHOL 233 (H) 02/05/2018 1055   TRIG 157 (H) 02/05/2018 1055   HDL 51 02/05/2018 1055   CHOLHDL 4.5 02/12/2008 0540   VLDL 25 02/12/2008 0540   LDLCALC 151 (H) 02/05/2018 1055   Hepatic Function Panel     Component Value Date/Time   PROT 5.9 (L) 12/29/2018 1032   ALBUMIN 3.6 (  L) 12/29/2018 1032   AST 11 12/29/2018 1032   ALT 11 12/29/2018 1032   ALKPHOS 113 12/29/2018 1032   BILITOT 0.3 12/29/2018 1032      Component Value Date/Time   TSH 1.020 12/29/2018 1032   TSH 0.481 07/16/2018 1259   TSH 0.593 05/12/2018 1451      I, Trixie Dredge, am acting as transcriptionist for Ilene Qua, MD  I have reviewed the above documentation for accuracy and completeness, and I agree with the above. - Ilene Qua, MD

## 2019-01-11 NOTE — Telephone Encounter (Signed)
Called patient to confirm appointment scheduled for tomorrow. Patient answered the following questions: °1.Has the patient traveled outside of the state of [place] at all within the past 6 weeks? No °2.Does the patient have a fever or cough at all? No °3.Has the patient been tested for COVID? Had a positive COVID test? No °4. Has the patient been in contact with anyone who has tested positive? No ° °

## 2019-01-12 ENCOUNTER — Other Ambulatory Visit: Payer: Self-pay

## 2019-01-12 ENCOUNTER — Ambulatory Visit (INDEPENDENT_AMBULATORY_CARE_PROVIDER_SITE_OTHER): Payer: Medicare Other | Admitting: Plastic Surgery

## 2019-01-12 ENCOUNTER — Encounter: Payer: Self-pay | Admitting: Plastic Surgery

## 2019-01-12 VITALS — BP 121/85 | HR 92 | Temp 97.7°F | Ht 63.0 in | Wt 229.4 lb

## 2019-01-12 DIAGNOSIS — Z9889 Other specified postprocedural states: Secondary | ICD-10-CM

## 2019-01-12 NOTE — Progress Notes (Signed)
The patient is a 48 year old female here for follow-up on her panniculectomy.  She is completely healed and doing very well.  No sign of infection, fluid or drainage.  X-rays were taken and placed in the chart with the patient's permission.  Follow-up as needed.

## 2019-01-14 ENCOUNTER — Encounter (INDEPENDENT_AMBULATORY_CARE_PROVIDER_SITE_OTHER): Payer: Self-pay

## 2019-01-14 ENCOUNTER — Ambulatory Visit (INDEPENDENT_AMBULATORY_CARE_PROVIDER_SITE_OTHER): Payer: Self-pay | Admitting: Psychology

## 2019-01-14 ENCOUNTER — Telehealth (INDEPENDENT_AMBULATORY_CARE_PROVIDER_SITE_OTHER): Payer: Self-pay | Admitting: Psychology

## 2019-01-14 NOTE — Progress Notes (Unsigned)
  Office: [phone]  /  Fax: [phone]    Date: January 14, 2019 Appointment Start Time:*** Duration:*** Provider: Glennie Isle, Psy.D. Type of Session: Individual Therapy  Location of Patient: *** Location of Provider: [facility] Type of Contact: Telepsychological Visit via [employer] WebEx   Session Content: Kristina [name] is a 48 y.o. female presenting via [employer] for a follow-up appointment to address the previously established treatment goal of decreasing emotional eating. Today's appointment was a telepsychological visit, as this provider's clinic is closed for in-person visits due to COVID-19. Therapeutic services will resume to in-person appointments once the clinic re-opens. Kristina [name] expressed understanding regarding the rationale for telepsychological services, and provided verbal consent for today's appointment. Prior to proceeding with today's appointment, Kristina [name]'s physical location at the time of this appointment was obtained. Kristina [name] reported she was at *** and provided the address. In the event of technical difficulties, Kristina [name] shared a phone number she could be reached at. Kristina [name] and this provider participated in today's telepsychological service. Also, Kristina [name] denied anyone else being present in the room or on the WebEx appointment ***.  This provider conducted a brief check-in and verbally administered the PHQ-9 and GAD-7. ***   Shakera was receptive to today's session as evidenced by openness to sharing, responsiveness to feedback, and ***.  Mental Status Examination:  Appearance: {Appearance:22431} Behavior: {Behavior:22445} Mood: {Teletherapy mood:22435} Affect: {Affect:22436} Speech: {Speech:22432} Eye Contact: {Eye Contact:22433} Psychomotor Activity: {Motor Activity:22434} Thought Process: {thought process:22448}  Content/Perceptual Disturbances: {disturbances:22451} Orientation: {Orientation:22437} Cognition/Sensorium: {gbcognition:22449} Insight:  {Insight:22446} Judgment: {Insight:22446}  Structured Assessment Results: The Patient Health Questionnaire-9 (PHQ-9) is a self-report measure that assesses symptoms and severity of depression over the course of the last two weeks. Kristina [name] obtained a score of *** suggesting {GBPHQ9SEVERITY:21752}. Kristina [name] finds the endorsed symptoms to be {gbphq9difficulty:21754}.  The Generalized Anxiety Disorder-7 (GAD-7) is a brief self-report measure that assesses symptoms of anxiety over the course of the last two weeks. Kristina [name] obtained a score of *** suggesting {gbgad7severity:21753}.  Interventions:  {Interventions:22172}  DSM-5 Diagnosis: 311 (F32.8) Other Specified Depressive Disorder, Emotional Eating Behaviors  Treatment Goal & Progress: During the initial appointment with this provider, the following treatment goal was established: decrease emotional eating. Kristina [name] has demonstrated progress in her goal as evidenced by ***  Plan: Kristina [name] continues to appear able and willing to participate as evidenced by engagement in reciprocal conversation, and asking questions for clarification as appropriate.*** The next appointment will be scheduled in {gbweeks:21758}, which will be via [employer]. Once this provider's office resumes in-person appointments, Kristina [name] will be notified. The next session will focus on reviewing learned skills, and working towards the established treatment goal.***

## 2019-01-14 NOTE — Telephone Encounter (Signed)
  Office: [phone]  /  Fax: [phone]  Date of Call: January 14, 2019 Time of Call: 10:02am Provider: Glennie Isle, PsyD  CONTENT: This provider called Sala, as she did not present for her 10:00am WebEx appointment. A HIPAA compliant voicemail was left requesting a call back. Notably, this provider waited in the WebEx appointment until 10:10am for Rasheeda; however, she did not present for the appointment.   PLAN: This provider will wait for Kathee to call back or send a MyChart message to reschedule today's missed appointment.

## 2019-01-18 ENCOUNTER — Ambulatory Visit (INDEPENDENT_AMBULATORY_CARE_PROVIDER_SITE_OTHER): Payer: Medicare Other | Admitting: Family Medicine

## 2019-01-18 ENCOUNTER — Encounter (INDEPENDENT_AMBULATORY_CARE_PROVIDER_SITE_OTHER): Payer: Self-pay | Admitting: Family Medicine

## 2019-01-18 ENCOUNTER — Other Ambulatory Visit: Payer: Self-pay

## 2019-01-18 DIAGNOSIS — Z6841 Body Mass Index (BMI) 40.0 and over, adult: Secondary | ICD-10-CM | POA: Diagnosis not present

## 2019-01-18 DIAGNOSIS — E559 Vitamin D deficiency, unspecified: Secondary | ICD-10-CM

## 2019-01-18 DIAGNOSIS — E1165 Type 2 diabetes mellitus with hyperglycemia: Secondary | ICD-10-CM | POA: Diagnosis not present

## 2019-01-19 MED ORDER — VITAMIN D (ERGOCALCIFEROL) 1.25 MG (50000 UNIT) PO CAPS: 50000.0000 [IU] | ORAL_CAPSULE | ORAL | 0 refills | Status: DC

## 2019-01-19 NOTE — Progress Notes (Signed)
Office: [phone]  /  Fax: [phone] TeleHealth Visit:  Kristina [name] has verbally consented to this TeleHealth visit today. The patient is located at home, the provider is located at the [facility] office. The participants in this visit include the listed provider and patient. The visit was conducted today via face time.  HPI:   Chief Complaint: OBESITY Kristina [name] is here to discuss her progress with her obesity treatment plan. She is on the Category 2 plan and is following her eating plan approximately 50 % of the time. She states she is walking for 30 minutes 3 times per week. Kristina [name] has made a goal of losing 25 lbs by July 4th. She has finally got to see her grandchildren this past weekend. She has been doing some indulgent eating, especially late at night, secondary to staying up late. She states she is still at 224 lbs.  We were unable to weigh the patient today for this TeleHealth visit. She feels as if she has maintained her weight since her last visit. She has lost 16 lbs since starting treatment with [place].  Vitamin D Deficiency Kristina [name] has a diagnosis of vitamin D deficiency. She is currently taking prescription Vit D. She notes fatigue and denies nausea, vomiting or muscle weakness.  Diabetes II with Hyperglycemia Kristina [name] has a diagnosis of diabetes type II. Kristina [name] denies GI side effects of metformin and notes carbohydrate cravings. Last A1c was 5.2. She has been working on intensive lifestyle modifications including diet, exercise, and weight loss to help control her blood glucose levels.  ASSESSMENT AND PLAN:  Vitamin D deficiency - Plan: Vitamin D, Ergocalciferol, (DRISDOL) 1.25 MG (50000 UT) CAPS capsule  Type 2 diabetes mellitus with hyperglycemia, without long-term current use of insulin (HCC)  Class 3 severe obesity with serious comorbidity and body mass index (BMI) of 40.0 to 44.9 in adult, unspecified obesity type ([place])  PLAN:  Vitamin D Deficiency  Kristina [name] was informed that low vitamin D levels contributes to fatigue and are associated with obesity, breast, and colon cancer. Kristina [name] agrees to continue taking prescription Vit D @50 ,000 IU every week #4 and we will refill for 1 month. She will follow up for routine testing of vitamin D, at least 2-3 times per year. She was informed of the risk of over-replacement of vitamin D and agrees to not increase her dose unless she discusses this with [place] first. Kristina [name] agrees to follow up with our clinic in 2 weeks.  Diabetes II with Hyperglycemia Kristina [name] has been given extensive diabetes education by myself today including ideal fasting and post-prandial blood glucose readings, individual ideal Hgb A1c goals and hypoglycemia prevention. We discussed the importance of good blood sugar control to decrease the likelihood of diabetic complications such as nephropathy, neuropathy, limb loss, blindness, coronary artery disease, and death. We discussed the importance of intensive lifestyle modification including diet, exercise and weight loss as the first line treatment for diabetes. Kristina [name] agrees to continue her diabetes medications, and we will repeat labs at the end of July. Kristina [name] agrees to follow up with our clinic in 2 weeks.  Obesity Kristina [name] is currently in the action stage of change. As such, her goal is to continue with weight loss efforts She has agreed to follow the Category 2 plan Kristina [name] has been instructed to work up to a goal of 150 minutes of combined cardio and strengthening exercise per week for weight loss and overall health benefits. We discussed the following Behavioral Modification Strategies today:  increasing lean protein intake, increasing vegetables and work on meal planning and easy cooking plans, keeping healthy foods in the home, better snacking choices, and planning for success   Kristina [name] has agreed to follow up with our clinic in 2 weeks. She was informed of the importance of frequent follow up visits  to maximize her success with intensive lifestyle modifications for her multiple health conditions.  ALLERGIES: Allergies  Allergen Reactions  . Ampicillin Anaphylaxis    Has patient had a PCN reaction causing immediate rash, facial/tongue/throat swelling, SOB or lightheadedness with hypotension: Yes Has patient had a PCN reaction causing severe rash involving mucus membranes or skin necrosis: Yes Has patient had a PCN reaction that required hospitalization Yes Has patient had a PCN reaction occurring within the last 10 years: No If all of the above answers are "NO", then may proceed with Cephalosporin use.   . Codeine Hives and Shortness Of Breath  . Sulfonamide Derivatives Itching and Rash    MEDICATIONS: Current Outpatient Medications on File Prior to Visit  Medication Sig Dispense Refill  . ALBUTEROL IN Inhale into the lungs.    . blood glucose meter kit and supplies KIT Dispense based on patient and insurance preference. Use up to four times daily as directed. (FOR ICD-9 250.00, 250.01). 1 each 0  . buPROPion (WELLBUTRIN SR) 150 MG 12 hr tablet Take 1 tablet (150 mg total) by mouth 2 (two) times daily. 60 tablet 0  . cyanocobalamin (,VITAMIN B-12,) 1000 MCG/ML injection Inject 34m once a month.    . dexlansoprazole (DEXILANT) 60 MG capsule Take 1 capsule (60 mg total) by mouth daily before breakfast. 90 capsule 3  . glucose blood test strip Use as instructed 100 each 0  . Lancets (ONETOUCH ULTRASOFT) lancets Use as instructed 100 each 0  . levothyroxine (SYNTHROID, LEVOTHROID) 125 MCG tablet Take 1 tablet (125 mcg total) by mouth daily. 30 tablet 0  . metFORMIN (GLUCOPHAGE) 500 MG tablet Take 1 tablet (500 mg total) by mouth daily with breakfast. 30 tablet 0  . Vitamin D, Ergocalciferol, (DRISDOL) 1.25 MG (50000 UT) CAPS capsule Take 1 capsule (50,000 Units total) by mouth every 7 (seven) days. 4 capsule 0   No current facility-administered medications on file prior to visit.      PAST MEDICAL HISTORY: Past Medical History:  Diagnosis Date  . Allergic rhinitis   . Anxiety   . Asthma    albuterol PRN  . Bulging disc   . Cancer (HCC)    cervical cancer   . Chronic back pain   . Chronic cough   . Depression   . Diabetes mellitus   . Diverticulosis   . Dyspnea    chronic  . Family history of adverse reaction to anesthesia    pts daughter had reaction to profolol - difficulty with breathing   . GERD (gastroesophageal reflux disease)   . Heart murmur    no problems, managed by PCP  . High cholesterol   . History of bronchitis   . Hx of adenomatous and sessile serrated colonic polyps   . Hypertension    no meds since wt loss  . Hypothyroidism   . Lumbosacral spondylosis without myelopathy    history bulging disc-chronic pain(level 3 to 8).  . Normal cardiac stress test 02/2015   with normal EF  . Numbness    feet bilat comes and goes   . Obesity   . Obstructive sleep apnea    does not need CPAP  since losing wt  . Palpitations   . Pneumonia    last episode 2012  . PONV (postoperative nausea and vomiting)   . Prediabetes   . Restless leg   . Vitamin D deficiency   . Vocal cord dysfunction     PAST SURGICAL HISTORY: Past Surgical History:  Procedure Laterality Date  . ABDOMINAL HYSTERECTOMY  1996  . CHOLECYSTECTOMY  1994  . COLONOSCOPY WITH PROPOFOL N/A 03/10/2015   Procedure: COLONOSCOPY WITH PROPOFOL;  Surgeon: [name], MD;  Location: [facility];  Service: Endoscopy;  Laterality: N/A;  . ESOPHAGOGASTRODUODENOSCOPY N/A 10/27/2013   Procedure: ESOPHAGOGASTRODUODENOSCOPY (EGD);  Surgeon: [name], MD;  Location: [facility];  Service: Endoscopy;  Laterality: N/A;  . KNEE ARTHROSCOPY Right   . LAPAROSCOPIC GASTRIC SLEEVE RESECTION WITH HIATAL HERNIA REPAIR N/A 09/19/2015   Procedure: LAPAROSCOPIC GASTRIC SLEEVE RESECTION WITH HIATAL HERNIA REPAIR;  Surgeon: [name], MD;  Location: [facility];  Service: General;  Laterality: N/A;  .  LIPOSUCTION N/A 11/26/2018   Procedure: LIPOSUCTION;  Surgeon: Wallace Going, DO;  Location: [facility];  Service: Plastics;  Laterality: N/A;  . NOSE SURGERY  2004  . ovaries removed    . PANNICULECTOMY N/A 11/26/2018   Procedure: PANNICULECTOMY;  Surgeon: Wallace Going, DO;  Location: [facility];  Service: Plastics;  Laterality: N/A;  . TUBAL LIGATION    . UPPER GI ENDOSCOPY  09/19/2015   Procedure: UPPER GI ENDOSCOPY;  Surgeon: [name], MD;  Location: [facility];  Service: General;;    SOCIAL HISTORY: Social History   Tobacco Use  . Smoking status: Never Smoker  . Smokeless tobacco: Never Used  Substance Use Topics  . Alcohol use: No  . Drug use: No    FAMILY HISTORY: Family History  Problem Relation Age of Onset  . Emphysema Mother   . Allergies Mother   . Heart disease Mother   . Asthma Mother   . Ovarian cancer Mother   . Hypertension Mother   . Diabetes Mother   . High Cholesterol Mother   . Stroke Mother   . Thyroid disease Mother   . Depression Mother   . Anxiety disorder Mother   . Heart disease Father        deceased at age 4 from heart attack  . Sudden death Father   . High Cholesterol Father   . High blood pressure Father   . Stroke Maternal Grandmother   . Heart disease Maternal Grandmother   . Cancer Maternal Grandfather   . Heart disease Paternal Grandmother   . Multiple sclerosis Paternal Grandmother   . Heart attack Paternal Grandfather   . Thyroid disease Brother        died at 60  . Heart attack Brother        died at 61  . COPD Brother   . Colon cancer Brother 37  . Hyperthyroidism Sister   . Hypertension Brother   . Hypertension Daughter   . Allergies Child   . Asthma Son   . Asthma Brother   . Asthma Brother   . Lung cancer Maternal Aunt   . COPD Sister        died at 61  . Sudden death Sister   . Asthma Son     ROS: Review of Systems  Constitutional: Positive for malaise/fatigue.  Negative for weight loss.  Gastrointestinal: Negative for nausea and vomiting.  Musculoskeletal:       Negative  muscle weakness  Endo/Heme/Allergies:       Negative hypoglycemia    PHYSICAL EXAM: Pt in no acute distress  RECENT LABS AND TESTS: BMET    Component Value Date/Time   NA 142 12/29/2018 1032   K 4.2 12/29/2018 1032   CL 102 12/29/2018 1032   CO2 23 12/29/2018 1032   GLUCOSE 90 12/29/2018 1032   GLUCOSE 93 11/23/2018 1400   BUN 11 12/29/2018 1032   CREATININE 0.62 12/29/2018 1032   CREATININE 0.88 02/02/2015 1458   CREATININE 0.88 02/02/2015 1458   CALCIUM 9.4 12/29/2018 1032   GFRNONAA 108 12/29/2018 1032   GFRAA 124 12/29/2018 1032   Lab Results  Component Value Date   HGBA1C 5.2 12/29/2018   HGBA1C 5.5 07/16/2018   HGBA1C 5.4 02/05/2018   HGBA1C 6.3 (H) 09/14/2015   HGBA1C (H) 02/12/2011    6.2 (NOTE)                                                                       According to the ADA Clinical Practice Recommendations for 2011, when HbA1c is used as a screening test:   >=6.5%   Diagnostic of Diabetes Mellitus           (if abnormal result  is confirmed)  5.7-6.4%   Increased risk of developing Diabetes Mellitus  References:Diagnosis and Classification of Diabetes Mellitus,Diabetes AQTM,2263,33(LKTGY 1):S62-S69 and Standards of Medical Care in         Diabetes - 2011,Diabetes Care,2011,34  (Suppl 1):S11-S61.   Lab Results  Component Value Date   INSULIN 16.8 12/29/2018   INSULIN 6.9 07/16/2018   INSULIN 11.0 01/21/2018   CBC    Component Value Date/Time   WBC 5.8 02/05/2018 1055   WBC 3.8 (L) 11/10/2015 0527   RBC 4.43 02/05/2018 1055   RBC 4.46 11/10/2015 0527   HGB 11.5 (L) 11/26/2018 1637   HGB 12.4 02/05/2018 1055   HCT 37.3 02/05/2018 1055   PLT 121 (L) 11/10/2015 0527   MCV 84 02/05/2018 1055   MCH 28.0 02/05/2018 1055   MCH 27.4 11/10/2015 0527   MCHC 33.2 02/05/2018 1055   MCHC 31.3 11/10/2015 0527   RDW 13.9 02/05/2018 1055    LYMPHSABS 2.6 02/05/2018 1055   MONOABS 0.8 11/09/2015 0515   EOSABS 0.1 02/05/2018 1055   BASOSABS 0.0 02/05/2018 1055   Iron/TIBC/Ferritin/ %Sat No results found for: IRON, TIBC, FERRITIN, IRONPCTSAT Lipid Panel     Component Value Date/Time   CHOL 233 (H) 02/05/2018 1055   TRIG 157 (H) 02/05/2018 1055   HDL 51 02/05/2018 1055   CHOLHDL 4.5 02/12/2008 0540   VLDL 25 02/12/2008 0540   LDLCALC 151 (H) 02/05/2018 1055   Hepatic Function Panel     Component Value Date/Time   PROT 5.9 (L) 12/29/2018 1032   ALBUMIN 3.6 (L) 12/29/2018 1032   AST 11 12/29/2018 1032   ALT 11 12/29/2018 1032   ALKPHOS 113 12/29/2018 1032   BILITOT 0.3 12/29/2018 1032      Component Value Date/Time   TSH 1.020 12/29/2018 1032   TSH 0.481 07/16/2018 1259   TSH 0.593 05/12/2018 1451      I, Trixie Dredge, am acting as Location manager for Ilene Qua, MD  I have reviewed the above documentation for accuracy and completeness, and I agree with the above. - Ilene Qua, MD

## 2019-01-21 ENCOUNTER — Telehealth (INDEPENDENT_AMBULATORY_CARE_PROVIDER_SITE_OTHER): Payer: Self-pay | Admitting: Psychology

## 2019-01-21 NOTE — Telephone Encounter (Signed)
  Office: [phone]  /  Fax: [phone]  Date of Call: Jan 21, 2019 Time of Call: 9:45am Provider: Glennie Isle, PsyD  CONTENT: This provider called Cheyan to check-in as she did not present for her WebEx appointment on January 14, 2019. A HIPAA compliant voicemail was left requesting a call back.   PLAN: This provider will wait for Daviona to call back or send a MyChart message to reschedule the missed appointment.

## 2019-02-01 ENCOUNTER — Telehealth (INDEPENDENT_AMBULATORY_CARE_PROVIDER_SITE_OTHER): Payer: Self-pay | Admitting: Psychology

## 2019-02-01 NOTE — Telephone Encounter (Signed)
  Office: [phone]  /  Fax: [phone]  Date of Call: Feb 01, 2019 Time of Call: 2:43pm Provider: Glennie Isle, PsyD  CONTENT: This provider called Ricca again to check-in and schedule a follow-up appointment. A HIPAA compliant voicemail was left requesting a call back.   PLAN: This provider will wait for Elesia to call back to reschedule the missed appointment.

## 2019-02-03 ENCOUNTER — Encounter (INDEPENDENT_AMBULATORY_CARE_PROVIDER_SITE_OTHER): Payer: Self-pay

## 2019-02-03 ENCOUNTER — Ambulatory Visit (INDEPENDENT_AMBULATORY_CARE_PROVIDER_SITE_OTHER): Payer: Medicare Other | Admitting: Family Medicine

## 2019-03-18 ENCOUNTER — Other Ambulatory Visit: Payer: Self-pay

## 2019-03-18 ENCOUNTER — Encounter (HOSPITAL_COMMUNITY): Payer: Self-pay

## 2019-03-18 ENCOUNTER — Ambulatory Visit (HOSPITAL_COMMUNITY)
Admission: RE | Admit: 2019-03-18 | Discharge: 2019-03-18 | Disposition: A | Discharge status: Home / Self Care | Payer: Medicare Other | Source: Ambulatory Visit | Attending: Internal Medicine | Admitting: Internal Medicine

## 2019-03-18 DIAGNOSIS — D509 Iron deficiency anemia, unspecified: Secondary | ICD-10-CM | POA: Insufficient documentation

## 2019-03-18 MED ORDER — SODIUM CHLORIDE 0.9 % IV SOLN
INTRAVENOUS | Status: DC
  Administered 2019-03-18: 11:00:00 via INTRAVENOUS

## 2019-03-18 MED ORDER — SODIUM CHLORIDE 0.9 % IV SOLN
750.0000 mg | Freq: Once | INTRAVENOUS | Status: AC
  Administered 2019-03-18: 750 mg via INTRAVENOUS
  Filled 2019-03-18: qty 750

## 2019-03-18 NOTE — Progress Notes (Signed)
Patient tolerated infusion well. No complications. Post transfusion BP was 132/74.

## 2019-05-16 ENCOUNTER — Other Ambulatory Visit: Payer: Self-pay

## 2019-05-16 ENCOUNTER — Encounter (HOSPITAL_COMMUNITY): Payer: Self-pay | Admitting: Emergency Medicine

## 2019-05-16 ENCOUNTER — Inpatient Hospital Stay (HOSPITAL_COMMUNITY)
Admission: EM | Admit: 2019-05-16 | Discharge: 2019-05-18 | DRG: 287 | Disposition: A | Discharge status: Home / Self Care | Payer: Medicare Other | Attending: Interventional Cardiology | Admitting: Interventional Cardiology

## 2019-05-16 ENCOUNTER — Emergency Department (HOSPITAL_COMMUNITY): Payer: Medicare Other

## 2019-05-16 DIAGNOSIS — R079 Chest pain, unspecified: Secondary | ICD-10-CM | POA: Diagnosis not present

## 2019-05-16 DIAGNOSIS — Z882 Allergy status to sulfonamides status: Secondary | ICD-10-CM

## 2019-05-16 DIAGNOSIS — Z6841 Body Mass Index (BMI) 40.0 and over, adult: Secondary | ICD-10-CM

## 2019-05-16 DIAGNOSIS — Z818 Family history of other mental and behavioral disorders: Secondary | ICD-10-CM

## 2019-05-16 DIAGNOSIS — Z8041 Family history of malignant neoplasm of ovary: Secondary | ICD-10-CM

## 2019-05-16 DIAGNOSIS — Z823 Family history of stroke: Secondary | ICD-10-CM

## 2019-05-16 DIAGNOSIS — I25110 Atherosclerotic heart disease of native coronary artery with unstable angina pectoris: Secondary | ICD-10-CM | POA: Diagnosis not present

## 2019-05-16 DIAGNOSIS — E785 Hyperlipidemia, unspecified: Secondary | ICD-10-CM | POA: Diagnosis present

## 2019-05-16 DIAGNOSIS — K219 Gastro-esophageal reflux disease without esophagitis: Secondary | ICD-10-CM | POA: Diagnosis present

## 2019-05-16 DIAGNOSIS — Z820 Family history of epilepsy and other diseases of the nervous system: Secondary | ICD-10-CM

## 2019-05-16 DIAGNOSIS — F329 Major depressive disorder, single episode, unspecified: Secondary | ICD-10-CM | POA: Diagnosis present

## 2019-05-16 DIAGNOSIS — Z825 Family history of asthma and other chronic lower respiratory diseases: Secondary | ICD-10-CM

## 2019-05-16 DIAGNOSIS — F419 Anxiety disorder, unspecified: Secondary | ICD-10-CM | POA: Diagnosis present

## 2019-05-16 DIAGNOSIS — Z8249 Family history of ischemic heart disease and other diseases of the circulatory system: Secondary | ICD-10-CM

## 2019-05-16 DIAGNOSIS — Z800 Family history of malignant neoplasm of digestive organs: Secondary | ICD-10-CM

## 2019-05-16 DIAGNOSIS — Z8349 Family history of other endocrine, nutritional and metabolic diseases: Secondary | ICD-10-CM

## 2019-05-16 DIAGNOSIS — J45909 Unspecified asthma, uncomplicated: Secondary | ICD-10-CM | POA: Diagnosis present

## 2019-05-16 DIAGNOSIS — E039 Hypothyroidism, unspecified: Secondary | ICD-10-CM | POA: Diagnosis present

## 2019-05-16 DIAGNOSIS — Z880 Allergy status to penicillin: Secondary | ICD-10-CM

## 2019-05-16 DIAGNOSIS — Z79890 Hormone replacement therapy (postmenopausal): Secondary | ICD-10-CM

## 2019-05-16 DIAGNOSIS — E7800 Pure hypercholesterolemia, unspecified: Secondary | ICD-10-CM | POA: Diagnosis present

## 2019-05-16 DIAGNOSIS — Z79899 Other long term (current) drug therapy: Secondary | ICD-10-CM

## 2019-05-16 DIAGNOSIS — Z9884 Bariatric surgery status: Secondary | ICD-10-CM

## 2019-05-16 DIAGNOSIS — Z833 Family history of diabetes mellitus: Secondary | ICD-10-CM

## 2019-05-16 DIAGNOSIS — Z886 Allergy status to analgesic agent status: Secondary | ICD-10-CM

## 2019-05-16 DIAGNOSIS — Z7984 Long term (current) use of oral hypoglycemic drugs: Secondary | ICD-10-CM

## 2019-05-16 DIAGNOSIS — Z8541 Personal history of malignant neoplasm of cervix uteri: Secondary | ICD-10-CM

## 2019-05-16 DIAGNOSIS — Z20828 Contact with and (suspected) exposure to other viral communicable diseases: Secondary | ICD-10-CM | POA: Diagnosis present

## 2019-05-16 DIAGNOSIS — G4733 Obstructive sleep apnea (adult) (pediatric): Secondary | ICD-10-CM | POA: Diagnosis present

## 2019-05-16 DIAGNOSIS — G2581 Restless legs syndrome: Secondary | ICD-10-CM | POA: Diagnosis present

## 2019-05-16 DIAGNOSIS — Z801 Family history of malignant neoplasm of trachea, bronchus and lung: Secondary | ICD-10-CM

## 2019-05-16 HISTORY — DX: Malignant neoplasm of cervix uteri, unspecified: C53.9

## 2019-05-16 HISTORY — DX: Personal history of pneumonia (recurrent): Z87.01

## 2019-05-16 HISTORY — DX: Hyperlipidemia, unspecified: E78.5

## 2019-05-16 HISTORY — DX: Type 2 diabetes mellitus without complications: E11.9

## 2019-05-16 LAB — CBC
HCT: 40.9 % (ref 36.0–46.0)
Hemoglobin: 12.6 g/dL (ref 12.0–15.0)
MCH: 24.5 pg — ABNORMAL LOW (ref 26.0–34.0)
MCHC: 30.8 g/dL (ref 30.0–36.0)
MCV: 79.4 fL — ABNORMAL LOW (ref 80.0–100.0)
Platelets: 224 10*3/uL (ref 150–400)
RBC: 5.15 MIL/uL — ABNORMAL HIGH (ref 3.87–5.11)
RDW: 23.4 % — ABNORMAL HIGH (ref 11.5–15.5)
WBC: 9.2 10*3/uL (ref 4.0–10.5)
nRBC: 0 % (ref 0.0–0.2)

## 2019-05-16 LAB — BASIC METABOLIC PANEL
Anion gap: 9 (ref 5–15)
BUN: 11 mg/dL (ref 6–20)
CO2: 21 mmol/L — ABNORMAL LOW (ref 22–32)
Calcium: 9.3 mg/dL (ref 8.9–10.3)
Chloride: 111 mmol/L (ref 98–111)
Creatinine, Ser: 0.66 mg/dL (ref 0.44–1.00)
GFR calc Af Amer: >60 mL/min (ref >60)
GFR calc non Af Amer: >60 mL/min (ref >60)
Glucose, Bld: 128 mg/dL — ABNORMAL HIGH (ref 70–99)
Potassium: 3.9 mmol/L (ref 3.5–5.1)
Sodium: 141 mmol/L (ref 135–145)

## 2019-05-16 LAB — TROPONIN I (HIGH SENSITIVITY): Troponin I (High Sensitivity): <2 ng/L (ref <18)

## 2019-05-16 LAB — CBG MONITORING, ED: Glucose-Capillary: 113 mg/dL — ABNORMAL HIGH (ref 70–99)

## 2019-05-16 LAB — D-DIMER, QUANTITATIVE: D-Dimer, Quant: 0.3 ug/mL-FEU (ref 0.00–0.50)

## 2019-05-16 MED ORDER — ONDANSETRON HCL 4 MG/2ML IJ SOLN
4.0000 mg | Freq: Once | INTRAMUSCULAR | Status: AC
  Administered 2019-05-16: 4 mg via INTRAVENOUS
  Filled 2019-05-16: qty 2

## 2019-05-16 MED ORDER — NITROGLYCERIN 0.4 MG SL SUBL
0.4000 mg | SUBLINGUAL_TABLET | SUBLINGUAL | Status: DC | PRN
  Administered 2019-05-16 – 2019-05-17 (×3): 0.4 mg via SUBLINGUAL
  Filled 2019-05-16 (×3): qty 1

## 2019-05-16 MED ORDER — ASPIRIN 81 MG PO CHEW
324.0000 mg | CHEWABLE_TABLET | Freq: Once | ORAL | Status: AC
  Administered 2019-05-16: 324 mg via ORAL
  Filled 2019-05-16: qty 4

## 2019-05-16 NOTE — ED Triage Notes (Signed)
Pt reports chest pain that began yesterday after mowing her lawn. Pt states the pain has been going on all day without relief. Pt states she took her neighbors nitro and that helped relieve the pain.

## 2019-05-16 NOTE — ED Provider Notes (Signed)
[facility] EMERGENCY DEPARTMENT Provider Note   CSN: [id number] Arrival date & time: 05/16/19  2123     History   Chief Complaint Chief Complaint  Patient presents with  . Chest Pain    HPI Kristina [name] is a 48 y.o. female.     HPI   Kristina [name] is a 48 y.o. female who presents to the Emergency Department complaining of gradual onset of left-sided chest pain that began yesterday evening.  She states that she was mowing her lawn with a riding mower yesterday evening.  She went inside to take a shower and developed left-sided chest pain under her left breast that radiates to the center of her chest.  She describes the pain as dull and continuous.  She woke this morning with persisting pain.  She took her 1 of her neighbors sublingual nitroglycerin tablets around 8 PM this evening.  This provided some relief of her symptoms.  She states her chest pain has been associated with nausea that began around 5 today.  She denies vomiting, shortness of breath, back pain, and diaphoresis.  She does report a history of an cardiac arrhythmia, but no previous coronary artery disease.  She is a non-smoker.    Past Medical History:  Diagnosis Date  . Allergic rhinitis   . Anxiety   . Asthma    albuterol PRN  . Bulging disc   . Cancer (HCC)    cervical cancer   . Chronic back pain   . Chronic cough   . Depression   . Diabetes mellitus   . Diverticulosis   . Dyspnea    chronic  . Family history of adverse reaction to anesthesia    pts daughter had reaction to profolol - difficulty with breathing   . GERD (gastroesophageal reflux disease)   . Heart murmur    no problems, managed by PCP  . High cholesterol   . History of bronchitis   . Hx of adenomatous and sessile serrated colonic polyps   . Hypertension    no meds since wt loss  . Hypothyroidism   . Lumbosacral spondylosis without myelopathy    history bulging disc-chronic pain(level 3 to 8).  . Normal cardiac stress  test 02/2015   with normal EF  . Numbness    feet bilat comes and goes   . Obesity   . Obstructive sleep apnea    does not need CPAP since losing wt  . Palpitations   . Pneumonia    last episode 2012  . PONV (postoperative nausea and vomiting)   . Prediabetes   . Restless leg   . Vitamin D deficiency   . Vocal cord dysfunction     Patient Active Problem List   Diagnosis Date Noted  . Lipodystrophy 08/09/2018  . Neck pain 08/07/2018  . Panniculitis 08/07/2018  . Chronic bilateral thoracic back pain 08/07/2018  . Degeneration of lumbar intervertebral disc 06/04/2018  . Family history of colon cancer - brother 03/31/2018  . Nausea vomiting and diarrhea 11/09/2015  . Dehydration 11/09/2015  . Hypokalemia 11/09/2015  . Gastroenteritis 11/09/2015  . S/P panniculectomy 09/19/2015  . Preoperative cardiovascular examination 07/13/2015  . Hx of adenomatous and sessile serrated colonic polyps   . Allergic rhinitis, seasonal 08/25/2012  . Adult hypothyroidism 08/25/2012  . HLD (hyperlipidemia) 08/25/2012  . Diabetes mellitus ([place]) 08/25/2012  . Back pain, chronic 08/25/2012  . Hypothyroid 08/25/2012  . Anxiety 08/25/2012  . Depression 08/25/2012  . Diabetes ([place]) 08/25/2012  .  GERD (gastroesophageal reflux disease) 08/25/2012  . Hyperlipidemia 08/25/2012  . OSA (obstructive sleep apnea) 03/16/2012  . Cough 01/30/2012  . Asthma 01/30/2012  . PALPITATIONS 08/15/2008  . DYSPNEA 08/15/2008  . Chest pain 08/15/2008  . IRRITABLE BOWEL SYNDROME 02/26/2008  . Hypercholesterolemia with hypertriglyceridemia 01/04/2008  . Morbid obesity ([place]) 01/04/2008  . Anxiety state 01/04/2008  . DEPRESSION 01/04/2008  . Essential hypertension 01/04/2008  . GERD 01/04/2008  . FATIGUE 01/04/2008  . HEADACHE, CHRONIC 01/04/2008  . GASTRITIS 04/02/2007    Past Surgical History:  Procedure Laterality Date  . ABDOMINAL HYSTERECTOMY  1996  . CHOLECYSTECTOMY  1994  . COLONOSCOPY WITH PROPOFOL  N/A 03/10/2015   Procedure: COLONOSCOPY WITH PROPOFOL;  Surgeon: [name], MD;  Location: [facility];  Service: Endoscopy;  Laterality: N/A;  . ESOPHAGOGASTRODUODENOSCOPY N/A 10/27/2013   Procedure: ESOPHAGOGASTRODUODENOSCOPY (EGD);  Surgeon: [name], MD;  Location: [facility];  Service: Endoscopy;  Laterality: N/A;  . KNEE ARTHROSCOPY Right   . LAPAROSCOPIC GASTRIC SLEEVE RESECTION WITH HIATAL HERNIA REPAIR N/A 09/19/2015   Procedure: LAPAROSCOPIC GASTRIC SLEEVE RESECTION WITH HIATAL HERNIA REPAIR;  Surgeon: [name], MD;  Location: [facility];  Service: General;  Laterality: N/A;  . LIPOSUCTION N/A 11/26/2018   Procedure: LIPOSUCTION;  Surgeon: Wallace Going, DO;  Location: [place];  Service: Plastics;  Laterality: N/A;  . NOSE SURGERY  2004  . ovaries removed    . PANNICULECTOMY N/A 11/26/2018   Procedure: PANNICULECTOMY;  Surgeon: Wallace Going, DO;  Location: [place];  Service: Plastics;  Laterality: N/A;  . TUBAL LIGATION    . UPPER GI ENDOSCOPY  09/19/2015   Procedure: UPPER GI ENDOSCOPY;  Surgeon: [name], MD;  Location: [facility];  Service: General;;     OB History    Gravida  5   Para  3   Term      Preterm      AB  2   Living        SAB  1   TAB  1   Ectopic      Multiple      Live Births               Home Medications    Prior to Admission medications   Medication Sig Start Date End Date Taking? Authorizing Provider  ALBUTEROL IN Inhale into the lungs.    [provider]  blood glucose meter kit and supplies KIT Dispense based on patient and insurance preference. Use up to four times daily as directed. (FOR ICD-9 250.00, 250.01). 01/06/19   Eber Jones, MD  buPROPion [facility] SR) 150 MG 12 hr tablet Take 1 tablet (150 mg total) by mouth 2 (two) times daily. 01/06/19   Eber Jones, MD  cyanocobalamin (,VITAMIN B-12,) 1000 MCG/ML injection Inject 17m once a month.  12/07/18   [provider]  dexlansoprazole (DEXILANT) 60 MG capsule Take 1 capsule (60 mg total) by mouth daily before breakfast. 04/06/18   GGatha Mayer MD  glucose blood test strip Use as instructed 01/06/19   KEber Jones MD  Lancets ([facility]) lancets Use as instructed 01/06/19   KEber Jones MD  levothyroxine (SYNTHROID, LEVOTHROID) 125 MCG tablet Take 1 tablet (125 mcg total) by mouth daily. 12/22/18   KEber Jones MD  metFORMIN (GLUCOPHAGE) 500 MG tablet Take 1 tablet (500 mg total) by mouth daily with breakfast. 01/06/19   KEber Jones  MD  Vitamin D, Ergocalciferol, (DRISDOL) 1.25 MG (50000 UT) CAPS capsule Take 1 capsule (50,000 Units total) by mouth every 7 (seven) days. 01/19/19   Eber Jones, MD    Family History Family History  Problem Relation Age of Onset  . Emphysema Mother   . Allergies Mother   . Heart disease Mother   . Asthma Mother   . Ovarian cancer Mother   . Hypertension Mother   . Diabetes Mother   . High Cholesterol Mother   . Stroke Mother   . Thyroid disease Mother   . Depression Mother   . Anxiety disorder Mother   . Heart disease Father        deceased at age 86 from heart attack  . Sudden death Father   . High Cholesterol Father   . High blood pressure Father   . Stroke Maternal Grandmother   . Heart disease Maternal Grandmother   . Cancer Maternal Grandfather   . Heart disease Paternal Grandmother   . Multiple sclerosis Paternal Grandmother   . Heart attack Paternal Grandfather   . Thyroid disease Brother        died at 55  . Heart attack Brother        died at 47  . COPD Brother   . Colon cancer Brother 53  . Hyperthyroidism Sister   . Hypertension Brother   . Hypertension Daughter   . Allergies Child   . Asthma Son   . Asthma Brother   . Asthma Brother   . Lung cancer Maternal Aunt   . COPD Sister        died at 91  . Sudden death Sister   . Asthma Son     Social  History Social History   Tobacco Use  . Smoking status: Never Smoker  . Smokeless tobacco: Never Used  Substance Use Topics  . Alcohol use: No  . Drug use: No     Allergies   Ampicillin, Codeine, and Sulfonamide derivatives   Review of Systems Review of Systems  Constitutional: Negative for appetite change, chills, diaphoresis and fever.  HENT: Negative for trouble swallowing.   Respiratory: Negative for cough, chest tightness, shortness of breath and wheezing.   Cardiovascular: Positive for chest pain. Negative for leg swelling.  Gastrointestinal: Positive for nausea. Negative for abdominal pain and vomiting.  Genitourinary: Negative for dysuria and flank pain.  Musculoskeletal: Negative for arthralgias and myalgias.  Skin: Negative for rash.  Neurological: Negative for dizziness, syncope, weakness, numbness and headaches.     Physical Exam Updated Vital Signs BP 128/66   Pulse (!) 101   Temp 98.4 F (36.9 C) (Oral)   Resp (!) 21   Wt 102.5 kg   SpO2 98%   BMI 40.03 kg/m   Physical Exam Vitals signs and nursing note reviewed.  Constitutional:      General: She is not in acute distress.    Appearance: Normal appearance. She is well-developed. She is not ill-appearing or toxic-appearing.  HENT:     Head: Normocephalic.     Mouth/Throat:     Mouth: Mucous membranes are moist.  Neck:     Musculoskeletal: Normal range of motion and neck supple.     Vascular: No JVD.  Cardiovascular:     Rate and Rhythm: Normal rate and regular rhythm.     Pulses: Normal pulses.  Pulmonary:     Effort: Pulmonary effort is normal.     Breath sounds: Normal breath sounds.  Chest:     Chest wall: No tenderness.  Abdominal:     Palpations: Abdomen is soft. There is no mass.     Tenderness: There is no abdominal tenderness. There is no guarding.  Musculoskeletal: Normal range of motion.     Right lower leg: No edema.     Left lower leg: No edema.  Skin:    General: Skin is  warm.     Capillary Refill: Capillary refill takes less than 2 seconds.     Findings: No erythema or rash.  Neurological:     General: No focal deficit present.     Mental Status: She is alert.     Sensory: No sensory deficit.     Motor: No weakness.  Psychiatric:        Mood and Affect: Mood normal.      ED Treatments / Results  Labs (all labs ordered are listed, but only abnormal results are displayed) Labs Reviewed  BASIC METABOLIC PANEL - Abnormal; Notable for the following components:      Result Value   CO2 21 (*)    Glucose, Bld 128 (*)    All other components within normal limits  CBC - Abnormal; Notable for the following components:   RBC 5.15 (*)    MCV 79.4 (*)    MCH 24.5 (*)    RDW 23.4 (*)    All other components within normal limits  SARS CORONAVIRUS 2 (HOSPITAL ORDER, [place] [facility])  D-DIMER, QUANTITATIVE (NOT AT [facility])  CBG MONITORING, ED  TROPONIN I (HIGH SENSITIVITY)  TROPONIN I (HIGH SENSITIVITY)    EKG EKG Interpretation  Date/Time:  Sunday May 16 2019 21:35:28 EDT Ventricular Rate:  94 PR Interval:    QRS Duration: 110 QT Interval:  353 QTC Calculation: 442 R Axis:   79 Text Interpretation:  Sinus rhythm Prolonged PR interval Consider left atrial enlargement Low voltage, precordial leads Confirmed by Nat Christen [phone]) on 05/16/2019 10:03:25 PM   Radiology Dg Chest 2 View  Result Date: 05/16/2019 CLINICAL DATA:  Chest pain. EXAM: CHEST - 2 VIEW COMPARISON:  08/01/2015 FINDINGS: The cardiomediastinal contours are normal. The lungs are clear. Pulmonary vasculature is normal. No consolidation, pleural effusion, or pneumothorax. No acute osseous abnormalities are seen. Mild degenerative change in the spine. IMPRESSION: No acute findings. Electronically Signed   By: Keith Rake M.D.   On: 05/16/2019 22:07    Procedures Procedures (including critical care time)  Medications Ordered in ED Medications - No data to  display   Initial Impression / Assessment and Plan / ED Course  I have reviewed the triage vital signs and the nursing notes.  Pertinent labs & imaging results that were available during my care of the patient were reviewed by me and considered in my medical decision making (see chart for details).        HEART score is 5.    Patient with onset of left-sided chest pain that began yesterday.  Patient took her neighbors sublingual nitro with some relief although pain was not completely resolved.  Pt given SL NTG here, pain improved.  Pain down from "8" to "5"  ASA given as well.  Hx concerning for cardiac process.  Will consult hospitalist for admit for cardiac r/o.    Consulted hospitalist, Dr. Darrell Jewel and discussed findings, agrees to admit for obs.    Final Clinical Impressions(s) / ED Diagnoses   Final diagnoses:  Chest pain, unspecified type  ED Discharge Orders    None       Kem Parkinson, Hershal Coria 05/16/19 2348    Nat Christen, MD 05/19/19 [phone]

## 2019-05-17 ENCOUNTER — Encounter (HOSPITAL_COMMUNITY): Payer: Self-pay | Admitting: Cardiology

## 2019-05-17 ENCOUNTER — Observation Stay (HOSPITAL_BASED_OUTPATIENT_CLINIC_OR_DEPARTMENT_OTHER): Payer: Medicare Other

## 2019-05-17 ENCOUNTER — Encounter (HOSPITAL_COMMUNITY)
Admission: EM | Disposition: A | Discharge status: Home / Self Care | Payer: Self-pay | Source: Home / Self Care | Attending: Interventional Cardiology

## 2019-05-17 DIAGNOSIS — K219 Gastro-esophageal reflux disease without esophagitis: Secondary | ICD-10-CM | POA: Diagnosis not present

## 2019-05-17 DIAGNOSIS — Z8249 Family history of ischemic heart disease and other diseases of the circulatory system: Secondary | ICD-10-CM

## 2019-05-17 DIAGNOSIS — Z79899 Other long term (current) drug therapy: Secondary | ICD-10-CM | POA: Diagnosis not present

## 2019-05-17 DIAGNOSIS — R079 Chest pain, unspecified: Secondary | ICD-10-CM

## 2019-05-17 DIAGNOSIS — F329 Major depressive disorder, single episode, unspecified: Secondary | ICD-10-CM | POA: Diagnosis not present

## 2019-05-17 DIAGNOSIS — Z7984 Long term (current) use of oral hypoglycemic drugs: Secondary | ICD-10-CM | POA: Diagnosis not present

## 2019-05-17 DIAGNOSIS — I25110 Atherosclerotic heart disease of native coronary artery with unstable angina pectoris: Secondary | ICD-10-CM | POA: Diagnosis present

## 2019-05-17 DIAGNOSIS — Z8041 Family history of malignant neoplasm of ovary: Secondary | ICD-10-CM | POA: Diagnosis not present

## 2019-05-17 DIAGNOSIS — Z833 Family history of diabetes mellitus: Secondary | ICD-10-CM | POA: Diagnosis not present

## 2019-05-17 DIAGNOSIS — Z20828 Contact with and (suspected) exposure to other viral communicable diseases: Secondary | ICD-10-CM | POA: Diagnosis not present

## 2019-05-17 DIAGNOSIS — Z825 Family history of asthma and other chronic lower respiratory diseases: Secondary | ICD-10-CM | POA: Diagnosis not present

## 2019-05-17 DIAGNOSIS — I200 Unstable angina: Secondary | ICD-10-CM

## 2019-05-17 DIAGNOSIS — Z9884 Bariatric surgery status: Secondary | ICD-10-CM | POA: Diagnosis not present

## 2019-05-17 DIAGNOSIS — Z820 Family history of epilepsy and other diseases of the nervous system: Secondary | ICD-10-CM | POA: Diagnosis not present

## 2019-05-17 DIAGNOSIS — F419 Anxiety disorder, unspecified: Secondary | ICD-10-CM | POA: Diagnosis present

## 2019-05-17 DIAGNOSIS — Z823 Family history of stroke: Secondary | ICD-10-CM | POA: Diagnosis not present

## 2019-05-17 DIAGNOSIS — Z8349 Family history of other endocrine, nutritional and metabolic diseases: Secondary | ICD-10-CM | POA: Diagnosis not present

## 2019-05-17 DIAGNOSIS — E7800 Pure hypercholesterolemia, unspecified: Secondary | ICD-10-CM | POA: Diagnosis not present

## 2019-05-17 DIAGNOSIS — E785 Hyperlipidemia, unspecified: Secondary | ICD-10-CM | POA: Diagnosis not present

## 2019-05-17 DIAGNOSIS — Z79890 Hormone replacement therapy (postmenopausal): Secondary | ICD-10-CM | POA: Diagnosis not present

## 2019-05-17 DIAGNOSIS — Z8541 Personal history of malignant neoplasm of cervix uteri: Secondary | ICD-10-CM | POA: Diagnosis not present

## 2019-05-17 DIAGNOSIS — G4733 Obstructive sleep apnea (adult) (pediatric): Secondary | ICD-10-CM | POA: Diagnosis present

## 2019-05-17 DIAGNOSIS — G2581 Restless legs syndrome: Secondary | ICD-10-CM | POA: Diagnosis not present

## 2019-05-17 DIAGNOSIS — E039 Hypothyroidism, unspecified: Secondary | ICD-10-CM | POA: Diagnosis not present

## 2019-05-17 DIAGNOSIS — Z6841 Body Mass Index (BMI) 40.0 and over, adult: Secondary | ICD-10-CM | POA: Diagnosis not present

## 2019-05-17 HISTORY — PX: LEFT HEART CATH AND CORONARY ANGIOGRAPHY: CATH118249

## 2019-05-17 LAB — CBC WITH DIFFERENTIAL/PLATELET
Abs Immature Granulocytes: 0.01 10*3/uL (ref 0.00–0.07)
Basophils Absolute: 0.1 10*3/uL (ref 0.0–0.1)
Basophils Relative: 1 %
Eosinophils Absolute: 0.1 10*3/uL (ref 0.0–0.5)
Eosinophils Relative: 2 %
HCT: 38.5 % (ref 36.0–46.0)
Hemoglobin: 11.5 g/dL — ABNORMAL LOW (ref 12.0–15.0)
Immature Granulocytes: 0 %
Lymphocytes Relative: 45 %
Lymphs Abs: 2.6 10*3/uL (ref 0.7–4.0)
MCH: 24.2 pg — ABNORMAL LOW (ref 26.0–34.0)
MCHC: 29.9 g/dL — ABNORMAL LOW (ref 30.0–36.0)
MCV: 81.1 fL (ref 80.0–100.0)
Monocytes Absolute: 0.5 10*3/uL (ref 0.1–1.0)
Monocytes Relative: 8 %
Neutro Abs: 2.6 10*3/uL (ref 1.7–7.7)
Neutrophils Relative %: 44 %
Platelets: 194 10*3/uL (ref 150–400)
RBC: 4.75 MIL/uL (ref 3.87–5.11)
RDW: 23.7 % — ABNORMAL HIGH (ref 11.5–15.5)
WBC: 5.9 10*3/uL (ref 4.0–10.5)
nRBC: 0 % (ref 0.0–0.2)

## 2019-05-17 LAB — COMPREHENSIVE METABOLIC PANEL
ALT: 13 U/L (ref 0–44)
AST: 13 U/L — ABNORMAL LOW (ref 15–41)
Albumin: 3.7 g/dL (ref 3.5–5.0)
Alkaline Phosphatase: 102 U/L (ref 38–126)
Anion gap: 8 (ref 5–15)
BUN: 12 mg/dL (ref 6–20)
CO2: 23 mmol/L (ref 22–32)
Calcium: 9.1 mg/dL (ref 8.9–10.3)
Chloride: 110 mmol/L (ref 98–111)
Creatinine, Ser: 0.58 mg/dL (ref 0.44–1.00)
GFR calc Af Amer: >60 mL/min (ref >60)
GFR calc non Af Amer: >60 mL/min (ref >60)
Glucose, Bld: 87 mg/dL (ref 70–99)
Potassium: 3.7 mmol/L (ref 3.5–5.1)
Sodium: 141 mmol/L (ref 135–145)
Total Bilirubin: 0.2 mg/dL — ABNORMAL LOW (ref 0.3–1.2)
Total Protein: 6.7 g/dL (ref 6.5–8.1)

## 2019-05-17 LAB — LIPID PANEL
Cholesterol: 205 mg/dL — ABNORMAL HIGH (ref 0–200)
HDL: 44 mg/dL (ref >40)
LDL Cholesterol: 138 mg/dL — ABNORMAL HIGH (ref 0–99)
Total CHOL/HDL Ratio: 4.7 RATIO
Triglycerides: 114 mg/dL (ref <150)
VLDL: 23 mg/dL (ref 0–40)

## 2019-05-17 LAB — ECHOCARDIOGRAM COMPLETE
Height: 63 in
Weight: 3597.91 oz

## 2019-05-17 LAB — GLUCOSE, CAPILLARY
Glucose-Capillary: 94 mg/dL (ref 70–99)
Glucose-Capillary: 82 mg/dL (ref 70–99)
Glucose-Capillary: 84 mg/dL (ref 70–99)
Glucose-Capillary: 110 mg/dL — ABNORMAL HIGH (ref 70–99)
Glucose-Capillary: 102 mg/dL — ABNORMAL HIGH (ref 70–99)

## 2019-05-17 LAB — SARS CORONAVIRUS 2 BY RT PCR (HOSPITAL ORDER, PERFORMED IN ~~LOC~~ HOSPITAL LAB): SARS Coronavirus 2: NEGATIVE

## 2019-05-17 LAB — MAGNESIUM: Magnesium: 2.1 mg/dL (ref 1.7–2.4)

## 2019-05-17 LAB — HEMOGLOBIN A1C
Hgb A1c MFr Bld: 5.2 % (ref 4.8–5.6)
Mean Plasma Glucose: 102.54 mg/dL

## 2019-05-17 LAB — TROPONIN I (HIGH SENSITIVITY): Troponin I (High Sensitivity): <2 ng/L (ref <18)

## 2019-05-17 LAB — TSH: TSH: 0.282 u[IU]/mL — ABNORMAL LOW (ref 0.350–4.500)

## 2019-05-17 SURGERY — LEFT HEART CATH AND CORONARY ANGIOGRAPHY
Anesthesia: LOCAL

## 2019-05-17 MED ORDER — LIDOCAINE HCL (PF) 1 % IJ SOLN
INTRAMUSCULAR | Status: AC
  Filled 2019-05-17: qty 30

## 2019-05-17 MED ORDER — HEPARIN (PORCINE) IN NACL 1000-0.9 UT/500ML-% IV SOLN
INTRAVENOUS | Status: DC | PRN
  Administered 2019-05-17: 500 mL

## 2019-05-17 MED ORDER — ONDANSETRON HCL 4 MG/2ML IJ SOLN
INTRAMUSCULAR | Status: AC
  Filled 2019-05-17: qty 2

## 2019-05-17 MED ORDER — HYDRALAZINE HCL 20 MG/ML IJ SOLN: 10.0000 mg | INTRAMUSCULAR | Status: AC | PRN

## 2019-05-17 MED ORDER — ACETAMINOPHEN 325 MG PO TABS: 650.0000 mg | ORAL_TABLET | ORAL | Status: DC | PRN

## 2019-05-17 MED ORDER — ROSUVASTATIN CALCIUM 5 MG PO TABS
10.0000 mg | ORAL_TABLET | Freq: Every day | ORAL | Status: DC
  Administered 2019-05-17: 10 mg via ORAL
  Filled 2019-05-17: qty 2

## 2019-05-17 MED ORDER — VERAPAMIL HCL 2.5 MG/ML IV SOLN
INTRAVENOUS | Status: DC | PRN
  Administered 2019-05-17: 13:00:00 10 mL via INTRA_ARTERIAL

## 2019-05-17 MED ORDER — ASPIRIN EC 81 MG PO TBEC
81.0000 mg | DELAYED_RELEASE_TABLET | Freq: Every day | ORAL | Status: DC
  Administered 2019-05-17 – 2019-05-18 (×2): 81 mg via ORAL
  Filled 2019-05-17 (×2): qty 1

## 2019-05-17 MED ORDER — LIDOCAINE HCL (PF) 1 % IJ SOLN
INTRAMUSCULAR | Status: DC | PRN
  Administered 2019-05-17: 3 mL

## 2019-05-17 MED ORDER — FENTANYL CITRATE (PF) 100 MCG/2ML IJ SOLN
INTRAMUSCULAR | Status: AC
  Filled 2019-05-17: qty 2

## 2019-05-17 MED ORDER — SODIUM CHLORIDE 0.9 % WEIGHT BASED INFUSION: 1.0000 mL/kg/h | INTRAVENOUS | Status: DC

## 2019-05-17 MED ORDER — PANTOPRAZOLE SODIUM 40 MG PO TBEC
40.0000 mg | DELAYED_RELEASE_TABLET | Freq: Every day | ORAL | Status: DC
  Administered 2019-05-17 – 2019-05-18 (×2): 40 mg via ORAL
  Filled 2019-05-17 (×2): qty 1

## 2019-05-17 MED ORDER — BUPROPION HCL 100 MG PO TABS
100.0000 mg | ORAL_TABLET | Freq: Two times a day (BID) | ORAL | Status: DC
  Administered 2019-05-17 (×2): 100 mg via ORAL
  Filled 2019-05-17 (×8): qty 1

## 2019-05-17 MED ORDER — ACETAMINOPHEN 325 MG PO TABS
650.0000 mg | ORAL_TABLET | ORAL | Status: DC | PRN
  Administered 2019-05-17 (×3): 650 mg via ORAL
  Filled 2019-05-17 (×3): qty 2

## 2019-05-17 MED ORDER — ALPRAZOLAM 0.25 MG PO TABS
0.2500 mg | ORAL_TABLET | Freq: Two times a day (BID) | ORAL | Status: DC | PRN
  Administered 2019-05-17: 0.25 mg via ORAL
  Filled 2019-05-17: qty 1

## 2019-05-17 MED ORDER — SODIUM CHLORIDE 0.9% FLUSH
3.0000 mL | Freq: Two times a day (BID) | INTRAVENOUS | Status: DC
  Administered 2019-05-18: 3 mL via INTRAVENOUS

## 2019-05-17 MED ORDER — HEPARIN (PORCINE) IN NACL 1000-0.9 UT/500ML-% IV SOLN
INTRAVENOUS | Status: AC
  Filled 2019-05-17: qty 1000

## 2019-05-17 MED ORDER — SODIUM CHLORIDE 0.9 % IV SOLN: 250.0000 mL | INTRAVENOUS | Status: DC | PRN

## 2019-05-17 MED ORDER — SODIUM CHLORIDE 0.9% FLUSH: 3.0000 mL | INTRAVENOUS | Status: DC | PRN

## 2019-05-17 MED ORDER — SODIUM CHLORIDE 0.9 % IV SOLN
INTRAVENOUS | Status: AC | PRN
  Administered 2019-05-17: 10 mL/h via INTRAVENOUS

## 2019-05-17 MED ORDER — ALBUTEROL SULFATE (2.5 MG/3ML) 0.083% IN NEBU: 2.5000 mg | INHALATION_SOLUTION | Freq: Four times a day (QID) | RESPIRATORY_TRACT | Status: DC | PRN

## 2019-05-17 MED ORDER — IOHEXOL 350 MG/ML SOLN
INTRAVENOUS | Status: DC | PRN
  Administered 2019-05-17: 13:00:00 125 mL via INTRA_ARTERIAL

## 2019-05-17 MED ORDER — SODIUM CHLORIDE 0.9 % WEIGHT BASED INFUSION: 3.0000 mL/kg/h | INTRAVENOUS | Status: DC

## 2019-05-17 MED ORDER — SODIUM CHLORIDE 0.9 % IV SOLN: INTRAVENOUS | Status: AC

## 2019-05-17 MED ORDER — ONDANSETRON HCL 4 MG/2ML IJ SOLN
4.0000 mg | Freq: Four times a day (QID) | INTRAMUSCULAR | Status: DC | PRN
  Administered 2019-05-17: 4 mg via INTRAVENOUS
  Filled 2019-05-17: qty 2

## 2019-05-17 MED ORDER — HEPARIN SODIUM (PORCINE) 1000 UNIT/ML IJ SOLN
INTRAMUSCULAR | Status: DC | PRN
  Administered 2019-05-17: 5000 [IU] via INTRAVENOUS

## 2019-05-17 MED ORDER — VERAPAMIL HCL 2.5 MG/ML IV SOLN
INTRAVENOUS | Status: AC
  Filled 2019-05-17: qty 2

## 2019-05-17 MED ORDER — LEVOTHYROXINE SODIUM 25 MCG PO TABS
125.0000 ug | ORAL_TABLET | Freq: Every day | ORAL | Status: DC
  Administered 2019-05-17 – 2019-05-18 (×2): 125 ug via ORAL
  Filled 2019-05-17 (×2): qty 1

## 2019-05-17 MED ORDER — LABETALOL HCL 5 MG/ML IV SOLN: 10.0000 mg | INTRAVENOUS | Status: AC | PRN

## 2019-05-17 MED ORDER — METFORMIN HCL 500 MG PO TABS: 500.0000 mg | ORAL_TABLET | Freq: Every day | ORAL | Status: DC

## 2019-05-17 MED ORDER — MORPHINE SULFATE (PF) 2 MG/ML IV SOLN
1.0000 mg | INTRAVENOUS | Status: DC | PRN
  Administered 2019-05-17 (×2): 1 mg via INTRAVENOUS
  Filled 2019-05-17 (×2): qty 1

## 2019-05-17 MED ORDER — ENOXAPARIN SODIUM 40 MG/0.4ML ~~LOC~~ SOLN
40.0000 mg | SUBCUTANEOUS | Status: DC
  Administered 2019-05-17: 40 mg via SUBCUTANEOUS
  Filled 2019-05-17 (×2): qty 0.4

## 2019-05-17 MED ORDER — ONDANSETRON HCL 4 MG/2ML IJ SOLN
4.0000 mg | Freq: Four times a day (QID) | INTRAMUSCULAR | Status: DC | PRN
  Administered 2019-05-17: 4 mg via INTRAVENOUS

## 2019-05-17 MED ORDER — MIDAZOLAM HCL 2 MG/2ML IJ SOLN
INTRAMUSCULAR | Status: AC
  Filled 2019-05-17: qty 2

## 2019-05-17 MED ORDER — MIDAZOLAM HCL 2 MG/2ML IJ SOLN
INTRAMUSCULAR | Status: DC | PRN
  Administered 2019-05-17: 2 mg via INTRAVENOUS

## 2019-05-17 MED ORDER — INSULIN ASPART 100 UNIT/ML ~~LOC~~ SOLN: 0.0000 [IU] | Freq: Three times a day (TID) | SUBCUTANEOUS | Status: DC

## 2019-05-17 SURGICAL SUPPLY — 11 items
CATH 5FR JL3.5 JR4 ANG PIG MP (CATHETERS) ×1 IMPLANT
CATH INFINITI 5FR AL1 (CATHETERS) ×1 IMPLANT
DEVICE RAD COMP TR BAND LRG (VASCULAR PRODUCTS) ×1 IMPLANT
GLIDESHEATH SLEND SS 6F .021 (SHEATH) ×1 IMPLANT
GUIDEWIRE INQWIRE 1.5J.035X260 (WIRE) IMPLANT
INQWIRE 1.5J .035X260CM (WIRE) ×2
KIT HEART LEFT (KITS) ×2 IMPLANT
PACK CARDIAC CATHETERIZATION (CUSTOM PROCEDURE TRAY) ×2 IMPLANT
SHEATH PROBE COVER 6X72 (BAG) ×1 IMPLANT
TRANSDUCER W/STOPCOCK (MISCELLANEOUS) ×2 IMPLANT
TUBING CIL FLEX 10 FLL-RA (TUBING) ×2 IMPLANT

## 2019-05-17 NOTE — Interval H&P Note (Signed)
Cath Lab Visit (complete for each Cath Lab visit)  Clinical Evaluation Leading to the Procedure:   ACS: Yes.    Non-ACS:    Anginal Classification: CCS IV  Anti-ischemic medical therapy: Minimal Therapy (1 class of medications)  Non-Invasive Test Results: No non-invasive testing performed  Prior CABG: No previous CABG      History and Physical Interval Note:  05/17/2019 12:47 PM  Kristina [name]  has presented today for surgery, with the diagnosis of unstable angina.  The various methods of treatment have been discussed with the patient and family. After consideration of risks, benefits and other options for treatment, the patient has consented to  Procedure(s): LEFT HEART CATH AND CORONARY ANGIOGRAPHY (N/A) as a surgical intervention.  The patient's history has been reviewed, patient examined, no change in status, stable for surgery.  I have reviewed the patient's chart and labs.  Questions were answered to the patient's satisfaction.     Larae Grooms

## 2019-05-17 NOTE — Progress Notes (Signed)
[facility] arrived to transport patient to [facility]. Vitals stable.

## 2019-05-17 NOTE — Consult Note (Addendum)
Cardiology Consultation:   Patient ID: Kristina [name] MRN: [id number]; DOB: 1971-03-11  Admit date: 05/16/2019 Date of Consult: 05/17/2019  Primary Care Provider: Celene Squibb, MD Primary Cardiologist: No primary care provider on file. Saw Dr. Debara [name] in 2016 Primary Electrophysiologist:  None    Patient Profile:   Kristina [name] is a 48 y.o. female with a hx of atypical chest pain who is being seen today for the evaluation of chest pain at the request of Dr. Carles [name].  History of Present Illness:   Kristina [name]  Is a 48 yo female with history of chest pain and palpitations who had a normal NST 2016. She also has DM2, HLD,previous HTN-wasearly family history CAD, gastric sleeve surgery, GERD. She comes in with chest pain after riding lawn mower 8/29 unrelieved with xanax x 2 and tylenol.Felt like she may have pulled a muscle-tightening around left breast. Awakened next am with chest pain 05/16/19 with nausea and short of breath got some relief with boyfriend's NTG but pain never totally went away. Still having low grade chest tightness. Shortness of breath with minimal exertion.She has asthma but was short of breath pulling grandson in wagon last week. Father died MI 37, brother died MI 58, sister  Died suddenly 27. Knows something is wrong and prefers a cath for definitive diagnosis.Has also been on phentermine for past 90 days with recent dose increase. Troponins negative, EKG without acute change. TSH low 0.282  Heart Pathway Score:  HEAR Score: 5  Past Medical History:  Diagnosis Date  . Allergic rhinitis   . Anxiety   . Asthma   . Cervical cancer ([place])   . Chronic back pain   . Chronic cough   . Depression   . Diverticulosis   . Family history of adverse reaction to anesthesia    Daughter had reaction to profolol - difficulty with breathing  . GERD (gastroesophageal reflux disease)   . History of bronchitis   . History of pneumonia   . Hx of adenomatous and sessile serrated  colonic polyps   . Hyperlipidemia   . Hypertension   . Hypothyroidism   . Lumbosacral spondylosis without myelopathy    Bulging disc - chronic pain  . Obesity    Bariatric surgery  . Obstructive sleep apnea   . Restless leg   . Type 2 diabetes mellitus ([place])   . Vitamin D deficiency   . Vocal cord dysfunction     Past Surgical History:  Procedure Laterality Date  . ABDOMINAL HYSTERECTOMY  1996  . CHOLECYSTECTOMY  1994  . COLONOSCOPY WITH PROPOFOL N/A 03/10/2015   Procedure: COLONOSCOPY WITH PROPOFOL;  Surgeon: Kristina Mayer, MD;  Location: [facility];  Service: Endoscopy;  Laterality: N/A;  . ESOPHAGOGASTRODUODENOSCOPY N/A 10/27/2013   Procedure: ESOPHAGOGASTRODUODENOSCOPY (EGD);  Surgeon: Kristina Mayer, MD;  Location: [facility];  Service: Endoscopy;  Laterality: N/A;  . KNEE ARTHROSCOPY Right   . LAPAROSCOPIC GASTRIC SLEEVE RESECTION WITH HIATAL HERNIA REPAIR N/A 09/19/2015   Procedure: LAPAROSCOPIC GASTRIC SLEEVE RESECTION WITH HIATAL HERNIA REPAIR;  Surgeon: Kristina Pickerel, MD;  Location: [facility];  Service: General;  Laterality: N/A;  . LIPOSUCTION N/A 11/26/2018   Procedure: LIPOSUCTION;  Surgeon: Kristina Going, DO;  Location: [facility];  Service: Plastics;  Laterality: N/A;  . NOSE SURGERY  2004  . ovaries removed    . PANNICULECTOMY N/A 11/26/2018   Procedure: PANNICULECTOMY;  Surgeon: Kristina Going, DO;  Location: [facility];  Service: Clinical cytogeneticist;  Laterality: N/A;  . TUBAL LIGATION    . UPPER GI ENDOSCOPY  09/19/2015   Procedure: UPPER GI ENDOSCOPY;  Surgeon: Kristina Pickerel, MD;  Location: [facility];  Service: General;;     Home Medications:  Prior to Admission medications   Medication Sig Start Date End Date Taking? Authorizing Provider  albuterol (VENTOLIN HFA) 108 (90 Base) MCG/ACT inhaler Inhale 1-2 puffs into the lungs every 6 (six) hours as needed for wheezing or shortness of breath.   Yes [provider]  buPROPion  (WELLBUTRIN) 100 MG tablet Take 100 mg by mouth 2 (two) times daily.   Yes [provider]  cyanocobalamin (,VITAMIN B-12,) 1000 MCG/ML injection Inject 1,000 mcg into the skin every 30 (thirty) days. Inject 27ml once a month. 12/07/18  Yes [provider]  dexlansoprazole (DEXILANT) 60 MG capsule Take 1 capsule (60 mg total) by mouth daily before breakfast. 04/06/18  Yes Kristina Mayer, MD  levothyroxine (SYNTHROID, LEVOTHROID) 125 MCG tablet Take 1 tablet (125 mcg total) by mouth daily. 12/22/18  Yes Kristina Jones, MD  metFORMIN (GLUCOPHAGE) 500 MG tablet Take 1 tablet (500 mg total) by mouth daily with breakfast. 01/06/19  Yes Kristina Jones, MD  venlafaxine XR (EFFEXOR-XR) 37.5 MG 24 hr capsule Take 37.5 mg by mouth daily with breakfast.   Yes [provider]  Vitamin D, Ergocalciferol, (DRISDOL) 1.25 MG (50000 UT) CAPS capsule Take 1 capsule (50,000 Units total) by mouth every 7 (seven) days. 01/19/19  Yes Kristina Jones, MD  buPROPion [facility] SR) 150 MG 12 hr tablet Take 1 tablet (150 mg total) by mouth 2 (two) times daily. Patient not taking: Reported on 05/16/2019 01/06/19   Kristina Jones, MD    Inpatient Medications: Scheduled Meds: . aspirin EC  81 mg Oral Daily  . buPROPion  100 mg Oral BID  . enoxaparin (LOVENOX) injection  40 mg Subcutaneous Q24H  . insulin aspart  0-9 Units Subcutaneous TID WC  . levothyroxine  125 mcg Oral Q0600  . pantoprazole  40 mg Oral Daily  . rosuvastatin  10 mg Oral q1800   Continuous Infusions: . [START ON 05/18/2019] sodium chloride     Followed by  . [START ON 05/18/2019] sodium chloride     PRN Meds: acetaminophen, albuterol, ALPRAZolam, morphine injection, nitroGLYCERIN, ondansetron (ZOFRAN) IV  Allergies:    Allergies  Allergen Reactions  . Ampicillin Anaphylaxis    Has patient had a PCN reaction causing immediate rash, facial/tongue/throat swelling, SOB or lightheadedness with hypotension:  Yes Has patient had a PCN reaction causing severe rash involving mucus membranes or skin necrosis: Yes Has patient had a PCN reaction that required hospitalization Yes Has patient had a PCN reaction occurring within the last 10 years: No If all of the above answers are "NO", then may proceed with Cephalosporin use.   . Codeine Hives and Shortness Of Breath  . Sulfonamide Derivatives Itching and Rash    Social History:   Social History   Socioeconomic History  . Marital status: Divorced    Spouse name: Not on file  . Number of children: 3  . Years of education: college  . Highest education level: Not on file  Occupational History  . Occupation: disabled    Fish farm manager: [employer]  Social Needs  . Financial resource strain: Not on file  . Food insecurity    Worry: Not on file    Inability: Not on file  . Transportation needs  Medical: Not on file    Non-medical: Not on file  Tobacco Use  . Smoking status: Never Smoker  . Smokeless tobacco: Never Used  Substance and Sexual Activity  . Alcohol use: No  . Drug use: No  . Sexual activity: Yes    Birth control/protection: Surgical  Lifestyle  . Physical activity    Days per week: Not on file    Minutes per session: Not on file  . Stress: Not on file  Relationships  . Social Herbalist on phone: Not on file    Gets together: Not on file    Attends religious service: Not on file    Active member of club or organization: Not on file    Attends meetings of clubs or organizations: Not on file    Relationship status: Not on file  . Intimate partner violence    Fear of current or ex partner: Not on file    Emotionally abused: Not on file    Physically abused: Not on file    Forced sexual activity: Not on file  Other Topics Concern  . 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  Emphysema Mother   . Allergies Mother   . Heart disease Mother   . Asthma Mother   . Ovarian cancer Mother   . Hypertension Mother   . Diabetes Mother   . High Cholesterol Mother   . Stroke Mother   . Thyroid disease Mother   . Depression Mother   . Anxiety disorder Mother   . Heart disease Father        deceased at age 73 from heart attack  . Sudden death Father   . High Cholesterol Father   . High blood pressure Father   . Stroke Maternal Grandmother   . Heart disease Maternal Grandmother   . Cancer Maternal Grandfather   . Heart disease Paternal Grandmother   . Multiple sclerosis Paternal Grandmother   . Heart attack Paternal Grandfather   . Thyroid disease Brother        died at 64  . Heart attack Brother        died at 51  . COPD Brother   . Colon cancer Brother 66  . Hyperthyroidism Sister   . Hypertension Brother   . Hypertension Daughter   . Allergies Child   . Asthma Son   . Asthma Brother   . Asthma Brother   . Lung cancer Maternal Aunt   . COPD Sister        died at 11  . Sudden death Sister   . Asthma Son      ROS:  Please see the history of present illness.  Review of Systems  Constitution: Positive for weight loss.  HENT: Negative.   Eyes: Negative.   Cardiovascular: Positive for chest pain.  Respiratory: Positive for wheezing.   Hematologic/Lymphatic: Negative.   Musculoskeletal: Positive for back pain and stiffness. Negative for joint pain.  Gastrointestinal: Positive for heartburn.  Genitourinary: Negative.   Neurological: Negative.     All other ROS reviewed and negative.     Physical Exam/Data:   Vitals:   05/17/19 0030 05/17/19 0100 05/17/19 0143 05/17/19 0638  BP: (!) 116/94 94/65 121/74 137/80  Pulse: 85 91 76 81  Resp: 17 (!) 21 17  16  Temp:   97.9 F (36.6 C) 98 F (36.7 C)  TempSrc:   Oral Oral  SpO2: 99% 96% 98% 97%  Weight:   102 kg   Height:   5\' 3"  (1.6 m)     Intake/Output Summary (Last 24 hours) at 05/17/2019  1014 Last data filed at 05/17/2019 0600 Gross per 24 hour  Intake 120 ml  Output 200 ml  Net -80 ml   Last 3 Weights 05/17/2019 05/16/2019 03/18/2019  Weight (lbs) 224 lb 13.9 oz 226 lb 228 lb  Weight (kg) 102 kg 102.513 kg 103.42 kg     Body mass index is 39.83 kg/m.  General:  Obese, in no acute distress HEENT: normal Lymph: no adenopathy Neck: no JVD Endocrine:  No thryomegaly Vascular: No carotid bruits; FA pulses 2+ bilaterally without bruits  Cardiac:  normal S1, S2; RRR; no murmur   Lungs:  clear to auscultation bilaterally, no wheezing, rhonchi or rales  Abd: soft, nontender, no hepatomegaly  Ext: no edema Musculoskeletal:  No deformities, BUE and BLE strength normal and equal Skin: warm and dry  Neuro:  CNs 2-12 intact, no focal abnormalities noted Psych:  Normal affect   EKG:  The EKG NSR with prolonged PR interval and nonspecific ST T changes when compared to EKG 01/2018 Telemetry:  Telemetry was personally reviewed and demonstrates: NSR  Relevant CV Studies: NST 2016 Study Highlights  Normal nuclear stress study. Low risk scan. No scar or ischemia    Laboratory Data:  High Sensitivity Troponin:   Recent Labs  Lab 05/16/19 2146 05/16/19 2320  TROPONINIHS <2.0 <2.0     Chemistry Recent Labs  Lab 05/16/19 2146 05/17/19 0447  NA 141 141  K 3.9 3.7  CL 111 110  CO2 21* 23  GLUCOSE 128* 87  BUN 11 12  CREATININE 0.66 0.58  CALCIUM 9.3 9.1  GFRNONAA >60 >60  GFRAA >60 >60  ANIONGAP 9 8    Recent Labs  Lab 05/17/19 0447  PROT 6.7  ALBUMIN 3.7  AST 13*  ALT 13  ALKPHOS 102  BILITOT 0.2*   Hematology Recent Labs  Lab 05/16/19 2146 05/17/19 0447  WBC 9.2 5.9  RBC 5.15* 4.75  HGB 12.6 11.5*  HCT 40.9 38.5  MCV 79.4* 81.1  MCH 24.5* 24.2*  MCHC 30.8 29.9*  RDW 23.4* 23.7*  PLT 224 194   BNPNo results for input(s): BNP, PROBNP in the last 168 hours.  DDimer  Recent Labs  Lab 05/16/19 2146  DDIMER 0.30     Radiology/Studies:   Dg Chest 2 View  Result Date: 05/16/2019 CLINICAL DATA:  Chest pain. EXAM: CHEST - 2 VIEW COMPARISON:  08/01/2015 FINDINGS: The cardiomediastinal contours are normal. The lungs are clear. Pulmonary vasculature is normal. No consolidation, pleural effusion, or pneumothorax. No acute osseous abnormalities are seen. Mild degenerative change in the spine. IMPRESSION: No acute findings. Electronically Signed   By: Keith Rake M.D.   On: 05/16/2019 22:07    Assessment and Plan:   Chest pain with multiple CV risk factors Troponins negative x 2, EKG without acute change. Normal NST 2016 but multiple family members with sudden death 30-40's. Will transfer for cardiac cath for definitive diagnosis.I have reviewed the risks, indications, and alternatives to angioplasty and stenting with the patient. Risks include but are not limited to bleeding, infection, vascular injury, stroke, myocardial infection, arrhythmia, kidney injury, radiation-related injury in the case of prolonged fluoroscopy use, emergency cardiac surgery, and death. The patient  understands the risks of serious complication is low (123456) and patient agrees to proceed.   DM2 HLD Early family history of CAD-see HPI for details Obesity s/p gastric sleeve surgery-on phentermine for past 90 days which could be contributing. Recommend she stop. HTN-was on 5 meds at one point but off after 100 lb weight loss. Was high last week 160/110.     For questions or updates, please contact [place] Please consult [email or web address] for contact info under    Signed, Ermalinda Barrios, PA-C 05/17/2019 10:14 AM   Attending note:  Patient seen and examined.  I reviewed her records and discussed the case with Ms. Bonnell Public PA-C.  Ms. Megee is a 48 year old woman with a history of obesity status post bariatric surgery, type 2 diabetes mellitus, hypertension, hyperlipidemia, and family history of premature cardiovascular disease.  She presents with recent  onset, prolonged and somewhat atypical chest discomfort, although associated with a worsening exertional component and also relief with nitroglycerin.  She underwent previous cardiac testing with negative Myoview back in 2016.  On examination this morning she appears comfortable at rest.  She is afebrile, systolic blood pressure ranging 100-130, heart rate in the 70s in sinus rhythm by telemetry which I personally reviewed.  Lungs are clear without labored breathing.  Cardiac exam reveals RRR without gallop or rub.  Extremities show no pitting edema.  Lab work shows potassium 3.7, BUN 12, creatinine 0.58, LDL 138, high-sensitivity troponin I levels normal, hemoglobin 11.5, platelets 194, d-dimer 0.30, TSH 0.282, SARS coronavirus 2 test negative.  Chest x-ray reports no acute process.  Her ECG which shows sinus rhythm with prolonged PR interval, possible left atrial enlargement.  Patient presents with recent onset atypical chest pain concerning for unstable angina in light of high pretest probability of cardiovascular disease as discussed above.  Cardiac enzymes are normal.  Diagnostic options discussed with the patient and after reviewing the risks and benefits, she is in agreement to proceed with a diagnostic cardiac catheterization following transfer to [facility].  Satira Sark, M.D., F.A.C.C.

## 2019-05-17 NOTE — Progress Notes (Signed)
PROGRESS NOTE  Kristina [name] [id number] DOB: [birth year] DOA: 05/16/2019 PCP: Celene Squibb, MD  Brief History:  48 year old female with a history of diabetes mellitus type 2, depression, hypothyroidism, GERD, gastric sleeve surgery, anxiety/depression, hyperlipidemia presenting with chest discomfort that began while she was on her riding lawnmower on 05/15/2019.  The patient had some associated shortness of breath and nausea.  This was intermittent throughout the afternoon on 05/15/2019.  The patient took 2 Xanax that she had leftover from a previous prescription and acetaminophen without much relief.  However, the patient woke up on the morning of 05/16/2019 with chest discomfort.  She described it as a pressure-like over her left breast with some nausea and shortness of breath.  She stated that it was constant throughout the day on 05/16/2019.  She took some nitroglycerin from her boyfriend which she states gave her some relief of her chest discomfort.  She felt that whenever she had some minimal exertion, she would have some chest discomfort.  However, over the past month or 2 months, the patient has not had any chest discomfort or exertional dyspnea.  She denies any new medications. In the emergency department, the patient was afebrile hemodynamically stable saturating 98% on room air.  BMP, LFTs, and CBC were unremarkable.  Troponin was negative <2 x 2.  EKG showed sinus rhythm with nonspecific T wave change  Assessment/Plan: Chest pain -Atypical by clinical history -Patient has strong family history of premature coronary disease in her father and brother -unclear if phentermine may be contributing -Echocardiogram -Check d-dimer--0.30 -Troponins negative x2 -Personally reviewed EKG--sinus rhythm, nonspecific T wave change -Personally reviewed chest x-ray--no edema or consolidation -continue ASA  Diabetes mellitus type 2, controlled -Holding metformin -NovoLog sliding scale  -A1C--pending  Hypothyroidism -Continue Synthroid  Morbid obesity -BMI 39.8 -Patient last filled phentermine prescription on 04/19/2019  Hyperlipidemia -continue statin       Disposition Plan:   Home when cleared by cardiology Family Communication:   No Family at bedside  Consultants:  cardiology  Code Status:  FULL  DVT Prophylaxis:   [place] Lovenox   Procedures: As Listed in Progress Note Above  Antibiotics: None   Total time spent 35 minutes.  Greater than 50% spent face to face counseling and coordinating care.   Subjective: Patient has some chest discomfort this morning but denies any shortness breath, nausea, vomiting, abdominal pain.  There is no fevers, chills, hemoptysis.,  Coughing  Objective: Vitals:   05/17/19 0030 05/17/19 0100 05/17/19 0143 05/17/19 0638  BP: (!) 116/94 94/65 121/74 137/80  Pulse: 85 91 76 81  Resp: 17 (!) 21 17 16   Temp:   97.9 F (36.6 C) 98 F (36.7 C)  TempSrc:   Oral Oral  SpO2: 99% 96% 98% 97%  Weight:   102 kg   Height:   5\' 3"  (1.6 m)     Intake/Output Summary (Last 24 hours) at 05/17/2019 0734 Last data filed at 05/17/2019 0600 Gross per 24 hour  Intake 120 ml  Output 200 ml  Net -80 ml   Weight change:  Exam:   General:  Pt is alert, follows commands appropriately, not in acute distress  HEENT: No icterus, No thrush, No neck mass, [place]/AT  Cardiovascular: RRR, S1/S2, no rubs, no gallops  Respiratory: CTA bilaterally, no wheezing, no crackles, no rhonchi  Abdomen: Soft/+BS, non tender, non distended, no guarding  Extremities: No edema, No lymphangitis, No petechiae, No rashes,  no synovitis   Data Reviewed: I have personally reviewed following labs and imaging studies Basic Metabolic Panel: Recent Labs  Lab 05/16/19 2146 05/17/19 0447  NA 141 141  K 3.9 3.7  CL 111 110  CO2 21* 23  GLUCOSE 128* 87  BUN 11 12  CREATININE 0.66 0.58  CALCIUM 9.3 9.1  MG  --  2.1   Liver Function Tests: Recent  Labs  Lab 05/17/19 0447  AST 13*  ALT 13  ALKPHOS 102  BILITOT 0.2*  PROT 6.7  ALBUMIN 3.7   No results for input(s): LIPASE, AMYLASE in the last 168 hours. No results for input(s): AMMONIA in the last 168 hours. Coagulation Profile: No results for input(s): INR, PROTIME in the last 168 hours. CBC: Recent Labs  Lab 05/16/19 2146 05/17/19 0447  WBC 9.2 5.9  NEUTROABS  --  2.6  HGB 12.6 11.5*  HCT 40.9 38.5  MCV 79.4* 81.1  PLT 224 194   Cardiac Enzymes: No results for input(s): CKTOTAL, CKMB, CKMBINDEX, TROPONINI in the last 168 hours. BNP: Invalid input(s): POCBNP CBG: Recent Labs  Lab 05/16/19 2357  GLUCAP 113*   HbA1C: No results for input(s): HGBA1C in the last 72 hours. Urine analysis:    Component Value Date/Time   COLORURINE YELLOW 11/09/2015 [place] 11/09/2015 0633   LABSPEC 1.020 11/09/2015 0633   PHURINE 5.5 11/09/2015 0633   GLUCOSEU NEGATIVE 11/09/2015 0633   HGBUR NEGATIVE 11/09/2015 0633   BILIRUBINUR NEGATIVE 11/09/2015 0633   KETONESUR NEGATIVE 11/09/2015 0633   PROTEINUR NEGATIVE 11/09/2015 0633   UROBILINOGEN 0.2 04/09/2015 1125   NITRITE NEGATIVE 11/09/2015 0633   LEUKOCYTESUR NEGATIVE 11/09/2015 [id number]   Sepsis Labs: @LABRCNTIP (procalcitonin:4,lacticidven:4) ) Recent Results (from the past 240 hour(s))  SARS Coronavirus 2 [facility] order, Performed in [facility] hospital lab) Nasopharyngeal Nasopharyngeal Swab     Status: None   Collection Time: 05/16/19 11:56 PM   Specimen: Nasopharyngeal Swab  Result Value Ref Range Status   SARS Coronavirus 2 NEGATIVE NEGATIVE Final    Comment: (NOTE) If result is NEGATIVE SARS-CoV-2 target nucleic acids are NOT DETECTED. The SARS-CoV-2 RNA is generally detectable in upper and lower  respiratory specimens during the acute phase of infection. The lowest  concentration of SARS-CoV-2 viral copies this assay can detect is 250  copies / mL. A negative result does not preclude  SARS-CoV-2 infection  and should not be used as the sole basis for treatment or other  patient management decisions.  A negative result may occur with  improper specimen collection / handling, submission of specimen other  than nasopharyngeal swab, presence of viral mutation(s) within the  areas targeted by this assay, and inadequate number of viral copies  (<250 copies / mL). A negative result must be combined with clinical  observations, patient history, and epidemiological information. If result is POSITIVE SARS-CoV-2 target nucleic acids are DETECTED. The SARS-CoV-2 RNA is generally detectable in upper and lower  respiratory specimens dur ing the acute phase of infection.  Positive  results are indicative of active infection with SARS-CoV-2.  Clinical  correlation with patient history and other diagnostic information is  necessary to determine patient infection status.  Positive results do  not rule out bacterial infection or co-infection with other viruses. If result is PRESUMPTIVE POSTIVE SARS-CoV-2 nucleic acids MAY BE PRESENT.   A presumptive positive result was obtained on the submitted specimen  and confirmed on repeat testing.  While 2019 novel coronavirus  (SARS-CoV-2) nucleic  acids may be present in the submitted sample  additional confirmatory testing may be necessary for epidemiological  and / or clinical management purposes  to differentiate between  SARS-CoV-2 and other Sarbecovirus currently known to infect humans.  If clinically indicated additional testing with an alternate test  methodology [phone]) is advised. The SARS-CoV-2 RNA is generally  detectable in upper and lower respiratory sp ecimens during the acute  phase of infection. The expected result is Negative. Fact Sheet for Patients:  StrictlyIdeas.no Fact Sheet for Healthcare Providers: BankingDealers.co.za This test is not yet approved or cleared by the  [place] FDA and has been authorized for detection and/or diagnosis of SARS-CoV-2 by FDA under an Emergency Use Authorization (EUA).  This EUA will remain in effect (meaning this test can be used) for the duration of the COVID-19 declaration under Section 564(b)(1) of the Act, 21 U.S.C. section 360bbb-3(b)(1), unless the authorization is terminated or revoked sooner. Performed at [facility], [address], [place], [place] [address]      Scheduled Meds: . aspirin EC  81 mg Oral Daily  . buPROPion  100 mg Oral BID  . enoxaparin (LOVENOX) injection  40 mg Subcutaneous Q24H  . insulin aspart  0-9 Units Subcutaneous TID WC  . levothyroxine  125 mcg Oral Q0600  . pantoprazole  40 mg Oral Daily  . rosuvastatin  10 mg Oral q1800   Continuous Infusions:  Procedures/Studies: Dg Chest 2 View  Result Date: 05/16/2019 CLINICAL DATA:  Chest pain. EXAM: CHEST - 2 VIEW COMPARISON:  08/01/2015 FINDINGS: The cardiomediastinal contours are normal. The lungs are clear. Pulmonary vasculature is normal. No consolidation, pleural effusion, or pneumothorax. No acute osseous abnormalities are seen. Mild degenerative change in the spine. IMPRESSION: No acute findings. Electronically Signed   By: Keith Rake M.D.   On: 05/16/2019 22:07    Orson Eva, DO  [facility] Pager [phone]  If 7PM-7AM, please contact night-coverage [email or web address] Password [facility] 05/17/2019, 7:34 AM   LOS: 0 days

## 2019-05-17 NOTE — H&P (Signed)
TRH H&P    Patient Demographics:    Kristina [name], is a 48 y.o. female  MRN: [id number]  DOB - October 09, 1970  Admit Date - 05/16/2019  Referring MD/NP/PA: PA Tammy Triplett  Outpatient Primary MD for the patient is Celene Squibb, MD  Patient coming from: Home  Chief complaint-chest pain   HPI:    Kristina [name]  is a 48 y.o. female, with history of diabetes mellitus type 2, depression, hypothyroidism, obesity status post gastric sleeve, GERD presents to the ED today with chest pain.  She reports that the chest pain started yesterday after she was mowing the lawn on a riding lawnmower.  The chest pain has waxed and waned since then.  Is worse with exertion.  Better with rest and nitro.  The pain is under her left breast and radiates laterally.  Pain is an 8 out of 10 at its most intense and a 4 out of 10 after nitro.  Patient reports that in addition to the pain she had fatigue and chest tightness when she woke up this morning that continued on for the whole day.  She tried to take 2 Xanax and 2 Tylenol and the pain was not relieved.  She does not think that she is had an of past event like this.  She has seen cardiology in the past when she was having hypertension issues and a new heart murmur diagnosed.  She reports that she had work-up with stress test at that time.  Patient has not had a heart attack and she has not had any stents placed.  She does have a strong family history with father dying at age 28 from an MI and brother also dying from an MI.  Patient is a non-smoker.  Patient's heart score is 5.  ED course Chemistry profile shows normal electrolytes with minimally decreased bicarb at 21, hyperglycemia 128, normal anion gap.  Patient has had high-sensitivity troponin less than 22.  Hematology reveals no anemia or leukocytosis.  Chest x-ray reveals no acute findings.  EKG shows normal sinus rhythm with a rate of  94 and a QTC of 442.  Patient is being brought in for ACS rule out observation.   Review of systems:    In addition to the HPI above,  No Fever-chills, No Headache, No changes with Vision or hearing, No problems swallowing food or Liquids, Patient admits to chest pain. No Abdominal pain, No Nausea or Vomiting, bowel movements are regular, No Blood in stool or Urine, No dysuria, No new skin rashes or bruises, No new joints pains-aches,  No new weakness, tingling, numbness in any extremity, No recent weight gain or loss, No polyuria, polydypsia or polyphagia, No significant Mental Stressors.  All other systems reviewed and are negative.    Past History of the following :    Past Medical History:  Diagnosis Date  . Allergic rhinitis   . Anxiety   . Asthma    albuterol PRN  . Bulging disc   . Cancer (HCC)    cervical cancer   .  Chronic back pain   . Chronic cough   . Depression   . Diabetes mellitus   . Diverticulosis   . Dyspnea    chronic  . Family history of adverse reaction to anesthesia    pts daughter had reaction to profolol - difficulty with breathing   . GERD (gastroesophageal reflux disease)   . Heart murmur    no problems, managed by PCP  . High cholesterol   . History of bronchitis   . Hx of adenomatous and sessile serrated colonic polyps   . Hypertension    no meds since wt loss  . Hypothyroidism   . Lumbosacral spondylosis without myelopathy    history bulging disc-chronic pain(level 3 to 8).  . Normal cardiac stress test 02/2015   with normal EF  . Numbness    feet bilat comes and goes   . Obesity   . Obstructive sleep apnea    does not need CPAP since losing wt  . Palpitations   . Pneumonia    last episode 2012  . PONV (postoperative nausea and vomiting)   . Prediabetes   . Restless leg   . Vitamin D deficiency   . Vocal cord dysfunction       Past Surgical History:  Procedure Laterality Date  . ABDOMINAL HYSTERECTOMY  1996  .  CHOLECYSTECTOMY  1994  . COLONOSCOPY WITH PROPOFOL N/A 03/10/2015   Procedure: COLONOSCOPY WITH PROPOFOL;  Surgeon: [name], MD;  Location: [facility];  Service: Endoscopy;  Laterality: N/A;  . ESOPHAGOGASTRODUODENOSCOPY N/A 10/27/2013   Procedure: ESOPHAGOGASTRODUODENOSCOPY (EGD);  Surgeon: [name], MD;  Location: [facility];  Service: Endoscopy;  Laterality: N/A;  . KNEE ARTHROSCOPY Right   . LAPAROSCOPIC GASTRIC SLEEVE RESECTION WITH HIATAL HERNIA REPAIR N/A 09/19/2015   Procedure: LAPAROSCOPIC GASTRIC SLEEVE RESECTION WITH HIATAL HERNIA REPAIR;  Surgeon: [name], MD;  Location: [facility];  Service: General;  Laterality: N/A;  . LIPOSUCTION N/A 11/26/2018   Procedure: LIPOSUCTION;  Surgeon: Wallace Going, DO;  Location: [place];  Service: Plastics;  Laterality: N/A;  . NOSE SURGERY  2004  . ovaries removed    . PANNICULECTOMY N/A 11/26/2018   Procedure: PANNICULECTOMY;  Surgeon: Wallace Going, DO;  Location: [place];  Service: Plastics;  Laterality: N/A;  . TUBAL LIGATION    . UPPER GI ENDOSCOPY  09/19/2015   Procedure: UPPER GI ENDOSCOPY;  Surgeon: [name], MD;  Location: [facility];  Service: General;;      Social History:      Social History   Tobacco Use  . Smoking status: Never Smoker  . Smokeless tobacco: Never Used  Substance Use Topics  . Alcohol use: No       Family History :     Family History  Problem Relation Age of Onset  . Emphysema Mother   . Allergies Mother   . Heart disease Mother   . Asthma Mother   . Ovarian cancer Mother   . Hypertension Mother   . Diabetes Mother   . High Cholesterol Mother   . Stroke Mother   . Thyroid disease Mother   . Depression Mother   . Anxiety disorder Mother   . Heart disease Father        deceased at age 69 from heart attack  . Sudden death Father   . High Cholesterol Father   . High blood pressure Father   . Stroke Maternal Grandmother   .  Heart  disease Maternal Grandmother   . Cancer Maternal Grandfather   . Heart disease Paternal Grandmother   . Multiple sclerosis Paternal Grandmother   . Heart attack Paternal Grandfather   . Thyroid disease Brother        died at 22  . Heart attack Brother        died at 32  . COPD Brother   . Colon cancer Brother 50  . Hyperthyroidism Sister   . Hypertension Brother   . Hypertension Daughter   . Allergies Child   . Asthma Son   . Asthma Brother   . Asthma Brother   . Lung cancer Maternal Aunt   . COPD Sister        died at 89  . Sudden death Sister   . Asthma Son       Home Medications:   Prior to Admission medications   Medication Sig Start Date End Date Taking? Authorizing Provider  albuterol (VENTOLIN HFA) 108 (90 Base) MCG/ACT inhaler Inhale 1-2 puffs into the lungs every 6 (six) hours as needed for wheezing or shortness of breath.   Yes [provider]  buPROPion (WELLBUTRIN) 100 MG tablet Take 100 mg by mouth 2 (two) times daily.   Yes [provider]  cyanocobalamin (,VITAMIN B-12,) 1000 MCG/ML injection Inject 1,000 mcg into the skin every 30 (thirty) days. Inject 28ml once a month. 12/07/18  Yes [provider]  dexlansoprazole (DEXILANT) 60 MG capsule Take 1 capsule (60 mg total) by mouth daily before breakfast. 04/06/18  Yes [name], MD  levothyroxine (SYNTHROID, LEVOTHROID) 125 MCG tablet Take 1 tablet (125 mcg total) by mouth daily. 12/22/18  Yes Eber Jones, MD  metFORMIN (GLUCOPHAGE) 500 MG tablet Take 1 tablet (500 mg total) by mouth daily with breakfast. 01/06/19  Yes Eber Jones, MD  venlafaxine XR (EFFEXOR-XR) 37.5 MG 24 hr capsule Take 37.5 mg by mouth daily with breakfast.   Yes [provider]  Vitamin D, Ergocalciferol, (DRISDOL) 1.25 MG (50000 UT) CAPS capsule Take 1 capsule (50,000 Units total) by mouth every 7 (seven) days. 01/19/19  Yes Eber Jones, MD  buPROPion [facility] SR) 150 MG 12 hr  tablet Take 1 tablet (150 mg total) by mouth 2 (two) times daily. Patient not taking: Reported on 05/16/2019 01/06/19   Eber Jones, MD     Allergies:     Allergies  Allergen Reactions  . Ampicillin Anaphylaxis    Has patient had a PCN reaction causing immediate rash, facial/tongue/throat swelling, SOB or lightheadedness with hypotension: Yes Has patient had a PCN reaction causing severe rash involving mucus membranes or skin necrosis: Yes Has patient had a PCN reaction that required hospitalization Yes Has patient had a PCN reaction occurring within the last 10 years: No If all of the above answers are "NO", then may proceed with Cephalosporin use.   . Codeine Hives and Shortness Of Breath  . Sulfonamide Derivatives Itching and Rash     Physical Exam:   Vitals  Blood pressure 114/67, pulse 87, temperature 98.4 F (36.9 C), temperature source Oral, resp. rate 16, weight 102.5 kg, SpO2 99 %.  1.  General: Patient is lying supine in bed with boyfriend at bedside in no acute distress  2. Psychiatric: A&O x3, pleasant, cooperative  3. Neurologic: Cranial nerves II through XII are grossly intact with no focal deficits on limited exam  4. HEENMT:  Head is atraumatic normocephalic, pupils are reactive to light,  minimal thyromegaly without palpable nodule, trachea is midline  5. Respiratory : Lungs are clear to auscultation bilaterally  6. Cardiovascular : H RRR  7. Gastrointestinal:  Abdomen is soft nondistended nontender to palpation  8. Skin:  No lesions or rashes on limited skin exam  9.Musculoskeletal:  1+ pitting edema in the lower extremities bilaterally    Data Review:    CBC Recent Labs  Lab 05/16/19 2146  WBC 9.2  HGB 12.6  HCT 40.9  PLT 224  MCV 79.4*  MCH 24.5*  MCHC 30.8  RDW 23.4*   ------------------------------------------------------------------------------------------------------------------  Results for orders placed or  performed during the hospital encounter of 05/16/19 (from the past 48 hour(s))  Basic metabolic panel     Status: Abnormal   Collection Time: 05/16/19  9:46 PM  Result Value Ref Range   Sodium 141 135 - 145 mmol/L   Potassium 3.9 3.5 - 5.1 mmol/L   Chloride 111 98 - 111 mmol/L   CO2 21 (L) 22 - 32 mmol/L   Glucose, Bld 128 (H) 70 - 99 mg/dL   BUN 11 6 - 20 mg/dL   Creatinine, Ser 0.66 0.44 - 1.00 mg/dL   Calcium 9.3 8.9 - 10.3 mg/dL   GFR calc non Af Amer >60 >60 mL/min   GFR calc Af Amer >60 >60 mL/min   Anion gap 9 5 - 15    Comment: Performed at [facility], [address] [place], [place] [address]  CBC     Status: Abnormal   Collection Time: 05/16/19  9:46 PM  Result Value Ref Range   WBC 9.2 4.0 - 10.5 K/uL   RBC 5.15 (H) 3.87 - 5.11 MIL/uL   Hemoglobin 12.6 12.0 - 15.0 g/dL   HCT 40.9 36.0 - 46.0 %   MCV 79.4 (L) 80.0 - 100.0 fL   MCH 24.5 (L) 26.0 - 34.0 pg   MCHC 30.8 30.0 - 36.0 g/dL   RDW 23.4 (H) 11.5 - 15.5 %   Platelets 224 150 - 400 K/uL   nRBC 0.0 0.0 - 0.2 %    Comment: Performed at [facility], [address], [place], [place] [address]  Troponin I (High Sensitivity)     Status: None   Collection Time: 05/16/19  9:46 PM  Result Value Ref Range   Troponin I (High Sensitivity) <2.0 <18 ng/L    Comment: Performed at [facility], [address] [place], [place] [address]  D-dimer, quantitative (not at [facility])     Status: None   Collection Time: 05/16/19  9:46 PM  Result Value Ref Range   D-Dimer, Quant 0.30 0.00 - 0.50 ug/mL-FEU    Comment: (NOTE) At the manufacturer cut-off of 0.50 ug/mL FEU, this assay has been documented to exclude PE with a sensitivity and negative predictive value of 97 to 99%.  At this time, this assay has not been approved by the FDA to exclude DVT/VTE. Results should be correlated with clinical presentation. Performed at [facility], [address], [place], [place] [address]   Troponin I (High Sensitivity)     Status: None    Collection Time: 05/16/19 11:20 PM  Result Value Ref Range   Troponin I (High Sensitivity) <2.0 <18 ng/L    Comment: Performed at [facility], [address] [place], [place] [address]  CBG monitoring, ED     Status: Abnormal   Collection Time: 05/16/19 11:57 PM  Result Value Ref Range   Glucose-Capillary 113 (H) 70 - 99 mg/dL  Chemistries  Recent Labs  Lab 05/16/19 2146  NA 141  K 3.9  CL 111  CO2 21*  GLUCOSE 128*  BUN 11  CREATININE 0.66  CALCIUM 9.3   ------------------------------------------------------------------------------------------------------------------  ------------------------------------------------------------------------------------------------------------------ GFR: Estimated Creatinine Clearance: 99.4 mL/min (by C-G formula based on SCr of 0.66 mg/dL). Liver Function Tests: No results for input(s): AST, ALT, ALKPHOS, BILITOT, PROT, ALBUMIN in the last 168 hours. No results for input(s): LIPASE, AMYLASE in the last 168 hours. No results for input(s): AMMONIA in the last 168 hours. Coagulation Profile: No results for input(s): INR, PROTIME in the last 168 hours. Cardiac Enzymes: No results for input(s): CKTOTAL, CKMB, CKMBINDEX, TROPONINI in the last 168 hours. BNP (last 3 results) No results for input(s): PROBNP in the last 8760 hours. HbA1C: No results for input(s): HGBA1C in the last 72 hours. CBG: Recent Labs  Lab 05/16/19 2357  GLUCAP 113*   Lipid Profile: No results for input(s): CHOL, HDL, LDLCALC, TRIG, CHOLHDL, LDLDIRECT in the last 72 hours. Thyroid Function Tests: No results for input(s): TSH, T4TOTAL, FREET4, T3FREE, THYROIDAB in the last 72 hours. Anemia Panel: No results for input(s): VITAMINB12, FOLATE, FERRITIN, TIBC, IRON, RETICCTPCT in the last 72 hours.  --------------------------------------------------------------------------------------------------------------- Urine analysis:    Component Value Date/Time    COLORURINE YELLOW 11/09/2015 [place] 11/09/2015 0633   LABSPEC 1.020 11/09/2015 0633   PHURINE 5.5 11/09/2015 0633   GLUCOSEU NEGATIVE 11/09/2015 0633   HGBUR NEGATIVE 11/09/2015 0633   BILIRUBINUR NEGATIVE 11/09/2015 0633   KETONESUR NEGATIVE 11/09/2015 0633   PROTEINUR NEGATIVE 11/09/2015 0633   UROBILINOGEN 0.2 04/09/2015 1125   NITRITE NEGATIVE 11/09/2015 0633   LEUKOCYTESUR NEGATIVE 11/09/2015 [id number]      Imaging Results:    Dg Chest 2 View  Result Date: 05/16/2019 CLINICAL DATA:  Chest pain. EXAM: CHEST - 2 VIEW COMPARISON:  08/01/2015 FINDINGS: The cardiomediastinal contours are normal. The lungs are clear. Pulmonary vasculature is normal. No consolidation, pleural effusion, or pneumothorax. No acute osseous abnormalities are seen. Mild degenerative change in the spine. IMPRESSION: No acute findings. Electronically Signed   By: Keith Rake M.D.   On: 05/16/2019 22:07    My personal review of EKG: Rhythm NSR, Rate 94/min, QTc 442,no Acute ST changes   Assessment & Plan:    Active Problems:   Chest pain   1. Chest pain 1. Troponins less than 22 2. EKG reveals no ST changes 3. Continue to monitor on telemetry 4. Continue nitro sublingual as needed 5. Repeat EKG in the morning 6. Morphine for moderate to severe pain 7. Aspirin given in ED, continue aspirin daily 8. Continue statin therapy 2. Diabetes mellitus type 2 1. Hold metformin 2. Accu-Cheks per protocol 3. Sliding scale coverage 3. Hypothyroid 1. Last TSH was normal in April 2. Recheck TSH 3. Continue Synthroid and adjust as necessary 4. Obesity 1. Patient reports that she is on diet pills -I think she is referring to the venlafaxine which is actually an antidepressant 2. Patient just recently went up on venlafaxine and this could be contributing to her chest pain 3. Hold venlafaxine 5. Depression 1. Continue Wellbutrin 6. GERD 1. Dexilant is nonformulary so continue Protonix    DVT Prophylaxis-   Lovenox - SCDs   AM Labs Ordered, also please review Full Orders  Family Communication: Admission, patients condition and plan of care including tests being ordered have been discussed with the patient and boyfriend who indicate understanding and agree with the plan and Code  Status.  Code Status: Full  Admission status: Observation: Based on patients clinical presentation and evaluation of above clinical data, I have made determination that patient meets observation criteria at this time.  Time spent in minutes : 61   Kohle Winner B Zierle-Ghosh M.D on 05/17/2019 at 12:08 AM

## 2019-05-17 NOTE — Progress Notes (Signed)
Pt is aware of everything going on with her plan of care, including the transfer to [facility] for a cardiac cath. Pt has been NPO except sips with meds since midnight. Vital signs stable, patient pain level a 3/10. Bed request put in to [facility] for cardiology services. Will continue to monitor and update patient as needed.

## 2019-05-17 NOTE — H&P (View-Only) (Signed)
Cardiology Consultation:   Patient ID: Kristina [name] MRN: [id number]; DOB: [birth year]  Admit date: 05/16/2019 Date of Consult: 05/17/2019  Primary Care Provider: Celene Squibb, MD Primary Cardiologist: No primary care provider on file. Saw Dr. Debara Pickett in 2016 Primary Electrophysiologist:  None    Patient Profile:   Kristina [name] is a 48 y.o. female with a hx of atypical chest pain who is being seen today for the evaluation of chest pain at the request of Dr. Carles Collet.  History of Present Illness:   Kristina [name]  Is a 48 yo female with history of chest pain and palpitations who had a normal NST 2016. She also has DM2, HLD,previous HTN-wasearly family history CAD, gastric sleeve surgery, GERD. She comes in with chest pain after riding lawn mower 8/29 unrelieved with xanax x 2 and tylenol.Felt like she may have pulled a muscle-tightening around left breast. Awakened next am with chest pain 05/16/19 with nausea and short of breath got some relief with boyfriend's NTG but pain never totally went away. Still having low grade chest tightness. Shortness of breath with minimal exertion.She has asthma but was short of breath pulling grandson in wagon last week. Father died MI 16, brother died MI 12, sister  Died suddenly 57. Knows something is wrong and prefers a cath for definitive diagnosis.Has also been on phentermine for past 90 days with recent dose increase. Troponins negative, EKG without acute change. TSH low 0.282  Heart Pathway Score:  HEAR Score: 5  Past Medical History:  Diagnosis Date  . Allergic rhinitis   . Anxiety   . Asthma   . Cervical cancer ([place])   . Chronic back pain   . Chronic cough   . Depression   . Diverticulosis   . Family history of adverse reaction to anesthesia    Daughter had reaction to profolol - difficulty with breathing  . GERD (gastroesophageal reflux disease)   . History of bronchitis   . History of pneumonia   . Hx of adenomatous and sessile serrated  colonic polyps   . Hyperlipidemia   . Hypertension   . Hypothyroidism   . Lumbosacral spondylosis without myelopathy    Bulging disc - chronic pain  . Obesity    Bariatric surgery  . Obstructive sleep apnea   . Restless leg   . Type 2 diabetes mellitus ([place])   . Vitamin D deficiency   . Vocal cord dysfunction     Past Surgical History:  Procedure Laterality Date  . ABDOMINAL HYSTERECTOMY  1996  . CHOLECYSTECTOMY  1994  . COLONOSCOPY WITH PROPOFOL N/A 03/10/2015   Procedure: COLONOSCOPY WITH PROPOFOL;  Surgeon: [name], MD;  Location: [facility];  Service: Endoscopy;  Laterality: N/A;  . ESOPHAGOGASTRODUODENOSCOPY N/A 10/27/2013   Procedure: ESOPHAGOGASTRODUODENOSCOPY (EGD);  Surgeon: [name], MD;  Location: [facility];  Service: Endoscopy;  Laterality: N/A;  . KNEE ARTHROSCOPY Right   . LAPAROSCOPIC GASTRIC SLEEVE RESECTION WITH HIATAL HERNIA REPAIR N/A 09/19/2015   Procedure: LAPAROSCOPIC GASTRIC SLEEVE RESECTION WITH HIATAL HERNIA REPAIR;  Surgeon: [name], MD;  Location: [facility];  Service: General;  Laterality: N/A;  . LIPOSUCTION N/A 11/26/2018   Procedure: LIPOSUCTION;  Surgeon: Wallace Going, DO;  Location: [facility];  Service: Plastics;  Laterality: N/A;  . NOSE SURGERY  2004  . ovaries removed    . PANNICULECTOMY N/A 11/26/2018   Procedure: PANNICULECTOMY;  Surgeon: Wallace Going, DO;  Location: [facility];  Service: Clinical cytogeneticist;  Laterality: N/A;  . TUBAL LIGATION    . UPPER GI ENDOSCOPY  09/19/2015   Procedure: UPPER GI ENDOSCOPY;  Surgeon: [name], MD;  Location: [facility];  Service: General;;     Home Medications:  Prior to Admission medications   Medication Sig Start Date End Date Taking? Authorizing Provider  albuterol (VENTOLIN HFA) 108 (90 Base) MCG/ACT inhaler Inhale 1-2 puffs into the lungs every 6 (six) hours as needed for wheezing or shortness of breath.   Yes [provider]  buPROPion  (WELLBUTRIN) 100 MG tablet Take 100 mg by mouth 2 (two) times daily.   Yes [provider]  cyanocobalamin (,VITAMIN B-12,) 1000 MCG/ML injection Inject 1,000 mcg into the skin every 30 (thirty) days. Inject 83ml once a month. 12/07/18  Yes [provider]  dexlansoprazole (DEXILANT) 60 MG capsule Take 1 capsule (60 mg total) by mouth daily before breakfast. 04/06/18  Yes [name], MD  levothyroxine (SYNTHROID, LEVOTHROID) 125 MCG tablet Take 1 tablet (125 mcg total) by mouth daily. 12/22/18  Yes Eber Jones, MD  metFORMIN (GLUCOPHAGE) 500 MG tablet Take 1 tablet (500 mg total) by mouth daily with breakfast. 01/06/19  Yes Eber Jones, MD  venlafaxine XR (EFFEXOR-XR) 37.5 MG 24 hr capsule Take 37.5 mg by mouth daily with breakfast.   Yes [provider]  Vitamin D, Ergocalciferol, (DRISDOL) 1.25 MG (50000 UT) CAPS capsule Take 1 capsule (50,000 Units total) by mouth every 7 (seven) days. 01/19/19  Yes Eber Jones, MD  buPROPion [facility] SR) 150 MG 12 hr tablet Take 1 tablet (150 mg total) by mouth 2 (two) times daily. Patient not taking: Reported on 05/16/2019 01/06/19   Eber Jones, MD    Inpatient Medications: Scheduled Meds: . aspirin EC  81 mg Oral Daily  . buPROPion  100 mg Oral BID  . enoxaparin (LOVENOX) injection  40 mg Subcutaneous Q24H  . insulin aspart  0-9 Units Subcutaneous TID WC  . levothyroxine  125 mcg Oral Q0600  . pantoprazole  40 mg Oral Daily  . rosuvastatin  10 mg Oral q1800   Continuous Infusions: . [START ON 05/18/2019] sodium chloride     Followed by  . [START ON 05/18/2019] sodium chloride     PRN Meds: acetaminophen, albuterol, ALPRAZolam, morphine injection, nitroGLYCERIN, ondansetron (ZOFRAN) IV  Allergies:    Allergies  Allergen Reactions  . Ampicillin Anaphylaxis    Has patient had a PCN reaction causing immediate rash, facial/tongue/throat swelling, SOB or lightheadedness with hypotension:  Yes Has patient had a PCN reaction causing severe rash involving mucus membranes or skin necrosis: Yes Has patient had a PCN reaction that required hospitalization Yes Has patient had a PCN reaction occurring within the last 10 years: No If all of the above answers are "NO", then may proceed with Cephalosporin use.   . Codeine Hives and Shortness Of Breath  . Sulfonamide Derivatives Itching and Rash    Social History:   Social History   Socioeconomic History  . Marital status: Divorced    Spouse name: Not on file  . Number of children: 3  . Years of education: college  . Highest education level: Not on file  Occupational History  . Occupation: disabled    Fish farm manager: [employer]  Social Needs  . Financial resource strain: Not on file  . Food insecurity    Worry: Not on file    Inability: Not on file  . Transportation needs  Medical: Not on file    Non-medical: Not on file  Tobacco Use  . Smoking status: Never Smoker  . Smokeless tobacco: Never Used  Substance and Sexual Activity  . Alcohol use: No  . Drug use: No  . Sexual activity: Yes    Birth control/protection: Surgical  Lifestyle  . Physical activity    Days per week: Not on file    Minutes per session: Not on file  . Stress: Not on file  Relationships  . Social Herbalist on phone: Not on file    Gets together: Not on file    Attends religious service: Not on file    Active member of club or organization: Not on file    Attends meetings of clubs or organizations: Not on file    Relationship status: Not on file  . Intimate partner violence    Fear of current or ex partner: Not on file    Emotionally abused: Not on file    Physically abused: Not on file    Forced sexual activity: Not on file  Other Topics Concern  . 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  Emphysema Mother   . Allergies Mother   . Heart disease Mother   . Asthma Mother   . Ovarian cancer Mother   . Hypertension Mother   . Diabetes Mother   . High Cholesterol Mother   . Stroke Mother   . Thyroid disease Mother   . Depression Mother   . Anxiety disorder Mother   . Heart disease Father        deceased at age 9 from heart attack  . Sudden death Father   . High Cholesterol Father   . High blood pressure Father   . Stroke Maternal Grandmother   . Heart disease Maternal Grandmother   . Cancer Maternal Grandfather   . Heart disease Paternal Grandmother   . Multiple sclerosis Paternal Grandmother   . Heart attack Paternal Grandfather   . Thyroid disease Brother        died at 42  . Heart attack Brother        died at 87  . COPD Brother   . Colon cancer Brother 19  . Hyperthyroidism Sister   . Hypertension Brother   . Hypertension Daughter   . Allergies Child   . Asthma Son   . Asthma Brother   . Asthma Brother   . Lung cancer Maternal Aunt   . COPD Sister        died at 58  . Sudden death Sister   . Asthma Son      ROS:  Please see the history of present illness.  Review of Systems  Constitution: Positive for weight loss.  HENT: Negative.   Eyes: Negative.   Cardiovascular: Positive for chest pain.  Respiratory: Positive for wheezing.   Hematologic/Lymphatic: Negative.   Musculoskeletal: Positive for back pain and stiffness. Negative for joint pain.  Gastrointestinal: Positive for heartburn.  Genitourinary: Negative.   Neurological: Negative.     All other ROS reviewed and negative.     Physical Exam/Data:   Vitals:   05/17/19 0030 05/17/19 0100 05/17/19 0143 05/17/19 0638  BP: (!) 116/94 94/65 121/74 137/80  Pulse: 85 91 76 81  Resp: 17 (!) 21 17  16  Temp:   97.9 F (36.6 C) 98 F (36.7 C)  TempSrc:   Oral Oral  SpO2: 99% 96% 98% 97%  Weight:   102 kg   Height:   5\' 3"  (1.6 m)     Intake/Output Summary (Last 24 hours) at 05/17/2019  1014 Last data filed at 05/17/2019 0600 Gross per 24 hour  Intake 120 ml  Output 200 ml  Net -80 ml   Last 3 Weights 05/17/2019 05/16/2019 03/18/2019  Weight (lbs) 224 lb 13.9 oz 226 lb 228 lb  Weight (kg) 102 kg 102.513 kg 103.42 kg     Body mass index is 39.83 kg/m.  General:  Obese, in no acute distress HEENT: normal Lymph: no adenopathy Neck: no JVD Endocrine:  No thryomegaly Vascular: No carotid bruits; FA pulses 2+ bilaterally without bruits  Cardiac:  normal S1, S2; RRR; no murmur   Lungs:  clear to auscultation bilaterally, no wheezing, rhonchi or rales  Abd: soft, nontender, no hepatomegaly  Ext: no edema Musculoskeletal:  No deformities, BUE and BLE strength normal and equal Skin: warm and dry  Neuro:  CNs 2-12 intact, no focal abnormalities noted Psych:  Normal affect   EKG:  The EKG NSR with prolonged PR interval and nonspecific ST T changes when compared to EKG 01/2018 Telemetry:  Telemetry was personally reviewed and demonstrates: NSR  Relevant CV Studies: NST 2016 Study Highlights  Normal nuclear stress study. Low risk scan. No scar or ischemia    Laboratory Data:  High Sensitivity Troponin:   Recent Labs  Lab 05/16/19 2146 05/16/19 2320  TROPONINIHS <2.0 <2.0     Chemistry Recent Labs  Lab 05/16/19 2146 05/17/19 0447  NA 141 141  K 3.9 3.7  CL 111 110  CO2 21* 23  GLUCOSE 128* 87  BUN 11 12  CREATININE 0.66 0.58  CALCIUM 9.3 9.1  GFRNONAA >60 >60  GFRAA >60 >60  ANIONGAP 9 8    Recent Labs  Lab 05/17/19 0447  PROT 6.7  ALBUMIN 3.7  AST 13*  ALT 13  ALKPHOS 102  BILITOT 0.2*   Hematology Recent Labs  Lab 05/16/19 2146 05/17/19 0447  WBC 9.2 5.9  RBC 5.15* 4.75  HGB 12.6 11.5*  HCT 40.9 38.5  MCV 79.4* 81.1  MCH 24.5* 24.2*  MCHC 30.8 29.9*  RDW 23.4* 23.7*  PLT 224 194   BNPNo results for input(s): BNP, PROBNP in the last 168 hours.  DDimer  Recent Labs  Lab 05/16/19 2146  DDIMER 0.30     Radiology/Studies:   Dg Chest 2 View  Result Date: 05/16/2019 CLINICAL DATA:  Chest pain. EXAM: CHEST - 2 VIEW COMPARISON:  08/01/2015 FINDINGS: The cardiomediastinal contours are normal. The lungs are clear. Pulmonary vasculature is normal. No consolidation, pleural effusion, or pneumothorax. No acute osseous abnormalities are seen. Mild degenerative change in the spine. IMPRESSION: No acute findings. Electronically Signed   By: Keith Rake M.D.   On: 05/16/2019 22:07    Assessment and Plan:   Chest pain with multiple CV risk factors Troponins negative x 2, EKG without acute change. Normal NST 2016 but multiple family members with sudden death 30-40's. Will transfer for cardiac cath for definitive diagnosis.I have reviewed the risks, indications, and alternatives to angioplasty and stenting with the patient. Risks include but are not limited to bleeding, infection, vascular injury, stroke, myocardial infection, arrhythmia, kidney injury, radiation-related injury in the case of prolonged fluoroscopy use, emergency cardiac surgery, and death. The patient  understands the risks of serious complication is low (123456) and patient agrees to proceed.   DM2 HLD Early family history of CAD-see HPI for details Obesity s/p gastric sleeve surgery-on phentermine for past 90 days which could be contributing. Recommend she stop. HTN-was on 5 meds at one point but off after 100 lb weight loss. Was high last week 160/110.     For questions or updates, please contact [place] Please consult [email or web address] for contact info under    Signed, Ermalinda Barrios, PA-C 05/17/2019 10:14 AM   Attending note:  Patient seen and examined.  I reviewed her records and discussed the case with Ms. Bonnell Public PA-C.  Ms. Polick is a 48 year old woman with a history of obesity status post bariatric surgery, type 2 diabetes mellitus, hypertension, hyperlipidemia, and family history of premature cardiovascular disease.  She presents with recent  onset, prolonged and somewhat atypical chest discomfort, although associated with a worsening exertional component and also relief with nitroglycerin.  She underwent previous cardiac testing with negative Myoview back in 2016.  On examination this morning she appears comfortable at rest.  She is afebrile, systolic blood pressure ranging 100-130, heart rate in the 70s in sinus rhythm by telemetry which I personally reviewed.  Lungs are clear without labored breathing.  Cardiac exam reveals RRR without gallop or rub.  Extremities show no pitting edema.  Lab work shows potassium 3.7, BUN 12, creatinine 0.58, LDL 138, high-sensitivity troponin I levels normal, hemoglobin 11.5, platelets 194, d-dimer 0.30, TSH 0.282, SARS coronavirus 2 test negative.  Chest x-ray reports no acute process.  Her ECG which shows sinus rhythm with prolonged PR interval, possible left atrial enlargement.  Patient presents with recent onset atypical chest pain concerning for unstable angina in light of high pretest probability of cardiovascular disease as discussed above.  Cardiac enzymes are normal.  Diagnostic options discussed with the patient and after reviewing the risks and benefits, she is in agreement to proceed with a diagnostic cardiac catheterization following transfer to [facility].  Satira Sark, M.D., F.A.C.C.

## 2019-05-17 NOTE — Progress Notes (Signed)
Patient complaining of nausea and last received 4 mg dose of zofran at 1023 today. Patient has post cath order for 4 mg zofran PRN every 6 hours. Confirmed with Reino Bellis, NP that zofran could be administered at this time.

## 2019-05-17 NOTE — Progress Notes (Signed)
*  PRELIMINARY RESULTS* Echocardiogram 2D Echocardiogram has been performed.  Samuel Germany 05/17/2019, 10:55 AM

## 2019-05-18 ENCOUNTER — Encounter (HOSPITAL_COMMUNITY): Payer: Self-pay | Admitting: Interventional Cardiology

## 2019-05-18 DIAGNOSIS — E782 Mixed hyperlipidemia: Secondary | ICD-10-CM

## 2019-05-18 DIAGNOSIS — R072 Precordial pain: Secondary | ICD-10-CM

## 2019-05-18 LAB — GLUCOSE, CAPILLARY: Glucose-Capillary: 116 mg/dL — ABNORMAL HIGH (ref 70–99)

## 2019-05-18 LAB — HIV ANTIBODY (ROUTINE TESTING W REFLEX): HIV Screen 4th Generation wRfx: NONREACTIVE

## 2019-05-18 MED ORDER — ROSUVASTATIN CALCIUM 10 MG PO TABS: 10.0000 mg | ORAL_TABLET | Freq: Every day | ORAL | 12 refills | Status: DC

## 2019-05-18 MED FILL — Fentanyl Citrate Preservative Free (PF) Inj 100 MCG/2ML: INTRAMUSCULAR | Qty: 2 | Status: AC

## 2019-05-18 NOTE — Discharge Summary (Addendum)
Discharge Summary    Patient ID: Kristina [name],  MRN: [id number], DOB/AGE: [birth year] 48 y.o.  Admit date: 05/16/2019 Discharge date: 05/18/2019  Primary Care Provider: Celene Squibb Primary Cardiologist: No primary care provider on file.  Discharge Diagnoses    Active Problems:   Morbid obesity (HCC)   Chest pain   Hyperlipidemia   Coronary artery disease involving native coronary artery of native heart with unstable angina pectoris (HCC)   Allergies Allergies  Allergen Reactions   Ampicillin Anaphylaxis    Has patient had a PCN reaction causing immediate rash, facial/tongue/throat swelling, SOB or lightheadedness with hypotension: Yes Has patient had a PCN reaction causing severe rash involving mucus membranes or skin necrosis: Yes Has patient had a PCN reaction that required hospitalization Yes Has patient had a PCN reaction occurring within the last 10 years: No If all of the above answers are "NO", then may proceed with Cephalosporin use.    Codeine Hives and Shortness Of Breath   Sulfonamide Derivatives Itching and Rash    Diagnostic Studies/Procedures    Cath: 05/17/19   The left ventricular systolic function is normal.  LV end diastolic pressure is normal.  The left ventricular ejection fraction is 55-65% by visual estimate.  There is no aortic valve stenosis.   No significant CAD.  Continue preventive therapy.  TTE: 05/17/19  IMPRESSIONS    1. The left ventricle has normal systolic function with an ejection fraction of 60-65%. The cavity size was normal. Left ventricular diastolic Doppler parameters are consistent with impaired relaxation. No evidence of left ventricular regional wall  motion abnormalities.  2. The right ventricle has normal systolic function. The cavity was normal. There is no increase in right ventricular wall thickness. Right ventricular systolic pressure could not be assessed.  3. The mitral valve is grossly normal.  4.  The tricuspid valve is grossly normal.  5. The aortic valve is tricuspid. Aortic valve regurgitation is trivial by color flow Doppler.  6. The aorta is abnormal unless otherwise noted.  7. There is moderate dilatation of the aortic root. Suggest dedicated imaging such as CTA for further evaluation. _____________   History of Present Illness     Kristina [name]  is a 48 yo female with history of chest pain and palpitations who had a normal NST 2016. She also has DM2, HLD,previous HTN with early family history CAD, gastric sleeve surgery, GERD. She comes in with chest pain after riding lawn mower 8/29 unrelieved with xanax x 2 and tylenol. Felt like she may have pulled a muscle-tightening around left breast. Awakened next am with chest pain 05/16/19 with nausea and short of breath got some relief with boyfriend's NTG but pain never totally went away. Still having low grade chest tightness at the time of assessment. Shortness of breath with minimal exertion. She has asthma but was short of breath pulling grandson in wagon last week. Father died MI 19, brother died MI 62, sister died suddenly 56. Felt like something was wrong and prefered a cath for definitive diagnosis. Had also been on phentermine for past 90 days with recent dose increase. Troponins negative, EKG without acute change. TSH low 0.282  Hospital Course     She was transferred to [facility] for cardiac cath. HsT remained negative. Underwent cardiac cath noted above with no CAD noted. Echo with normal EF and no rWMA. Did note a moderately dilated aortic root with suggestion for follow up CTA for further evaluation. TSH  was noted to be low at 0.282, but reported that she was being seen at a weight loss clinic and had adjusted her synthroid, but reduced back to 163mcg when her TSH was to low. Have recommended that she follow up with her PCP regarding further monitoring. Advised against further use of phentermine which she agrees to. Suggested that  she follow up with GI as an outpatient as well as she sees Dr. Carlean Purl.    General: Well developed, well nourished, female appearing in no acute distress. Head: Normocephalic, atraumatic.  Neck: Supple without bruits, JVD. Lungs:  Resp regular and unlabored, CTA. Heart: RRR, S1, S2, no S3, S4, or murmur; no rub. Abdomen: Soft, non-tender, non-distended with normoactive bowel sounds. No hepatomegaly. No rebound/guarding. No obvious abdominal masses. Extremities: No clubbing, cyanosis, edema. Distal pedal pulses are 2+ bilaterally. Right radial cath site stable without bruising or hematoma Neuro: Alert and oriented X 3. Moves all extremities spontaneously. Psych: Normal affect.  ALICEN DAI was seen by Dr. Gwenlyn Found and determined stable for discharge home. Follow up in the office has been arranged. Medications are listed below.   _____________  Discharge Vitals Blood pressure 116/67, pulse 81, temperature 97.8 F (36.6 C), temperature source Oral, resp. rate 18, height 5\' 3"  (1.6 m), weight 102.5 kg, SpO2 97 %.  Filed Weights   05/17/19 0143 05/17/19 1610 05/18/19 0605  Weight: 102 kg 102.5 kg 102.5 kg    Labs & Radiologic Studies    CBC Recent Labs    05/16/19 2146 05/17/19 0447  WBC 9.2 5.9  NEUTROABS  --  2.6  HGB 12.6 11.5*  HCT 40.9 38.5  MCV 79.4* 81.1  PLT 224 [id number]   Basic Metabolic Panel Recent Labs    05/16/19 2146 05/17/19 0447  NA 141 141  K 3.9 3.7  CL 111 110  CO2 21* 23  GLUCOSE 128* 87  BUN 11 12  CREATININE 0.66 0.58  CALCIUM 9.3 9.1  MG  --  2.1   Liver Function Tests Recent Labs    05/17/19 0447  AST 13*  ALT 13  ALKPHOS 102  BILITOT 0.2*  PROT 6.7  ALBUMIN 3.7   No results for input(s): LIPASE, AMYLASE in the last 72 hours. Cardiac Enzymes No results for input(s): CKTOTAL, CKMB, CKMBINDEX, TROPONINI in the last 72 hours. BNP Invalid input(s): POCBNP D-Dimer Recent Labs    05/16/19 2146  DDIMER 0.30   Hemoglobin A1C Recent  Labs    05/17/19 0447  HGBA1C 5.2   Fasting Lipid Panel Recent Labs    05/17/19 0447  CHOL 205*  HDL 44  LDLCALC 138*  TRIG 114  CHOLHDL 4.7   Thyroid Function Tests Recent Labs    05/17/19 0447  TSH 0.282*   _____________  Dg Chest 2 View  Result Date: 05/16/2019 CLINICAL DATA:  Chest pain. EXAM: CHEST - 2 VIEW COMPARISON:  08/01/2015 FINDINGS: The cardiomediastinal contours are normal. The lungs are clear. Pulmonary vasculature is normal. No consolidation, pleural effusion, or pneumothorax. No acute osseous abnormalities are seen. Mild degenerative change in the spine. IMPRESSION: No acute findings. Electronically Signed   By: Keith Rake M.D.   On: 05/16/2019 22:07   Disposition   Pt is being discharged home today in good condition.  Follow-up Plans & Appointments    Follow-up Information    Imogene Burn, PA-C Follow up on 06/08/2019.   Specialty: Cardiology Why: at 1:30pm for your follow up appt.  Contact information: [address] [place] [address] [phone]          Discharge Instructions    Diet - low sodium heart healthy   Complete by: As directed    Discharge instructions   Complete by: As directed    Radial Site Care Refer to this sheet in the next few weeks. These instructions provide you with information on caring for yourself after your procedure. Your caregiver may also give you more specific instructions. Your treatment has been planned according to current medical practices, but problems sometimes occur. Call your caregiver if you have any problems or questions after your procedure. HOME CARE INSTRUCTIONS You may shower the day after the procedure.Remove the bandage (dressing) and gently wash the site with plain soap and water.Gently pat the site dry.  Do not apply powder or lotion to the site.  Do not submerge the affected site in water for 3 to 5 days.  Inspect the site at least twice daily.  Do not flex or bend the affected  arm for 24 hours.  No lifting over 5 pounds (2.3 kg) for 5 days after your procedure.  Do not drive home if you are discharged the same day of the procedure. Have someone else drive you.  You may drive 24 hours after the procedure unless otherwise instructed by your caregiver.  What to expect: Any bruising will usually fade within 1 to 2 weeks.  Blood that collects in the tissue (hematoma) may be painful to the touch. It should usually decrease in size and tenderness within 1 to 2 weeks.  SEEK IMMEDIATE MEDICAL CARE IF: You have unusual pain at the radial site.  You have redness, warmth, swelling, or pain at the radial site.  You have drainage (other than a small amount of blood on the dressing).  You have chills.  You have a fever or persistent symptoms for more than 72 hours.  You have a fever and your symptoms suddenly get worse.  Your arm becomes pale, cool, tingly, or numb.  You have heavy bleeding from the site. Hold pressure on the site.   Increase activity slowly   Complete by: As directed        Discharge Medications     Medication List    STOP taking these medications   venlafaxine XR 37.5 MG 24 hr capsule Commonly known as: EFFEXOR-XR     TAKE these medications   albuterol 108 (90 Base) MCG/ACT inhaler Commonly known as: VENTOLIN HFA Inhale 1-2 puffs into the lungs every 6 (six) hours as needed for wheezing or shortness of breath.   buPROPion 150 MG 12 hr tablet Commonly known as: Wellbutrin SR Take 1 tablet (150 mg total) by mouth 2 (two) times daily.   cyanocobalamin 1000 MCG/ML injection Commonly known as: (VITAMIN B-12) Inject 1,000 mcg into the skin every 30 (thirty) days. Inject 24ml once a month.   dexlansoprazole 60 MG capsule Commonly known as: DEXILANT Take 1 capsule (60 mg total) by mouth daily before breakfast.   levothyroxine 125 MCG tablet Commonly known as: SYNTHROID Take 1 tablet (125 mcg total) by mouth daily.   metFORMIN 500 MG  tablet Commonly known as: GLUCOPHAGE Take 1 tablet (500 mg total) by mouth daily with breakfast.   rosuvastatin 10 MG tablet Commonly known as: CRESTOR Take 1 tablet (10 mg total) by mouth daily at 6 PM.   Vitamin D (Ergocalciferol) 1.25 MG (50000 UT) Caps capsule Commonly known as: DRISDOL Take 1 capsule (50,000 Units total) by  mouth every 7 (seven) days.       Acute coronary syndrome (MI, NSTEMI, STEMI, etc) this admission?: No.     Outstanding Labs/Studies   FLP/LFTs in 6 weeks.  Duration of Discharge Encounter   Greater than 30 minutes including physician time.  Signed, Reino Bellis NP-C 05/18/2019, 9:44 AM  Agree with note by Reino Bellis NP-C  Patient admitted with unstable angina.  Enzymes are negative.  She does have a strong family history of heart disease.  Diagnostic coronary angiography showed essentially normal coronary arteries and LV function suggesting her chest pain was noncardiac.  Her exam is benign.  Her right radial puncture site is stable.  She can be discharged home with follow-up in [place].  Lorretta Harp, M.D., [place], [facility], [place] [address]. [place], [place]  [address]  [phone] 05/18/2019 10:00 AM

## 2019-06-07 DIAGNOSIS — Z87898 Personal history of other specified conditions: Secondary | ICD-10-CM | POA: Insufficient documentation

## 2019-06-07 DIAGNOSIS — Z8249 Family history of ischemic heart disease and other diseases of the circulatory system: Secondary | ICD-10-CM | POA: Insufficient documentation

## 2019-06-07 DIAGNOSIS — I77810 Thoracic aortic ectasia: Secondary | ICD-10-CM | POA: Insufficient documentation

## 2019-06-07 NOTE — Progress Notes (Signed)
Cardiology Office Note    Date:  06/08/2019   ID:  Kristina, [name] 1970/12/08, MRN [id number]  PCP:  Celene Squibb, MD  Cardiologist: Rozann Lesches, MD EPS: None  Chief Complaint  Patient presents with  . Hospitalization Follow-up    History of Present Illness:  Kristina [name] is a 48 y.o. female with history of HTN, DM2, HLD,  chest pain, multiple family members with sudden death 57-40's, underwent cath June 08, 2019 with normal coronary arteries and LV function. Echo showed mod dilated aortic root and needs f/u CTA. Recommend she stop phentermine for weight loss.   Patient comes in for f/u. Crestor caused knee to swell and ache. Same thing on lipitor. Had f/u with PCP. Not on BP meds since weight loss. She keeps track of BP. No further chest pain. Thyroid adjusted by PCP.  Past Medical History:  Diagnosis Date  . Allergic rhinitis   . Anxiety   . Asthma   . Cervical cancer ([facility])   . Chronic back pain   . Chronic cough   . Depression   . Diverticulosis   . Family history of adverse reaction to anesthesia    Daughter had reaction to profolol - difficulty with breathing  . GERD (gastroesophageal reflux disease)   . History of bronchitis   . History of pneumonia   . Hx of adenomatous and sessile serrated colonic polyps   . Hyperlipidemia   . Hypertension   . Hypothyroidism   . Lumbosacral spondylosis without myelopathy    Bulging disc - chronic pain  . Obesity    Bariatric surgery  . Obstructive sleep apnea   . Restless leg   . Type 2 diabetes mellitus ([place])   . Vitamin D deficiency   . Vocal cord dysfunction     Past Surgical History:  Procedure Laterality Date  . ABDOMINAL HYSTERECTOMY  1996  . CHOLECYSTECTOMY  1994  . COLONOSCOPY WITH PROPOFOL N/A 03/10/2015   Procedure: COLONOSCOPY WITH PROPOFOL;  Surgeon: [name], MD;  Location: [facility];  Service: Endoscopy;  Laterality: N/A;  . ESOPHAGOGASTRODUODENOSCOPY N/A 10/27/2013   Procedure:  ESOPHAGOGASTRODUODENOSCOPY (EGD);  Surgeon: [name], MD;  Location: [facility];  Service: Endoscopy;  Laterality: N/A;  . KNEE ARTHROSCOPY Right   . LAPAROSCOPIC GASTRIC SLEEVE RESECTION WITH HIATAL HERNIA REPAIR N/A 09/19/2015   Procedure: LAPAROSCOPIC GASTRIC SLEEVE RESECTION WITH HIATAL HERNIA REPAIR;  Surgeon: [name], MD;  Location: [facility];  Service: General;  Laterality: N/A;  . LEFT HEART CATH AND CORONARY ANGIOGRAPHY N/A 06/08/19   Procedure: LEFT HEART CATH AND CORONARY ANGIOGRAPHY;  Surgeon: Jettie Booze, MD;  Location: [facility];  Service: Cardiovascular;  Laterality: N/A;  . LIPOSUCTION N/A 11/26/2018   Procedure: LIPOSUCTION;  Surgeon: Wallace Going, DO;  Location: [facility];  Service: Plastics;  Laterality: N/A;  . NOSE SURGERY  2004  . ovaries removed    . PANNICULECTOMY N/A 11/26/2018   Procedure: PANNICULECTOMY;  Surgeon: Wallace Going, DO;  Location: [facility];  Service: Plastics;  Laterality: N/A;  . TUBAL LIGATION    . UPPER GI ENDOSCOPY  09/19/2015   Procedure: UPPER GI ENDOSCOPY;  Surgeon: [name], MD;  Location: [facility];  Service: General;;    Current Medications: Current Meds  Medication Sig  . albuterol (VENTOLIN HFA) 108 (90 Base) MCG/ACT inhaler Inhale 1-2 puffs into the lungs every 6 (six) hours as needed for wheezing or shortness of  breath.  [name] buPROPion (WELLBUTRIN SR) 200 MG 12 hr tablet Take 200 mg by mouth 2 (two) times daily.  . cyanocobalamin (,VITAMIN B-12,) 1000 MCG/ML injection Inject 1,000 mcg into the skin every 30 (thirty) days. Inject 29ml once a month.  . dexlansoprazole (DEXILANT) 60 MG capsule Take 1 capsule (60 mg total) by mouth daily before breakfast.  . levothyroxine (SYNTHROID, LEVOTHROID) 125 MCG tablet Take 1 tablet (125 mcg total) by mouth daily.  . Vitamin D, Ergocalciferol, (DRISDOL) 1.25 MG (50000 UT) CAPS capsule Take 1 capsule (50,000 Units total) by mouth every 7  (seven) days.     Allergies:   Ampicillin, Codeine, and Sulfonamide derivatives   Social History   Socioeconomic History  . Marital status: Divorced    Spouse name: Not on file  . Number of children: 3  . Years of education: college  . Highest education level: Not on file  Occupational History  . Occupation: disabled    Fish farm manager: [employer]  Social Needs  . Financial resource strain: Not on file  . Food insecurity    Worry: Not on file    Inability: Not on file  . Transportation needs    Medical: Not on file    Non-medical: Not on file  Tobacco Use  . Smoking status: Never Smoker  . Smokeless tobacco: Never Used  Substance and Sexual Activity  . Alcohol use: No  . Drug use: No  . Sexual activity: Yes    Birth control/protection: Surgical  Lifestyle  . Physical activity    Days per week: Not on file    Minutes per session: Not on file  . Stress: Not on file  Relationships  . Social Herbalist on phone: Not on file    Gets together: Not on file    Attends religious service: Not on file    Active member of club or organization: Not on file    Attends meetings of clubs or organizations: Not on file    Relationship status: Not on file  Other Topics Concern  . 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 [employer]     Family History:  The patient's family history includes Allergies in her child and mother; Anxiety disorder in her mother; Asthma in her brother, brother, mother, son, and son; COPD in her brother and sister; Cancer in her maternal grandfather; Colon cancer (age of onset: 54) in her brother; Depression in her mother; Diabetes in her mother; Emphysema in her mother; Heart attack in her brother and paternal grandfather; Heart disease in her father, maternal grandmother, mother, and paternal grandmother; High Cholesterol in her father and mother; High blood pressure in her father; Hypertension in her brother,  daughter, and mother; Hyperthyroidism in her sister; Lung cancer in her maternal aunt; Multiple sclerosis in her paternal grandmother; Ovarian cancer in her mother; Stroke in her maternal grandmother and mother; Sudden death in her father and sister; Thyroid disease in her brother and mother.   ROS:   Please see the history of present illness.    ROS All other systems reviewed and are negative.   PHYSICAL EXAM:   VS:  BP 137/80   Pulse 82   Temp 97.7 F (36.5 C)   Ht 5\' 3"  (1.6 m)   Wt 229 lb (103.9 kg)   BMI 40.57 kg/m   Physical Exam  GEN: Obese, in no acute distress  Neck: no JVD, carotid  bruits, or masses Cardiac:RRR; no murmurs, rubs, or gallops  Respiratory:  clear to auscultation bilaterally, normal work of breathing GI: soft, nontender, nondistended, + BS Ext: without cyanosis, clubbing, or edema, Good distal pulses bilaterally Neuro:  Alert and Oriented x 3 Psych: euthymic mood, full affect  Wt Readings from Last 3 Encounters:  06/08/19 229 lb (103.9 kg)  05/18/19 226 lb (102.5 kg)  03/18/19 228 lb (103.4 kg)      Studies/Labs Reviewed:   EKG:  EKG is not ordered today.    Recent Labs: 05/17/2019: ALT 13; BUN 12; Creatinine, Ser 0.58; Hemoglobin 11.5; Magnesium 2.1; Platelets 194; Potassium 3.7; Sodium 141; TSH 0.282   Lipid Panel    Component Value Date/Time   CHOL 205 (H) 05/17/2019 0447   CHOL 233 (H) 02/05/2018 1055   TRIG 114 05/17/2019 0447   HDL 44 05/17/2019 0447   HDL 51 02/05/2018 1055   CHOLHDL 4.7 05/17/2019 0447   VLDL 23 05/17/2019 0447   LDLCALC 138 (H) 05/17/2019 0447   LDLCALC 151 (H) 02/05/2018 1055    Additional studies/ records that were reviewed today include:  Cath: 05/17/19    The left ventricular systolic function is normal.  LV end diastolic pressure is normal.  The left ventricular ejection fraction is 55-65% by visual estimate.  There is no aortic valve stenosis.   No significant CAD.  Continue preventive therapy.    TTE: 05/17/19   IMPRESSIONS      1. The left ventricle has normal systolic function with an ejection fraction of 60-65%. The cavity size was normal. Left ventricular diastolic Doppler parameters are consistent with impaired relaxation. No evidence of left ventricular regional wall  motion abnormalities.  2. The right ventricle has normal systolic function. The cavity was normal. There is no increase in right ventricular wall thickness. Right ventricular systolic pressure could not be assessed.  3. The mitral valve is grossly normal.  4. The tricuspid valve is grossly normal.  5. The aortic valve is tricuspid. Aortic valve regurgitation is trivial by color flow Doppler.  6. The aorta is abnormal unless otherwise noted.  7. There is moderate dilatation of the aortic root. Suggest dedicated imaging such as CTA for further evaluation. _____________     ASSESSMENT:    1. History of chest pain   2. Dilated aortic root (HCC)   3. Other specified hypothyroidism   4. Essential hypertension   5. Mixed hyperlipidemia   6. Family history of early CAD   37. Thoracic aortic aneurysm without rupture (HCC)      PLAN:  In order of problems listed above:  Chest pain-normal coronary arteries at cath.  Dilated aortic root-will schedule CTA-f/u after  Hypothyroidism Low TSH-being adjusted by PCP  HTN -BP up and down. She'll keep closer track of it. Not on meds now  HLD intolerant to statins. Refer to lipid clinic for [id number]  Family history of early CAD    Medication Adjustments/Labs and Tests Ordered: Current medicines are reviewed at length with the patient today.  Concerns regarding medicines are outlined above.  Medication changes, Labs and Tests ordered today are listed in the Patient Instructions below. Patient Instructions  Medication Instructions: Your physician recommends that you continue on your current medications as directed. Please refer to the Current Medication list given  to you today.   Labwork: None  Procedures/Testing: Schedule Chest CT  Follow-Up: Virtual PHONE visit with either Dr.McDowell or Ermalinda Barrios PA-C  Any Additional Special Instructions Will Be  Listed Below (If Applicable).   We have referred you to Lipid clinic, they will call you with aptc  If you need a refill on your cardiac medications before your next appointment, please call your pharmacy.     Thank you for choosing [facility] !           Sumner Boast, PA-C  06/08/2019 2:07 PM    [facility] [place], [place], [place]  [address] Phone: [phone]; Fax: [phone]

## 2019-06-08 ENCOUNTER — Ambulatory Visit (INDEPENDENT_AMBULATORY_CARE_PROVIDER_SITE_OTHER): Payer: Medicare Other | Admitting: Physician Assistant

## 2019-06-08 ENCOUNTER — Encounter: Payer: Self-pay | Admitting: Physician Assistant

## 2019-06-08 ENCOUNTER — Other Ambulatory Visit: Payer: Self-pay

## 2019-06-08 VITALS — BP 137/80 | HR 82 | Temp 97.7°F | Ht 63.0 in | Wt 229.0 lb

## 2019-06-08 DIAGNOSIS — I712 Thoracic aortic aneurysm, without rupture, unspecified: Secondary | ICD-10-CM

## 2019-06-08 DIAGNOSIS — I77810 Thoracic aortic ectasia: Secondary | ICD-10-CM

## 2019-06-08 DIAGNOSIS — Z8249 Family history of ischemic heart disease and other diseases of the circulatory system: Secondary | ICD-10-CM

## 2019-06-08 DIAGNOSIS — Z87898 Personal history of other specified conditions: Secondary | ICD-10-CM | POA: Diagnosis not present

## 2019-06-08 DIAGNOSIS — I10 Essential (primary) hypertension: Secondary | ICD-10-CM | POA: Diagnosis not present

## 2019-06-08 DIAGNOSIS — E038 Other specified hypothyroidism: Secondary | ICD-10-CM

## 2019-06-08 DIAGNOSIS — E782 Mixed hyperlipidemia: Secondary | ICD-10-CM

## 2019-06-08 NOTE — Patient Instructions (Addendum)
Medication Instructions: Your physician recommends that you continue on your current medications as directed. Please refer to the Current Medication list given to you today.   Labwork: None  Procedures/Testing: Schedule Chest CT  Follow-Up: Virtual PHONE visit with either Dr.McDowell or Ermalinda Barrios PA-C  Any Additional Special Instructions Will Be Listed Below (If Applicable).   We have referred you to Lipid clinic, they will call you with aptc  If you need a refill on your cardiac medications before your next appointment, please call your pharmacy.     Thank you for choosing [facility] !

## 2019-06-22 ENCOUNTER — Ambulatory Visit (HOSPITAL_COMMUNITY)
Admission: RE | Admit: 2019-06-22 | Discharge: 2019-06-22 | Disposition: A | Discharge status: Home / Self Care | Payer: Medicare Other | Source: Ambulatory Visit | Attending: Physician Assistant | Admitting: Physician Assistant

## 2019-06-22 ENCOUNTER — Other Ambulatory Visit: Payer: Self-pay

## 2019-06-22 DIAGNOSIS — I712 Thoracic aortic aneurysm, without rupture, unspecified: Secondary | ICD-10-CM

## 2019-06-22 MED ORDER — IOHEXOL 350 MG/ML SOLN
100.0000 mL | Freq: Once | INTRAVENOUS | Status: AC | PRN
  Administered 2019-06-22: 100 mL via INTRAVENOUS

## 2019-07-06 ENCOUNTER — Telehealth (INDEPENDENT_AMBULATORY_CARE_PROVIDER_SITE_OTHER): Payer: Medicare Other | Admitting: Cardiology

## 2019-07-06 ENCOUNTER — Other Ambulatory Visit: Payer: Self-pay

## 2019-07-06 ENCOUNTER — Encounter: Payer: Self-pay | Admitting: Cardiology

## 2019-07-06 DIAGNOSIS — I10 Essential (primary) hypertension: Secondary | ICD-10-CM

## 2019-07-06 DIAGNOSIS — I77810 Thoracic aortic ectasia: Secondary | ICD-10-CM | POA: Diagnosis not present

## 2019-07-06 MED ORDER — METOPROLOL SUCCINATE ER 50 MG PO TB24: 50.0000 mg | ORAL_TABLET | Freq: Every day | ORAL | 3 refills | Status: DC

## 2019-07-06 NOTE — Patient Instructions (Signed)
Medication Instructions:  START Toprol XL 50 mg daily  *If you need a refill on your cardiac medications before your next appointment, please call your pharmacy*  Lab Work: None today If you have labs (blood work) drawn today and your tests are completely normal, you will receive your results only by: [name] MyChart Message (if you have MyChart) OR . A paper copy in the mail If you have any lab test that is abnormal or we need to change your treatment, we will call you to review the results.  Testing/Procedures: None today  Follow-Up: At [facility], you and your health needs are our priority.  As part of our continuing mission to provide you with exceptional heart care, we have created designated Provider Care Teams.  These Care Teams include your primary Cardiologist (physician) and Advanced Practice Providers (APPs -  Physician Assistants and Nurse Practitioners) who all work together to provide you with the care you need, when you need it.  Your next appointment:   6 months  The format for your next appointment:   In Person  Provider:   Rozann Lesches, MD  Other Instructions  Please track your blood pressure as directed by Dr.Mcdowell     Thank you for choosing [facility] !

## 2019-07-06 NOTE — Progress Notes (Signed)
Virtual Visit via Telephone Note   This visit type was conducted due to national recommendations for restrictions regarding the COVID-19 Pandemic (e.g. social distancing) in an effort to limit this patient's exposure and mitigate transmission in our community.  Due to her co-morbid illnesses, this patient is at least at moderate risk for complications without adequate follow up.  This format is felt to be most appropriate for this patient at this time.  The patient did not have access to video technology/had technical difficulties with video requiring transitioning to audio format only (telephone).  All issues noted in this document were discussed and addressed.  No physical exam could be performed with this format.  Please refer to the patient's chart for her  consent to telehealth for [facility].   Date:  07/06/2019   ID:  Kristina [name], DOB 1971-02-10, MRN [id number]  Patient Location: Home Provider Location: Office  PCP:  Kristina Squibb, MD  Cardiologist:  Kristina Lesches, MD Electrophysiologist:  None   Evaluation Performed:  Follow-Up Visit  Chief Complaint:   Cardiac follow-up  History of Present Illness:    Kristina [name] is a 48 y.o. female last seen by [name] in September.  She has a history of chest pain but reassuring cardiac evaluation recent cardiac catheterization in August showing normal coronaries.  She was found to have a moderately dilated aortic root by echocardiography and underwent a chest CTA in early October that showed aortic root measuring 4.6 cm.  She has a history of hypertension, has not been on antihypertensive medications recently since significant weight loss and bariatric surgery.  Blood pressure trend however has been up, particular diastolics.  She has a family history of early death, states that her father is "heart ruptured" raising possibility of aneurysmal disease.  We discussed addition of beta-blocker and routine follow-up.  The  patient does not have symptoms concerning for COVID-19 infection (fever, chills, cough, or new shortness of breath).    Past Medical History:  Diagnosis Date  . Allergic rhinitis   . Anxiety   . Asthma   . Cervical cancer ([place])   . Chronic back pain   . Chronic cough   . Depression   . Diverticulosis   . Family history of adverse reaction to anesthesia    Daughter had reaction to profolol - difficulty with breathing  . GERD (gastroesophageal reflux disease)   . History of bronchitis   . History of pneumonia   . Hx of adenomatous and sessile serrated colonic polyps   . Hyperlipidemia   . Hypertension   . Hypothyroidism   . Lumbosacral spondylosis without myelopathy    Bulging disc - chronic pain  . Obesity    Bariatric surgery  . Obstructive sleep apnea   . Restless leg   . Type 2 diabetes mellitus ([place])   . Vitamin D deficiency   . Vocal cord dysfunction    Past Surgical History:  Procedure Laterality Date  . ABDOMINAL HYSTERECTOMY  1996  . CHOLECYSTECTOMY  1994  . COLONOSCOPY WITH PROPOFOL N/A 03/10/2015   Procedure: COLONOSCOPY WITH PROPOFOL;  Surgeon: [name], MD;  [facility];  Service: Endoscopy;  Laterality: N/A;  . ESOPHAGOGASTRODUODENOSCOPY N/A 10/27/2013   Procedure: ESOPHAGOGASTRODUODENOSCOPY (EGD);  Surgeon: [name], MD;  Location: [facility];  Service: Endoscopy;  Laterality: N/A;  . KNEE ARTHROSCOPY Right   . LAPAROSCOPIC GASTRIC SLEEVE RESECTION WITH HIATAL HERNIA REPAIR N/A 09/19/2015   Procedure: LAPAROSCOPIC GASTRIC  SLEEVE RESECTION WITH HIATAL HERNIA REPAIR;  Surgeon: [name], MD;  [facility];  Service: General;  Laterality: N/A;  . LEFT HEART CATH AND CORONARY ANGIOGRAPHY N/A 05/17/2019   Procedure: LEFT HEART CATH AND CORONARY ANGIOGRAPHY;  Surgeon: Jettie Booze, MD;  Location: [facility];  Service: Cardiovascular;  Laterality: N/A;  . LIPOSUCTION N/A 11/26/2018   Procedure: LIPOSUCTION;  Surgeon:  Wallace Going, DO;  Location: [facility];  Service: Plastics;  Laterality: N/A;  . NOSE SURGERY  2004  . ovaries removed    . PANNICULECTOMY N/A 11/26/2018   Procedure: PANNICULECTOMY;  Surgeon: Wallace Going, DO;  Location: [facility];  Service: Plastics;  Laterality: N/A;  . TUBAL LIGATION    . UPPER GI ENDOSCOPY  09/19/2015   Procedure: UPPER GI ENDOSCOPY;  Surgeon: [name], MD;  [facility];  Service: General;;     Current Meds  Medication Sig  . albuterol (VENTOLIN HFA) 108 (90 Base) MCG/ACT inhaler Inhale 1-2 puffs into the lungs every 6 (six) hours as needed for wheezing or shortness of breath.  [name] buPROPion (WELLBUTRIN SR) 200 MG 12 hr tablet Take 200 mg by mouth 2 (two) times daily.  [name] dexlansoprazole (DEXILANT) 60 MG capsule Take 1 capsule (60 mg total) by mouth daily before breakfast.  . levothyroxine (SYNTHROID, LEVOTHROID) 125 MCG tablet Take 1 tablet (125 mcg total) by mouth daily. (Patient taking differently: Take 112 mcg by mouth daily. )     Allergies:   Ampicillin, Codeine, and Sulfonamide derivatives   Social History   Tobacco Use  . Smoking status: Never Smoker  . Smokeless tobacco: Never Used  Substance Use Topics  . Alcohol use: No  . Drug use: No     Family Hx: The patient's family history includes Allergies in her child and mother; Anxiety disorder in her mother; Asthma in her brother, brother, mother, son, and son; COPD in her brother and sister; Cancer in her maternal grandfather; Colon cancer (age of onset: 63) in her brother; Depression in her mother; Diabetes in her mother; Emphysema in her mother; Heart attack in her brother and paternal grandfather; Heart disease in her father, maternal grandmother, mother, and paternal grandmother; High Cholesterol in her father and mother; High blood pressure in her father; Hypertension in her brother, daughter, and mother; Hyperthyroidism in her sister; Lung cancer in  her maternal aunt; Multiple sclerosis in her paternal grandmother; Ovarian cancer in her mother; Stroke in her maternal grandmother and mother; Sudden death in her father and sister; Thyroid disease in her brother and mother.  ROS:   Please see the history of present illness. All other systems reviewed and are negative.   Prior CV studies:   The following studies were reviewed today:  Cardiac catheterization 05/17/2019:  The left ventricular systolic function is normal.  LV end diastolic pressure is normal.  The left ventricular ejection fraction is 55-65% by visual estimate.  There is no aortic valve stenosis.   No significant CAD.  Continue preventive therapy.  Echocardiogram 05/17/2019:  1. The left ventricle has normal systolic function with an ejection fraction of 60-65%. The cavity size was normal. Left ventricular diastolic Doppler parameters are consistent with impaired relaxation. No evidence of left ventricular regional wall  motion abnormalities.  2. The right ventricle has normal systolic function. The cavity was normal. There is no increase in right ventricular wall thickness. Right ventricular systolic pressure could not be assessed.  3.  The mitral valve is grossly normal.  4. The tricuspid valve is grossly normal.  5. The aortic valve is tricuspid. Aortic valve regurgitation is trivial by color flow Doppler.  6. The aorta is abnormal unless otherwise noted.  7. There is moderate dilatation of the aortic root. Suggest dedicated imaging such as CTA for further evaluation.  Chest CTA 06/22/2019: IMPRESSION: 1. Dilated aortic root, 4.6 cm diameter at the sinuses of Valsalva. Otherwise normal caliber thoracic aorta as detailed. 2. No active pulmonary disease. 3. Small hiatal hernia.  Labs/Other Tests and Data Reviewed:    EKG:  An ECG dated 05/18/2019 was personally reviewed today and demonstrated:  Sinus rhythm with prolonged PR interval.  Recent Labs: 05/17/2019: ALT  13; BUN 12; Creatinine, Ser 0.58; Hemoglobin 11.5; Magnesium 2.1; Platelets 194; Potassium 3.7; Sodium 141; TSH 0.282   Recent Lipid Panel Lab Results  Component Value Date/Time   CHOL 205 (H) 05/17/2019 04:47 AM   CHOL 233 (H) 02/05/2018 10:55 AM   TRIG 114 05/17/2019 04:47 AM   HDL 44 05/17/2019 04:47 AM   HDL 51 02/05/2018 10:55 AM   CHOLHDL 4.7 05/17/2019 04:47 AM   LDLCALC 138 (H) 05/17/2019 04:47 AM   LDLCALC 151 (H) 02/05/2018 10:55 AM    Wt Readings from Last 3 Encounters:  07/06/19 225 lb (102.1 kg)  06/08/19 229 lb (103.9 kg)  05/18/19 226 lb (102.5 kg)     Objective:    Vital Signs:  BP (!) 144/96   Pulse 100   Ht 5\' 3"  (1.6 m)   Wt 225 lb (102.1 kg)   BMI 39.86 kg/m    Patient spoke in full sentences, not short of breath. No audible wheezing or coughing. Speech pattern normal.  ASSESSMENT & PLAN:    1.  Normal coronary arteries by cardiac catheterization in October.  2.  Moderately dilated aortic root, 46 mm by chest CTA in early October.  Plan to add Toprol-XL 50 mg daily for better blood pressure control and will continue with observation otherwise.  She will need a chest CTA next October for surveillance.  3.  Essential hypertension, starting Toprol-XL at 50 mg daily as outlined.  4.  History of type 2 diabetes mellitus, keep follow-up with PCP.  COVID-19 Education: The signs and symptoms of COVID-19 were discussed with the patient and how to seek care for testing (follow up with PCP or arrange E-visit).   The importance of social distancing was discussed today.  Time:   Today, I have spent 8 minutes with the patient with telehealth technology discussing the above problems.     Medication Adjustments/Labs and Tests Ordered: Current medicines are reviewed at length with the patient today.  Concerns regarding medicines are outlined above.   Tests Ordered: No orders of the defined types were placed in this encounter.   Medication Changes: Meds  ordered this encounter  Medications  . metoprolol succinate (TOPROL-XL) 50 MG 24 hr tablet    Sig: Take 1 tablet (50 mg total) by mouth daily. Take with or immediately following a meal.    Dispense:  90 tablet    Refill:  3    Follow Up:  6 months in the [place] office.   Signed, Kristina Lesches, MD  07/06/2019 4:52 PM    [place]

## 2019-11-11 ENCOUNTER — Ambulatory Visit: Payer: Medicare Other | Admitting: Cardiology

## 2019-11-12 ENCOUNTER — Ambulatory Visit: Payer: Medicare Other | Admitting: Cardiology

## 2019-12-06 ENCOUNTER — Other Ambulatory Visit: Payer: Self-pay

## 2019-12-06 ENCOUNTER — Encounter: Payer: Self-pay | Admitting: Cardiology

## 2019-12-06 ENCOUNTER — Ambulatory Visit (INDEPENDENT_AMBULATORY_CARE_PROVIDER_SITE_OTHER): Payer: Medicare Other | Admitting: Cardiology

## 2019-12-06 VITALS — BP 130/70 | HR 82 | Temp 98.5°F | Ht 63.0 in | Wt 223.0 lb

## 2019-12-06 DIAGNOSIS — I77810 Thoracic aortic ectasia: Secondary | ICD-10-CM | POA: Diagnosis not present

## 2019-12-06 DIAGNOSIS — I10 Essential (primary) hypertension: Secondary | ICD-10-CM | POA: Diagnosis not present

## 2019-12-06 MED ORDER — METOPROLOL SUCCINATE ER 25 MG PO TB24: 12.5000 mg | ORAL_TABLET | Freq: Every day | ORAL | 3 refills | Status: DC

## 2019-12-06 NOTE — Progress Notes (Signed)
Cardiology Office Note  Date: 12/06/2019   ID: Kristina [name], Kristina [name] [birth year], MRN [id number]  PCP:  Celene Squibb, MD  Cardiologist:  Rozann Lesches, MD Electrophysiologist:  None   Chief Complaint  Patient presents with  . Cardiac follow-up    History of Present Illness: Kristina [name] is a 49 y.o. female last assessed via telehealth encounter in October 2020.  She presents for a routine visit.  She states that she has been under a lot of stress, but is trying to focus on exercise and weight loss, does feel somewhat better.  She was not able to tolerate Toprol-XL 50 mg daily which we started at the last encounter.  This lowered her blood pressure too much and made her feel fatigued.  We will try a much lower dose.  We discussed her chest CTA results from October of last year, at that point aortic root was dilated at 4.6 cm.  Follow-up study is planned for later this year in October.  Past Medical History:  Diagnosis Date  . Allergic rhinitis   . Anxiety   . Aortic dilatation ([facility])   . Asthma   . Cervical cancer ([place])   . Chronic back pain   . Chronic cough   . Depression   . Diverticulosis   . Essential hypertension   . GERD (gastroesophageal reflux disease)   . History of bronchitis   . History of pneumonia   . Hx of adenomatous and sessile serrated colonic polyps   . Hyperlipidemia   . Hypothyroidism   . Lumbosacral spondylosis without myelopathy    Bulging disc - chronic pain  . Obesity    Bariatric surgery  . Obstructive sleep apnea   . Restless leg   . Type 2 diabetes mellitus ([place])   . Vitamin D deficiency   . Vocal cord dysfunction     Past Surgical History:  Procedure Laterality Date  . ABDOMINAL HYSTERECTOMY  1996  . CHOLECYSTECTOMY  1994  . COLONOSCOPY WITH PROPOFOL N/A 03/10/2015   Procedure: COLONOSCOPY WITH PROPOFOL;  Surgeon: [name], MD;  [facility];  Service: Endoscopy;  Laterality: N/A;  . ESOPHAGOGASTRODUODENOSCOPY  N/A 10/27/2013   Procedure: ESOPHAGOGASTRODUODENOSCOPY (EGD);  Surgeon: [name], MD;  Location: [facility];  Service: Endoscopy;  Laterality: N/A;  . KNEE ARTHROSCOPY Right   . LAPAROSCOPIC GASTRIC SLEEVE RESECTION WITH HIATAL HERNIA REPAIR N/A 09/19/2015   Procedure: LAPAROSCOPIC GASTRIC SLEEVE RESECTION WITH HIATAL HERNIA REPAIR;  Surgeon: [name], MD;  [facility];  Service: General;  Laterality: N/A;  . LEFT HEART CATH AND CORONARY ANGIOGRAPHY N/A 05/17/2019   Procedure: LEFT HEART CATH AND CORONARY ANGIOGRAPHY;  Surgeon: Jettie Booze, MD;  Location: [facility];  Service: Cardiovascular;  Laterality: N/A;  . LIPOSUCTION N/A 11/26/2018   Procedure: LIPOSUCTION;  Surgeon: Wallace Going, DO;  Location: [facility];  Service: Plastics;  Laterality: N/A;  . NOSE SURGERY  2004  . ovaries removed    . PANNICULECTOMY N/A 11/26/2018   Procedure: PANNICULECTOMY;  Surgeon: Wallace Going, DO;  Location: [facility];  Service: Plastics;  Laterality: N/A;  . TUBAL LIGATION    . UPPER GI ENDOSCOPY  09/19/2015   Procedure: UPPER GI ENDOSCOPY;  Surgeon: [name], MD;  [facility];  Service: General;;    Current Outpatient Medications  Medication Sig Dispense Refill  . albuterol (VENTOLIN HFA) 108 (90 Base) MCG/ACT inhaler Inhale 1-2 puffs  into the lungs every 6 (six) hours as needed for wheezing or shortness of breath.    [name] buPROPion (WELLBUTRIN SR) 200 MG 12 hr tablet Take 200 mg by mouth 2 (two) times daily.    . cyanocobalamin (,VITAMIN B-12,) 1000 MCG/ML injection INJECT 1 ML (CC) ONCE A WEEK FOR 4 WEEKS THEN ONCE A MONTH    . dexlansoprazole (DEXILANT) 60 MG capsule Take 1 capsule (60 mg total) by mouth daily before breakfast. 90 capsule 3  . ergocalciferol (VITAMIN D2) 1.25 MG (50000 UT) capsule Vitamin D2 1,250 mcg (50,000 unit) capsule  TAKE 1 CAPSULE BY MOUTH ONCE A WEEK    . levothyroxine (SYNTHROID) 112 MCG tablet  Take 112 mcg by mouth 2 (two) times daily.    [name] REPATHA SURECLICK [id number] MG/ML SOAJ Inject 1 Syringe into the skin every 14 (fourteen) days.    . metoprolol succinate (TOPROL XL) 25 MG 24 hr tablet Take 0.5 tablets (12.5 mg total) by mouth daily. 45 tablet 3   No current facility-administered medications for this visit.   Allergies:  Ampicillin, Codeine, and Sulfonamide derivatives   ROS:   No palpitations or syncope.  Physical Exam: VS:  BP 130/70   Pulse 82   Temp 98.5 F (36.9 C)   Ht 5\' 3"  (1.6 m)   Wt 223 lb (101.2 kg)   SpO2 96%   BMI 39.50 kg/m , BMI Body mass index is 39.5 kg/m.  Wt Readings from Last 3 Encounters:  12/06/19 223 lb (101.2 kg)  07/06/19 225 lb (102.1 kg)  06/08/19 229 lb (103.9 kg)    General: Patient appears comfortable at rest. HEENT: Conjunctiva and lids normal, wearing a mask. Neck: Supple, no elevated JVP or carotid bruits, no thyromegaly. Lungs: Clear to auscultation, nonlabored breathing at rest. Cardiac: Regular rate and rhythm, no S3 or significant systolic murmur, no pericardial rub. Abdomen: Soft, bowel sounds present. Extremities: No pitting edema, distal pulses 2+.  ECG:  An ECG dated 05/18/2019 was personally reviewed today and demonstrated:  Sinus rhythm with prolonged PR interval.  Recent Labwork: 05/17/2019: ALT 13; AST 13; BUN 12; Creatinine, Ser 0.58; Hemoglobin 11.5; Magnesium 2.1; Platelets 194; Potassium 3.7; Sodium 141; TSH 0.282     Component Value Date/Time   CHOL 205 (H) 05/17/2019 0447   CHOL 233 (H) 02/05/2018 1055   TRIG 114 05/17/2019 0447   HDL 44 05/17/2019 0447   HDL 51 02/05/2018 1055   CHOLHDL 4.7 05/17/2019 0447   VLDL 23 05/17/2019 0447   LDLCALC 138 (H) 05/17/2019 0447   LDLCALC 151 (H) 02/05/2018 1055    Other Studies Reviewed Today:  Cardiac catheterization 05/17/2019:  The left ventricular systolic function is normal.  LV end diastolic pressure is normal.  The left ventricular ejection fraction is  55-65% by visual estimate.  There is no aortic valve stenosis.  No significant CAD. Continue preventive therapy.  Echocardiogram 05/17/2019: 1. The left ventricle has normal systolic function with an ejection fraction of 60-65%. The cavity size was normal. Left ventricular diastolic Doppler parameters are consistent with impaired relaxation. No evidence of left ventricular regional wall  motion abnormalities. 2. The right ventricle has normal systolic function. The cavity was normal. There is no increase in right ventricular wall thickness. Right ventricular systolic pressure could not be assessed. 3. The mitral valve is grossly normal. 4. The tricuspid valve is grossly normal. 5. The aortic valve is tricuspid. Aortic valve regurgitation is trivial by color flow Doppler. 6. The aorta is  abnormal unless otherwise noted. 7. There is moderate dilatation of the aortic root. Suggest dedicated imaging such as CTA for further evaluation.  Chest CTA 06/22/2019: IMPRESSION: 1. Dilated aortic root, 4.6 cm diameter at the sinuses of Valsalva. Otherwise normal caliber thoracic aorta as detailed. 2. No active pulmonary disease. 3. Small hiatal hernia.  Assessment and Plan:  1.  Moderately dilated aortic root, 4.6 cm by chest CTA in October 2020.  We will try Toprol-XL at 12.5 mg daily (did not tolerate higher dose), and continue surveillance with a repeat chest CTA in October of this year.  2.  Normal coronary arteries by cardiac catheterization in October 2020.  3.  Essential hypertension, continue to follow, starting Toprol-XL at low-dose.  Medication Adjustments/Labs and Tests Ordered: Current medicines are reviewed at length with the patient today.  Concerns regarding medicines are outlined above.   Tests Ordered: No orders of the defined types were placed in this encounter.   Medication Changes: Meds ordered this encounter  Medications  . metoprolol succinate (TOPROL XL) 25  MG 24 hr tablet    Sig: Take 0.5 tablets (12.5 mg total) by mouth daily.    Dispense:  45 tablet    Refill:  3    Disposition:  Follow up after chest CTA in October for review.  Signed, Satira Sark, MD, [facility] 12/06/2019 10:56 AM    [facility]. [address] [place], [place] [address] Phone: [phone]; Fax: [phone]

## 2019-12-06 NOTE — Patient Instructions (Signed)
Medication Instructions:  START Toprol XL 12.5 mg daily  *If you need a refill on your cardiac medications before your next appointment, please call your pharmacy*   Lab Work: None today If you have labs (blood work) drawn today and your tests are completely normal, you will receive your results only by: [name] MyChart Message (if you have MyChart) OR . A paper copy in the mail If you have any lab test that is abnormal or we need to change your treatment, we will call you to review the results.   Testing/Procedures:You will have your yearly chest CT in October  Follow-Up: At [facility], you and your health needs are our priority.  As part of our continuing mission to provide you with exceptional heart care, we have created designated Provider Care Teams.  These Care Teams include your primary Cardiologist (physician) and Advanced Practice Providers (APPs -  Physician Assistants and Nurse Practitioners) who all work together to provide you with the care you need, when you need it.  We recommend signing up for the patient portal called "MyChart".  Sign up information is provided on this After Visit Summary.  MyChart is used to connect with patients for Virtual Visits (Telemedicine).  Patients are able to view lab/test results, encounter notes, upcoming appointments, etc.  Non-urgent messages can be sent to your provider as well.   To learn more about what you can do with MyChart, go to [email or web address].    Your next appointment:   7 month(s)  The format for your next appointment:   In Person  Provider:   Rozann Lesches, MD   Other Instructions We will call you closer to October to schedule your Chest CT      Thank you for choosing [facility] !

## 2020-02-16 ENCOUNTER — Encounter: Payer: Self-pay | Admitting: Internal Medicine

## 2020-02-16 ENCOUNTER — Ambulatory Visit (INDEPENDENT_AMBULATORY_CARE_PROVIDER_SITE_OTHER): Payer: Medicare Other | Admitting: Internal Medicine

## 2020-02-16 VITALS — BP 122/80 | HR 89 | Ht 63.0 in | Wt 208.1 lb

## 2020-02-16 DIAGNOSIS — Z9884 Bariatric surgery status: Secondary | ICD-10-CM | POA: Diagnosis not present

## 2020-02-16 DIAGNOSIS — K219 Gastro-esophageal reflux disease without esophagitis: Secondary | ICD-10-CM

## 2020-02-16 DIAGNOSIS — Z86010 Personal history of colonic polyps: Secondary | ICD-10-CM

## 2020-02-16 DIAGNOSIS — K449 Diaphragmatic hernia without obstruction or gangrene: Secondary | ICD-10-CM

## 2020-02-16 NOTE — Progress Notes (Signed)
Kristina [name] 49 y.o. Jan 30, 1971 [id number]  Assessment & Plan:   Encounter Diagnoses  Name Primary?  . Gastroesophageal reflux disease, unspecified whether esophagitis present Yes  . Hiatal hernia   . Bariatric surgery status - gastric sleeve     I think her problem is related to her gastric sleeve surgery and I suspect she will need a revision to a bypass procedure.  I will rescope her and assess the size of her hiatal hernia and examine her anatomy and mucosa otherwise.  She will then return to Kristina [name] for consideration of next steps.  I do not think medication will control this.  We could add some Carafate but all that is going to do is potentially bind up some fluid.  She has done a great job with additional weight loss but is still having problems.  She should continue low-carb/keto diet in my opinion.  CC: Kristina Squibb, MD Dr. Greer [name]  Subjective:   Chief Complaint: Reflux  HPI Kristina [name] is here for follow-up, at the request of Kristina [name], she has a history of a gastric sleeve and reflux problems, she has been continuing to have problems especially with nocturnal reflux and coughing and choking.  She has tried adding Pepcid in the morning she has been taking her Dexilant in the evenings.  She might be a little better this way.  She saw Kristina [name] and he is considering hiatal hernia repair, she had a small hiatal hernia seen on a CT scan of the chest recently (images reviewed) versus possible revision or conversion I mean to gastric bypass.  She has been doing keto and has lost over 30 pounds since starting that and said that that has helped her diarrhea that she was having tremendously.  She was last seen in 2019 at which point I did an EGD and a colonoscopy.  Random colon biopsies were negative and she was tried on alosetron but then switched diets and has been better with respect to diarrhea.  Brother had colon cancer she will be due for a repeat colonoscopy in 2024, 3  diminutive polyps removed a mix of sessile serrated polyps and adenomas.  2016 ssp and adenoma max 10 mm. The EGD demonstrated her sleeve Z-line at 35 cm, irregular but no pathology there.  She had a fundic gland polyp and reactive gastropathy and normal duodenal biopsies as part of her work-up for diarrhea.  I did not see a hiatal hernia.  Wt Readings from Last 3 Encounters:  02/16/20 208 lb 2 oz (94.4 kg)  12/06/19 223 lb (101.2 kg)  07/06/19 225 lb (102.1 kg)     Allergies  Allergen Reactions  . Ampicillin Anaphylaxis    Has patient had a PCN reaction causing immediate rash, facial/tongue/throat swelling, SOB or lightheadedness with hypotension: Yes Has patient had a PCN reaction causing severe rash involving mucus membranes or skin necrosis: Yes Has patient had a PCN reaction that required hospitalization Yes Has patient had a PCN reaction occurring within the last 10 years: No If all of the above answers are "NO", then may proceed with Cephalosporin use.   . Codeine Hives and Shortness Of Breath  . Sulfonamide Derivatives Itching and Rash   Current Meds  Medication Sig  . albuterol (VENTOLIN HFA) 108 (90 Base) MCG/ACT inhaler Inhale 1-2 puffs into the lungs every 6 (six) hours as needed for wheezing or shortness of breath.  [name] buPROPion (WELLBUTRIN SR) 200 MG 12 hr tablet Take 200 mg  by mouth 2 (two) times daily.  . cyanocobalamin (,VITAMIN B-12,) 1000 MCG/ML injection INJECT 1 ML (CC) ONCE A WEEK FOR 4 WEEKS THEN ONCE A MONTH  . dexlansoprazole (DEXILANT) 60 MG capsule Take 1 capsule (60 mg total) by mouth daily before breakfast.  . ergocalciferol (VITAMIN D2) 1.25 MG (50000 UT) capsule Vitamin D2 1,250 mcg (50,000 unit) capsule  TAKE 1 CAPSULE BY MOUTH ONCE A WEEK  . levothyroxine (SYNTHROID) 112 MCG tablet Take 112 mcg by mouth 2 (two) times daily.  . metoprolol succinate (TOPROL XL) 25 MG 24 hr tablet Take 0.5 tablets (12.5 mg total) by mouth daily.  [name] REPATHA SURECLICK [id number]  MG/ML SOAJ Inject 1 Syringe into the skin every 14 (fourteen) days.   Past Medical History:  Diagnosis Date  . Allergic rhinitis   . Anxiety   . Aortic dilatation (HCC)   . Asthma   . Cervical cancer ([place])   . Chronic back pain   . Chronic cough   . Depression   . Diverticulosis   . Essential hypertension   . GERD (gastroesophageal reflux disease)   . History of bronchitis   . History of pneumonia   . Hx of adenomatous and sessile serrated colonic polyps   . Hyperlipidemia   . Hypothyroidism   . Lumbosacral spondylosis without myelopathy    Bulging disc - chronic pain  . Obesity    Bariatric surgery  . Obstructive sleep apnea   . Restless leg   . Type 2 diabetes mellitus ([place])   . Vitamin D deficiency   . Vocal cord dysfunction    Past Surgical History:  Procedure Laterality Date  . ABDOMINAL HYSTERECTOMY  1996  . CHOLECYSTECTOMY  1994  . COLONOSCOPY WITH PROPOFOL N/A 03/10/2015   Procedure: COLONOSCOPY WITH PROPOFOL;  Surgeon: [name], MD;  Location: [facility];  Service: Endoscopy;  Laterality: N/A;  . ESOPHAGOGASTRODUODENOSCOPY N/A 10/27/2013   Procedure: ESOPHAGOGASTRODUODENOSCOPY (EGD);  Surgeon: [name], MD;  Location: [facility];  Service: Endoscopy;  Laterality: N/A;  . KNEE ARTHROSCOPY Right   . LAPAROSCOPIC GASTRIC SLEEVE RESECTION WITH HIATAL HERNIA REPAIR N/A 09/19/2015   Procedure: LAPAROSCOPIC GASTRIC SLEEVE RESECTION WITH HIATAL HERNIA REPAIR;  Surgeon: Kristina Pickerel, MD;  Location: [facility];  Service: General;  Laterality: N/A;  . LEFT HEART CATH AND CORONARY ANGIOGRAPHY N/A 05/17/2019   Procedure: LEFT HEART CATH AND CORONARY ANGIOGRAPHY;  Surgeon: Jettie Booze, MD;  Location: [facility];  Service: Cardiovascular;  Laterality: N/A;  . LIPOSUCTION N/A 11/26/2018   Procedure: LIPOSUCTION;  Surgeon: Wallace Going, DO;  Location: [facility];  Service: Plastics;  Laterality: N/A;  . NOSE SURGERY  2004  . ovaries  removed    . PANNICULECTOMY N/A 11/26/2018   Procedure: PANNICULECTOMY;  Surgeon: Wallace Going, DO;  Location: [facility];  Service: Plastics;  Laterality: N/A;  . TUBAL LIGATION    . UPPER GI ENDOSCOPY  09/19/2015   Procedure: UPPER GI ENDOSCOPY;  Surgeon: Kristina Pickerel, MD;  Location: [facility];  Service: General;;   Social History   Social History Narrative   Disabled   Prior enodoscopy technician [employer]   family history includes Allergies in her child and mother; Anxiety disorder in her mother; Asthma in her brother, brother, mother, son, and son; COPD in her brother and sister; Cancer in her maternal grandfather; Colon cancer (age of onset: 44) in her brother; Depression in her mother; Diabetes in her  mother; Emphysema in her mother; Heart attack in her brother and paternal grandfather; Heart disease in her father, maternal grandmother, mother, and paternal grandmother; High Cholesterol in her father and mother; High blood pressure in her father; Hypertension in her brother, daughter, and mother; Hyperthyroidism in her sister; Lung cancer in her maternal aunt; Multiple sclerosis in her paternal grandmother; Ovarian cancer in her mother; Stroke in her maternal grandmother and mother; Sudden death in her father and sister; Thyroid disease in her brother and mother.   Review of Systems As above  Objective:   Physical Exam BP 122/80 (BP Location: Left Arm, Patient Position: Sitting, Cuff Size: Large)   Pulse 89   Ht 5\' 3"  (1.6 m)   Wt 208 lb 2 oz (94.4 kg)   BMI 36.87 kg/m  No acute distress Lungs clear Normal heart sounds

## 2020-02-16 NOTE — Patient Instructions (Signed)
  You have been scheduled for an endoscopy. Please follow written instructions given to you at your visit today. If you use inhalers (even only as needed), please bring them with you on the day of your procedure.   I appreciate the opportunity to care for you. Carl Gessner, MD, FACG 

## 2020-02-17 ENCOUNTER — Telehealth: Payer: Self-pay | Admitting: Hematology and Oncology

## 2020-02-17 NOTE — Telephone Encounter (Signed)
Received a new hem referral from Dr. Redmond Pulling at [facility] for [facility]. Ms. Plymire has been cld and scheduled to see Dr. Lindi Adie on 6/8 at 230pm. Pt aware to arrive 15 minutes early.

## 2020-02-21 ENCOUNTER — Other Ambulatory Visit: Payer: Self-pay

## 2020-02-21 ENCOUNTER — Encounter: Payer: Self-pay | Admitting: Internal Medicine

## 2020-02-21 ENCOUNTER — Ambulatory Visit (AMBULATORY_SURGERY_CENTER): Payer: Medicare Other | Admitting: Internal Medicine

## 2020-02-21 VITALS — BP 132/82 | HR 72 | Temp 97.3°F | Resp 15 | Ht 63.0 in | Wt 208.0 lb

## 2020-02-21 DIAGNOSIS — K219 Gastro-esophageal reflux disease without esophagitis: Secondary | ICD-10-CM

## 2020-02-21 DIAGNOSIS — K449 Diaphragmatic hernia without obstruction or gangrene: Secondary | ICD-10-CM

## 2020-02-21 MED ORDER — SODIUM CHLORIDE 0.9 % IV SOLN: 500.0000 mL | Freq: Once | INTRAVENOUS | Status: DC

## 2020-02-21 NOTE — Op Note (Signed)
[facility] Patient Name: Kristina [name] Procedure Date: 02/21/2020 3:44 PM MRN: [id number] Endoscopist: [name] , MD Age: 49 Referring MD:  Date of Birth: December 11, 1970 Gender: Female Account #: [id number] Procedure:                Upper GI endoscopy Indications:              Esophageal reflux symptoms that persist despite                            appropriate therapy Medicines:                Propofol per Anesthesia, Monitored Anesthesia Care Procedure:                Pre-Anesthesia Assessment:                           - Prior to the procedure, a History and Physical                            was performed, and patient medications and                            allergies were reviewed. The patient's tolerance of                            previous anesthesia was also reviewed. The risks                            and benefits of the procedure and the sedation                            options and risks were discussed with the patient.                            All questions were answered, and informed consent                            was obtained. Prior Anticoagulants: The patient has                            taken no previous anticoagulant or antiplatelet                            agents. ASA Grade Assessment: III - A patient with                            severe systemic disease. After reviewing the risks                            and benefits, the patient was deemed in                            satisfactory condition to undergo the procedure.  After obtaining informed consent, the endoscope was                            passed under direct vision. Throughout the                            procedure, the patient's blood pressure, pulse, and                            oxygen saturations were monitored continuously. The                            Endoscope was introduced through the mouth, and                            advanced  to the second part of duodenum. The upper                            GI endoscopy was accomplished without difficulty.                            The patient tolerated the procedure well. Scope In: Scope Out: Findings:                 The examined esophagus was normal.                           A 5 cm sliding hiatal hernia was found. The hiatal                            narrowing was 40 cm from the incisors. The Z-line                            was 35 cm from the incisors.                           Evidence of a sleeve gastrectomy was found in the                            stomach.                           The examined duodenum was normal.                           Normal gastric retroflexion - post-op state. Complications:            No immediate complications. Estimated Blood Loss:     Estimated blood loss: none. Impression:               - Normal esophagus.                           - 5 cm sliding hiatal hernia.                           -  A sleeve gastrectomy was found.                           - Normal examined duodenum.                           - No specimens collected. Recommendation:           - Patient has a contact number available for                            emergencies. The signs and symptoms of potential                            delayed complications were discussed with the                            patient. Return to normal activities tomorrow.                            Written discharge instructions were provided to the                            patient.                           - Resume previous diet.                           - Continue present medications.                           - Further plans per Dr. Ileana Ladd, MD 02/21/2020 4:12:06 PM This report has been signed electronically.

## 2020-02-21 NOTE — Patient Instructions (Addendum)
There is a sliding hiatal hernia 2-5 cm in length. Mucosa looks ok everywhere - no inflammation.  I appreciate the opportunity to care for you. [name], MD, [facility]  Handouts given:  Hiatal Hernia  Resume previous diet  Continue present medications  YOU HAD AN ENDOSCOPIC PROCEDURE TODAY AT [facility]:   Refer to the procedure report that was given to you for any specific questions about what was found during the examination.  If the procedure report does not answer your questions, please call your gastroenterologist to clarify.  If you requested that your care partner not be given the details of your procedure findings, then the procedure report has been included in a sealed envelope for you to review at your convenience later.  YOU SHOULD EXPECT: Some feelings of bloating in the abdomen. Passage of more gas than usual.  Walking can help get rid of the air that was put into your GI tract during the procedure and reduce the bloating. If you had a lower endoscopy (such as a colonoscopy or flexible sigmoidoscopy) you may notice spotting of blood in your stool or on the toilet paper. If you underwent a bowel prep for your procedure, you may not have a normal bowel movement for a few days.  Please Note:  You might notice some irritation and congestion in your nose or some drainage.  This is from the oxygen used during your procedure.  There is no need for concern and it should clear up in a day or so.  SYMPTOMS TO REPORT IMMEDIATELY:     Following upper endoscopy (EGD)  Vomiting of blood or coffee ground material  New chest pain or pain under the shoulder blades  Painful or persistently difficult swallowing  New shortness of breath  Fever of 100F or higher  Black, tarry-looking stools  For urgent or emergent issues, a gastroenterologist can be reached at any hour by calling [phone]. Do not use MyChart messaging for urgent concerns.    DIET:  We do recommend a  small meal at first, but then you may proceed to your regular diet.  Drink plenty of fluids but you should avoid alcoholic beverages for 24 hours.  ACTIVITY:  You should plan to take it easy for the rest of today and you should NOT DRIVE or use heavy machinery until tomorrow (because of the sedation medicines used during the test).    FOLLOW UP: Our staff will call the number listed on your records 48-72 hours following your procedure to check on you and address any questions or concerns that you may have regarding the information given to you following your procedure. If we do not reach you, we will leave a message.  We will attempt to reach you two times.  During this call, we will ask if you have developed any symptoms of COVID 19. If you develop any symptoms (ie: fever, flu-like symptoms, shortness of breath, cough etc.) before then, please call [phone].  If you test positive for Covid 19 in the 2 weeks post procedure, please call and report this information to [place].    If any biopsies were taken you will be contacted by phone or by letter within the next 1-3 weeks.  Please call us at [phone] if you have not heard about the biopsies in 3 weeks.    SIGNATURES/CONFIDENTIALITY: You and/or your care partner have signed paperwork which will be entered into your electronic medical record.  These signatures attest to  the fact that that the information above on your After Visit Summary has been reviewed and is understood.  Full responsibility of the confidentiality of this discharge information lies with you and/or your care-partner.

## 2020-02-21 NOTE — Progress Notes (Signed)
A/ox3, pleased with MAC, report to RN 

## 2020-02-21 NOTE — Progress Notes (Signed)
Pt's states no medical or surgical changes since previsit or office visit. 

## 2020-02-21 NOTE — Progress Notes (Signed)
[facility] CONSULT NOTE  Patient Care Team: Celene Squibb, MD as PCP - General (Internal Medicine) Satira Sark, MD as PCP - Cardiology (Cardiology)  CHIEF COMPLAINTS/PURPOSE OF CONSULTATION:  Newly diagnosed iron deficiency anemia  HISTORY OF PRESENTING ILLNESS:  Kristina [name] 49 y.o. female is here because of recent diagnosis of iron deficiency anemia. She is referred by Dr. Redmond Pulling at [facility]. Labs on 01/13/20 showed B-12 >2000, total iron 37, ferritin 9, folate 10.1. She presents to the clinic today for initial evaluation.   I reviewed her records extensively and collaborated the history with the patient.  MEDICAL HISTORY:  Past Medical History:  Diagnosis Date  . Allergic rhinitis   . Anxiety   . Aortic dilatation (HCC)   . Asthma   . Cervical cancer ([place])   . Chronic back pain   . Chronic cough   . Depression   . Diverticulosis   . Essential hypertension   . GERD (gastroesophageal reflux disease)   . History of bronchitis   . History of pneumonia   . Hx of adenomatous and sessile serrated colonic polyps   . Hyperlipidemia   . Hypothyroidism   . Lumbosacral spondylosis without myelopathy    Bulging disc - chronic pain  . Obesity    Bariatric surgery  . Obstructive sleep apnea   . Restless leg   . Type 2 diabetes mellitus ([place])   . Vitamin D deficiency   . Vocal cord dysfunction     SURGICAL HISTORY: Past Surgical History:  Procedure Laterality Date  . ABDOMINAL HYSTERECTOMY  1996  . CHOLECYSTECTOMY  1994  . COLONOSCOPY WITH PROPOFOL N/A 03/10/2015   Procedure: COLONOSCOPY WITH PROPOFOL;  Surgeon: [name], MD;  Location: [facility];  Service: Endoscopy;  Laterality: N/A;  . ESOPHAGOGASTRODUODENOSCOPY N/A 10/27/2013   Procedure: ESOPHAGOGASTRODUODENOSCOPY (EGD);  Surgeon: [name], MD;  Location: [facility];  Service: Endoscopy;  Laterality: N/A;  . KNEE ARTHROSCOPY Right   . LAPAROSCOPIC GASTRIC SLEEVE RESECTION WITH HIATAL  HERNIA REPAIR N/A 09/19/2015   Procedure: LAPAROSCOPIC GASTRIC SLEEVE RESECTION WITH HIATAL HERNIA REPAIR;  Surgeon: [name], MD;  Location: [facility];  Service: General;  Laterality: N/A;  . LEFT HEART CATH AND CORONARY ANGIOGRAPHY N/A 05/17/2019   Procedure: LEFT HEART CATH AND CORONARY ANGIOGRAPHY;  Surgeon: Jettie Booze, MD;  Location: [facility];  Service: Cardiovascular;  Laterality: N/A;  . LIPOSUCTION N/A 11/26/2018   Procedure: LIPOSUCTION;  Surgeon: Wallace Going, DO;  Location: [facility];  Service: Plastics;  Laterality: N/A;  . NOSE SURGERY  2004  . ovaries removed    . PANNICULECTOMY N/A 11/26/2018   Procedure: PANNICULECTOMY;  Surgeon: Wallace Going, DO;  Location: [facility];  Service: Plastics;  Laterality: N/A;  . TUBAL LIGATION    . UPPER GI ENDOSCOPY  09/19/2015   Procedure: UPPER GI ENDOSCOPY;  Surgeon: [name], MD;  Location: [facility];  Service: General;;    SOCIAL HISTORY: Social History   Socioeconomic History  . Marital status: Divorced    Spouse name: Not on file  . Number of children: 3  . Years of education: college  . Highest education level: Not on file  Occupational History  . Occupation: disabled    Fish farm manager: [employer]  Tobacco Use  . Smoking status: Never Smoker  . Smokeless tobacco: Never Used  Substance and Sexual Activity  . Alcohol use: No  . Drug use: No  .  Sexual activity: Yes    Birth control/protection: Surgical  Other Topics Concern  . Not on file  Social History Narrative   Disabled   Prior Conservator, museum/gallery and [employer]   Social Determinants of Health   Financial Resource Strain:   . Difficulty of Paying Living Expenses:   Food Insecurity:   . Worried About [name] fundraiser in the Last Year:   . Arboriculturist in the Last Year:   Transportation Needs:   . Film/video editor (Medical):   [name] Lack of Transportation (Non-Medical):   Physical Activity:   .  Days of Exercise per Week:   . Minutes of Exercise per Session:   Stress:   . Feeling of Stress :   Social Connections:   . Frequency of Communication with Friends and Family:   . Frequency of Social Gatherings with Friends and Family:   . Attends Religious Services:   . Active Member of Clubs or Organizations:   . Attends Archivist Meetings:   [name] Marital Status:   Intimate Partner Violence:   . Fear of Current or Ex-Partner:   . Emotionally Abused:   [name] Physically Abused:   . Sexually Abused:     FAMILY HISTORY: Family History  Problem Relation Age of Onset  . Emphysema Mother   . Allergies Mother   . Heart disease Mother   . Asthma Mother   . Ovarian cancer Mother   . Hypertension Mother   . Diabetes Mother   . High Cholesterol Mother   . Stroke Mother   . Thyroid disease Mother   . Depression Mother   . Anxiety disorder Mother   . Heart disease Father        deceased at age 52 from heart attack  . Sudden death Father   . High Cholesterol Father   . High blood pressure Father   . Stroke Maternal Grandmother   . Heart disease Maternal Grandmother   . Cancer Maternal Grandfather   . Heart disease Paternal Grandmother   . Multiple sclerosis Paternal Grandmother   . Heart attack Paternal Grandfather   . Thyroid disease Brother        died at 18  . Heart attack Brother        died at 61  . COPD Brother   . Colon cancer Brother 17  . Hyperthyroidism Sister   . Hypertension Brother   . Hypertension Daughter   . Allergies Child   . Asthma Son   . Asthma Brother   . Asthma Brother   . Lung cancer Maternal Aunt   . COPD Sister        died at 67  . Sudden death Sister   . Asthma Son     ALLERGIES:  is allergic to ampicillin; codeine; and sulfonamide derivatives.  MEDICATIONS:  Current Outpatient Medications  Medication Sig Dispense Refill  . albuterol (VENTOLIN HFA) 108 (90 Base) MCG/ACT inhaler Inhale 1-2 puffs into the lungs every 6 (six) hours  as needed for wheezing or shortness of breath.    [name] buPROPion (WELLBUTRIN SR) 200 MG 12 hr tablet Take 200 mg by mouth 2 (two) times daily.    . cyanocobalamin (,VITAMIN B-12,) 1000 MCG/ML injection INJECT 1 ML (CC) ONCE A WEEK FOR 4 WEEKS THEN ONCE A MONTH    . dexlansoprazole (DEXILANT) 60 MG capsule Take 1 capsule (60 mg total) by mouth daily before breakfast. 90 capsule 3  . ergocalciferol (VITAMIN  D2) 1.25 MG (50000 UT) capsule Vitamin D2 1,250 mcg (50,000 unit) capsule  TAKE 1 CAPSULE BY MOUTH ONCE A WEEK    . levothyroxine (SYNTHROID) 112 MCG tablet Take 112 mcg by mouth 2 (two) times daily.    . metoprolol succinate (TOPROL XL) 25 MG 24 hr tablet Take 0.5 tablets (12.5 mg total) by mouth daily. (Patient not taking: Reported on 02/21/2020) 45 tablet 3  . REPATHA SURECLICK 924 MG/ML SOAJ Inject 1 Syringe into the skin every 14 (fourteen) days.     No current facility-administered medications for this visit.    REVIEW OF SYSTEMS:   Constitutional: Denies fevers, chills or abnormal night sweats Eyes: Denies blurriness of vision, double vision or watery eyes Ears, nose, mouth, throat, and face: Denies mucositis or sore throat Respiratory: Denies cough, dyspnea or wheezes Cardiovascular: Denies palpitation, chest discomfort or lower extremity swelling Gastrointestinal:  Denies nausea, heartburn or change in bowel habits Skin: Denies abnormal skin rashes Lymphatics: Denies new lymphadenopathy or easy bruising Neurological:Denies numbness, tingling or new weaknesses Behavioral/Psych: Mood is stable, no new changes  Breast: Denies any palpable lumps or discharge All other systems were reviewed with the patient and are negative.  PHYSICAL EXAMINATION: ECOG PERFORMANCE STATUS: 1 - Symptomatic but completely ambulatory  Vitals:   02/22/20 1427  BP: 125/82  Pulse: 78  Resp: 18  Temp: 98.7 F (37.1 C)  SpO2: 100%   Filed Weights   02/22/20 1427  Weight: 203 lb 12.8 oz (92.4 kg)     GENERAL:alert, no distress and comfortable SKIN: skin color, texture, turgor are normal, no rashes or significant lesions EYES: normal, conjunctiva are pink and non-injected, sclera clear OROPHARYNX:no exudate, no erythema and lips, buccal mucosa, and tongue normal  NECK: supple, thyroid normal size, non-tender, without nodularity LYMPH:  no palpable lymphadenopathy in the cervical, axillary or inguinal LUNGS: clear to auscultation and percussion with normal breathing effort HEART: regular rate & rhythm and no murmurs and no lower extremity edema ABDOMEN:abdomen soft, non-tender and normal bowel sounds Musculoskeletal:no cyanosis of digits and no clubbing  PSYCH: alert & oriented x 3 with fluent speech NEURO: no focal motor/sensory deficits  LABORATORY DATA:  I have reviewed the data as listed Lab Results  Component Value Date   WBC 5.9 05/17/2019   HGB 11.5 (L) 05/17/2019   HCT 38.5 05/17/2019   MCV 81.1 05/17/2019   PLT 194 05/17/2019   Lab Results  Component Value Date   NA 141 05/17/2019   K 3.7 05/17/2019   CL 110 05/17/2019   CO2 23 05/17/2019    RADIOGRAPHIC STUDIES: I have personally reviewed the radiological reports and agreed with the findings in the report.  ASSESSMENT AND PLAN:  Iron deficiency anemia Lab review: 01/28/2020: Ferritin 9, serum iron 37 B12 greater than 2683, folic acid 41.9 Status post bariatric surgery (laparoscopic sleeve gastrectomy with hiatal hernia repair 09/19/2015)  Iron deficiency anemia: I discussed with the patient the process of iron absorption. I counseled extensively regarding the different causes of iron deficiency including blood loss and malabsorption.  Recommendation: proceed with 2 doses of IV iron infusion with Feraheme  I discussed with the patient that potentially there may be a need for additional IV iron infusions if the iron levels were to remain low in the future. The frequency of need of IV iron would depend on many  other factors including the rate of loss and by the degree of absorption.  Return to clinic in 3 months with recheck  on iron studies and hemoglobin.     All questions were answered. The patient knows to call the clinic with any problems, questions or concerns.   Rulon Eisenmenger, MD, MPH 02/22/2020    I, Molly Dorshimer, am acting as scribe for Nicholas Lose, MD.  I have reviewed the above documentation for accuracy and completeness, and I agree with the above.

## 2020-02-22 ENCOUNTER — Inpatient Hospital Stay: Payer: Medicare Other | Attending: Hematology and Oncology | Admitting: Hematology and Oncology

## 2020-02-22 ENCOUNTER — Other Ambulatory Visit: Payer: Self-pay

## 2020-02-22 DIAGNOSIS — Z79899 Other long term (current) drug therapy: Secondary | ICD-10-CM | POA: Insufficient documentation

## 2020-02-22 DIAGNOSIS — Z86010 Personal history of colonic polyps: Secondary | ICD-10-CM | POA: Diagnosis not present

## 2020-02-22 DIAGNOSIS — G2581 Restless legs syndrome: Secondary | ICD-10-CM | POA: Diagnosis not present

## 2020-02-22 DIAGNOSIS — E119 Type 2 diabetes mellitus without complications: Secondary | ICD-10-CM | POA: Diagnosis not present

## 2020-02-22 DIAGNOSIS — F329 Major depressive disorder, single episode, unspecified: Secondary | ICD-10-CM | POA: Diagnosis not present

## 2020-02-22 DIAGNOSIS — J45909 Unspecified asthma, uncomplicated: Secondary | ICD-10-CM | POA: Insufficient documentation

## 2020-02-22 DIAGNOSIS — K219 Gastro-esophageal reflux disease without esophagitis: Secondary | ICD-10-CM | POA: Diagnosis not present

## 2020-02-22 DIAGNOSIS — I10 Essential (primary) hypertension: Secondary | ICD-10-CM | POA: Diagnosis not present

## 2020-02-22 DIAGNOSIS — F419 Anxiety disorder, unspecified: Secondary | ICD-10-CM | POA: Insufficient documentation

## 2020-02-22 DIAGNOSIS — G4733 Obstructive sleep apnea (adult) (pediatric): Secondary | ICD-10-CM | POA: Diagnosis not present

## 2020-02-22 DIAGNOSIS — E039 Hypothyroidism, unspecified: Secondary | ICD-10-CM | POA: Diagnosis not present

## 2020-02-22 DIAGNOSIS — Z8541 Personal history of malignant neoplasm of cervix uteri: Secondary | ICD-10-CM | POA: Diagnosis not present

## 2020-02-22 DIAGNOSIS — E669 Obesity, unspecified: Secondary | ICD-10-CM | POA: Diagnosis not present

## 2020-02-22 DIAGNOSIS — E785 Hyperlipidemia, unspecified: Secondary | ICD-10-CM | POA: Diagnosis not present

## 2020-02-22 DIAGNOSIS — D509 Iron deficiency anemia, unspecified: Secondary | ICD-10-CM | POA: Diagnosis present

## 2020-02-22 NOTE — Assessment & Plan Note (Signed)
Lab review: 01/28/2020: Ferritin 9, serum iron 37 B12 greater than 5872, folic acid 76.1 Status post bariatric surgery (laparoscopic sleeve gastrectomy with hiatal hernia repair 09/19/2015)  Iron deficiency anemia: I discussed with the patient the process of iron absorption. I counseled extensively regarding the different causes of iron deficiency including blood loss and malabsorption.  Recommendation: proceed with 2 doses of IV iron infusion with Feraheme  I discussed with the patient that potentially there may be a need for additional IV iron infusions if the iron levels were to remain low in the future. The frequency of need of IV iron would depend on many other factors including the rate of loss and by the degree of absorption.  Return to clinic in 3 months with recheck on iron studies and hemoglobin.

## 2020-02-23 ENCOUNTER — Telehealth: Payer: Self-pay

## 2020-02-23 NOTE — Telephone Encounter (Signed)
°  Follow up Call-  Call back number 02/21/2020 03/31/2018  Post procedure Call Back phone  # [phone]  Permission to leave phone message Yes Yes  Some recent data might be hidden     Patient questions:  Do you have a fever, pain , or abdominal swelling? No. Pain Score  0 *  Have you tolerated food without any problems? Yes.    Have you been able to return to your normal activities? Yes.    Do you have any questions about your discharge instructions: Diet   No. Medications  No. Follow up visit  No.  Do you have questions or concerns about your Care? No.  Actions: * If pain score is 4 or above: No action needed, pain <4. 1. Have you developed a fever since your procedure? no  2.   Have you had an respiratory symptoms (SOB or cough) since your procedure? no  3.   Have you tested positive for COVID 19 since your procedure no  4.   Have you had any family members/close contacts diagnosed with the COVID 19 since your procedure?  no   If yes to any of these questions please route to Joylene John, RN and Erenest Rasher, RN

## 2020-03-03 ENCOUNTER — Other Ambulatory Visit: Payer: Self-pay

## 2020-03-03 ENCOUNTER — Inpatient Hospital Stay: Payer: Medicare Other | Attending: Hematology and Oncology

## 2020-03-03 VITALS — BP 112/78 | HR 65 | Temp 97.8°F | Resp 16

## 2020-03-03 DIAGNOSIS — Z79899 Other long term (current) drug therapy: Secondary | ICD-10-CM | POA: Diagnosis not present

## 2020-03-03 DIAGNOSIS — D509 Iron deficiency anemia, unspecified: Secondary | ICD-10-CM | POA: Diagnosis present

## 2020-03-03 MED ORDER — SODIUM CHLORIDE 0.9 % IV SOLN
Freq: Once | INTRAVENOUS | Status: AC
  Filled 2020-03-03: qty 250

## 2020-03-03 MED ORDER — ACETAMINOPHEN 325 MG PO TABS
650.0000 mg | ORAL_TABLET | Freq: Once | ORAL | Status: AC
  Administered 2020-03-03: 650 mg via ORAL

## 2020-03-03 MED ORDER — SODIUM CHLORIDE 0.9 % IV SOLN
510.0000 mg | Freq: Once | INTRAVENOUS | Status: AC
  Administered 2020-03-03: 510 mg via INTRAVENOUS
  Filled 2020-03-03: qty 17

## 2020-03-03 MED ORDER — ACETAMINOPHEN 325 MG PO TABS
ORAL_TABLET | ORAL | Status: AC
  Filled 2020-03-03: qty 2

## 2020-03-03 NOTE — Patient Instructions (Addendum)

## 2020-03-10 ENCOUNTER — Other Ambulatory Visit: Payer: Self-pay

## 2020-03-10 ENCOUNTER — Inpatient Hospital Stay: Payer: Medicare Other

## 2020-03-10 VITALS — BP 102/77 | HR 64 | Temp 97.7°F | Resp 16

## 2020-03-10 DIAGNOSIS — D509 Iron deficiency anemia, unspecified: Secondary | ICD-10-CM | POA: Diagnosis not present

## 2020-03-10 MED ORDER — SODIUM CHLORIDE 0.9 % IV SOLN
510.0000 mg | Freq: Once | INTRAVENOUS | Status: AC
  Administered 2020-03-10: 510 mg via INTRAVENOUS
  Filled 2020-03-10: qty 510

## 2020-03-10 MED ORDER — ACETAMINOPHEN 325 MG PO TABS
650.0000 mg | ORAL_TABLET | Freq: Once | ORAL | Status: AC
  Administered 2020-03-10: 650 mg via ORAL

## 2020-03-10 MED ORDER — SODIUM CHLORIDE 0.9 % IV SOLN
Freq: Once | INTRAVENOUS | Status: AC
  Filled 2020-03-10: qty 250

## 2020-03-10 MED ORDER — ACETAMINOPHEN 325 MG PO TABS
ORAL_TABLET | ORAL | Status: AC
  Filled 2020-03-10: qty 2

## 2020-03-10 NOTE — Patient Instructions (Signed)

## 2020-04-10 ENCOUNTER — Other Ambulatory Visit: Payer: Self-pay | Admitting: General Surgery

## 2020-04-10 DIAGNOSIS — K219 Gastro-esophageal reflux disease without esophagitis: Secondary | ICD-10-CM

## 2020-04-12 ENCOUNTER — Ambulatory Visit
Admission: RE | Admit: 2020-04-12 | Discharge: 2020-04-12 | Disposition: A | Discharge status: Home / Self Care | Payer: Medicare Other | Source: Ambulatory Visit | Attending: General Surgery | Admitting: General Surgery

## 2020-04-12 ENCOUNTER — Other Ambulatory Visit: Payer: Self-pay | Admitting: General Surgery

## 2020-04-12 DIAGNOSIS — K219 Gastro-esophageal reflux disease without esophagitis: Secondary | ICD-10-CM

## 2020-05-23 ENCOUNTER — Other Ambulatory Visit: Payer: Self-pay

## 2020-05-23 DIAGNOSIS — D509 Iron deficiency anemia, unspecified: Secondary | ICD-10-CM

## 2020-05-24 ENCOUNTER — Other Ambulatory Visit: Payer: Self-pay

## 2020-05-24 ENCOUNTER — Encounter: Payer: Self-pay | Admitting: Hematology and Oncology

## 2020-05-24 ENCOUNTER — Inpatient Hospital Stay: Payer: Medicare Other | Attending: Hematology and Oncology

## 2020-05-24 DIAGNOSIS — Z79899 Other long term (current) drug therapy: Secondary | ICD-10-CM | POA: Insufficient documentation

## 2020-05-24 DIAGNOSIS — D509 Iron deficiency anemia, unspecified: Secondary | ICD-10-CM

## 2020-05-24 LAB — CBC WITH DIFFERENTIAL (CANCER CENTER ONLY)
Abs Immature Granulocytes: 0.02 10*3/uL (ref 0.00–0.07)
Basophils Absolute: 0 10*3/uL (ref 0.0–0.1)
Basophils Relative: 1 %
Eosinophils Absolute: 0.1 10*3/uL (ref 0.0–0.5)
Eosinophils Relative: 2 %
HCT: 40.6 % (ref 36.0–46.0)
Hemoglobin: 13.6 g/dL (ref 12.0–15.0)
Immature Granulocytes: 0 %
Lymphocytes Relative: 38 %
Lymphs Abs: 2.4 10*3/uL (ref 0.7–4.0)
MCH: 29.2 pg (ref 26.0–34.0)
MCHC: 33.5 g/dL (ref 30.0–36.0)
MCV: 87.3 fL (ref 80.0–100.0)
Monocytes Absolute: 0.5 10*3/uL (ref 0.1–1.0)
Monocytes Relative: 8 %
Neutro Abs: 3.2 10*3/uL (ref 1.7–7.7)
Neutrophils Relative %: 51 %
Platelet Count: 161 10*3/uL (ref 150–400)
RBC: 4.65 MIL/uL (ref 3.87–5.11)
RDW: 14 % (ref 11.5–15.5)
WBC Count: 6.3 10*3/uL (ref 4.0–10.5)
nRBC: 0 % (ref 0.0–0.2)

## 2020-05-24 LAB — IRON AND TIBC
Iron: 129 ug/dL (ref 41–142)
Saturation Ratios: 36 % (ref 21–57)
TIBC: 354 ug/dL (ref 236–444)
UIBC: 225 ug/dL (ref 120–384)

## 2020-05-24 LAB — FERRITIN: Ferritin: 161 ng/mL (ref 11–307)

## 2020-05-25 ENCOUNTER — Inpatient Hospital Stay: Payer: Medicare Other | Admitting: Hematology and Oncology

## 2020-05-25 NOTE — Assessment & Plan Note (Deleted)
Lab review: 01/28/2020: Ferritin 9, serum iron 37 B12 greater than 8338, folic acid 25.0 Status post bariatric surgery (laparoscopic sleeve gastrectomy with hiatal hernia repair 09/19/2015)  IV Iron: June 2021 Lab review: 05/24/20: Iron saturation: 36%, Hb 13.6, Ferritin 161 Excellent response to IV Iron.  RTC in 3 months with labs done ahead of time

## 2020-05-29 NOTE — Progress Notes (Signed)
Patient Care Team: Celene Squibb, MD as PCP - General (Internal Medicine) Satira Sark, MD as PCP - Cardiology (Cardiology)  DIAGNOSIS:    ICD-10-CM   1. Iron deficiency anemia, unspecified iron deficiency anemia type  D50.9     CHIEF COMPLIANT: Follow-up of iron deficiency anemia  INTERVAL HISTORY: Kristina [name] is a 49 y.o. with above-mentioned history of iron deficiency anemia. Labs on 05/24/20 showed Hg 13.6, HCT 40.6, platelets 161, iron saturation 36%, ferritin 161. She presents to the clinic today for follow-up.    She had significant improvement in her energy levels after that infusion but they are starting to come back down. She no longer has the cravings for ice chips.  ALLERGIES:  is allergic to ampicillin, codeine, and sulfonamide derivatives.  MEDICATIONS:  Current Outpatient Medications  Medication Sig Dispense Refill   albuterol (VENTOLIN HFA) 108 (90 Base) MCG/ACT inhaler Inhale 1-2 puffs into the lungs every 6 (six) hours as needed for wheezing or shortness of breath.     buPROPion (WELLBUTRIN SR) 200 MG 12 hr tablet Take 200 mg by mouth 2 (two) times daily.     cyanocobalamin (,VITAMIN B-12,) 1000 MCG/ML injection INJECT 1 ML (CC) ONCE A WEEK FOR 4 WEEKS THEN ONCE A MONTH     dexlansoprazole (DEXILANT) 60 MG capsule Take 1 capsule (60 mg total) by mouth daily before breakfast. 90 capsule 3   ergocalciferol (VITAMIN D2) 1.25 MG (50000 UT) capsule Vitamin D2 1,250 mcg (50,000 unit) capsule  TAKE 1 CAPSULE BY MOUTH ONCE A WEEK     levothyroxine (SYNTHROID) 112 MCG tablet Take 112 mcg by mouth 2 (two) times daily.     metoprolol succinate (TOPROL XL) 25 MG 24 hr tablet Take 0.5 tablets (12.5 mg total) by mouth daily. (Patient not taking: Reported on 02/21/2020) 45 tablet 3   REPATHA SURECLICK 546 MG/ML SOAJ Inject 1 Syringe into the skin every 14 (fourteen) days.     No current facility-administered medications for this visit.    PHYSICAL  EXAMINATION: ECOG PERFORMANCE STATUS: 1 - Symptomatic but completely ambulatory  There were no vitals filed for this visit. There were no vitals filed for this visit.  LABORATORY DATA:  I have reviewed the data as listed CMP Latest Ref Rng & Units 05/17/2019 05/16/2019 12/29/2018  Glucose 70 - 99 mg/dL 87 128(H) 90  BUN 6 - 20 mg/dL 12 11 11   Creatinine 0.44 - 1.00 mg/dL 0.58 0.66 0.62  Sodium 135 - 145 mmol/L 141 141 142  Potassium 3.5 - 5.1 mmol/L 3.7 3.9 4.2  Chloride 98 - 111 mmol/L 110 111 102  CO2 22 - 32 mmol/L 23 21(L) 23  Calcium 8.9 - 10.3 mg/dL 9.1 9.3 9.4  Total Protein 6.5 - 8.1 g/dL 6.7 - 5.9(L)  Total Bilirubin 0.3 - 1.2 mg/dL 0.2(L) - 0.3  Alkaline Phos 38 - 126 U/L 102 - 113  AST 15 - 41 U/L 13(L) - 11  ALT 0 - 44 U/L 13 - 11    Lab Results  Component Value Date   WBC 6.3 05/24/2020   HGB 13.6 05/24/2020   HCT 40.6 05/24/2020   MCV 87.3 05/24/2020   PLT 161 05/24/2020   NEUTROABS 3.2 05/24/2020    ASSESSMENT & PLAN:  Iron deficiency anemia Lab review: 01/28/2020: Ferritin 9, serum iron 37 B12 greater than 2703, folic acid 50.0 Status post bariatric surgery (laparoscopic sleeve gastrectomy with hiatal hernia repair 09/19/2015)  IV iron: June 2021  Lab  review: 05/24/2020: Hemoglobin 13.6, MCV 87.3, TIBC 354, iron saturation 36%, ferritin 161 (was at 9) I discussed excellent results from the iron infusion. She had an immediate improvement in her energy levels but it has plateaued up and it is starting to come back down.  Recheck labs in 3 months and follow-up after that with MyChart virtual visit.    No orders of the defined types were placed in this encounter.  The patient has a good understanding of the overall plan. she agrees with it. she will call with any problems that may develop before the next visit here.  Total time spent: 20 mins including face to face time and time spent for planning, charting and coordination of care  Nicholas Lose,  MD 05/30/2020  I, Cloyde Reams Dorshimer, am acting as scribe for Dr. Nicholas Lose.  I have reviewed the above documentation for accuracy and completeness, and I agree with the above.

## 2020-05-30 ENCOUNTER — Other Ambulatory Visit: Payer: Self-pay

## 2020-05-30 ENCOUNTER — Inpatient Hospital Stay (HOSPITAL_BASED_OUTPATIENT_CLINIC_OR_DEPARTMENT_OTHER): Payer: Medicare Other | Admitting: Hematology and Oncology

## 2020-05-30 DIAGNOSIS — D509 Iron deficiency anemia, unspecified: Secondary | ICD-10-CM

## 2020-05-30 DIAGNOSIS — Z79899 Other long term (current) drug therapy: Secondary | ICD-10-CM | POA: Diagnosis not present

## 2020-05-30 NOTE — Assessment & Plan Note (Signed)
Lab review: 01/28/2020: Ferritin 9, serum iron 37 B12 greater than 7573, folic acid 22.5 Status post bariatric surgery (laparoscopic sleeve gastrectomy with hiatal hernia repair 09/19/2015)  IV iron: June 2021  Lab review: 05/24/2020: Hemoglobin 13.6, MCV 87.3, TIBC 354, iron saturation 36%, ferritin 161 (was at 9) I discussed excellent results from the iron infusion.  Recheck labs in 3 months and follow-up after that.

## 2020-06-19 ENCOUNTER — Telehealth: Payer: Self-pay

## 2020-06-19 DIAGNOSIS — I77810 Thoracic aortic ectasia: Secondary | ICD-10-CM

## 2020-06-19 NOTE — Addendum Note (Signed)
Addended by: Barbarann Ehlers A on: 06/19/2020 01:15 PM   Modules accepted: Orders

## 2020-06-19 NOTE — Telephone Encounter (Addendum)
Order placed for yearly chest CT aortic root surveillance.   [place] office to schedule   Patient is aware.   BMET placed

## 2020-06-26 DIAGNOSIS — B308 Other viral conjunctivitis: Secondary | ICD-10-CM | POA: Diagnosis not present

## 2020-07-05 ENCOUNTER — Other Ambulatory Visit (HOSPITAL_COMMUNITY)
Admission: RE | Admit: 2020-07-05 | Discharge: 2020-07-05 | Disposition: A | Discharge status: Home / Self Care | Payer: Medicare Other | Source: Ambulatory Visit | Attending: Cardiology | Admitting: Cardiology

## 2020-07-05 ENCOUNTER — Other Ambulatory Visit: Payer: Self-pay

## 2020-07-05 DIAGNOSIS — I77810 Thoracic aortic ectasia: Secondary | ICD-10-CM | POA: Insufficient documentation

## 2020-07-05 LAB — BASIC METABOLIC PANEL
Anion gap: 8 (ref 5–15)
BUN: 11 mg/dL (ref 6–20)
CO2: 25 mmol/L (ref 22–32)
Calcium: 9.1 mg/dL (ref 8.9–10.3)
Chloride: 105 mmol/L (ref 98–111)
Creatinine, Ser: 0.75 mg/dL (ref 0.44–1.00)
GFR, Estimated: >60 mL/min (ref >60)
Glucose, Bld: 120 mg/dL — ABNORMAL HIGH (ref 70–99)
Potassium: 3.7 mmol/L (ref 3.5–5.1)
Sodium: 138 mmol/L (ref 135–145)

## 2020-07-07 ENCOUNTER — Other Ambulatory Visit: Payer: Self-pay

## 2020-07-07 ENCOUNTER — Ambulatory Visit (HOSPITAL_COMMUNITY)
Admission: RE | Admit: 2020-07-07 | Discharge: 2020-07-07 | Disposition: A | Discharge status: Home / Self Care | Payer: Medicare Other | Source: Ambulatory Visit | Attending: Cardiology | Admitting: Cardiology

## 2020-07-07 ENCOUNTER — Ambulatory Visit (HOSPITAL_COMMUNITY): Admission: RE | Admit: 2020-07-07 | Payer: Medicare Other | Source: Ambulatory Visit

## 2020-07-07 DIAGNOSIS — I77810 Thoracic aortic ectasia: Secondary | ICD-10-CM | POA: Insufficient documentation

## 2020-07-07 DIAGNOSIS — Z0389 Encounter for observation for other suspected diseases and conditions ruled out: Secondary | ICD-10-CM | POA: Diagnosis not present

## 2020-07-07 MED ORDER — IOHEXOL 350 MG/ML SOLN
100.0000 mL | Freq: Once | INTRAVENOUS | Status: AC | PRN
  Administered 2020-07-07: 100 mL via INTRAVENOUS

## 2020-07-10 ENCOUNTER — Telehealth: Payer: Self-pay | Admitting: Cardiology

## 2020-07-10 NOTE — Telephone Encounter (Signed)
New message    Patient called back wants to speak with Cathi - she wants to know why they did not measure the aortic root when she had her CT done

## 2020-07-10 NOTE — Telephone Encounter (Signed)
Patient has 7 month f/u scheduled tomorrow in the [place] office with Dr.McDowell at 8:40 am to discuss CT results.

## 2020-07-10 NOTE — Progress Notes (Signed)
Cardiology Office Note  Date: 07/11/2020   ID: Kristina [name], DOB 1970-11-11, MRN [id number]  PCP:  Celene Squibb, MD  Cardiologist:  Rozann Lesches, MD Electrophysiologist:  None   Chief Complaint  Patient presents with  . Cardiac follow-up    History of Present Illness: Kristina [name] is a 49 y.o. female last seen in March.  She presents for a follow-up visit.  Reports no major change in stamina, has been able to keep her weight down through diet and exercise.  Occasionally experiences an atypical discomfort in her chest.  Recent follow-up chest CTA did not demonstrate any significant aortic dilatation, measurements ranging from 27 mm at the aortic annulus up to 32 mm at the sinotubular junction and ascending aorta.  Sinus of Valsalva measurement was not given.  Overall, reassuring findings however.  We discussed these results today.  I personally reviewed her ECG which shows sinus rhythm with low voltage.  I also went over her medications.  Past Medical History:  Diagnosis Date  . Allergic rhinitis   . Anxiety   . Aortic dilatation (HCC)   . Asthma   . Cervical cancer ([place])   . Chronic back pain   . Chronic cough   . Depression   . Diverticulosis   . Essential hypertension   . GERD (gastroesophageal reflux disease)   . History of bronchitis   . History of pneumonia   . Hx of adenomatous and sessile serrated colonic polyps   . Hyperlipidemia   . Hypothyroidism   . Lumbosacral spondylosis without myelopathy    Bulging disc - chronic pain  . Obesity    Bariatric surgery  . Obstructive sleep apnea   . Restless leg   . Type 2 diabetes mellitus ([place])   . Vitamin D deficiency   . Vocal cord dysfunction     Past Surgical History:  Procedure Laterality Date  . ABDOMINAL HYSTERECTOMY  1996  . CHOLECYSTECTOMY  1994  . COLONOSCOPY WITH PROPOFOL N/A 03/10/2015   Procedure: COLONOSCOPY WITH PROPOFOL;  Surgeon: [name], MD;  Location: [facility];   Service: Endoscopy;  Laterality: N/A;  . ESOPHAGOGASTRODUODENOSCOPY N/A 10/27/2013   Procedure: ESOPHAGOGASTRODUODENOSCOPY (EGD);  Surgeon: [name], MD;  Location: [facility];  Service: Endoscopy;  Laterality: N/A;  . KNEE ARTHROSCOPY Right   . LAPAROSCOPIC GASTRIC SLEEVE RESECTION WITH HIATAL HERNIA REPAIR N/A 09/19/2015   Procedure: LAPAROSCOPIC GASTRIC SLEEVE RESECTION WITH HIATAL HERNIA REPAIR;  Surgeon: [name], MD;  Location: [facility];  Service: General;  Laterality: N/A;  . LEFT HEART CATH AND CORONARY ANGIOGRAPHY N/A 05/17/2019   Procedure: LEFT HEART CATH AND CORONARY ANGIOGRAPHY;  Surgeon: Jettie Booze, MD;  Location: [facility];  Service: Cardiovascular;  Laterality: N/A;  . LIPOSUCTION N/A 11/26/2018   Procedure: LIPOSUCTION;  Surgeon: Wallace Going, DO;  Location: [facility];  Service: Plastics;  Laterality: N/A;  . NOSE SURGERY  2004  . ovaries removed    . PANNICULECTOMY N/A 11/26/2018   Procedure: PANNICULECTOMY;  Surgeon: Wallace Going, DO;  Location: [facility];  Service: Plastics;  Laterality: N/A;  . TUBAL LIGATION    . UPPER GI ENDOSCOPY  09/19/2015   Procedure: UPPER GI ENDOSCOPY;  Surgeon: [name], MD;  Location: [facility];  Service: General;;    Current Outpatient Medications  Medication Sig Dispense Refill  . albuterol (VENTOLIN HFA) 108 (90 Base) MCG/ACT inhaler Inhale 1-2 puffs into the lungs  every 6 (six) hours as needed for wheezing or shortness of breath.    [name] buPROPion (WELLBUTRIN SR) 200 MG 12 hr tablet Take 200 mg by mouth 2 (two) times daily.    . cyanocobalamin (,VITAMIN B-12,) 1000 MCG/ML injection INJECT 1 ML (CC) ONCE A WEEK FOR 4 WEEKS THEN ONCE A MONTH    . dexlansoprazole (DEXILANT) 60 MG capsule Take 1 capsule (60 mg total) by mouth daily before breakfast. 90 capsule 3  . ergocalciferol (VITAMIN D2) 1.25 MG (50000 UT) capsule Vitamin D2 1,250 mcg (50,000 unit) capsule  TAKE 1 CAPSULE BY  MOUTH ONCE A WEEK    . levothyroxine (SYNTHROID) 112 MCG tablet Take 112 mcg by mouth 2 (two) times daily.    [name] REPATHA SURECLICK 947 MG/ML SOAJ Inject 1 Syringe into the skin every 14 (fourteen) days.     No current facility-administered medications for this visit.   Allergies:  Ampicillin, Codeine, and Sulfonamide derivatives   ROS:  No palpitations or syncope.  Physical Exam: VS:  BP 112/82   Pulse 75   Ht 5\' 3"  (1.6 m)   Wt 202 lb (91.6 kg)   SpO2 94%   BMI 35.78 kg/m , BMI Body mass index is 35.78 kg/m.  Wt Readings from Last 3 Encounters:  07/11/20 202 lb (91.6 kg)  05/30/20 201 lb 8 oz (91.4 kg)  02/22/20 203 lb 12.8 oz (92.4 kg)    General: Patient appears comfortable at rest. HEENT: Conjunctiva and lids normal, wearing a mask. Neck: No elevated JVP. Lungs: Clear to auscultation, nonlabored breathing at rest. Cardiac: Regular rate and rhythm, no S3 or significant systolic murmur, no pericardial rub. Extremities: No pitting edema.  ECG:  An ECG dated 05/18/2019 was personally reviewed today and demonstrated:  Sinus rhythm with prolonged PR interval.  Recent Labwork: 05/24/2020: Hemoglobin 13.6; Platelet Count 161 07/05/2020: BUN 11; Creatinine, Ser 0.75; Potassium 3.7; Sodium 138     Component Value Date/Time   CHOL 205 (H) 05/17/2019 0447   CHOL 233 (H) 02/05/2018 1055   TRIG 114 05/17/2019 0447   HDL 44 05/17/2019 0447   HDL 51 02/05/2018 1055   CHOLHDL 4.7 05/17/2019 0447   VLDL 23 05/17/2019 0447   LDLCALC 138 (H) 05/17/2019 0447   LDLCALC 151 (H) 02/05/2018 1055    Other Studies Reviewed Today:  Cardiac catheterization 05/17/2019:  The left ventricular systolic function is normal.  LV end diastolic pressure is normal.  The left ventricular ejection fraction is 55-65% by visual estimate.  There is no aortic valve stenosis.  No significant CAD. Continue preventive therapy.  Echocardiogram 05/17/2019: 1. The left ventricle has normal systolic  function with an ejection fraction of 60-65%. The cavity size was normal. Left ventricular diastolic Doppler parameters are consistent with impaired relaxation. No evidence of left ventricular regional wall  motion abnormalities. 2. The right ventricle has normal systolic function. The cavity was normal. There is no increase in right ventricular wall thickness. Right ventricular systolic pressure could not be assessed. 3. The mitral valve is grossly normal. 4. The tricuspid valve is grossly normal. 5. The aortic valve is tricuspid. Aortic valve regurgitation is trivial by color flow Doppler. 6. The aorta is abnormal unless otherwise noted. 7. There is moderate dilatation of the aortic root. Suggest dedicated imaging such as CTA for further evaluation.  Chest CTA 07/07/2020: FINDINGS: Cardiovascular:  Heart:  Heart size unchanged. No pericardial fluid/thickening. No significant coronary calcifications.  Aorta:  No significant aortic valve calcifications.  Estimates are made on this non gated CT study.  Estimated diameter of the annulus on the coronal images 27 mm.  Estimated diameter of the sino-tubular junction on the coronal images, 31-32 mm.  Estimated diameter of the ascending aorta on the axial images, 31-32 mm.  Three vessel arch with patency of the branch vessels. No significant atherosclerotic changes.  Unremarkable appearance of the distal thoracic aorta with normal course caliber and contour.  Pulmonary arteries:  Timing of the contrast bolus not optimized for evaluation of the pulmonary arteries. No filling defects are identified within the proximal pulmonary arteries.  Mediastinum/Nodes: Unremarkable thoracic inlet. No mediastinal adenopathy.  Unremarkable thoracic esophagus.  Small hiatal hernia.  Lungs/Pleura: Central airways are clear. No pleural effusion. No confluent airspace disease.  No pneumothorax.  Upper Abdomen: No  acute. Surgical changes of the stomach. Cholecystectomy.  Musculoskeletal: No acute displaced fracture. Degenerative changes of the spine.  Review of the MIP images confirms the above findings.  IMPRESSION: CT angiogram of the chest is negative for thoracic aortic aneurysm on this non gated study, as measured above.  Assessment and Plan:  1.  History of normal coronary arteries a cardiac catheterization October 2020.  ECG reviewed and stable.  She has a history of atypical chest pain, no progressive symptoms and plan to continue basic risk factor modification strategies.  2.  Essential hypertension, blood pressure is normal today.  She continues to focus on her diet and exercise.  3.  Dilated aortic root at the sinuses of Valsalva, 4.6 cm by CTA in October of last year.  This measurement was not given on the follow-up chest CT this year, but importantly no other aortic dilatation was noted.  We will get an echocardiogram to reassess, this could be an anatomical variant and not necessarily represent any progressive aortic pathology.  Medication Adjustments/Labs and Tests Ordered: Current medicines are reviewed at length with the patient today.  Concerns regarding medicines are outlined above.   Tests Ordered: Orders Placed This Encounter  Procedures  . EKG 12-Lead  . ECHOCARDIOGRAM COMPLETE    Medication Changes: No orders of the defined types were placed in this encounter.   Disposition:  Follow up test results, anticipate yearly follow-up if stable.  Signed, Satira Sark, MD, [facility] 07/11/2020 9:03 AM    [facility] [place], [place], [place] [address] Phone: [phone]; Fax: [phone]

## 2020-07-10 NOTE — Telephone Encounter (Signed)
CT made measurements extending from the aortic annulus up to the sinotubular junction and ascending aorta.  These measurements range from 27 mm proximally up to 32 mm at the sinotubular junction. This does not suggest any significant aortic root dilatation.

## 2020-07-11 ENCOUNTER — Ambulatory Visit (INDEPENDENT_AMBULATORY_CARE_PROVIDER_SITE_OTHER): Payer: Medicare Other | Admitting: Cardiology

## 2020-07-11 ENCOUNTER — Other Ambulatory Visit: Payer: Self-pay

## 2020-07-11 ENCOUNTER — Encounter: Payer: Self-pay | Admitting: Cardiology

## 2020-07-11 VITALS — BP 112/82 | HR 75 | Ht 63.0 in | Wt 202.0 lb

## 2020-07-11 DIAGNOSIS — I10 Essential (primary) hypertension: Secondary | ICD-10-CM | POA: Diagnosis not present

## 2020-07-11 DIAGNOSIS — I77810 Thoracic aortic ectasia: Secondary | ICD-10-CM

## 2020-07-11 NOTE — Patient Instructions (Addendum)

## 2020-07-14 ENCOUNTER — Ambulatory Visit (HOSPITAL_COMMUNITY)
Admission: RE | Admit: 2020-07-14 | Discharge: 2020-07-14 | Disposition: A | Discharge status: Home / Self Care | Payer: Medicare Other | Source: Ambulatory Visit | Attending: Cardiology | Admitting: Cardiology

## 2020-07-14 ENCOUNTER — Other Ambulatory Visit: Payer: Self-pay

## 2020-07-14 DIAGNOSIS — K219 Gastro-esophageal reflux disease without esophagitis: Secondary | ICD-10-CM | POA: Insufficient documentation

## 2020-07-14 DIAGNOSIS — I361 Nonrheumatic tricuspid (valve) insufficiency: Secondary | ICD-10-CM | POA: Diagnosis not present

## 2020-07-14 DIAGNOSIS — E119 Type 2 diabetes mellitus without complications: Secondary | ICD-10-CM | POA: Insufficient documentation

## 2020-07-14 DIAGNOSIS — E785 Hyperlipidemia, unspecified: Secondary | ICD-10-CM | POA: Diagnosis not present

## 2020-07-14 DIAGNOSIS — I10 Essential (primary) hypertension: Secondary | ICD-10-CM | POA: Diagnosis not present

## 2020-07-14 DIAGNOSIS — I77810 Thoracic aortic ectasia: Secondary | ICD-10-CM

## 2020-07-14 LAB — ECHOCARDIOGRAM COMPLETE
AR max vel: 2.77 cm2
AV Area VTI: 2.64 cm2
AV Area mean vel: 2.58 cm2
AV Mean grad: 3.6 mmHg
AV Peak grad: 6.8 mmHg
Ao pk vel: 1.3 m/s
Area-P 1/2: 3.2 cm2
S' Lateral: 2.32 cm

## 2020-07-14 NOTE — Progress Notes (Signed)
*  PRELIMINARY RESULTS* Echocardiogram 2D Echocardiogram has been performed.  Kristina [name] 07/14/2020, 3:02 PM

## 2020-07-17 ENCOUNTER — Telehealth: Payer: Self-pay | Admitting: *Deleted

## 2020-07-17 NOTE — Telephone Encounter (Signed)
-----   Message from Satira Sark, MD sent at 07/16/2020  7:47 PM EDT ----- Results reviewed.  Aortic root measurement at sinuses of Valsalva 43 mm, mildly dilated range.  This measurement was not reported by the recent chest CT, but importantly there was no other aortic dilatation and this may represent an anatomical variant.  For now we will continue with 1 year follow-up, we should do an echocardiogram with visit in 1 year rather than a chest CT.

## 2020-07-17 NOTE — Telephone Encounter (Signed)
Pt voiced understanding - is requested to know what aortic root measurement was in cm (4.3 cm per echo report) - recall placed for f/u and repeat echo

## 2020-07-20 DIAGNOSIS — Z20822 Contact with and (suspected) exposure to covid-19: Secondary | ICD-10-CM | POA: Diagnosis not present

## 2020-08-08 ENCOUNTER — Ambulatory Visit (INDEPENDENT_AMBULATORY_CARE_PROVIDER_SITE_OTHER): Payer: Medicare Other | Admitting: Plastic Surgery

## 2020-08-08 ENCOUNTER — Other Ambulatory Visit: Payer: Self-pay

## 2020-08-08 ENCOUNTER — Encounter: Payer: Self-pay | Admitting: Plastic Surgery

## 2020-08-08 VITALS — BP 114/77 | HR 99 | Temp 98.1°F | Ht 63.0 in | Wt 203.8 lb

## 2020-08-08 DIAGNOSIS — M793 Panniculitis, unspecified: Secondary | ICD-10-CM

## 2020-08-08 NOTE — Progress Notes (Signed)
   Subjective:    Patient ID: Kristina [name], female    DOB: [birth year], 49 y.o.   MRN: [id number]  The patient is a 49 year old female here for consultation for her lower abdominal area.  She had a panniculectomy in March 2020 and has done extremely well.  She is 5 feet 3 inches tall and weighs 200 pounds.  She gained about 30 pounds over the past year and was able to decrease it for her son's wedding.  She has a large bulge in the suprapubic area.  She says it is creating irritation.  It is also very embarrassing and aggravating.  She was through her close.  She has not had any other issues and is very pleased with her other results. No sign of hernia.  All incisions are well healed.   Review of Systems  Constitutional: Negative.   HENT: Negative.   Eyes: Negative.   Respiratory: Negative.  Negative for chest tightness.   Cardiovascular: Negative.  Negative for leg swelling.  Gastrointestinal: Negative for abdominal distention.  Endocrine: Negative.   Genitourinary: Negative.   Musculoskeletal: Negative.   Neurological: Negative.        Objective:   Physical Exam Vitals and nursing note reviewed.  Constitutional:      Appearance: Normal appearance.  HENT:     Head: Normocephalic and atraumatic.  Cardiovascular:     Rate and Rhythm: Normal rate.     Pulses: Normal pulses.  Pulmonary:     Effort: Pulmonary effort is normal.  Abdominal:     General: Abdomen is flat. There is no distension.     Tenderness: There is no abdominal tenderness.  Skin:    General: Skin is warm.     Capillary Refill: Capillary refill takes less than 2 seconds.  Neurological:     General: No focal deficit present.     Mental Status: She is alert and oriented to person, place, and time.  Psychiatric:        Mood and Affect: Mood normal.        Behavior: Behavior normal.         Assessment & Plan:     ICD-10-CM   1. Panniculitis  M79.3    The patient is interested in revision of the lower  medial panniculectomy suprapubic site.  This could be done with direct excision and liposuction.   Pictures were obtained of the patient and placed in the chart with the patient's or guardian's permission.

## 2020-08-09 DIAGNOSIS — S29012A Strain of muscle and tendon of back wall of thorax, initial encounter: Secondary | ICD-10-CM | POA: Diagnosis not present

## 2020-08-22 ENCOUNTER — Other Ambulatory Visit (HOSPITAL_COMMUNITY): Payer: Self-pay | Admitting: Adult Health Nurse Practitioner

## 2020-08-22 ENCOUNTER — Ambulatory Visit (HOSPITAL_COMMUNITY)
Admission: RE | Admit: 2020-08-22 | Discharge: 2020-08-22 | Disposition: A | Discharge status: Home / Self Care | Payer: Medicare Other | Source: Ambulatory Visit | Attending: Adult Health Nurse Practitioner | Admitting: Adult Health Nurse Practitioner

## 2020-08-22 ENCOUNTER — Other Ambulatory Visit: Payer: Self-pay

## 2020-08-22 DIAGNOSIS — R252 Cramp and spasm: Secondary | ICD-10-CM

## 2020-08-22 DIAGNOSIS — M47812 Spondylosis without myelopathy or radiculopathy, cervical region: Secondary | ICD-10-CM | POA: Diagnosis not present

## 2020-08-22 DIAGNOSIS — M545 Low back pain, unspecified: Secondary | ICD-10-CM | POA: Insufficient documentation

## 2020-08-22 DIAGNOSIS — M47814 Spondylosis without myelopathy or radiculopathy, thoracic region: Secondary | ICD-10-CM | POA: Diagnosis not present

## 2020-08-29 ENCOUNTER — Inpatient Hospital Stay: Payer: Medicare Other | Attending: Hematology and Oncology

## 2020-08-31 ENCOUNTER — Inpatient Hospital Stay: Payer: Medicare Other | Admitting: Hematology and Oncology

## 2020-08-31 NOTE — Assessment & Plan Note (Deleted)
Lab review: 01/28/2020: Ferritin 9, serum iron 37 B12 greater than 8835, folic acid 84.4 Status post bariatric surgery (laparoscopic sleeve gastrectomy with hiatal hernia repair 09/19/2015)  IV iron: June 2021  Lab review: 05/24/2020: Hemoglobin 13.6, MCV 87.3, TIBC 354, iron saturation 36%, ferritin 161 (was at 9)    Recheck labs in 3 months and follow-up after that with MyChart virtual visit.

## 2020-09-12 ENCOUNTER — Encounter: Payer: Medicare Other | Admitting: Plastic Surgery

## 2020-10-11 ENCOUNTER — Telehealth: Payer: Self-pay | Admitting: Hematology and Oncology

## 2020-10-11 DIAGNOSIS — Z03818 Encounter for observation for suspected exposure to other biological agents ruled out: Secondary | ICD-10-CM | POA: Diagnosis not present

## 2020-10-11 DIAGNOSIS — Z20822 Contact with and (suspected) exposure to covid-19: Secondary | ICD-10-CM | POA: Diagnosis not present

## 2020-10-11 NOTE — Telephone Encounter (Signed)
Left message with rescheduled appointment per 1/26 schedule message. Gave option to call back to reschedule if needed.

## 2020-10-15 NOTE — Progress Notes (Signed)
  HEMATOLOGY-ONCOLOGY MYCHART VIDEO VISIT PROGRESS NOTE  I connected with ANDA SOBOTTA on 10/16/2020 at  8:45 AM EST by MyChart video conference and verified that I am speaking with the correct person using two identifiers.  I discussed the limitations, risks, security and privacy concerns of performing an evaluation and management service by MyChart and the availability of in person appointments.  I also discussed with the patient that there may be a patient responsible charge related to this service. The patient expressed understanding and agreed to proceed.  Patient's Location: Home Physician Location: Clinic  CHIEF COMPLIANT: Follow-up of iron deficiency anemia  INTERVAL HISTORY: Kristina [name] is a 50 y.o. female with above-mentioned history of iron deficiency anemia treated with IV iron (last 03/10/20).She presents over MyChart todayfor follow-up.  She missed her appointments the last month because of travel and other issues.  She is feeling very fatigued.  She has an appointment for removal of extra skin from her abdomen with Dr. Marla Roe.  She feels that she might have become anemic once again.  Shortness of breath to exertion as well.  Observations/Objective:  There were no vitals filed for this visit. There is no height or weight on file to calculate BMI.  I have reviewed the data as listed CMP Latest Ref Rng & Units 07/05/2020 05/17/2019 05/16/2019  Glucose 70 - 99 mg/dL 120(H) 87 128(H)  BUN 6 - 20 mg/dL 11 12 11   Creatinine 0.44 - 1.00 mg/dL 0.75 0.58 0.66  Sodium 135 - 145 mmol/L 138 141 141  Potassium 3.5 - 5.1 mmol/L 3.7 3.7 3.9  Chloride 98 - 111 mmol/L 105 110 111  CO2 22 - 32 mmol/L 25 23 21(L)  Calcium 8.9 - 10.3 mg/dL 9.1 9.1 9.3  Total Protein 6.5 - 8.1 g/dL - 6.7 -  Total Bilirubin 0.3 - 1.2 mg/dL - 0.2(L) -  Alkaline Phos 38 - 126 U/L - 102 -  AST 15 - 41 U/L - 13(L) -  ALT 0 - 44 U/L - 13 -    Lab Results  Component Value Date   WBC 6.3 05/24/2020    HGB 13.6 05/24/2020   HCT 40.6 05/24/2020   MCV 87.3 05/24/2020   PLT 161 05/24/2020   NEUTROABS 3.2 05/24/2020      Assessment Plan:  Iron deficiency anemia Lab review 05/24/2020: Hemoglobin 13.6 (on 05/17/2027 was 11.5), MCV 87.3, ferritin 161, iron saturation 36% Status post bariatric surgery (laparoscopic sleeve gastrectomy with hiatal hernia repair 09/19/2015)  IV iron: June 2021  Lab review: Hemoglobin 13.6, MCV 87.3, ferritin 96, iron saturation 50% D32 and folic acid were normal Based on these lab results she does not need intravenous iron therapy.  Return to clinic in 6 months with labs done ahead of time and follow-up    I discussed the assessment and treatment plan with the patient. The patient was provided an opportunity to ask questions and all were answered. The patient agreed with the plan and demonstrated an understanding of the instructions. The patient was advised to call back or seek an in-person evaluation if the symptoms worsen or if the condition fails to improve as anticipated.   I provided 20 minutes of face-to-face MyChart video visit time during this encounter.    Rulon Eisenmenger, MD 10/16/2020   I, Molly Dorshimer, am acting as scribe for Nicholas Lose, MD.  I have reviewed the above documentation for accuracy and completeness, and I agree with the above.

## 2020-10-16 ENCOUNTER — Encounter: Payer: Self-pay | Admitting: Surgical

## 2020-10-16 ENCOUNTER — Other Ambulatory Visit: Payer: Self-pay

## 2020-10-16 ENCOUNTER — Ambulatory Visit (INDEPENDENT_AMBULATORY_CARE_PROVIDER_SITE_OTHER): Payer: Medicare Other | Admitting: Surgical

## 2020-10-16 ENCOUNTER — Inpatient Hospital Stay: Payer: Medicare Other | Attending: Hematology and Oncology

## 2020-10-16 ENCOUNTER — Inpatient Hospital Stay: Payer: Medicare Other | Attending: Hematology and Oncology | Admitting: Hematology and Oncology

## 2020-10-16 VITALS — BP 128/79 | HR 74 | Ht 63.0 in | Wt 213.0 lb

## 2020-10-16 DIAGNOSIS — D509 Iron deficiency anemia, unspecified: Secondary | ICD-10-CM | POA: Diagnosis not present

## 2020-10-16 DIAGNOSIS — Z9889 Other specified postprocedural states: Secondary | ICD-10-CM

## 2020-10-16 DIAGNOSIS — E1165 Type 2 diabetes mellitus with hyperglycemia: Secondary | ICD-10-CM

## 2020-10-16 DIAGNOSIS — Z794 Long term (current) use of insulin: Secondary | ICD-10-CM

## 2020-10-16 DIAGNOSIS — M793 Panniculitis, unspecified: Secondary | ICD-10-CM

## 2020-10-16 LAB — CBC WITH DIFFERENTIAL (CANCER CENTER ONLY)
Abs Immature Granulocytes: 0.02 10*3/uL (ref 0.00–0.07)
Basophils Absolute: 0.1 10*3/uL (ref 0.0–0.1)
Basophils Relative: 1 %
Eosinophils Absolute: 0.2 10*3/uL (ref 0.0–0.5)
Eosinophils Relative: 3 %
HCT: 40.1 % (ref 36.0–46.0)
Hemoglobin: 13.7 g/dL (ref 12.0–15.0)
Immature Granulocytes: 0 %
Lymphocytes Relative: 42 %
Lymphs Abs: 2.7 10*3/uL (ref 0.7–4.0)
MCH: 30.3 pg (ref 26.0–34.0)
MCHC: 34.2 g/dL (ref 30.0–36.0)
MCV: 88.7 fL (ref 80.0–100.0)
Monocytes Absolute: 0.4 10*3/uL (ref 0.1–1.0)
Monocytes Relative: 7 %
Neutro Abs: 3 10*3/uL (ref 1.7–7.7)
Neutrophils Relative %: 47 %
Platelet Count: 189 10*3/uL (ref 150–400)
RBC: 4.52 MIL/uL (ref 3.87–5.11)
RDW: 12.5 % (ref 11.5–15.5)
WBC Count: 6.3 10*3/uL (ref 4.0–10.5)
nRBC: 0 % (ref 0.0–0.2)

## 2020-10-16 LAB — IRON AND TIBC
Iron: 92 ug/dL (ref 41–142)
Saturation Ratios: 25 % (ref 21–57)
TIBC: 373 ug/dL (ref 236–444)
UIBC: 281 ug/dL (ref 120–384)

## 2020-10-16 LAB — VITAMIN B12: Vitamin B-12: 973 pg/mL — ABNORMAL HIGH (ref 180–914)

## 2020-10-16 LAB — FOLATE: Folate: 9.5 ng/mL (ref >5.9)

## 2020-10-16 LAB — FERRITIN: Ferritin: 97 ng/mL (ref 11–307)

## 2020-10-16 MED ORDER — ONDANSETRON HCL 4 MG PO TABS: 4.0000 mg | ORAL_TABLET | Freq: Three times a day (TID) | ORAL | 0 refills | Status: DC | PRN

## 2020-10-16 MED ORDER — HYDROCODONE-ACETAMINOPHEN 5-325 MG PO TABS: 1.0000 | ORAL_TABLET | Freq: Four times a day (QID) | ORAL | 0 refills | Status: AC | PRN

## 2020-10-16 MED ORDER — CIPROFLOXACIN HCL 500 MG PO TABS: 500.0000 mg | ORAL_TABLET | Freq: Two times a day (BID) | ORAL | 0 refills | Status: AC

## 2020-10-16 NOTE — H&P (View-Only) (Signed)
Patient ID: Kristina [name], female    DOB: April 17, 1971, 50 y.o.   MRN: [id number]  Chief Complaint  Patient presents with  . Pre-op Exam      ICD-10-CM   1. Panniculitis  M79.3   2. S/P panniculectomy  Z98.890   3. Type 2 diabetes mellitus with hyperglycemia, with long-term current use of insulin (HCC)  E11.65    Z79.4     History of Present Illness: Kristina [name] is a 50 y.o.  female  with a history of panniculectomy in March 2020 with Dr. Marla Roe.  She presents for preoperative evaluation for upcoming procedure, excision of suprapubic area and liposuction of suprapubic area, scheduled for 11/02/2020 with Dr. Marla Roe.  The patient has not had problems with anesthesia. No history of DVT/PE.  No family history of DVT/PE.  No family or personal history of bleeding or clotting disorders.  Patient is not currently taking any blood thinners.  No history of CVA/MI.   Summary of Previous Visit: History of panniculectomy March 2020, she has gained 30 pounds over the past year and was able to decrease this recently for her son's wedding.  She has a large bulge in the suprapubic area.  She says it is creating irritation.  She also reports it is very embarrassing and aggravating.  She has not had any other issues and is very pleased with her other results.  There is no sign of hernia.  All of her incisions are healed.  PMH Significant for: Diabetes mellitus type 2 (most recent a1c < 6 per pt, 5.2 1 year ago), iron deficient anemia requiring IV iron, history of hypertension, history of dilated aortic root, OSA (improved), asthma (no issues x 3 years), GERD, hypothyroidism  The patient gave consent to have this visit done by telemedicine / virtual visit, two identifiers were used to identify patient. This is also consent for access the chart and treat the patient via this visit. The patient is located in her car.  I, the provider, am at the office.  We spent 17 minutes together for the visit.   Joined by telephone.  Past Medical History: Allergies: Allergies  Allergen Reactions  . Ampicillin Anaphylaxis    Has patient had a PCN reaction causing immediate rash, facial/tongue/throat swelling, SOB or lightheadedness with hypotension: Yes Has patient had a PCN reaction causing severe rash involving mucus membranes or skin necrosis: Yes Has patient had a PCN reaction that required hospitalization Yes Has patient had a PCN reaction occurring within the last 10 years: No If all of the above answers are "NO", then may proceed with Cephalosporin use.   . Codeine Hives and Shortness Of Breath  . Sulfonamide Derivatives Itching and Rash    Current Medications:  Current Outpatient Medications:  .  albuterol (VENTOLIN HFA) 108 (90 Base) MCG/ACT inhaler, Inhale 1-2 puffs into the lungs every 6 (six) hours as needed for wheezing or shortness of breath., Disp: , Rfl:  .  buPROPion (WELLBUTRIN SR) 200 MG 12 hr tablet, Take 200 mg by mouth 2 (two) times daily., Disp: , Rfl:  .  cyanocobalamin (,VITAMIN B-12,) 1000 MCG/ML injection, INJECT 1 ML (CC) ONCE A WEEK FOR 4 WEEKS THEN ONCE A MONTH, Disp: , Rfl:  .  dexlansoprazole (DEXILANT) 60 MG capsule, Take 1 capsule (60 mg total) by mouth daily before breakfast., Disp: 90 capsule, Rfl: 3 .  ergocalciferol (VITAMIN D2) 1.25 MG (50000 UT) capsule, Vitamin D2 1,250 mcg (50,000 unit) capsule  TAKE 1 CAPSULE BY MOUTH ONCE A WEEK, Disp: , Rfl:  .  levothyroxine (SYNTHROID) 112 MCG tablet, Take 112 mcg by mouth 2 (two) times daily., Disp: , Rfl:  .  REPATHA SURECLICK [id number] MG/ML SOAJ, Inject 1 Syringe into the skin every 14 (fourteen) days., Disp: , Rfl:   Past Medical Problems: Past Medical History:  Diagnosis Date  . Allergic rhinitis   . Anxiety   . Aortic dilatation (HCC)   . Asthma   . Cervical cancer ([place])   . Chronic back pain   . Chronic cough   . Depression   . Diverticulosis   . Essential hypertension   . GERD (gastroesophageal reflux  disease)   . History of bronchitis   . History of pneumonia   . Hx of adenomatous and sessile serrated colonic polyps   . Hyperlipidemia   . Hypothyroidism   . Lumbosacral spondylosis without myelopathy    Bulging disc - chronic pain  . Obesity    Bariatric surgery  . Obstructive sleep apnea   . Restless leg   . Type 2 diabetes mellitus ([place])   . Vitamin D deficiency   . Vocal cord dysfunction     Past Surgical History: Past Surgical History:  Procedure Laterality Date  . ABDOMINAL HYSTERECTOMY  1996  . CHOLECYSTECTOMY  1994  . COLONOSCOPY WITH PROPOFOL N/A 03/10/2015   Procedure: COLONOSCOPY WITH PROPOFOL;  Surgeon: [name], MD;  Location: [facility];  Service: Endoscopy;  Laterality: N/A;  . ESOPHAGOGASTRODUODENOSCOPY N/A 10/27/2013   Procedure: ESOPHAGOGASTRODUODENOSCOPY (EGD);  Surgeon: [name], MD;  Location: [facility];  Service: Endoscopy;  Laterality: N/A;  . KNEE ARTHROSCOPY Right   . LAPAROSCOPIC GASTRIC SLEEVE RESECTION WITH HIATAL HERNIA REPAIR N/A 09/19/2015   Procedure: LAPAROSCOPIC GASTRIC SLEEVE RESECTION WITH HIATAL HERNIA REPAIR;  Surgeon: [name], MD;  Location: [facility];  Service: General;  Laterality: N/A;  . LEFT HEART CATH AND CORONARY ANGIOGRAPHY N/A 05/17/2019   Procedure: LEFT HEART CATH AND CORONARY ANGIOGRAPHY;  Surgeon: Jettie Booze, MD;  Location: [place] [facility];  Service: Cardiovascular;  Laterality: N/A;  . LIPOSUCTION N/A 11/26/2018   Procedure: LIPOSUCTION;  Surgeon: Wallace Going, DO;  Location: [place];  Service: Plastics;  Laterality: N/A;  . NOSE SURGERY  2004  . ovaries removed    . PANNICULECTOMY N/A 11/26/2018   Procedure: PANNICULECTOMY;  Surgeon: Wallace Going, DO;  Location: [place];  Service: Plastics;  Laterality: N/A;  . TUBAL LIGATION    . UPPER GI ENDOSCOPY  09/19/2015   Procedure: UPPER GI ENDOSCOPY;  Surgeon: [name], MD;  Location: [facility];  Service:  General;;    Social History: Social History   Socioeconomic History  . Marital status: Divorced    Spouse name: Not on file  . Number of children: 3  . Years of education: college  . Highest education level: Not on file  Occupational History  . Occupation: disabled    Fish farm manager: [employer]  Tobacco Use  . Smoking status: Never Smoker  . Smokeless tobacco: Never Used  Vaping Use  . Vaping Use: Never used  Substance and Sexual Activity  . Alcohol use: No  . Drug use: No  . Sexual activity: Yes    Birth control/protection: Surgical  Other Topics Concern  . 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 Emphysema Mother   . Allergies Mother   . Heart disease Mother   . Asthma Mother   . Ovarian cancer Mother   . Hypertension Mother   . Diabetes Mother   . High Cholesterol Mother   . Stroke Mother   . Thyroid disease Mother   . Depression Mother   . Anxiety disorder Mother   . Heart disease Father        deceased at age 79 from heart attack  . Sudden death Father   . High Cholesterol Father   . High blood pressure Father   . Stroke Maternal Grandmother   . Heart disease Maternal Grandmother   . Cancer Maternal Grandfather   . Heart disease Paternal Grandmother   . Multiple sclerosis Paternal Grandmother   . Heart attack Paternal Grandfather   . Thyroid disease Brother        died at 15  . Heart attack Brother        died at 103  . COPD Brother   . Colon cancer Brother 58  . Hyperthyroidism Sister   . Hypertension Brother   . Hypertension Daughter   . Allergies Child   .  Asthma Son   . Asthma Brother   . Asthma Brother   . Lung cancer Maternal Aunt   . COPD Sister        died at 50  . Sudden death Sister   . Asthma Son     Review of Systems: Review of Systems  Constitutional: Negative for chills and fever.  Respiratory: Negative for cough and shortness of breath.   Cardiovascular: Negative for chest pain and leg swelling.  Gastrointestinal: Negative for nausea and vomiting.  Neurological: Negative.     Physical Exam: Vital Signs BP 128/79 (BP Location: Left Arm, Patient Position: Sitting, Cuff Size: Large)   Pulse 74   Ht 5\' 3"  (1.6 m)   Wt 213 lb (96.6 kg)   SpO2 97%   BMI 37.73 kg/m    Assessment/Plan: The patient is scheduled for excision of suprapubic area and liposuction of suprapubic area with Dr. Marla Roe.  Risks, benefits, and alternatives of procedure discussed, questions answered and consent obtained.    Smoking Status: Non-smoker; Counseling Given?  N/A Last Mammogram: N/A; Results: N/A  Caprini Score: 4, moderate; Risk Factors include: Age, BMI greater than 25, and length of planned surgery. Recommendation for mechanical and pharmacological prophylaxis while hospitalized. Encourage early ambulation.   Pictures obtained: At previous visit  Post-op Rx sent to pharmacy: Norco, Zofran, Keflex  Patient was provided with the General Surgical Risk consent document and Pain Medication Agreement prior to their appointment.  They had adequate time to read through the risk consent documents and Pain Medication Agreement. We also discussed them in person together during this preop appointment. All of their questions were answered to their satisfaction.  Recommended calling if they have any further questions.  Risk consent form and Pain Medication Agreement to be scanned into patient's chart.  The risks that can be encountered with and after liposuction were discussed and include the following but no limited to these:  Asymmetry, fluid  accumulation, firmness of  the area, fat necrosis with death of fat tissue, bleeding, infection, delayed healing, anesthesia risks, skin sensation changes, injury to structures including nerves, blood vessels, and muscles which may be temporary or permanent, allergies to tape, suture materials and glues, blood products, topical preparations or injected agents, skin and contour irregularities, skin discoloration and swelling, deep vein thrombosis, cardiac and pulmonary complications, pain, which may persist, persistent pain, recurrence of the lesion, poor healing of the incision, possible need for revisional surgery or staged procedures. Thiere can also be persistent swelling, poor wound healing, rippling or loose skin, worsening of cellulite, swelling, and thermal burn or heat injury from ultrasound with the ultrasound-assisted lipoplasty technique. Any change in weight fluctuations can alter the outcome.  Patient is currently on Repatha for high cholesterol, we will send PCP clearance for her to stop this prior to surgery.  She reports she is scheduled for dose this Sunday.  We will also send PCP clearance for medical clearance.  We discussed increased risks of delayed healing, wounds postoperatively due to use of Repatha.  Patient is understanding of this.  I did discuss with her that despite stopping it prior to surgery, she is still at a higher risk than normal due to the medication.   Electronically signed by: Carola Rhine Janard Culp, PA-C 10/16/2020 2:57 PM

## 2020-10-16 NOTE — Assessment & Plan Note (Signed)
Lab review 05/24/2020: Hemoglobin 13.6 (on 05/17/2027 was 11.5), MCV 87.3, ferritin 161, iron saturation 36% Status post bariatric surgery (laparoscopic sleeve gastrectomy with hiatal hernia repair 09/19/2015)  IV iron: June 2021  Patient did not undergo lab work to assess her current iron status and hemoglobin. I recommended that she come back to the clinic to do the labs.

## 2020-10-16 NOTE — Progress Notes (Signed)
Patient ID: Kristina [name], female    DOB: April 17, 1971, 50 y.o.   MRN: [id number]  Chief Complaint  Patient presents with  . Pre-op Exam      ICD-10-CM   1. Panniculitis  M79.3   2. S/P panniculectomy  Z98.890   3. Type 2 diabetes mellitus with hyperglycemia, with long-term current use of insulin (HCC)  E11.65    Z79.4     History of Present Illness: Kristina [name] is a 50 y.o.  female  with a history of panniculectomy in March 2020 with Dr. Marla Roe.  She presents for preoperative evaluation for upcoming procedure, excision of suprapubic area and liposuction of suprapubic area, scheduled for 11/02/2020 with Dr. Marla Roe.  The patient has not had problems with anesthesia. No history of DVT/PE.  No family history of DVT/PE.  No family or personal history of bleeding or clotting disorders.  Patient is not currently taking any blood thinners.  No history of CVA/MI.   Summary of Previous Visit: History of panniculectomy March 2020, she has gained 30 pounds over the past year and was able to decrease this recently for her son's wedding.  She has a large bulge in the suprapubic area.  She says it is creating irritation.  She also reports it is very embarrassing and aggravating.  She has not had any other issues and is very pleased with her other results.  There is no sign of hernia.  All of her incisions are healed.  PMH Significant for: Diabetes mellitus type 2 (most recent a1c < 6 per pt, 5.2 1 year ago), iron deficient anemia requiring IV iron, history of hypertension, history of dilated aortic root, OSA (improved), asthma (no issues x 3 years), GERD, hypothyroidism  The patient gave consent to have this visit done by telemedicine / virtual visit, two identifiers were used to identify patient. This is also consent for access the chart and treat the patient via this visit. The patient is located in her car.  I, the provider, am at the office.  We spent 17 minutes together for the visit.   Joined by telephone.  Past Medical History: Allergies: Allergies  Allergen Reactions  . Ampicillin Anaphylaxis    Has patient had a PCN reaction causing immediate rash, facial/tongue/throat swelling, SOB or lightheadedness with hypotension: Yes Has patient had a PCN reaction causing severe rash involving mucus membranes or skin necrosis: Yes Has patient had a PCN reaction that required hospitalization Yes Has patient had a PCN reaction occurring within the last 10 years: No If all of the above answers are "NO", then may proceed with Cephalosporin use.   . Codeine Hives and Shortness Of Breath  . Sulfonamide Derivatives Itching and Rash    Current Medications:  Current Outpatient Medications:  .  albuterol (VENTOLIN HFA) 108 (90 Base) MCG/ACT inhaler, Inhale 1-2 puffs into the lungs every 6 (six) hours as needed for wheezing or shortness of breath., Disp: , Rfl:  .  buPROPion (WELLBUTRIN SR) 200 MG 12 hr tablet, Take 200 mg by mouth 2 (two) times daily., Disp: , Rfl:  .  cyanocobalamin (,VITAMIN B-12,) 1000 MCG/ML injection, INJECT 1 ML (CC) ONCE A WEEK FOR 4 WEEKS THEN ONCE A MONTH, Disp: , Rfl:  .  dexlansoprazole (DEXILANT) 60 MG capsule, Take 1 capsule (60 mg total) by mouth daily before breakfast., Disp: 90 capsule, Rfl: 3 .  ergocalciferol (VITAMIN D2) 1.25 MG (50000 UT) capsule, Vitamin D2 1,250 mcg (50,000 unit) capsule  TAKE 1 CAPSULE BY MOUTH ONCE A WEEK, Disp: , Rfl:  .  levothyroxine (SYNTHROID) 112 MCG tablet, Take 112 mcg by mouth 2 (two) times daily., Disp: , Rfl:  .  REPATHA SURECLICK [id number] MG/ML SOAJ, Inject 1 Syringe into the skin every 14 (fourteen) days., Disp: , Rfl:   Past Medical Problems: Past Medical History:  Diagnosis Date  . Allergic rhinitis   . Anxiety   . Aortic dilatation (HCC)   . Asthma   . Cervical cancer ([place])   . Chronic back pain   . Chronic cough   . Depression   . Diverticulosis   . Essential hypertension   . GERD (gastroesophageal reflux  disease)   . History of bronchitis   . History of pneumonia   . Hx of adenomatous and sessile serrated colonic polyps   . Hyperlipidemia   . Hypothyroidism   . Lumbosacral spondylosis without myelopathy    Bulging disc - chronic pain  . Obesity    Bariatric surgery  . Obstructive sleep apnea   . Restless leg   . Type 2 diabetes mellitus ([place])   . Vitamin D deficiency   . Vocal cord dysfunction     Past Surgical History: Past Surgical History:  Procedure Laterality Date  . ABDOMINAL HYSTERECTOMY  1996  . CHOLECYSTECTOMY  1994  . COLONOSCOPY WITH PROPOFOL N/A 03/10/2015   Procedure: COLONOSCOPY WITH PROPOFOL;  Surgeon: [name], MD;  Location: [facility];  Service: Endoscopy;  Laterality: N/A;  . ESOPHAGOGASTRODUODENOSCOPY N/A 10/27/2013   Procedure: ESOPHAGOGASTRODUODENOSCOPY (EGD);  Surgeon: [name], MD;  Location: [facility];  Service: Endoscopy;  Laterality: N/A;  . KNEE ARTHROSCOPY Right   . LAPAROSCOPIC GASTRIC SLEEVE RESECTION WITH HIATAL HERNIA REPAIR N/A 09/19/2015   Procedure: LAPAROSCOPIC GASTRIC SLEEVE RESECTION WITH HIATAL HERNIA REPAIR;  Surgeon: [name], MD;  [facility]: [facility];  Service: General;  Laterality: N/A;  . LEFT HEART CATH AND CORONARY ANGIOGRAPHY N/A 05/17/2019   Procedure: LEFT HEART CATH AND CORONARY ANGIOGRAPHY;  Surgeon: Jettie Booze, MD;  Location: [place] [facility];  Service: Cardiovascular;  Laterality: N/A;  . LIPOSUCTION N/A 11/26/2018   Procedure: LIPOSUCTION;  Surgeon: Wallace Going, DO;  Location: [place];  Service: Plastics;  Laterality: N/A;  . NOSE SURGERY  2004  . ovaries removed    . PANNICULECTOMY N/A 11/26/2018   Procedure: PANNICULECTOMY;  Surgeon: Wallace Going, DO;  Location: [place];  Service: Plastics;  Laterality: N/A;  . TUBAL LIGATION    . UPPER GI ENDOSCOPY  09/19/2015   Procedure: UPPER GI ENDOSCOPY;  Surgeon: [name], MD;  Location: [facility];  Service:  General;;    Social History: Social History   Socioeconomic History  . Marital status: Divorced    Spouse name: Not on file  . Number of children: 3  . Years of education: college  . Highest education level: Not on file  Occupational History  . Occupation: disabled    Fish farm manager: [employer]  Tobacco Use  . Smoking status: Never Smoker  . Smokeless tobacco: Never Used  Vaping Use  . Vaping Use: Never used  Substance and Sexual Activity  . Alcohol use: No  . Drug use: No  . Sexual activity: Yes    Birth control/protection: Surgical  Other Topics Concern  . 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 Emphysema Mother   . Allergies Mother   . Heart disease Mother   . Asthma Mother   . Ovarian cancer Mother   . Hypertension Mother   . Diabetes Mother   . High Cholesterol Mother   . Stroke Mother   . Thyroid disease Mother   . Depression Mother   . Anxiety disorder Mother   . Heart disease Father        deceased at age 28 from heart attack  . Sudden death Father   . High Cholesterol Father   . High blood pressure Father   . Stroke Maternal Grandmother   . Heart disease Maternal Grandmother   . Cancer Maternal Grandfather   . Heart disease Paternal Grandmother   . Multiple sclerosis Paternal Grandmother   . Heart attack Paternal Grandfather   . Thyroid disease Brother        died at 13  . Heart attack Brother        died at 69  . COPD Brother   . Colon cancer Brother 34  . Hyperthyroidism Sister   . Hypertension Brother   . Hypertension Daughter   . Allergies Child   .  Asthma Son   . Asthma Brother   . Asthma Brother   . Lung cancer Maternal Aunt   . COPD Sister        died at 64  . Sudden death Sister   . Asthma Son     Review of Systems: Review of Systems  Constitutional: Negative for chills and fever.  Respiratory: Negative for cough and shortness of breath.   Cardiovascular: Negative for chest pain and leg swelling.  Gastrointestinal: Negative for nausea and vomiting.  Neurological: Negative.     Physical Exam: Vital Signs BP 128/79 (BP Location: Left Arm, Patient Position: Sitting, Cuff Size: Large)   Pulse 74   Ht 5\' 3"  (1.6 m)   Wt 213 lb (96.6 kg)   SpO2 97%   BMI 37.73 kg/m    Assessment/Plan: The patient is scheduled for excision of suprapubic area and liposuction of suprapubic area with Dr. Marla Roe.  Risks, benefits, and alternatives of procedure discussed, questions answered and consent obtained.    Smoking Status: Non-smoker; Counseling Given?  N/A Last Mammogram: N/A; Results: N/A  Caprini Score: 4, moderate; Risk Factors include: Age, BMI greater than 25, and length of planned surgery. Recommendation for mechanical and pharmacological prophylaxis while hospitalized. Encourage early ambulation.   Pictures obtained: At previous visit  Post-op Rx sent to pharmacy: Norco, Zofran, Keflex  Patient was provided with the General Surgical Risk consent document and Pain Medication Agreement prior to their appointment.  They had adequate time to read through the risk consent documents and Pain Medication Agreement. We also discussed them in person together during this preop appointment. All of their questions were answered to their satisfaction.  Recommended calling if they have any further questions.  Risk consent form and Pain Medication Agreement to be scanned into patient's chart.  The risks that can be encountered with and after liposuction were discussed and include the following but no limited to these:  Asymmetry, fluid  accumulation, firmness of  the area, fat necrosis with death of fat tissue, bleeding, infection, delayed healing, anesthesia risks, skin sensation changes, injury to structures including nerves, blood vessels, and muscles which may be temporary or permanent, allergies to tape, suture materials and glues, blood products, topical preparations or injected agents, skin and contour irregularities, skin discoloration and swelling, deep vein thrombosis, cardiac and pulmonary complications, pain, which may persist, persistent pain, recurrence of the lesion, poor healing of the incision, possible need for revisional surgery or staged procedures. Thiere can also be persistent swelling, poor wound healing, rippling or loose skin, worsening of cellulite, swelling, and thermal burn or heat injury from ultrasound with the ultrasound-assisted lipoplasty technique. Any change in weight fluctuations can alter the outcome.  Patient is currently on Repatha for high cholesterol, we will send PCP clearance for her to stop this prior to surgery.  She reports she is scheduled for dose this Sunday.  We will also send PCP clearance for medical clearance.  We discussed increased risks of delayed healing, wounds postoperatively due to use of Repatha.  Patient is understanding of this.  I did discuss with her that despite stopping it prior to surgery, she is still at a higher risk than normal due to the medication.   Electronically signed by: Carola Rhine Jenilyn Magana, PA-C 10/16/2020 2:57 PM

## 2020-10-25 DIAGNOSIS — H5213 Myopia, bilateral: Secondary | ICD-10-CM | POA: Diagnosis not present

## 2020-10-26 ENCOUNTER — Other Ambulatory Visit: Payer: Self-pay

## 2020-10-26 ENCOUNTER — Encounter (HOSPITAL_BASED_OUTPATIENT_CLINIC_OR_DEPARTMENT_OTHER): Payer: Self-pay | Admitting: Plastic Surgery

## 2020-10-26 NOTE — Progress Notes (Signed)
Cardiology notes and testing reviewed with Dr. Sabra Heck, ok for surgery at [facility].

## 2020-10-30 ENCOUNTER — Encounter: Payer: Medicare Other | Attending: General Surgery | Admitting: Skilled Nursing Facility1

## 2020-10-30 ENCOUNTER — Other Ambulatory Visit: Payer: Self-pay

## 2020-10-30 ENCOUNTER — Other Ambulatory Visit (HOSPITAL_COMMUNITY)
Admission: RE | Admit: 2020-10-30 | Discharge: 2020-10-30 | Disposition: A | Discharge status: Home / Self Care | Payer: Medicare Other | Source: Ambulatory Visit | Attending: Plastic Surgery | Admitting: Plastic Surgery

## 2020-10-30 ENCOUNTER — Encounter: Payer: Self-pay | Admitting: Skilled Nursing Facility1

## 2020-10-30 DIAGNOSIS — Z20822 Contact with and (suspected) exposure to covid-19: Secondary | ICD-10-CM | POA: Insufficient documentation

## 2020-10-30 DIAGNOSIS — Z01812 Encounter for preprocedural laboratory examination: Secondary | ICD-10-CM | POA: Diagnosis not present

## 2020-10-30 DIAGNOSIS — E1165 Type 2 diabetes mellitus with hyperglycemia: Secondary | ICD-10-CM | POA: Insufficient documentation

## 2020-10-30 LAB — SARS CORONAVIRUS 2 (TAT 6-24 HRS): SARS Coronavirus 2: NEGATIVE

## 2020-10-30 NOTE — Progress Notes (Signed)
Nutrition Assessment for Bariatric Surgery Medical Nutrition Therapy  Patient was seen on 10/30/2020 for Pre-Operative Nutrition Assessment. Letter of approval faxed to [facility] bariatric surgery program coordinator on 10/30/2020.   Referral stated Supervised Weight Loss (SWL) visits needed: 6  Planned surgery: conversion sleeve to RYGB Pt expectation of surgery: to cure relfux Pt expectation of dietitian: none identified    NUTRITION ASSESSMENT   Anthropometrics  Start weight at [facility]: 214.7 lbs (date: 10/30/2020)  Height: 63 in BMI: 38.03 kg/m2     Clinical  Medical hx: GERD Medications: B 12, vitamin D Labs: B 12 973 Notable signs/symptoms:  Any previous deficiencies? anemia  Micronutrient Nutrition Focused Physical Exam: Hair: No issues observed Eyes: No issues observed Mouth: No issues observed Neck: No issues observed Nails: No issues observed Skin: No issues observed  Lifestyle & Dietary Hx  Previous sleeve 2017.  Pt states she has been doing keto on and off. Pt state she was thinking Keto worked for her but then realized it was actually her motivation. Pt states she does not like plain water.   24-Hr Dietary Recall First Meal: 2 boiled eggs  Snack: protein bar Second Meal: cheese and chicken quesadilla + wings or salad + feta + cranberries + tomato + vinaigrette  Snack: slim jim  Third Meal: salmon or hamburger steak + salad Snack: yogurt + fruit Beverages: coffee + heavy cream + splenda, unsweet tea + splenda, propel zero, water   Estimated Energy Needs Calories: 1200  NUTRITION DIAGNOSIS  Overweight/obesity ([place]-3.3) related to past poor dietary habits and physical inactivity as evidenced by patient w/ planned conversion to RYGB surgery following dietary guidelines for continued weight loss.    NUTRITION INTERVENTION  Nutrition counseling (C-1) and education (E-2) to facilitate bariatric surgery goals.   Pre-Op Goals Reviewed with the  Patient . Track food and beverage intake (pen and paper, MyFitness Pal, Baritastic app, etc.) . Make healthy food choices while monitoring portion sizes . Consume 3 meals per day or try to eat every 3-5 hours . Avoid concentrated sugars and fried foods . Keep sugar & fat in the single digits per serving on food labels . Practice CHEWING your food (aim for applesauce consistency) . Practice not drinking 15 minutes before, during, and 30 minutes after each meal and snack . Avoid all carbonated beverages (ex: soda, sparkling beverages)  . Limit caffeinated beverages (ex: coffee, tea, energy drinks) . Avoid all sugar-sweetened beverages (ex: regular soda, sports drinks)  . Avoid alcohol  . Aim for 64-100 ounces of FLUID daily (with at least half of fluid intake being plain water)  . Aim for at least 60-80 grams of PROTEIN daily . Look for a liquid protein source that contains ?15 g protein and ?5 g carbohydrate (ex: shakes, drinks, shots) . Make a list of non-food related activities . Physical activity is an important part of a healthy lifestyle so keep it moving! The goal is to reach 150 minutes of exercise per week, including cardiovascular and weight baring activity. . Start taking the apropriate multivtimain and calcium . Try alkaline water to help reduce alternative sugars  . Reduce the keto products   *Goals that are bolded indicate the pt would like to start working towards these  Handouts Provided Include  . Bariatric Surgery handouts (Nutrition Visits, Pre-Op Goals, Protein Shakes, Vitamins & Minerals)  Learning Style & Readiness for Change Teaching method utilized: Visual & Auditory  Demonstrated degree of understanding via: Teach Back  Readiness  Level: unknown Barriers to learning/adherence to lifestyle change: diet mentality   RD's Notes for Next Visit . Assess pts adherence to chosen goals      MONITORING & EVALUATION Dietary intake, weekly physical activity, body  weight, and pre-op goals reached at next nutrition visit.    Next Steps  Patient is to follow up at [facility] for Pre-Op Class >2 weeks before surgery for further nutrition education.

## 2020-11-02 ENCOUNTER — Other Ambulatory Visit: Payer: Self-pay

## 2020-11-02 ENCOUNTER — Ambulatory Visit (HOSPITAL_BASED_OUTPATIENT_CLINIC_OR_DEPARTMENT_OTHER)
Admission: RE | Admit: 2020-11-02 | Discharge: 2020-11-02 | Disposition: A | Discharge status: Home / Self Care | Payer: Medicare Other | Attending: Plastic Surgery | Admitting: Plastic Surgery

## 2020-11-02 ENCOUNTER — Ambulatory Visit (HOSPITAL_BASED_OUTPATIENT_CLINIC_OR_DEPARTMENT_OTHER): Payer: Medicare Other | Admitting: Anesthesiology

## 2020-11-02 ENCOUNTER — Encounter (HOSPITAL_BASED_OUTPATIENT_CLINIC_OR_DEPARTMENT_OTHER): Payer: Self-pay | Admitting: Plastic Surgery

## 2020-11-02 ENCOUNTER — Encounter (HOSPITAL_BASED_OUTPATIENT_CLINIC_OR_DEPARTMENT_OTHER)
Admission: RE | Disposition: A | Discharge status: Home / Self Care | Payer: Self-pay | Source: Home / Self Care | Attending: Plastic Surgery

## 2020-11-02 DIAGNOSIS — Z833 Family history of diabetes mellitus: Secondary | ICD-10-CM | POA: Insufficient documentation

## 2020-11-02 DIAGNOSIS — Z90710 Acquired absence of both cervix and uterus: Secondary | ICD-10-CM | POA: Insufficient documentation

## 2020-11-02 DIAGNOSIS — Z794 Long term (current) use of insulin: Secondary | ICD-10-CM | POA: Insufficient documentation

## 2020-11-02 DIAGNOSIS — E881 Lipodystrophy, not elsewhere classified: Secondary | ICD-10-CM | POA: Diagnosis not present

## 2020-11-02 DIAGNOSIS — Z79890 Hormone replacement therapy (postmenopausal): Secondary | ICD-10-CM | POA: Insufficient documentation

## 2020-11-02 DIAGNOSIS — Z79899 Other long term (current) drug therapy: Secondary | ICD-10-CM | POA: Diagnosis not present

## 2020-11-02 DIAGNOSIS — Z9889 Other specified postprocedural states: Secondary | ICD-10-CM | POA: Diagnosis not present

## 2020-11-02 DIAGNOSIS — E785 Hyperlipidemia, unspecified: Secondary | ICD-10-CM | POA: Insufficient documentation

## 2020-11-02 DIAGNOSIS — E7800 Pure hypercholesterolemia, unspecified: Secondary | ICD-10-CM | POA: Diagnosis not present

## 2020-11-02 DIAGNOSIS — Z885 Allergy status to narcotic agent status: Secondary | ICD-10-CM | POA: Insufficient documentation

## 2020-11-02 DIAGNOSIS — Z9884 Bariatric surgery status: Secondary | ICD-10-CM | POA: Diagnosis not present

## 2020-11-02 DIAGNOSIS — L02215 Cutaneous abscess of perineum: Secondary | ICD-10-CM | POA: Diagnosis not present

## 2020-11-02 DIAGNOSIS — M793 Panniculitis, unspecified: Secondary | ICD-10-CM | POA: Diagnosis not present

## 2020-11-02 DIAGNOSIS — Z9049 Acquired absence of other specified parts of digestive tract: Secondary | ICD-10-CM | POA: Diagnosis not present

## 2020-11-02 DIAGNOSIS — I10 Essential (primary) hypertension: Secondary | ICD-10-CM | POA: Insufficient documentation

## 2020-11-02 DIAGNOSIS — Z882 Allergy status to sulfonamides status: Secondary | ICD-10-CM | POA: Insufficient documentation

## 2020-11-02 DIAGNOSIS — G4733 Obstructive sleep apnea (adult) (pediatric): Secondary | ICD-10-CM | POA: Diagnosis not present

## 2020-11-02 DIAGNOSIS — E1165 Type 2 diabetes mellitus with hyperglycemia: Secondary | ICD-10-CM | POA: Insufficient documentation

## 2020-11-02 DIAGNOSIS — Z880 Allergy status to penicillin: Secondary | ICD-10-CM | POA: Diagnosis not present

## 2020-11-02 DIAGNOSIS — Z8249 Family history of ischemic heart disease and other diseases of the circulatory system: Secondary | ICD-10-CM | POA: Insufficient documentation

## 2020-11-02 HISTORY — PX: MASS EXCISION: SHX2000

## 2020-11-02 HISTORY — PX: LIPOSUCTION: SHX10

## 2020-11-02 LAB — GLUCOSE, CAPILLARY: Glucose-Capillary: 102 mg/dL — ABNORMAL HIGH (ref 70–99)

## 2020-11-02 SURGERY — EXCISION MASS
Anesthesia: General | Site: Abdomen

## 2020-11-02 MED ORDER — FENTANYL CITRATE (PF) 100 MCG/2ML IJ SOLN: 25.0000 ug | INTRAMUSCULAR | Status: DC | PRN

## 2020-11-02 MED ORDER — SODIUM CHLORIDE 0.9 % IR SOLN
Status: DC | PRN
  Administered 2020-11-02: 500 mL

## 2020-11-02 MED ORDER — LACTATED RINGERS IV SOLN: INTRAVENOUS | Status: DC

## 2020-11-02 MED ORDER — LIDOCAINE HCL (PF) 1 % IJ SOLN
INTRAMUSCULAR | Status: AC
  Filled 2020-11-02: qty 60

## 2020-11-02 MED ORDER — FENTANYL CITRATE (PF) 100 MCG/2ML IJ SOLN
INTRAMUSCULAR | Status: DC | PRN
  Administered 2020-11-02: 50 ug via INTRAVENOUS
  Administered 2020-11-02: 100 ug via INTRAVENOUS
  Administered 2020-11-02: 50 ug via INTRAVENOUS

## 2020-11-02 MED ORDER — SODIUM CHLORIDE 0.9 % IV SOLN: 250.0000 mL | INTRAVENOUS | Status: DC | PRN

## 2020-11-02 MED ORDER — CHLORHEXIDINE GLUCONATE CLOTH 2 % EX PADS: 6.0000 | MEDICATED_PAD | Freq: Once | CUTANEOUS | Status: DC

## 2020-11-02 MED ORDER — LIDOCAINE HCL (CARDIAC) PF 100 MG/5ML IV SOSY
PREFILLED_SYRINGE | INTRAVENOUS | Status: DC | PRN
  Administered 2020-11-02: 100 mg via INTRAVENOUS

## 2020-11-02 MED ORDER — MIDAZOLAM HCL 2 MG/2ML IJ SOLN
INTRAMUSCULAR | Status: AC
  Filled 2020-11-02: qty 2

## 2020-11-02 MED ORDER — ONDANSETRON HCL 4 MG/2ML IJ SOLN
INTRAMUSCULAR | Status: AC
  Filled 2020-11-02: qty 2

## 2020-11-02 MED ORDER — HYDROCODONE-ACETAMINOPHEN 5-325 MG PO TABS
ORAL_TABLET | ORAL | Status: AC
  Filled 2020-11-02: qty 1

## 2020-11-02 MED ORDER — MIDAZOLAM HCL 5 MG/5ML IJ SOLN
INTRAMUSCULAR | Status: DC | PRN
  Administered 2020-11-02: 2 mg via INTRAVENOUS

## 2020-11-02 MED ORDER — FENTANYL CITRATE (PF) 100 MCG/2ML IJ SOLN
INTRAMUSCULAR | Status: AC
  Filled 2020-11-02: qty 2

## 2020-11-02 MED ORDER — EPINEPHRINE PF 1 MG/ML IJ SOLN
INTRAMUSCULAR | Status: AC
  Filled 2020-11-02: qty 1

## 2020-11-02 MED ORDER — EPHEDRINE 5 MG/ML INJ
INTRAVENOUS | Status: AC
  Filled 2020-11-02: qty 10

## 2020-11-02 MED ORDER — CIPROFLOXACIN IN D5W 400 MG/200ML IV SOLN
400.0000 mg | INTRAVENOUS | Status: AC
  Administered 2020-11-02: 400 mg via INTRAVENOUS

## 2020-11-02 MED ORDER — AMISULPRIDE (ANTIEMETIC) 5 MG/2ML IV SOLN: 10.0000 mg | Freq: Once | INTRAVENOUS | Status: DC | PRN

## 2020-11-02 MED ORDER — DIPHENHYDRAMINE HCL 50 MG/ML IJ SOLN
INTRAMUSCULAR | Status: DC | PRN
  Administered 2020-11-02: 12.5 mg via INTRAVENOUS

## 2020-11-02 MED ORDER — EPINEPHRINE PF 1 MG/ML IJ SOLN: INTRAMUSCULAR | Status: DC | PRN

## 2020-11-02 MED ORDER — HYDROCODONE-ACETAMINOPHEN 5-325 MG PO TABS
1.0000 | ORAL_TABLET | Freq: Once | ORAL | Status: AC | PRN
  Administered 2020-11-02: 1 via ORAL

## 2020-11-02 MED ORDER — SUCCINYLCHOLINE CHLORIDE 200 MG/10ML IV SOSY
PREFILLED_SYRINGE | INTRAVENOUS | Status: AC
  Filled 2020-11-02: qty 10

## 2020-11-02 MED ORDER — ONDANSETRON HCL 4 MG/2ML IJ SOLN
INTRAMUSCULAR | Status: DC | PRN
  Administered 2020-11-02 (×2): 4 mg via INTRAVENOUS

## 2020-11-02 MED ORDER — PROMETHAZINE HCL 25 MG/ML IJ SOLN: 6.2500 mg | INTRAMUSCULAR | Status: DC | PRN

## 2020-11-02 MED ORDER — SODIUM CHLORIDE 0.9% FLUSH: 3.0000 mL | INTRAVENOUS | Status: DC | PRN

## 2020-11-02 MED ORDER — SCOPOLAMINE 1 MG/3DAYS TD PT72
1.0000 | MEDICATED_PATCH | TRANSDERMAL | Status: DC
  Administered 2020-11-02: 1 via TRANSDERMAL

## 2020-11-02 MED ORDER — PROPOFOL 500 MG/50ML IV EMUL
INTRAVENOUS | Status: AC
  Filled 2020-11-02: qty 50

## 2020-11-02 MED ORDER — CIPROFLOXACIN IN D5W 400 MG/200ML IV SOLN
INTRAVENOUS | Status: AC
  Filled 2020-11-02: qty 200

## 2020-11-02 MED ORDER — PROPOFOL 10 MG/ML IV BOLUS
INTRAVENOUS | Status: DC | PRN
  Administered 2020-11-02: 200 mg via INTRAVENOUS

## 2020-11-02 MED ORDER — LIDOCAINE 2% (20 MG/ML) 5 ML SYRINGE
INTRAMUSCULAR | Status: AC
  Filled 2020-11-02: qty 5

## 2020-11-02 MED ORDER — LIDOCAINE-EPINEPHRINE 1 %-1:100000 IJ SOLN
INTRAMUSCULAR | Status: DC | PRN
  Administered 2020-11-02: 10 mL

## 2020-11-02 MED ORDER — ACETAMINOPHEN 500 MG PO TABS: 1000.0000 mg | ORAL_TABLET | Freq: Once | ORAL | Status: DC

## 2020-11-02 MED ORDER — ACETAMINOPHEN 325 MG PO TABS: 650.0000 mg | ORAL_TABLET | ORAL | Status: DC | PRN

## 2020-11-02 MED ORDER — PHENYLEPHRINE 40 MCG/ML (10ML) SYRINGE FOR IV PUSH (FOR BLOOD PRESSURE SUPPORT)
PREFILLED_SYRINGE | INTRAVENOUS | Status: AC
  Filled 2020-11-02: qty 10

## 2020-11-02 MED ORDER — ACETAMINOPHEN 325 MG RE SUPP: 650.0000 mg | RECTAL | Status: DC | PRN

## 2020-11-02 MED ORDER — BUPIVACAINE HCL 0.25 % IJ SOLN
INTRAMUSCULAR | Status: DC | PRN
Start: 2020-11-02 — End: 2020-11-02
  Administered 2020-11-02: 10 mL

## 2020-11-02 MED ORDER — DEXAMETHASONE SODIUM PHOSPHATE 4 MG/ML IJ SOLN
INTRAMUSCULAR | Status: DC | PRN
  Administered 2020-11-02: 10 mg via INTRAVENOUS

## 2020-11-02 MED ORDER — DEXAMETHASONE SODIUM PHOSPHATE 10 MG/ML IJ SOLN
INTRAMUSCULAR | Status: AC
  Filled 2020-11-02: qty 1

## 2020-11-02 MED ORDER — DIPHENHYDRAMINE HCL 50 MG/ML IJ SOLN
INTRAMUSCULAR | Status: AC
  Filled 2020-11-02: qty 1

## 2020-11-02 MED ORDER — LIDOCAINE HCL 1 % IJ SOLN
INTRAVENOUS | Status: DC | PRN
  Administered 2020-11-02: 250 mL

## 2020-11-02 MED ORDER — SODIUM CHLORIDE 0.9% FLUSH: 3.0000 mL | Freq: Two times a day (BID) | INTRAVENOUS | Status: DC

## 2020-11-02 SURGICAL SUPPLY — 100 items
ADH SKN CLS APL DERMABOND .7 (GAUZE/BANDAGES/DRESSINGS) ×1
APL SKNCLS STERI-STRIP NONHPOA (GAUZE/BANDAGES/DRESSINGS)
BAG DECANTER FOR FLEXI CONT (MISCELLANEOUS) ×2 IMPLANT
BAND INSRT 18 STRL LF DISP RB (MISCELLANEOUS)
BAND RUBBER #18 3X1/16 STRL (MISCELLANEOUS) IMPLANT
BENZOIN TINCTURE PRP APPL 2/3 (GAUZE/BANDAGES/DRESSINGS) IMPLANT
BINDER ABDOMINAL  9 SM 30-45 (SOFTGOODS)
BINDER ABDOMINAL 10 UNV 27-48 (MISCELLANEOUS) IMPLANT
BINDER ABDOMINAL 12 SM 30-45 (SOFTGOODS) ×1 IMPLANT
BINDER ABDOMINAL 9 SM 30-45 (SOFTGOODS) IMPLANT
BINDER BREAST LRG (GAUZE/BANDAGES/DRESSINGS) IMPLANT
BINDER BREAST MEDIUM (GAUZE/BANDAGES/DRESSINGS) IMPLANT
BINDER BREAST XLRG (GAUZE/BANDAGES/DRESSINGS) IMPLANT
BINDER BREAST XXLRG (GAUZE/BANDAGES/DRESSINGS) IMPLANT
BLADE CLIPPER SURG (BLADE) ×1 IMPLANT
BLADE HEX COATED 2.75 (ELECTRODE) ×1 IMPLANT
BLADE SURG 10 STRL SS (BLADE) ×1 IMPLANT
BLADE SURG 15 STRL LF DISP TIS (BLADE) ×1 IMPLANT
BLADE SURG 15 STRL SS (BLADE) ×2
BNDG CONFORM 2 STRL LF (GAUZE/BANDAGES/DRESSINGS) IMPLANT
BNDG ELASTIC 2X5.8 VLCR STR LF (GAUZE/BANDAGES/DRESSINGS) IMPLANT
BNDG GAUZE ELAST 4 BULKY (GAUZE/BANDAGES/DRESSINGS) ×2 IMPLANT
CANISTER SUCT 1200ML W/VALVE (MISCELLANEOUS) ×2 IMPLANT
CLEANER CAUTERY TIP 5X5 PAD (MISCELLANEOUS) IMPLANT
CORD BIPOLAR FORCEPS 12FT (ELECTRODE) IMPLANT
COVER BACK TABLE 60X90IN (DRAPES) ×2 IMPLANT
COVER MAYO STAND STRL (DRAPES) ×2 IMPLANT
COVER WAND RF STERILE (DRAPES) IMPLANT
DECANTER SPIKE VIAL GLASS SM (MISCELLANEOUS) IMPLANT
DERMABOND ADVANCED (GAUZE/BANDAGES/DRESSINGS) ×1
DERMABOND ADVANCED .7 DNX12 (GAUZE/BANDAGES/DRESSINGS) IMPLANT
DRAPE LAPAROSCOPIC ABDOMINAL (DRAPES) ×2 IMPLANT
DRAPE LAPAROTOMY 100X72 PEDS (DRAPES) IMPLANT
DRAPE U-SHAPE 76X120 STRL (DRAPES) ×1 IMPLANT
DRSG PAD ABDOMINAL 8X10 ST (GAUZE/BANDAGES/DRESSINGS) ×3 IMPLANT
DRSG TEGADERM 2-3/8X2-3/4 SM (GAUZE/BANDAGES/DRESSINGS) IMPLANT
DRSG TEGADERM 4X4.75 (GAUZE/BANDAGES/DRESSINGS) IMPLANT
ELECT BLADE 4.0 EZ CLEAN MEGAD (MISCELLANEOUS)
ELECT COATED BLADE 2.86 ST (ELECTRODE) IMPLANT
ELECT NDL BLADE 2-5/6 (NEEDLE) IMPLANT
ELECT NEEDLE BLADE 2-5/6 (NEEDLE) IMPLANT
ELECT REM PT RETURN 9FT ADLT (ELECTROSURGICAL) ×2
ELECT REM PT RETURN 9FT PED (ELECTROSURGICAL)
ELECTRODE BLDE 4.0 EZ CLN MEGD (MISCELLANEOUS) IMPLANT
ELECTRODE REM PT RETRN 9FT PED (ELECTROSURGICAL) IMPLANT
ELECTRODE REM PT RTRN 9FT ADLT (ELECTROSURGICAL) ×1 IMPLANT
GAUZE SPONGE 4X4 12PLY STRL LF (GAUZE/BANDAGES/DRESSINGS) ×1 IMPLANT
GLOVE SURG ENC MOIS LTX SZ6.5 (GLOVE) ×5 IMPLANT
GOWN STRL REUS W/ TWL LRG LVL3 (GOWN DISPOSABLE) ×2 IMPLANT
GOWN STRL REUS W/TWL LRG LVL3 (GOWN DISPOSABLE) ×6
LINER CANISTER 1000CC FLEX (MISCELLANEOUS) ×2 IMPLANT
NDL HYPO 30GX1 BEV (NEEDLE) IMPLANT
NDL PRECISIONGLIDE 27X1.5 (NEEDLE) ×1 IMPLANT
NDL SAFETY ECLIPSE 18X1.5 (NEEDLE) ×1 IMPLANT
NEEDLE HYPO 18GX1.5 SHARP (NEEDLE) ×2
NEEDLE HYPO 30GX1 BEV (NEEDLE) IMPLANT
NEEDLE PRECISIONGLIDE 27X1.5 (NEEDLE) IMPLANT
NS IRRIG 1000ML POUR BTL (IV SOLUTION) ×1 IMPLANT
PACK BASIN DAY SURGERY FS (CUSTOM PROCEDURE TRAY) ×2 IMPLANT
PAD ALCOHOL SWAB (MISCELLANEOUS) ×2 IMPLANT
PAD CLEANER CAUTERY TIP 5X5 (MISCELLANEOUS)
PENCIL SMOKE EVACUATOR (MISCELLANEOUS) ×2 IMPLANT
SHEET MEDIUM DRAPE 40X70 STRL (DRAPES) IMPLANT
SLEEVE SCD COMPRESS KNEE MED (MISCELLANEOUS) ×2 IMPLANT
SPONGE GAUZE 2X2 8PLY STRL LF (GAUZE/BANDAGES/DRESSINGS) IMPLANT
SPONGE LAP 18X18 RF (DISPOSABLE) ×3 IMPLANT
STRIP CLOSURE SKIN 1/2X4 (GAUZE/BANDAGES/DRESSINGS) ×1 IMPLANT
SUCTION FRAZIER HANDLE 10FR (MISCELLANEOUS)
SUCTION TUBE FRAZIER 10FR DISP (MISCELLANEOUS) IMPLANT
SUT MNCRL 6-0 UNDY P1 1X18 (SUTURE) IMPLANT
SUT MNCRL AB 3-0 PS2 18 (SUTURE) ×1 IMPLANT
SUT MNCRL AB 4-0 PS2 18 (SUTURE) ×2 IMPLANT
SUT MON AB 3-0 SH 27 (SUTURE) ×2
SUT MON AB 3-0 SH27 (SUTURE) IMPLANT
SUT MON AB 5-0 P3 18 (SUTURE) ×1 IMPLANT
SUT MON AB 5-0 PS2 18 (SUTURE) ×3 IMPLANT
SUT MONOCRYL 6-0 P1 1X18 (SUTURE)
SUT PDS 3-0 CT2 (SUTURE) ×2
SUT PDS II 3-0 CT2 27 ABS (SUTURE) IMPLANT
SUT PROLENE 5 0 P 3 (SUTURE) IMPLANT
SUT PROLENE 5 0 PS 2 (SUTURE) IMPLANT
SUT PROLENE 6 0 P 1 18 (SUTURE) IMPLANT
SUT VIC AB 3-0 SH 27 (SUTURE)
SUT VIC AB 3-0 SH 27X BRD (SUTURE) IMPLANT
SUT VIC AB 5-0 P-3 18X BRD (SUTURE) IMPLANT
SUT VIC AB 5-0 P3 18 (SUTURE)
SUT VIC AB 5-0 PS2 18 (SUTURE) IMPLANT
SUT VICRYL 4-0 PS2 18IN ABS (SUTURE) IMPLANT
SYR 50ML LL SCALE MARK (SYRINGE) ×2 IMPLANT
SYR BULB EAR ULCER 3OZ GRN STR (SYRINGE) IMPLANT
SYR BULB IRRIG 60ML STRL (SYRINGE) ×1 IMPLANT
SYR CONTROL 10ML LL (SYRINGE) ×2 IMPLANT
SYR TB 1ML LL NO SAFETY (SYRINGE) ×2 IMPLANT
TOWEL GREEN STERILE FF (TOWEL DISPOSABLE) ×4 IMPLANT
TRAY DSU PREP LF (CUSTOM PROCEDURE TRAY) ×2 IMPLANT
TUBE CONNECTING 20X1/4 (TUBING) ×2 IMPLANT
TUBING INFILTRATION IT-10001 (TUBING) IMPLANT
TUBING SET GRADUATE ASPIR 12FT (MISCELLANEOUS) ×2 IMPLANT
UNDERPAD 30X36 HEAVY ABSORB (UNDERPADS AND DIAPERS) ×4 IMPLANT
YANKAUER SUCT BULB TIP NO VENT (SUCTIONS) ×2 IMPLANT

## 2020-11-02 NOTE — Op Note (Signed)
DATE OF OPERATION: 11/02/2020  LOCATION: [facility] operating Room  PREOPERATIVE DIAGNOSIS: Lipodystrophy of pubic area  POSTOPERATIVE DIAGNOSIS: Same  PROCEDURE: Excision of pubic area lipodystrophy 4 x 15 cm  SURGEON: Brendan Gadson [employer], DO  ASSISTANT: Phoebe Sharps, PA  EBL: 5 cc cc  CONDITION: Stable  COMPLICATIONS: None  INDICATION: The patient, Kristina [name], is a 50 y.o. female born on 05-02-1971, is here for treatment lipodystrophy of the pubic area.   PROCEDURE DETAILS:  The patient was seen prior to surgery and marked.  The IV antibiotics were given. The patient was taken to the operating room and given a general anesthetic. A standard time out was performed and all information was confirmed by those in the room. SCDs were placed.   The patient was prepped and draped.  Local with epinephrine was injected at the previous panniculectomy site.  The scar was marked at the panniculectomy site.  A #15 blade was used to make a 2 mm incision in the upper right portion of the mons area.  The tumescent was infused.  We waited several minutes for the tumescent to take effect and then liposuction was performed.  A total of 400 cc was removed.  The upper mons area was marked to include the scar from the panniculectomy.  An area approximately 15 cm was marked and a 4 cm thickness was excised including fat at the upper pole for area of 4 x 15 cm. The deep layer was closed with 3-0 PDS followed by 3-0 Monocryl.  The 4-0 Monocryl was then placed and a 5-0 Monocryl was used to close the skin.  Dermabond and Steri-Strips were applied.  The patient was allowed to wake up and taken to recovery room in stable condition at the end of the case. The family was notified at the end of the case.   The advanced practice practitioner (APP) assisted throughout the case.  The APP was essential in retraction and counter traction when needed to make the case progress smoothly.  This retraction and  assistance made it possible to see the tissue plans for the procedure.  The assistance was needed for blood control, tissue re-approximation and assisted with closure of the incision site.

## 2020-11-02 NOTE — Anesthesia Postprocedure Evaluation (Signed)
Anesthesia Post Note  Patient: Kristina [name]  Procedure(s) Performed: Excision of suprapubic area (N/A Abdomen) with liposuction (N/A Abdomen)     Patient location during evaluation: PACU Anesthesia Type: General Level of consciousness: awake and alert Pain management: pain level controlled Vital Signs Assessment: post-procedure vital signs reviewed and stable Respiratory status: spontaneous breathing, nonlabored ventilation and respiratory function stable Cardiovascular status: blood pressure returned to baseline and stable Postop Assessment: no apparent nausea or vomiting Anesthetic complications: no   No complications documented.  Last Vitals:  Vitals:   11/02/20 1115 11/02/20 1154  BP: 134/87 123/89  Pulse: 90 96  Resp: 15 18  Temp:  36.5 C  SpO2: 99% 100%    Last Pain:  Vitals:   11/02/20 1159  TempSrc:   PainSc: 4                  Candra R Waldon Sheerin

## 2020-11-02 NOTE — Anesthesia Procedure Notes (Signed)
Procedure Name: LMA Insertion Date/Time: 11/02/2020 9:46 AM Performed by: Willa Frater, CRNA Pre-anesthesia Checklist: Patient identified, Emergency Drugs available, Suction available and Patient being monitored Patient Re-evaluated:Patient Re-evaluated prior to induction Oxygen Delivery Method: Circle system utilized Preoxygenation: Pre-oxygenation with 100% oxygen Induction Type: IV induction Ventilation: Mask ventilation without difficulty LMA: LMA inserted LMA Size: 4.0 Number of attempts: 1 Airway Equipment and Method: Bite block Placement Confirmation: positive ETCO2 Tube secured with: Tape Dental Injury: Teeth and Oropharynx as per pre-operative assessment

## 2020-11-02 NOTE — Interval H&P Note (Signed)
History and Physical Interval Note:  11/02/2020 8:56 AM  Kristina [name]  has presented today for surgery, with the diagnosis of panniculitis.  The various methods of treatment have been discussed with the patient and family. After consideration of risks, benefits and other options for treatment, the patient has consented to  Procedure(s) with comments: Excision of suprapubic area (N/A) - 90 min with liposuction (N/A) as a surgical intervention.  The patient's history has been reviewed, patient examined, no change in status, stable for surgery.  I have reviewed the patient's chart and labs.  Questions were answered to the patient's satisfaction.     [name]

## 2020-11-02 NOTE — Anesthesia Preprocedure Evaluation (Addendum)
Anesthesia Evaluation  Patient identified by MRN, date of birth, ID band Patient awake    Reviewed: Allergy & Precautions, NPO status , Patient's Chart, lab work & pertinent test results  History of Anesthesia Complications (+) PONV and history of anesthetic complications  Airway Mallampati: II  TM Distance: >3 FB Neck ROM: Full    Dental  (+) Dental Advisory Given,    Pulmonary asthma , sleep apnea ,    breath sounds clear to auscultation       Cardiovascular hypertension,  Rhythm:Regular Rate:Normal  1. Left ventricular ejection fraction, by estimation, is 60 to 65%. The left ventricle has normal function. The left ventricle has no regional wall motion abnormalities. Left ventricular diastolic parameters are indeterminate. 2. Right ventricular systolic function is normal. The right ventricular size is normal. 3. The mitral valve is normal in structure. No evidence of mitral valve regurgitation. No evidence of mitral stenosis. 4. The aortic valve has an indeterminant number of cusps. Aortic valve regurgitation is not visualized. No aortic stenosis is present. 5. Aortic dilatation noted. There is mild dilatation of the aortic root, measuring 36mm. 6. The inferior vena cava is normal in size with greater than 50% respiratory variability, suggesting right atrial pressure of 3 mmHg.   Neuro/Psych  Headaches, PSYCHIATRIC DISORDERS Anxiety Depression    GI/Hepatic Neg liver ROS, GERD  Controlled and Medicated, S/p gastric sleeve    Endo/Other  diabetes, Type 2Hypothyroidism   Renal/GU negative Renal ROS     Musculoskeletal  (+) Arthritis ,   Abdominal (+) + obese,   Peds  Hematology negative hematology ROS (+) anemia ,   Anesthesia Other Findings   Reproductive/Obstetrics                              Anesthesia Physical  Anesthesia Plan  ASA: III  Anesthesia Plan: General   Post-op  Pain Management:    Induction: Intravenous  PONV Risk Score and Plan: 4 or greater and Treatment may vary due to age or medical condition, Ondansetron, Dexamethasone, Midazolam and Scopolamine patch - Pre-op  Airway Management Planned: Oral ETT  Additional Equipment: None  Intra-op Plan:   Post-operative Plan: Extubation in OR  Informed Consent: I have reviewed the patients History and Physical, chart, labs and discussed the procedure including the risks, benefits and alternatives for the proposed anesthesia with the patient or authorized representative who has indicated his/her understanding and acceptance.     Dental advisory given  Plan Discussed with: CRNA and Anesthesiologist  Anesthesia Plan Comments:         Anesthesia Quick Evaluation

## 2020-11-02 NOTE — Transfer of Care (Signed)
Immediate Anesthesia Transfer of Care Note  Patient: Kristina [name]  Procedure(s) Performed: Excision of suprapubic area (N/A Abdomen) with liposuction (N/A Abdomen)  Patient Location: PACU  Anesthesia Type:General  Level of Consciousness: awake, oriented, drowsy and patient cooperative  Airway & Oxygen Therapy: Patient Spontanous Breathing and Patient connected to face mask oxygen  Post-op Assessment: Report given to RN and Post -op Vital signs reviewed and stable  Post vital signs: Reviewed and stable  Last Vitals:  Vitals Value Taken Time  BP    Temp    Pulse 93 11/02/20 1104  Resp 13 11/02/20 1104  SpO2 97 % 11/02/20 1104  Vitals shown include unvalidated device data.  Last Pain:  Vitals:   11/02/20 0838  TempSrc: Oral  PainSc: 0-No pain      Patients Stated Pain Goal: 3 (41/74/08 1448)  Complications: No complications documented.

## 2020-11-02 NOTE — Discharge Instructions (Signed)

## 2020-11-03 ENCOUNTER — Telehealth: Payer: Self-pay

## 2020-11-03 NOTE — Telephone Encounter (Signed)
Patient called to speak with someone regarding her surgery with Dr. Marla Roe yesterday.  She said the incision is higher than it was supposed to be and her suprapubic still sags.  Patient said that she understands that some swelling is to be expected after surgery, but it looks like she has three inches of penis.  Please call.

## 2020-11-06 ENCOUNTER — Encounter (HOSPITAL_BASED_OUTPATIENT_CLINIC_OR_DEPARTMENT_OTHER): Payer: Self-pay | Admitting: Plastic Surgery

## 2020-11-07 ENCOUNTER — Telehealth: Payer: Self-pay

## 2020-11-07 NOTE — Telephone Encounter (Signed)
11/03/20 -Call returned to pt regarding her concerns with the following: Surgery was 11/02/20 Pt states that she has swelling/fullness in her pubic area.  She wanted confirmation of how much tissue was removed from this area in surgery. She states that " the marks" that were made by Dr. Marla Roe prior to surgery were not the same as the incision site & she wanted to know why the incision was higher than indicated by the "skin marker" lines. I consulted with Dr. Marla Roe about the above concerns. Per Dr. Marla Roe- 400 gms of tissue was removed in surgery. The incision was stratigically placed to avoid any compromise of blood supply to the tissue due to her previous surgical incision in this area. Dr. Marla Roe wanted the pt to know that her surgery was only 24 hrs ago & that most likely she will have even more swelling/fullness in the next few days before the symptoms begin to resolve. I forwarded this information to the pt & reminded her that this post op swelling/bruising is normal & that she should keep her compression garment in place unless bathing. I did speak to her about how the surgeons make the "skin markings" prior to surgery which are an " engineering/plan" for the incision- but incorporate many factors in the decision of where to make the surgical incision.  Many of the ink markings indicate the boundaries & locations of what is to be incised/removed safely. The patient states an understanding of the above explanation & will continue to monitor her post-op recovery- she has a f/u with Dr. Marla Roe on 11/10/20. On further assessment- she indicates that she is having moderate pain- she denies any chills/fever & no n/v. She is reminded to call for any concerns.

## 2020-11-08 NOTE — Progress Notes (Deleted)
Patient is a 50 year old female here for follow-up after undergoing excision of suprapubic area and liposuction on  11/02/2020 with Dr. Marla Roe.  ~ 1 week PO

## 2020-11-10 ENCOUNTER — Encounter: Payer: Medicare Other | Admitting: Plastic Surgery

## 2020-11-10 DIAGNOSIS — Z9889 Other specified postprocedural states: Secondary | ICD-10-CM

## 2020-11-14 ENCOUNTER — Ambulatory Visit (INDEPENDENT_AMBULATORY_CARE_PROVIDER_SITE_OTHER): Payer: Medicare Other | Admitting: Plastic Surgery

## 2020-11-14 ENCOUNTER — Other Ambulatory Visit: Payer: Self-pay

## 2020-11-14 ENCOUNTER — Encounter: Payer: Self-pay | Admitting: Plastic Surgery

## 2020-11-14 VITALS — BP 136/88 | HR 110

## 2020-11-14 DIAGNOSIS — E881 Lipodystrophy, not elsewhere classified: Secondary | ICD-10-CM

## 2020-11-14 NOTE — Progress Notes (Signed)
The patient is a 50 year old female here for follow-up on her mom's excision.  She was initially very upset by the swelling.  She has calmed down quite a bit.  The swelling and the bruising has improved.  We explained to her the reason for the location of the incision on how much have them removed.  She was feeling much better about everything.  I like to see her back in 2 weeks for follow-up.  She can also go into a spanks because it would likely be much more comfortable for her.

## 2020-11-16 DIAGNOSIS — H1045 Other chronic allergic conjunctivitis: Secondary | ICD-10-CM | POA: Diagnosis not present

## 2020-11-24 ENCOUNTER — Other Ambulatory Visit: Payer: Self-pay

## 2020-11-24 ENCOUNTER — Ambulatory Visit (INDEPENDENT_AMBULATORY_CARE_PROVIDER_SITE_OTHER): Payer: Medicare Other | Admitting: Plastic Surgery

## 2020-11-24 ENCOUNTER — Encounter: Payer: Self-pay | Admitting: Plastic Surgery

## 2020-11-24 VITALS — BP 129/85 | HR 92

## 2020-11-24 DIAGNOSIS — E881 Lipodystrophy, not elsewhere classified: Secondary | ICD-10-CM

## 2020-11-24 NOTE — Progress Notes (Signed)
Patient is a 50 year old female here for follow-up on her lipodystrophy surgery on her mons.  She is doing much much better.  The swelling and the bruising has improved.  She is still on her 27-month nutritional regiment to try and improve her reflux.  She will likely undergo a revision to her gastric surgery in August with repair of the hiatal hernia at the same time.  No sign of infection today and overall she is doing really well and very happy for her.  Pictures were obtained of the patient and placed in the chart with the patient's or guardian's permission. Follow-up as needed we remain available if she needs anything.  Continue with spanks.

## 2020-11-29 ENCOUNTER — Encounter: Payer: Medicare Other | Attending: General Surgery | Admitting: Skilled Nursing Facility1

## 2020-11-29 ENCOUNTER — Other Ambulatory Visit: Payer: Self-pay

## 2020-11-29 DIAGNOSIS — E1165 Type 2 diabetes mellitus with hyperglycemia: Secondary | ICD-10-CM | POA: Diagnosis not present

## 2020-11-29 NOTE — Progress Notes (Signed)
Supervised Weight Loss Visit Bariatric Nutrition Education  Planned Surgery: sleeve to RYGB  1 out of 6 SWL Appointments    NUTRITION ASSESSMENT  Anthropometrics  Start weight at [facility]: 214.7 lbs (date: 10/30/2020)  Weight: 216.6 pounds Height: 63 in BMI: 38.37 kg/m2     Clinical  Medical hx: GERD Medications: B 12, vitamin D Labs: B 12 973 Notable signs/symptoms:  Any previous deficiencies? anemia  Lifestyle & Dietary Hx  Pt states she was needing to recover from a skin removal surgery so it has been tough to start changes. Pt states she is avoiding slim jims. Pt states she has gone from 10 splenda to 8. Pt states she likes the alkaline water.   Estimated daily fluid intake:  oz Supplements:  Current average weekly physical activity: ADL's  24-Hr Dietary Recall First Meal: atkins bar  Snack: carrots + peanut butter or apple + peanut butter Second Meal: frozen meal or [place] wrap  Snack: balanced break Third Meal: chicken + baked potato  Snack:  Beverages: 2 coffee + plant based creamer, water, unsweet tea, alkaline water  Estimated Energy Needs Calories: 1500   NUTRITION DIAGNOSIS  Overweight/obesity ([place]-3.3) related to past poor dietary habits and physical inactivity as evidenced by patient w/ planned sleeve to RYGB surgery following dietary guidelines for continued weight loss.   NUTRITION INTERVENTION  Nutrition counseling (C-1) and education (E-2) to facilitate bariatric surgery goals.  Pre-Op Goals Progress & New Goals . Continued: Start taking the apropriate multivtimain and calcium . Continued: Try alkaline water to help reduce alternative sugars  . Continued: Reduce the keto products  . Continued: Chew foods until applesauce consistency . NEW: decrease splenda . NEW: increase water to 3 24 oz cups . NEW: walk 3 days a week for 30 minutes; walk in your building at work if it is going to rain later  [place] for Change Teaching method utilized: Visual & Auditory  Demonstrated degree of understanding via: Teach Back  Readiness Level: Action Barriers to learning/adherence to lifestyle change: unidentified   RD's Notes for next Visit  . Assess pts adherence to chosen goals    MONITORING & EVALUATION Dietary intake, weekly physical activity, body weight, and pre-op goals in 1 month.   Next Steps  Patient is to return to [facility]

## 2020-12-06 DIAGNOSIS — H52223 Regular astigmatism, bilateral: Secondary | ICD-10-CM | POA: Diagnosis not present

## 2020-12-06 DIAGNOSIS — H524 Presbyopia: Secondary | ICD-10-CM | POA: Diagnosis not present

## 2020-12-25 DIAGNOSIS — J0100 Acute maxillary sinusitis, unspecified: Secondary | ICD-10-CM | POA: Diagnosis not present

## 2021-01-02 ENCOUNTER — Other Ambulatory Visit: Payer: Self-pay

## 2021-01-02 ENCOUNTER — Encounter: Payer: Medicare Other | Attending: General Surgery | Admitting: Skilled Nursing Facility1

## 2021-01-02 DIAGNOSIS — E1165 Type 2 diabetes mellitus with hyperglycemia: Secondary | ICD-10-CM | POA: Diagnosis not present

## 2021-01-02 NOTE — Progress Notes (Signed)
Supervised Weight Loss Visit Bariatric Nutrition Education  Planned Surgery: sleeve to RYGB  2 out of 6 SWL Appointments   Given sticker: yes  NUTRITION ASSESSMENT  Anthropometrics  Start weight at [facility]: 214.7 lbs (date: 10/30/2020)  Weight: 214.5 pounds Height: 63 in BMI: 38.00 kg/m2     Clinical  Medical hx: GERD Medications: B 12, vitamin D Labs: B 12 973 Notable signs/symptoms:  Any previous deficiencies? anemia  Lifestyle & Dietary Hx  Pt states she has had a sinus infection for the last 2 weeks. Pt states he feels burnout and does not have the motivation and feels defeated. Pt states she bought the celebrate and says it smells terrible. Pt states she does not absorb iron. Pt states she is trying to reduce her tea with limiting total splendas to 8 per day. Pt states she was able to not use full fat heavy cream instead using low fat plant based creamers. Pt state she has increased her walks to 3 days a Week but wants to increase to 4 days a week. Pt state she is excited to stop this reflux so she can eat more normally.   Estimated daily fluid intake:  oz Supplements: taking calcium  Current average weekly physical activity: ADL's  24-Hr Dietary Recall First Meal: atkins bar or kodiak protein bar Snack: carrots + peanut butter or apple + peanut butter Second Meal: frozen meal or [place] wrap or ham wrap + mac n cheese + green beans or salad + chicken Snack: balanced break or pretzels + cheese  Third Meal: chicken + baked potato + salad Snack:  Beverages: 2 coffee + plant based creamer, water, unsweet tea, alkaline water  Estimated Energy Needs Calories: 1500   NUTRITION DIAGNOSIS  Overweight/obesity ([place]-3.3) related to past poor dietary habits and physical inactivity as evidenced by patient w/ planned sleeve to RYGB surgery following dietary guidelines for continued weight loss.   NUTRITION INTERVENTION  Nutrition counseling (C-1) and education (E-2) to facilitate  bariatric surgery goals as well as diabetes control.   Pre-Op Goals Progress & New Goals . Continued: Start taking the apropriate multivtimain and calcium . Continued: Try alkaline water to help reduce alternative sugars  . Continued: Reduce the keto products  . Continued: Chew foods until applesauce consistency . continue: decrease splenda . continue: increase water to 3 24 oz cups . continue: walk 3 days a week for 30 minutes; walk in your building at work if it is going to rain later . NEW: take the capsule multi now and save the chewable for  After surgery   Handouts Provided Include     Learning Style & Readiness for Change Teaching method utilized: Visual & Auditory  Demonstrated degree of understanding via: Teach Back  Readiness Level: Action Barriers to learning/adherence to lifestyle change: unidentified   RD's Notes for next Visit  . Assess pts adherence to chosen goals    MONITORING & EVALUATION Dietary intake, weekly physical activity, body weight, and pre-op goals in 1 month.   Next Steps  Patient is to return to [facility] for next SWL

## 2021-01-31 ENCOUNTER — Other Ambulatory Visit: Payer: Self-pay

## 2021-01-31 ENCOUNTER — Encounter: Payer: Medicare Other | Attending: General Surgery | Admitting: Skilled Nursing Facility1

## 2021-01-31 DIAGNOSIS — E1165 Type 2 diabetes mellitus with hyperglycemia: Secondary | ICD-10-CM | POA: Diagnosis not present

## 2021-01-31 NOTE — Progress Notes (Signed)
Supervised Weight Loss Visit Bariatric Nutrition Education  Planned Surgery: sleeve to RYGB  3 out of 6 SWL Appointments   Given sticker: yes  NUTRITION ASSESSMENT  Anthropometrics  Start weight at [facility]: 214.7 lbs (date: 10/30/2020)  Weight: 215 pounds Height: 63 in BMI: 38.14 kg/m2     Clinical  Medical hx: GERD Medications: B 12, vitamin D Labs: B 12 973 Notable signs/symptoms:  Any previous deficiencies? anemia  Lifestyle & Dietary Hx  Pt state she broke her tooth on a protein bar. Pt states her sinuses have been acting up so she has not been as active as she have been wanting.  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   Estimated daily fluid intake: aiming for 67 oz Supplements: taking calcium  Current average weekly physical activity: ADL's  24-Hr Dietary Recall First Meal: atkins bar or kodiak protein bar Snack: carrots + peanut butter or apple + peanut butter Second Meal: frozen meal or [place] wrap or ham wrap + mac n cheese + green beans or salad + chicken or chicken salad with berries Snack: balanced break or pretzels + cheese or lance crackers Third Meal: chicken + baked potato + salad or deli sandwich or deli wrap + green beans or peas Snack:  Beverages: 2 coffee + plant based creamer, water, unsweet tea, alkaline water; 7 splendas in a day  Estimated Energy Needs Calories: 1500  NUTRITION DIAGNOSIS  Overweight/obesity ([place]-3.3) related to past poor dietary habits and physical inactivity as evidenced by patient w/ planned sleeve to RYGB surgery following dietary guidelines for continued weight loss.   NUTRITION INTERVENTION  Nutrition counseling (C-1) and education (E-2) to facilitate bariatric surgery goals as well as diabetes control.   Pre-Op Goals Progress & New Goals . Continued: Start taking the apropriate multivtimain and calcium . Continued: Try  alkaline water to help reduce alternative sugars  . Continued: Reduce the keto products  . Continued: Chew foods until applesauce consistency . continue: decrease splenda . continue: increase water to 3 24 oz cups . continue: walk 3 days a week for 30 minutes; walk in your building at work if it is going to rain later . Continue: take the capsule multi now and save the chewable for  After surgery  . NEW: increase water . NEW: increase physical activity   Handouts Provided Include     Learning Style & Readiness for Change Teaching method utilized: Visual & Auditory  Demonstrated degree of understanding via: Teach Back  Readiness Level: Action Barriers to learning/adherence to lifestyle change: unidentified   RD's Notes for next Visit  . Assess pts adherence to chosen goals    MONITORING & EVALUATION Dietary intake, weekly physical activity, body weight, and pre-op goals in 1 month.   Next Steps  Patient is to return to [facility] for next SWL

## 2021-02-06 ENCOUNTER — Encounter (HOSPITAL_COMMUNITY): Payer: Self-pay | Admitting: Emergency Medicine

## 2021-02-06 ENCOUNTER — Emergency Department (HOSPITAL_COMMUNITY)
Admission: EM | Admit: 2021-02-06 | Discharge: 2021-02-06 | Disposition: A | Discharge status: Home / Self Care | Payer: Medicare Other | Attending: Emergency Medicine | Admitting: Emergency Medicine

## 2021-02-06 ENCOUNTER — Other Ambulatory Visit: Payer: Self-pay

## 2021-02-06 DIAGNOSIS — R509 Fever, unspecified: Secondary | ICD-10-CM | POA: Diagnosis present

## 2021-02-06 DIAGNOSIS — Z5321 Procedure and treatment not carried out due to patient leaving prior to being seen by health care provider: Secondary | ICD-10-CM | POA: Diagnosis not present

## 2021-02-06 DIAGNOSIS — R202 Paresthesia of skin: Secondary | ICD-10-CM | POA: Insufficient documentation

## 2021-02-06 DIAGNOSIS — U071 COVID-19: Secondary | ICD-10-CM | POA: Insufficient documentation

## 2021-02-06 LAB — CBC WITH DIFFERENTIAL/PLATELET
Abs Immature Granulocytes: 0.02 10*3/uL (ref 0.00–0.07)
Basophils Absolute: 0 10*3/uL (ref 0.0–0.1)
Basophils Relative: 1 %
Eosinophils Absolute: 0.1 10*3/uL (ref 0.0–0.5)
Eosinophils Relative: 1 %
HCT: 42.1 % (ref 36.0–46.0)
Hemoglobin: 14.1 g/dL (ref 12.0–15.0)
Immature Granulocytes: 0 %
Lymphocytes Relative: 6 %
Lymphs Abs: 0.4 10*3/uL — ABNORMAL LOW (ref 0.7–4.0)
MCH: 30.1 pg (ref 26.0–34.0)
MCHC: 33.5 g/dL (ref 30.0–36.0)
MCV: 89.8 fL (ref 80.0–100.0)
Monocytes Absolute: 0.6 10*3/uL (ref 0.1–1.0)
Monocytes Relative: 11 %
Neutro Abs: 4.5 10*3/uL (ref 1.7–7.7)
Neutrophils Relative %: 81 %
Platelets: 153 10*3/uL (ref 150–400)
RBC: 4.69 MIL/uL (ref 3.87–5.11)
RDW: 12.4 % (ref 11.5–15.5)
WBC: 5.6 10*3/uL (ref 4.0–10.5)
nRBC: 0 % (ref 0.0–0.2)

## 2021-02-06 LAB — RESP PANEL BY RT-PCR (FLU A&B, COVID) ARPGX2
Influenza A by PCR: NEGATIVE
Influenza B by PCR: NEGATIVE
SARS Coronavirus 2 by RT PCR: POSITIVE — AB

## 2021-02-06 LAB — COMPREHENSIVE METABOLIC PANEL
ALT: 16 U/L (ref 0–44)
AST: 17 U/L (ref 15–41)
Albumin: 4 g/dL (ref 3.5–5.0)
Alkaline Phosphatase: 98 U/L (ref 38–126)
Anion gap: 8 (ref 5–15)
BUN: 6 mg/dL (ref 6–20)
CO2: 24 mmol/L (ref 22–32)
Calcium: 9.3 mg/dL (ref 8.9–10.3)
Chloride: 106 mmol/L (ref 98–111)
Creatinine, Ser: 0.8 mg/dL (ref 0.44–1.00)
GFR, Estimated: >60 mL/min (ref >60)
Glucose, Bld: 98 mg/dL (ref 70–99)
Potassium: 3.8 mmol/L (ref 3.5–5.1)
Sodium: 138 mmol/L (ref 135–145)
Total Bilirubin: 0.6 mg/dL (ref 0.3–1.2)
Total Protein: 6.7 g/dL (ref 6.5–8.1)

## 2021-02-06 LAB — TROPONIN I (HIGH SENSITIVITY): Troponin I (High Sensitivity): <2 ng/L (ref <18)

## 2021-02-06 NOTE — ED Triage Notes (Signed)
Pt c/o chest pain, left arm tingling, fever and headache.

## 2021-02-06 NOTE — ED Provider Notes (Signed)
Emergency Medicine Provider Triage Evaluation Note  Kristina [name] , a 50 y.o. female  was evaluated in triage.  Pt complains of chest pain, shortness of breath, cough, fever.  Review of Systems  Positive: Chest pain, shortness of breath, cough, fever Negative: Abdominal pain, upper back pain, neurologic deficits  Physical Exam  BP 125/71 (BP Location: Left Arm)   Pulse (!) 123   Temp (!) 101 F (38.3 C) (Oral)   Resp (!) 1   SpO2 96%  Gen:   Awake, no distress   Resp:  Normal effort  MSK:   Moves extremities without difficulty  Other:    Medical Decision Making  Medically screening exam initiated at 4:16 PM.  Appropriate orders placed.  Kristina [name] was informed that the remainder of the evaluation will be completed by another provider, this initial triage assessment does not replace that evaluation, and the importance of remaining in the ED until their evaluation is complete.  Patient Kristina [name] was rechecked and noted to be 99.6 F.   Kristina Bender, PA-C 02/06/21 1617    Drenda Freeze, MD 02/07/21 [phone]

## 2021-02-06 NOTE — ED Notes (Signed)
Pt decided to leave after being advised to continue to seek care.

## 2021-02-07 ENCOUNTER — Telehealth: Payer: Self-pay | Admitting: Unknown Physician Specialty

## 2021-02-07 DIAGNOSIS — M791 Myalgia, unspecified site: Secondary | ICD-10-CM | POA: Diagnosis not present

## 2021-02-07 DIAGNOSIS — U071 COVID-19: Secondary | ICD-10-CM | POA: Diagnosis not present

## 2021-02-07 DIAGNOSIS — R509 Fever, unspecified: Secondary | ICD-10-CM | POA: Diagnosis not present

## 2021-02-07 DIAGNOSIS — R079 Chest pain, unspecified: Secondary | ICD-10-CM | POA: Diagnosis not present

## 2021-02-07 DIAGNOSIS — R519 Headache, unspecified: Secondary | ICD-10-CM | POA: Diagnosis not present

## 2021-02-07 DIAGNOSIS — J029 Acute pharyngitis, unspecified: Secondary | ICD-10-CM | POA: Diagnosis not present

## 2021-02-07 MED ORDER — NIRMATRELVIR/RITONAVIR (PAXLOVID)TABLET: 3.0000 | ORAL_TABLET | Freq: Two times a day (BID) | ORAL | 0 refills | Status: AC

## 2021-02-07 NOTE — Telephone Encounter (Signed)
Outpatient Oral COVID Treatment Note  I connected with Loletha Grayer Lampert on 02/07/2021/3:58 PM by telephone and verified that I am speaking with the correct person using two identifiers.  I discussed the limitations, risks, security, and privacy concerns of performing an evaluation and management service by telephone and the availability of in person appointments. I also discussed with the patient that there may be a patient responsible charge related to this service. The patient expressed understanding and agreed to proceed.  Patient location: home Provider location: home  Diagnosis: COVID-19 infection  Purpose of visit: Discussion of potential use of Molnupiravir or Paxlovid, a new treatment for mild to moderate COVID-19 viral infection in non-hospitalized patients.   Subjective: Patient is a 50 y.o. female who has been diagnosed with COVID 19 viral infection.  Their symptoms began on 5/24 with cough, pain, fever, headache.    Past Medical History:  Diagnosis Date  . Allergic rhinitis   . Anxiety   . Aortic dilatation (HCC)   . Asthma   . Cervical cancer ([place])   . Chronic back pain   . Chronic cough   . Depression   . Diverticulosis   . Essential hypertension   . GERD (gastroesophageal reflux disease)   . History of bronchitis   . History of pneumonia   . Hx of adenomatous and sessile serrated colonic polyps   . Hyperlipidemia   . Hypothyroidism   . Lumbosacral spondylosis without myelopathy    Bulging disc - chronic pain  . Obesity    Bariatric surgery  . Obstructive sleep apnea   . PONV (postoperative nausea and vomiting)   . Restless leg   . Type 2 diabetes mellitus ([place])   . Vitamin D deficiency   . Vocal cord dysfunction     Allergies  Allergen Reactions  . Ampicillin Anaphylaxis    Has patient had a PCN reaction causing immediate rash, facial/tongue/throat swelling, SOB or lightheadedness with hypotension: Yes Has patient had a PCN reaction causing severe rash  involving mucus membranes or skin necrosis: Yes Has patient had a PCN reaction that required hospitalization Yes Has patient had a PCN reaction occurring within the last 10 years: No If all of the above answers are "NO", then may proceed with Cephalosporin use.   . Codeine Hives and Shortness Of Breath  . Sulfonamide Derivatives Itching and Rash     Current Outpatient Medications:  .  albuterol (VENTOLIN HFA) 108 (90 Base) MCG/ACT inhaler, Inhale 1-2 puffs into the lungs every 6 (six) hours as needed for wheezing or shortness of breath., Disp: , Rfl:  .  buPROPion (WELLBUTRIN SR) 200 MG 12 hr tablet, Take 200 mg by mouth 2 (two) times daily., Disp: , Rfl:  .  cyanocobalamin (,VITAMIN B-12,) 1000 MCG/ML injection, INJECT 1 ML (CC) ONCE A WEEK FOR 4 WEEKS THEN ONCE A MONTH (Patient not taking: Reported on 11/24/2020), Disp: , Rfl:  .  dexlansoprazole (DEXILANT) 60 MG capsule, Take 1 capsule (60 mg total) by mouth daily before breakfast., Disp: 90 capsule, Rfl: 3 .  levocetirizine (XYZAL) 5 MG tablet, Take 5 mg by mouth every evening., Disp: , Rfl:  .  levothyroxine (SYNTHROID) 112 MCG tablet, Take 112 mcg by mouth 2 (two) times daily., Disp: , Rfl:  .  REPATHA SURECLICK 440 MG/ML SOAJ, Inject 1 Syringe into the skin every 14 (fourteen) days. (Patient not taking: Reported on 11/24/2020), Disp: , Rfl:   Objective: Patient appears/sounds congested.  They are in no apparent distress.  Breathing is non labored.  Mood and behavior are normal.  Laboratory Data:  Recent Results (from the past 2160 hour(s))  Troponin I (High Sensitivity)     Status: None   Collection Time: 02/06/21  4:17 PM  Result Value Ref Range   Troponin I (High Sensitivity) <2 <18 ng/L    Comment: (NOTE) Elevated high sensitivity troponin I (hsTnI) values and significant  changes across serial measurements may suggest ACS but many other  chronic and acute conditions are known to elevate hsTnI results.  Refer to the "Links"  section for chest pain algorithms and additional  guidance. Performed at [facility], [place] [address], , [place] [address]   Comprehensive metabolic panel     Status: None   Collection Time: 02/06/21  4:17 PM  Result Value Ref Range   Sodium 138 135 - 145 mmol/L   Potassium 3.8 3.5 - 5.1 mmol/L   Chloride 106 98 - 111 mmol/L   CO2 24 22 - 32 mmol/L   Glucose, Bld 98 70 - 99 mg/dL    Comment: Glucose reference range applies only to samples taken after fasting for at least 8 hours.   BUN 6 6 - 20 mg/dL   Creatinine, Ser 0.80 0.44 - 1.00 mg/dL   Calcium 9.3 8.9 - 10.3 mg/dL   Total Protein 6.7 6.5 - 8.1 g/dL   Albumin 4.0 3.5 - 5.0 g/dL   AST 17 15 - 41 U/L   ALT 16 0 - 44 U/L   Alkaline Phosphatase 98 38 - 126 U/L   Total Bilirubin 0.6 0.3 - 1.2 mg/dL   GFR, Estimated >60 >60 mL/min    Comment: (NOTE) Calculated using the CKD-EPI Creatinine Equation (2021)    Anion gap 8 5 - 15    Comment: Performed at [facility] [address] [place], [place] [address]  CBC with Differential     Status: Abnormal   Collection Time: 02/06/21  4:17 PM  Result Value Ref Range   WBC 5.6 4.0 - 10.5 K/uL   RBC 4.69 3.87 - 5.11 MIL/uL   Hemoglobin 14.1 12.0 - 15.0 g/dL   HCT 42.1 36.0 - 46.0 %   MCV 89.8 80.0 - 100.0 fL   MCH 30.1 26.0 - 34.0 pg   MCHC 33.5 30.0 - 36.0 g/dL   RDW 12.4 11.5 - 15.5 %   Platelets 153 150 - 400 K/uL   nRBC 0.0 0.0 - 0.2 %   Neutrophils Relative % 81 %   Neutro Abs 4.5 1.7 - 7.7 K/uL   Lymphocytes Relative 6 %   Lymphs Abs 0.4 (L) 0.7 - 4.0 K/uL   Monocytes Relative 11 %   Monocytes Absolute 0.6 0.1 - 1.0 K/uL   Eosinophils Relative 1 %   Eosinophils Absolute 0.1 0.0 - 0.5 K/uL   Basophils Relative 1 %   Basophils Absolute 0.0 0.0 - 0.1 K/uL   Immature Granulocytes 0 %   Abs Immature Granulocytes 0.02 0.00 - 0.07 K/uL    Comment: Performed at [facility] [address] [place], [place] [address]  Resp Panel by RT-PCR (Flu A&B,  Covid) Nasopharyngeal Swab     Status: Abnormal   Collection Time: 02/06/21  4:23 PM   Specimen: Nasopharyngeal Swab; Nasopharyngeal(NP) swabs in vial transport medium  Result Value Ref Range   SARS Coronavirus 2 by RT PCR POSITIVE (A) NEGATIVE    Comment: RESULT CALLED TO, READ BACK BY AND VERIFIED  WITHBerta Minor RN 1497 02/06/21 A BROWNING (NOTE) SARS-CoV-2 target nucleic acids are DETECTED.  The SARS-CoV-2 RNA is generally detectable in upper respiratory specimens during the acute phase of infection. Positive results are indicative of the presence of the identified virus, but do not rule out bacterial infection or co-infection with other pathogens not detected by the test. Clinical correlation with patient history and other diagnostic information is necessary to determine patient infection status. The expected result is Negative.  Fact Sheet for Patients: EntrepreneurPulse.com.au  Fact Sheet for Healthcare Providers: IncredibleEmployment.be  This test is not yet approved or cleared by the [place] FDA and  has been authorized for detection and/or diagnosis of SARS-CoV-2 by FDA under an Emergency Use Authorization (EUA).  This EUA will remain in effect (meaning this test c an be used) for the duration of  the COVID-19 declaration under Section 564(b)(1) of the Act, 21 U.S.C. section 360bbb-3(b)(1), unless the authorization is terminated or revoked sooner.     Influenza A by PCR NEGATIVE NEGATIVE   Influenza B by PCR NEGATIVE NEGATIVE    Comment: (NOTE) The Xpert Xpress SARS-CoV-2/FLU/RSV plus assay is intended as an aid in the diagnosis of influenza from Nasopharyngeal swab specimens and should not be used as a sole basis for treatment. Nasal washings and aspirates are unacceptable for Xpert Xpress SARS-CoV-2/FLU/RSV testing.  Fact Sheet for Patients: EntrepreneurPulse.com.au  Fact Sheet for Healthcare  Providers: IncredibleEmployment.be  This test is not yet approved or cleared by the [place] FDA and has been authorized for detection and/or diagnosis of SARS-CoV-2 by FDA under an Emergency Use Authorization (EUA). This EUA will remain in effect (meaning this test can be used) for the duration of the COVID-19 declaration under Section 564(b)(1) of the Act, 21 U.S.C. section 360bbb-3(b)(1), unless the authorization is terminated or revoked.  Performed at [facility], [place] [address], [place], [place] [address]      Assessment: 50 y.o. female with mild/moderate COVID 19 viral infection diagnosed on 5/24 at high risk for progression to severe COVID 19.  Plan:  This patient is a 50 y.o. female that meets the following criteria for Emergency Use Authorization of: Paxlovid 1. Age >12 yr AND > 40 kg 2. SARS-COV-2 positive test 3. Symptom onset < 5 days 4. Mild-to-moderate COVID disease with high risk for severe progression to hospitalization or death  I have spoken and communicated the following to the patient or parent/caregiver regarding: 1. Paxlovid is an unapproved drug that is authorized for use under an Emergency Use Authorization.  2. There are no adequate, approved, available products for the treatment of COVID-19 in adults who have mild-to-moderate COVID-19 and are at high risk for progressing to severe COVID-19, including hospitalization or death. 3. Other therapeutics are currently authorized. For additional information on all products authorized for treatment or prevention of COVID-19, please see TanEmporium.pl.  4. There are benefits and risks of taking this treatment as outlined in the "Fact Sheet for Patients and Caregivers."  5. "Fact Sheet for Patients and Caregivers" was reviewed with patient. A hard copy will be provided to patient from  pharmacy prior to the patient receiving treatment. 6. Patients should continue to self-isolate and use infection control measures (e.g., wear mask, isolate, social distance, avoid sharing personal items, clean and disinfect "high touch" surfaces, and frequent handwashing) according to CDC guidelines.  7. The patient or parent/caregiver has the option to accept or refuse treatment. 8. Patient medication history was reviewed for potential  drug interactions:Buproprion - pt counseled that effectiveness of Buproprion may be decreased 9. Patient's GFR was calculated to be >60, and they were therefore prescribed Normal dose (GFR>60) - nirmatrelvir 150mg  tab (2 tablet) by mouth twice daily AND ritonavir 100mg  tab (1 tablet) by mouth twice daily   After reviewing above information with the patient, the patient agrees to receive Paxlovid.  Follow up instructions:    . Take prescription BID x 5 days as directed . Reach out to pharmacist for counseling on medication if desired . For concerns regarding further COVID symptoms please follow up with your PCP or urgent care . For urgent or life-threatening issues, seek care at your local emergency department  The patient was provided an opportunity to ask questions, and all were answered. The patient agreed with the plan and demonstrated an understanding of the instructions.   Script sent to [facility]  The patient was advised to call their PCP or seek an in-person evaluation if the symptoms worsen or if the condition fails to improve as anticipated.   I provided 20 minutes of non face-to-face telephone visit time during this encounter, and > 50% was spent counseling as documented under my assessment & plan.  Kathrine Haddock, NP 02/07/2021 /3:58 PM

## 2021-02-27 DIAGNOSIS — R0781 Pleurodynia: Secondary | ICD-10-CM | POA: Diagnosis not present

## 2021-02-27 DIAGNOSIS — U099 Post covid-19 condition, unspecified: Secondary | ICD-10-CM | POA: Diagnosis not present

## 2021-02-28 ENCOUNTER — Encounter: Payer: Medicare Other | Attending: General Surgery | Admitting: Skilled Nursing Facility1

## 2021-02-28 ENCOUNTER — Other Ambulatory Visit: Payer: Self-pay

## 2021-02-28 DIAGNOSIS — E1165 Type 2 diabetes mellitus with hyperglycemia: Secondary | ICD-10-CM | POA: Diagnosis not present

## 2021-02-28 NOTE — Progress Notes (Signed)
Supervised Weight Loss Visit Bariatric Nutrition Education  Planned Surgery: sleeve to RYGB  4 out of 6 SWL Appointments   Given sticker: no (will goive one next time)  NUTRITION ASSESSMENT  Anthropometrics  Start weight at [facility]: 214.7 lbs (date: 10/30/2020)  Weight: 217.9 pounds Height: 63 in BMI: 38.60 kg/m2     Clinical  Medical hx: GERD Medications: B 12, vitamin D Labs: B 12 973 Notable signs/symptoms:  Any previous deficiencies? anemia  Lifestyle & Dietary Hx   Pt states she had covid 3 weeks ago and still feels fatigued stating she thinks the steroids she took for treatment caused weight gain. Pt states she cut back on coffee and took the splenda out when she does drink coffee. Pt states lemon water is her new favorite.  Pt wonders how she counteract prednisone: Dietitian advised she do not act on the "hunger" feelings because it is just the prednisone.   Estimated daily fluid intake: aiming for 67 oz Supplements: taking calcium  Current average weekly physical activity: ADL's  24-Hr Dietary Recall First Meal: atkins bar or kodiak protein bar or blueberry muffin Snack: carrots + peanut butter or apple + peanut butter Second Meal: sandwich + chips Snack: balanced break or pretzels + cheese or lance crackers Third Meal: chicken + sweet potato Snack:  Beverages: 2 coffee + plant based creamer, water, unsweet tea, alkaline water; 7 splendas in a day  Estimated Energy Needs Calories: 1500  NUTRITION DIAGNOSIS  Overweight/obesity ([place]-3.3) related to past poor dietary habits and physical inactivity as evidenced by patient w/ planned sleeve to RYGB surgery following dietary guidelines for continued weight loss.   NUTRITION INTERVENTION  Nutrition counseling (C-1) and education (E-2) to facilitate bariatric surgery goals as well as diabetes control.   Pre-Op Goals Progress & New Goals Continued: Start taking the apropriate multivtimain and calcium Continued: Try  alkaline water to help reduce alternative sugars  Continued: Reduce the keto products  Continued: Chew foods until applesauce consistency continue: decrease splenda continue: increase water to 3 24 oz cups continue: walk 3 days a week for 30 minutes; walk in your building at work if it is going to rain later Continue: take the capsule multi now and save the chewable for  After surgery  continue: increase water NEW/continue: increase physical activity  NEW: make unsweet tea with minimum splenda  Handouts Provided Include    Learning Style & Readiness for Change Teaching method utilized: Visual & Auditory  Demonstrated degree of understanding via: Teach Back  Readiness Level: Action Barriers to learning/adherence to lifestyle change: unidentified   RD's Notes for next Visit  Assess pts adherence to chosen goals    MONITORING & EVALUATION Dietary intake, weekly physical activity, body weight, and pre-op goals in 1 month.   Next Steps  Patient is to return to [facility] for next SWL

## 2021-03-02 DIAGNOSIS — R0781 Pleurodynia: Secondary | ICD-10-CM | POA: Diagnosis not present

## 2021-03-06 DIAGNOSIS — R0989 Other specified symptoms and signs involving the circulatory and respiratory systems: Secondary | ICD-10-CM | POA: Diagnosis not present

## 2021-03-06 DIAGNOSIS — R059 Cough, unspecified: Secondary | ICD-10-CM | POA: Diagnosis not present

## 2021-03-06 DIAGNOSIS — R042 Hemoptysis: Secondary | ICD-10-CM | POA: Diagnosis not present

## 2021-03-06 DIAGNOSIS — J8410 Pulmonary fibrosis, unspecified: Secondary | ICD-10-CM | POA: Diagnosis not present

## 2021-03-06 DIAGNOSIS — J9811 Atelectasis: Secondary | ICD-10-CM | POA: Diagnosis not present

## 2021-03-06 DIAGNOSIS — R0602 Shortness of breath: Secondary | ICD-10-CM | POA: Diagnosis not present

## 2021-03-07 ENCOUNTER — Encounter: Payer: Self-pay | Admitting: Hematology and Oncology

## 2021-03-13 DIAGNOSIS — E119 Type 2 diabetes mellitus without complications: Secondary | ICD-10-CM | POA: Diagnosis not present

## 2021-03-13 DIAGNOSIS — E538 Deficiency of other specified B group vitamins: Secondary | ICD-10-CM | POA: Diagnosis not present

## 2021-03-13 DIAGNOSIS — D508 Other iron deficiency anemias: Secondary | ICD-10-CM | POA: Diagnosis not present

## 2021-03-13 DIAGNOSIS — J4520 Mild intermittent asthma, uncomplicated: Secondary | ICD-10-CM | POA: Diagnosis not present

## 2021-03-13 DIAGNOSIS — R002 Palpitations: Secondary | ICD-10-CM | POA: Diagnosis not present

## 2021-03-13 DIAGNOSIS — Z794 Long term (current) use of insulin: Secondary | ICD-10-CM | POA: Diagnosis not present

## 2021-03-14 ENCOUNTER — Telehealth: Payer: Self-pay | Admitting: Hematology and Oncology

## 2021-03-14 NOTE — Telephone Encounter (Signed)
Scheduled appt per 6/29 sch msg. Pt aware.  

## 2021-03-26 NOTE — Progress Notes (Signed)
Patient Care Team: Celene Squibb, MD as PCP - General (Internal Medicine) Satira Sark, MD as PCP - Cardiology (Cardiology)  DIAGNOSIS:    ICD-10-CM   1. Iron deficiency anemia, unspecified iron deficiency anemia type  D50.9       CHIEF COMPLIANT: Follow-up of [facility]  INTERVAL HISTORY: Kristina [name] is a 50 y.o. with above-mentioned history of iron deficiency anemia treated with IV iron (last 03/10/20). Labs on 02/06/21 showed Hg 14.1, HCT 42.1, MCV 89.8, PLT 153.  She was diagnosed with COVID-19 in May and ever since then she has had profound fatigue.  She had iron studies done by her primary care physician which showed an iron saturation of 18% with a TIBC of 362.  Ferritin was not obtained.  CBC was drawn this morning and I do not have that report.  Thyroid panel and lipid panels were also checked.  She wanted to come see [place] so that we can rule out iron deficiency as a cause of her profound fatigue.  ALLERGIES:  is allergic to ampicillin, codeine, sulfa antibiotics, and sulfonamide derivatives.  MEDICATIONS:  Current Outpatient Medications  Medication Sig Dispense Refill   albuterol (VENTOLIN HFA) 108 (90 Base) MCG/ACT inhaler Inhale 1-2 puffs into the lungs every 6 (six) hours as needed for wheezing or shortness of breath.     buPROPion (WELLBUTRIN SR) 200 MG 12 hr tablet Take 200 mg by mouth 2 (two) times daily.     cyanocobalamin (,VITAMIN B-12,) 1000 MCG/ML injection INJECT 1 ML (CC) ONCE A WEEK FOR 4 WEEKS THEN ONCE A MONTH (Patient not taking: Reported on 11/24/2020)     dexlansoprazole (DEXILANT) 60 MG capsule Take 1 capsule (60 mg total) by mouth daily before breakfast. 90 capsule 3   levocetirizine (XYZAL) 5 MG tablet Take 5 mg by mouth every evening.     levothyroxine (SYNTHROID) 112 MCG tablet Take 112 mcg by mouth 2 (two) times daily.     REPATHA SURECLICK 195 MG/ML SOAJ Inject 1 Syringe into the skin every 14 (fourteen) days. (Patient not taking: Reported on 11/24/2020)      No current facility-administered medications for this visit.    PHYSICAL EXAMINATION: ECOG PERFORMANCE STATUS: 1 - Symptomatic but completely ambulatory  Vitals:   03/27/21 1500  BP: 130/84  Pulse: 80  Resp: 18  Temp: 97.7 F (36.5 C)  SpO2: 97%   Filed Weights   03/27/21 1500  Weight: 220 lb 6.4 oz (100 kg)    LABORATORY DATA:  I have reviewed the data as listed CMP Latest Ref Rng & Units 02/06/2021 07/05/2020 05/17/2019  Glucose 70 - 99 mg/dL 98 120(H) 87  BUN 6 - 20 mg/dL 6 11 12   Creatinine 0.44 - 1.00 mg/dL 0.80 0.75 0.58  Sodium 135 - 145 mmol/L 138 138 141  Potassium 3.5 - 5.1 mmol/L 3.8 3.7 3.7  Chloride 98 - 111 mmol/L 106 105 110  CO2 22 - 32 mmol/L 24 25 23   Calcium 8.9 - 10.3 mg/dL 9.3 9.1 9.1  Total Protein 6.5 - 8.1 g/dL 6.7 - 6.7  Total Bilirubin 0.3 - 1.2 mg/dL 0.6 - 0.2(L)  Alkaline Phos 38 - 126 U/L 98 - 102  AST 15 - 41 U/L 17 - 13(L)  ALT 0 - 44 U/L 16 - 13    Lab Results  Component Value Date   WBC 5.6 02/06/2021   HGB 14.1 02/06/2021   HCT 42.1 02/06/2021   MCV 89.8 02/06/2021   PLT  153 02/06/2021   NEUTROABS 4.5 02/06/2021    ASSESSMENT & PLAN:  Iron deficiency anemia Lab review 05/24/2020: Hemoglobin 13.6 (on 05/17/2027 was 11.5), MCV 87.3, ferritin 161, iron saturation 36% Status post bariatric surgery (laparoscopic sleeve gastrectomy with hiatal hernia repair 09/19/2015)   IV iron: June 2021 (when the ferritin was at 9)   Lab review:   June 2022: Labs done at PCP: Iron saturation 18%, serum iron 66, TIBC 362, B12 1440 I recommended that we obtain ferritin levels. I will call her tomorrow with the results of today's lab work and she also has lab work pending from her PCP which include CBC and TSH levels.  We will review all of these results and decide if she needs any additional IV iron.  My initial review based on the labs provided by her did not support any iron deficiency.  Her symptoms may be post-COVID related fatigue.    Telephone visit tomorrow   No orders of the defined types were placed in this encounter.  The patient has a good understanding of the overall plan. she agrees with it. she will call with any problems that may develop before the next visit here.  Total time spent: 20 mins including face to face time and time spent for planning, charting and coordination of care  Rulon Eisenmenger, MD, MPH 03/27/2021  I, Thana Ates, am acting as scribe for Dr. Nicholas Lose.  I have reviewed the above documentation for accuracy and completeness, and I agree with the above.

## 2021-03-27 ENCOUNTER — Other Ambulatory Visit: Payer: Self-pay

## 2021-03-27 ENCOUNTER — Inpatient Hospital Stay: Payer: Medicare Other | Attending: Hematology and Oncology | Admitting: Hematology and Oncology

## 2021-03-27 ENCOUNTER — Inpatient Hospital Stay: Payer: Medicare Other

## 2021-03-27 DIAGNOSIS — Z79899 Other long term (current) drug therapy: Secondary | ICD-10-CM | POA: Diagnosis not present

## 2021-03-27 DIAGNOSIS — Z9884 Bariatric surgery status: Secondary | ICD-10-CM | POA: Insufficient documentation

## 2021-03-27 DIAGNOSIS — E1169 Type 2 diabetes mellitus with other specified complication: Secondary | ICD-10-CM | POA: Diagnosis not present

## 2021-03-27 DIAGNOSIS — Z0000 Encounter for general adult medical examination without abnormal findings: Secondary | ICD-10-CM | POA: Diagnosis not present

## 2021-03-27 DIAGNOSIS — R000 Tachycardia, unspecified: Secondary | ICD-10-CM | POA: Diagnosis not present

## 2021-03-27 DIAGNOSIS — Z8616 Personal history of COVID-19: Secondary | ICD-10-CM | POA: Insufficient documentation

## 2021-03-27 DIAGNOSIS — E782 Mixed hyperlipidemia: Secondary | ICD-10-CM | POA: Diagnosis not present

## 2021-03-27 DIAGNOSIS — D509 Iron deficiency anemia, unspecified: Secondary | ICD-10-CM | POA: Diagnosis not present

## 2021-03-27 DIAGNOSIS — Z1231 Encounter for screening mammogram for malignant neoplasm of breast: Secondary | ICD-10-CM | POA: Diagnosis not present

## 2021-03-27 DIAGNOSIS — E785 Hyperlipidemia, unspecified: Secondary | ICD-10-CM | POA: Diagnosis not present

## 2021-03-27 DIAGNOSIS — E039 Hypothyroidism, unspecified: Secondary | ICD-10-CM | POA: Diagnosis not present

## 2021-03-27 DIAGNOSIS — Z23 Encounter for immunization: Secondary | ICD-10-CM | POA: Diagnosis not present

## 2021-03-27 LAB — CBC WITH DIFFERENTIAL (CANCER CENTER ONLY)
Abs Immature Granulocytes: 0.01 10*3/uL (ref 0.00–0.07)
Basophils Absolute: 0 10*3/uL (ref 0.0–0.1)
Basophils Relative: 1 %
Eosinophils Absolute: 0.1 10*3/uL (ref 0.0–0.5)
Eosinophils Relative: 2 %
HCT: 38.9 % (ref 36.0–46.0)
Hemoglobin: 13.4 g/dL (ref 12.0–15.0)
Immature Granulocytes: 0 %
Lymphocytes Relative: 38 %
Lymphs Abs: 2.5 10*3/uL (ref 0.7–4.0)
MCH: 30 pg (ref 26.0–34.0)
MCHC: 34.4 g/dL (ref 30.0–36.0)
MCV: 87.2 fL (ref 80.0–100.0)
Monocytes Absolute: 0.6 10*3/uL (ref 0.1–1.0)
Monocytes Relative: 9 %
Neutro Abs: 3.5 10*3/uL (ref 1.7–7.7)
Neutrophils Relative %: 50 %
Platelet Count: 203 10*3/uL (ref 150–400)
RBC: 4.46 MIL/uL (ref 3.87–5.11)
RDW: 13 % (ref 11.5–15.5)
WBC Count: 6.7 10*3/uL (ref 4.0–10.5)
nRBC: 0 % (ref 0.0–0.2)

## 2021-03-27 LAB — VITAMIN B12: Vitamin B-12: 530 pg/mL (ref 180–914)

## 2021-03-27 NOTE — Assessment & Plan Note (Signed)
Lab review 05/24/2020: Hemoglobin 13.6 (on 05/17/2027 was 11.5), MCV 87.3, ferritin 161, iron saturation 36% Status post bariatric surgery (laparoscopic sleeve gastrectomy with hiatal hernia repair 09/19/2015)  IV iron: June 2021  Lab review: Hemoglobin 13.6, MCV 87.3, ferritin 96, iron saturation 97% F41 and folic acid were normal Based on these lab results she does not need intravenous iron therapy.  Return to clinic in 6 months with labs done ahead of time and follow-up

## 2021-03-28 ENCOUNTER — Encounter: Payer: Medicare Other | Attending: General Surgery | Admitting: Skilled Nursing Facility1

## 2021-03-28 DIAGNOSIS — E1165 Type 2 diabetes mellitus with hyperglycemia: Secondary | ICD-10-CM | POA: Diagnosis not present

## 2021-03-28 LAB — FERRITIN: Ferritin: 80 ng/mL (ref 11–307)

## 2021-03-28 LAB — IRON AND TIBC
Iron: 66 ug/dL (ref 41–142)
Saturation Ratios: 17 % — ABNORMAL LOW (ref 21–57)
TIBC: 386 ug/dL (ref 236–444)
UIBC: 321 ug/dL (ref 120–384)

## 2021-03-28 NOTE — Progress Notes (Signed)
Supervised Weight Loss Visit Bariatric Nutrition Education  Planned Surgery: sleeve to RYGB  5 out of 6 SWL Appointments   Given sticker: yes  NUTRITION ASSESSMENT  Anthropometrics  Start weight at [facility]: 214.7 lbs (date: 10/30/2020)  Weight: 219 pounds Height: 63 in BMI: 38.79 kg/m2     Clinical  Medical hx: GERD Medications: B 12, vitamin D Labs: B 12 530, A1C 5.5 Notable signs/symptoms:  Any previous deficiencies? anemia  Lifestyle & Dietary Hx  Pt states from covid she has heart palpitations and high bp. Pt states she tried to fight the prednisone hunger which was accomplished due to the increase by a nominal 2 pounds. Pt states she is feeling very fatigued and also vexed because she cannot workout due to her current heart issues.  Pt states she is actively working on increasing water and decreasing caffeine due to heart palpitations.   Estimated daily fluid intake: aiming for 67 oz Supplements: taking calcium  Current average weekly physical activity: ADL's  24-Hr Dietary Recall First Meal: english muffin Snack: berries Second Meal: cob salad Snack: cheese or pretzels   Third Meal: chicken + sweet potato Snack:  Beverages: 1 coffee + plant based creamer, water, unsweet tea, alkaline water; 4 splendas in a day  Estimated Energy Needs Calories: 1500  NUTRITION DIAGNOSIS  Overweight/obesity ([place]-3.3) related to past poor dietary habits and physical inactivity as evidenced by patient w/ planned sleeve to RYGB surgery following dietary guidelines for continued weight loss.   NUTRITION INTERVENTION  Nutrition counseling (C-1) and education (E-2) to facilitate bariatric surgery goals as well as diabetes control.   Pre-Op Goals Progress & New Goals Continued: Start taking the apropriate multivtimain and calcium Continued: Try alkaline water to help reduce alternative sugars  Continued: Reduce the keto products  Continued: Chew foods until applesauce  consistency continue: decrease splenda continue: increase water to 3 24 oz cups continue: walk 3 days a week for 30 minutes; walk in your building at work if it is going to rain later Continue: take the capsule multi now and save the chewable for  After surgery  continue: increase water NEW/continue: increase physical activity  (not able to do for health resons) Continue: make unsweet tea with minimum splenda NEW: increase water  Handouts Provided Include    Learning Style & Readiness for Change Teaching method utilized: Visual & Auditory  Demonstrated degree of understanding via: Teach Back  Readiness Level: Action Barriers to learning/adherence to lifestyle change: unidentified   RD's Notes for next Visit  Assess pts adherence to chosen goals    MONITORING & EVALUATION Dietary intake, weekly physical activity, body weight, and pre-op goals in 1 month.   Next Steps  Patient is to return to [facility] for next SWL

## 2021-03-29 ENCOUNTER — Inpatient Hospital Stay (HOSPITAL_BASED_OUTPATIENT_CLINIC_OR_DEPARTMENT_OTHER): Payer: Medicare Other | Admitting: Hematology and Oncology

## 2021-03-29 DIAGNOSIS — D509 Iron deficiency anemia, unspecified: Secondary | ICD-10-CM | POA: Diagnosis not present

## 2021-03-29 NOTE — Progress Notes (Signed)
HEMATOLOGY-ONCOLOGY TELEPHONE VISIT PROGRESS NOTE  I connected with @PTNAME @ on 03/29/21 at  2:15 PM EDT by telephone and verified that I am speaking with the correct person using two identifiers.  I discussed the limitations, risks, security and privacy concerns of performing an evaluation and management service by telephone and the availability of in person appointments.  I also discussed with the patient that there may be a patient responsible charge related to this service. The patient expressed understanding and agreed to proceed.   History of Present Illness: Telephone appointment to discuss results of blood work Ms. Parveen is 50 year old with above-mentioned history of chronic iron deficiency anemia due to bariatric surgery and malabsorption.  Her last IV iron infusion was in June 2021 when the ferritin was 9.  She was seen by [place] 2 days ago with complaints of fatigue postrecovery from [place].  We performed blood work with CBC and iron studies and we connected with her today via telephone to discuss results.   REVIEW OF SYSTEMS:   Continues to suffer from symptoms of fatigue   Assessment Plan:  History of iron deficiency anemia Lab review 05/24/2020: Hemoglobin 13.6 (on 05/17/2027 was 11.5), MCV 87.3, ferritin 161, iron saturation 36% Status post bariatric surgery (laparoscopic sleeve gastrectomy with hiatal hernia repair 09/19/2015) 03/28/2021: Hemoglobin 13.4, ferritin 80, iron saturation 17%  No role of IV iron at this time.  IV iron: June 2021 (when the ferritin was at 9) Return to clinic in 6 months with labs done ahead of time and follow-up with a telephone visit.  I discussed the assessment and treatment plan with the patient. The patient was provided an opportunity to ask questions and all were answered. The patient agreed with the plan and demonstrated an understanding of the instructions. The patient was advised to call back or seek an in-person evaluation if the symptoms worsen  or if the condition fails to improve as anticipated.   I provided 12 minutes of non-face-to-face time during this encounter. Harriette Ohara, MD

## 2021-04-02 ENCOUNTER — Encounter: Payer: Self-pay | Admitting: Hematology and Oncology

## 2021-04-03 ENCOUNTER — Telehealth: Payer: Self-pay | Admitting: Hematology and Oncology

## 2021-04-03 NOTE — Telephone Encounter (Signed)
Scheduled appointment per 07/14 los. Patient is aware. 

## 2021-04-13 ENCOUNTER — Other Ambulatory Visit: Payer: Self-pay | Admitting: Adult Health Nurse Practitioner

## 2021-04-13 ENCOUNTER — Ambulatory Visit (INDEPENDENT_AMBULATORY_CARE_PROVIDER_SITE_OTHER): Payer: Medicare Other

## 2021-04-13 ENCOUNTER — Other Ambulatory Visit: Payer: Self-pay

## 2021-04-13 ENCOUNTER — Telehealth: Payer: Self-pay

## 2021-04-13 DIAGNOSIS — R000 Tachycardia, unspecified: Secondary | ICD-10-CM

## 2021-04-13 NOTE — Telephone Encounter (Signed)
Fax request for zio 48 hr monitor for tachycardia for Kristina Latch, NP  Pt of Dr.Mcdowell     Enrolled in zio-minimum (time is 72 hrs by company)

## 2021-04-19 ENCOUNTER — Ambulatory Visit (INDEPENDENT_AMBULATORY_CARE_PROVIDER_SITE_OTHER): Payer: Medicare Other | Admitting: Psychology

## 2021-04-19 DIAGNOSIS — F509 Eating disorder, unspecified: Secondary | ICD-10-CM

## 2021-04-19 DIAGNOSIS — R000 Tachycardia, unspecified: Secondary | ICD-10-CM | POA: Diagnosis not present

## 2021-04-26 DIAGNOSIS — R000 Tachycardia, unspecified: Secondary | ICD-10-CM | POA: Diagnosis not present

## 2021-04-30 ENCOUNTER — Other Ambulatory Visit: Payer: Self-pay

## 2021-04-30 ENCOUNTER — Encounter: Payer: Medicare Other | Attending: General Surgery | Admitting: Skilled Nursing Facility1

## 2021-04-30 DIAGNOSIS — E119 Type 2 diabetes mellitus without complications: Secondary | ICD-10-CM | POA: Diagnosis not present

## 2021-04-30 NOTE — Progress Notes (Signed)
Supervised Weight Loss Visit Bariatric Nutrition Education  Planned Surgery: sleeve to RYGB  6 out of 6 SWL Appointments   Pt completed visits.   Pt has cleared nutrition requirements.    NUTRITION ASSESSMENT  Anthropometrics  Start weight at [facility]: 214.7 lbs (date: 10/30/2020)  Weight: 219.8 pounds Height: 63 in BMI: 38.94 kg/m2     Clinical  Medical hx: GERD Medications: B 12, vitamin D Labs: B 12 530, A1C 5.5 Notable signs/symptoms:  Any previous deficiencies? anemia  Lifestyle & Dietary Hx  Pt states her heart palpitations have gotten better since cutting back to one cup of tea and one cup of coffee. Pt states she has been working on chewing well which she feels has accomplished.   Pt states she feels she has accomplished cutting back on splenda, chewing well, increasing water and started taking her vitamins regularly.   Pt states she feel she may struggle with not making food her go to for stress/celebration but has grown in that behavior.   Pt states she feels talking ad reading and walking will be her go to for socialization and healthy relationship with food. Pt states with her son moving back into town he will be a good influence on her with eating healthy.   Estimated daily fluid intake: aiming for 67 oz Supplements: taking calcium  Current average weekly physical activity: ADL's  24-Hr Dietary Recall First Meal: english muffin Snack: berries or nuts or pretzels + cheese Second Meal: cob salad or [place] + ham wrap Snack: cheese or pretzels   Third Meal: chicken + sweet potato or taco salad Snack: skinny pop Beverages: 1 coffee + plant based creamer, water, unsweet tea, alkaline water; 4 splendas in a day  Estimated Energy Needs Calories: 1500  NUTRITION DIAGNOSIS  Overweight/obesity ([place]-3.3) related to past poor dietary habits and physical inactivity as evidenced by patient w/ planned sleeve to RYGB surgery following dietary guidelines for continued  weight loss.   NUTRITION INTERVENTION  Nutrition counseling (C-1) and education (E-2) to facilitate bariatric surgery goals as well as diabetes control.   Pre-Op Goals Progress & New Goals Continued: Start taking the apropriate multivtimain and calcium Continued: Try alkaline water to help reduce alternative sugars  Continued: Reduce the keto products  Continued: Chew foods until applesauce consistency continue: decrease splenda continue: increase water to 3 24 oz cups continue: walk 3 days a week for 30 minutes; walk in your building at work if it is going to rain later Continue: take the capsule multi now and save the chewable for  After surgery  continue: increase water continue: increase physical activity  (not able to do for health resons) Continue: make unsweet tea with minimum splenda continue: increase water  Handouts Provided Include    Learning Style & Readiness for Change Teaching method utilized: Visual & Auditory  Demonstrated degree of understanding via: Teach Back  Readiness Level: Action Barriers to learning/adherence to lifestyle change: unidentified   RD's Notes for next Visit  Assess pts adherence to chosen goals    MONITORING & EVALUATION Dietary intake, weekly physical activity, body weight, and pre-op goals  Next Steps  Patient is to return to [facility] for pre-op class Pt has completed visits. No further supervised visits required/recomended

## 2021-05-01 DIAGNOSIS — H1132 Conjunctival hemorrhage, left eye: Secondary | ICD-10-CM | POA: Diagnosis not present

## 2021-05-01 DIAGNOSIS — H538 Other visual disturbances: Secondary | ICD-10-CM | POA: Diagnosis not present

## 2021-05-01 DIAGNOSIS — H5712 Ocular pain, left eye: Secondary | ICD-10-CM | POA: Diagnosis not present

## 2021-05-02 ENCOUNTER — Ambulatory Visit (INDEPENDENT_AMBULATORY_CARE_PROVIDER_SITE_OTHER): Payer: Self-pay | Admitting: Psychology

## 2021-05-04 DIAGNOSIS — H1132 Conjunctival hemorrhage, left eye: Secondary | ICD-10-CM | POA: Diagnosis not present

## 2021-05-04 DIAGNOSIS — H02831 Dermatochalasis of right upper eyelid: Secondary | ICD-10-CM | POA: Diagnosis not present

## 2021-05-04 DIAGNOSIS — H35033 Hypertensive retinopathy, bilateral: Secondary | ICD-10-CM | POA: Diagnosis not present

## 2021-05-04 DIAGNOSIS — H25813 Combined forms of age-related cataract, bilateral: Secondary | ICD-10-CM | POA: Diagnosis not present

## 2021-05-04 DIAGNOSIS — H02834 Dermatochalasis of left upper eyelid: Secondary | ICD-10-CM | POA: Diagnosis not present

## 2021-05-11 DIAGNOSIS — Z9884 Bariatric surgery status: Secondary | ICD-10-CM | POA: Diagnosis not present

## 2021-05-11 DIAGNOSIS — E785 Hyperlipidemia, unspecified: Secondary | ICD-10-CM | POA: Diagnosis not present

## 2021-05-11 DIAGNOSIS — Z1321 Encounter for screening for nutritional disorder: Secondary | ICD-10-CM | POA: Diagnosis not present

## 2021-05-11 DIAGNOSIS — E079 Disorder of thyroid, unspecified: Secondary | ICD-10-CM | POA: Diagnosis not present

## 2021-05-11 DIAGNOSIS — E1169 Type 2 diabetes mellitus with other specified complication: Secondary | ICD-10-CM | POA: Diagnosis not present

## 2021-05-15 DIAGNOSIS — K529 Noninfective gastroenteritis and colitis, unspecified: Secondary | ICD-10-CM | POA: Diagnosis not present

## 2021-05-15 DIAGNOSIS — I493 Ventricular premature depolarization: Secondary | ICD-10-CM | POA: Diagnosis not present

## 2021-05-15 DIAGNOSIS — Z20822 Contact with and (suspected) exposure to covid-19: Secondary | ICD-10-CM | POA: Diagnosis not present

## 2021-06-05 DIAGNOSIS — U071 COVID-19: Secondary | ICD-10-CM | POA: Diagnosis not present

## 2021-06-06 DIAGNOSIS — Z20822 Contact with and (suspected) exposure to covid-19: Secondary | ICD-10-CM | POA: Diagnosis not present

## 2021-06-06 DIAGNOSIS — U071 COVID-19: Secondary | ICD-10-CM | POA: Diagnosis not present

## 2021-06-08 NOTE — Progress Notes (Signed)
Sent message, via epic in basket, requesting orders in epic from surgeon.  

## 2021-06-11 ENCOUNTER — Other Ambulatory Visit: Payer: Self-pay

## 2021-06-11 ENCOUNTER — Encounter: Payer: Medicare Other | Attending: General Surgery | Admitting: Skilled Nursing Facility1

## 2021-06-11 DIAGNOSIS — E1165 Type 2 diabetes mellitus with hyperglycemia: Secondary | ICD-10-CM | POA: Diagnosis not present

## 2021-06-12 NOTE — Progress Notes (Signed)
Pre-Operative Nutrition Class:    Patient was seen on 06/11/2021 for Pre-Operative Bariatric Surgery Education at [facility].    Surgery date: 06/26/2021 Surgery type: sleeve to RYGB Start weight at [facility]: 214.7 Weight today: 223 pounds  The following the learning objectives were met by the patient during this course: Identify Pre-Op Dietary Goals and will begin 2 weeks pre-operatively Identify appropriate sources of fluids and proteins  State protein recommendations and appropriate sources pre and post-operatively Identify Post-Operative Dietary Goals and will follow for 2 weeks post-operatively Identify appropriate multivitamin and calcium sources Describe the need for physical activity post-operatively and will follow MD recommendations State when to call healthcare provider regarding medication questions or post-operative complications When having a diagnosis of diabetes understanding hypoglycemia symptoms and the inclusion of 1 complex carbohydrate per meal  Handouts given during class include: Pre-Op Bariatric Surgery Diet Handout Protein Shake Handout Post-Op Bariatric Surgery Nutrition Handout BELT Program Information Flyer Support Group Information Flyer [facility] Bariatric Supplements Price List  Follow-Up Plan: Patient will follow-up at [facility] 2 weeks post operatively for diet advancement per MD.

## 2021-06-13 ENCOUNTER — Other Ambulatory Visit (HOSPITAL_COMMUNITY): Payer: Self-pay

## 2021-06-18 NOTE — Patient Instructions (Signed)
DUE TO COVID-19 ONLY ONE VISITOR IS ALLOWED TO COME WITH YOU AND STAY IN THE WAITING ROOM ONLY DURING PRE OP AND PROCEDURE.   **NO VISITORS ARE ALLOWED IN THE SHORT STAY AREA OR RECOVERY ROOM!!**  IF YOU WILL BE ADMITTED INTO THE HOSPITAL YOU ARE ALLOWED ONLY TWO SUPPORT PEOPLE DURING VISITATION HOURS ONLY (10AM -8PM)   The support person(s) may change daily. The support person(s) must pass our screening, gel in and out, and wear a mask at all times, including in the patient's room. Patients must also wear a mask when staff or their support person are in the room.  No visitors under the age of 69. Any visitor under the age of 28 must be accompanied by an adult.    COVID SWAB TESTING MUST BE COMPLETED ON: 06/22/21 **MUST PRESENT COMPLETED FORM AT TESTING SITE**    [place] (backside of the building) You are not required to quarantine, however you are required to wear a well-fitted mask when you are out and around people not in your household.  Hand Hygiene often Do NOT share personal items Notify your provider if you are in close contact with someone who has COVID or you develop fever 100.4 or greater, new onset of sneezing, cough, sore throat, shortness of breath or body aches.  [address] [place], [address], must go inside of the hospital, NOT A DRIVE THRU!  (Must self quarantine after testing. Follow instructions on handout.)       Your procedure is scheduled on:    Report to [facility] Main Entrance   Report to Short Stay at 5:15 AM   [facility])   Call this number if you have problems the morning of surgery [phone]   Do not eat food :After Midnight.   May have liquids until   : 4:30 AM day of surgery  CLEAR LIQUID DIET  Foods Allowed                                                                     Foods Excluded  Water, Black Coffee and tea, regular and decaf                              liquids that you cannot  Plain Jell-O in any flavor  (No red)                                           see through such as: Fruit ices (not with fruit pulp)                                     milk, soups, orange juice              Iced Popsicles (No red)  All solid food                                   Apple juices Sports drinks like Gatorade (No red) Lightly seasoned clear broth or consume(fat free) Sugar,   Sample Menu Breakfast                                Lunch                                     Supper Cranberry juice                    Beef broth                            Chicken broth Jell-O                                     Grape juice                           Apple juice Coffee or tea                        Jell-O                                      Popsicle                                                Coffee or tea                        Coffee or tea     Oral Hygiene is also important to reduce your risk of infection.                                    Remember - BRUSH YOUR TEETH THE MORNING OF SURGERY WITH YOUR REGULAR TOOTHPASTE   Do NOT smoke after Midnight   Take these medicines the morning of surgery with A SIP OF WATER: Wellbutrin,synthroid.Use inhalers as usual.  DO NOT TAKE ANY ORAL DIABETIC MEDICATIONS DAY OF YOUR SURGERY                              You may not have any metal on your body including hair pins, jewelry, and body piercing             Do not wear make-up, lotions, powders, perfumes/cologne, or deodorant  Do not wear nail polish including gel and S&S, artificial/acrylic nails, or any other type of covering on natural nails including finger and toenails. If you have artificial nails, gel coating, etc. that needs to be removed by a nail salon please have this removed prior  to surgery or surgery may need to be canceled/ delayed if the surgeon/ anesthesia feels like they are unable to be safely  monitored.   Do not shave  48 hours prior to surgery.    Do not bring valuables to the hospital. [place].   Contacts, dentures or bridgework may not be worn into surgery.   Bring small overnight bag day of surgery.    Patients discharged on the day of surgery will not be allowed to drive home.   Special Instructions: Bring a copy of your healthcare power of attorney and living will documents         the day of surgery if you haven't scanned them before.              Please read over the following fact sheets you were given: IF YOU HAVE QUESTIONS ABOUT YOUR PRE-OP INSTRUCTIONS PLEASE CALL [phone]   [facility] - Preparing for Surgery Before surgery, you can play an important role.  Because skin is not sterile, your skin needs to be as free of germs as possible.  You can reduce the number of germs on your skin by washing with CHG (chlorahexidine gluconate) soap before surgery.  CHG is an antiseptic cleaner which kills germs and bonds with the skin to continue killing germs even after washing. Please DO NOT use if you have an allergy to CHG or antibacterial soaps.  If your skin becomes reddened/irritated stop using the CHG and inform your nurse when you arrive at Short Stay. Do not shave (including legs and underarms) for at least 48 hours prior to the first CHG shower.  You may shave your face/neck. Please follow these instructions carefully:  1.  Shower with CHG Soap the night before surgery and the  morning of Surgery.  2.  If you choose to wash your hair, wash your hair first as usual with your  normal  shampoo.  3.  After you shampoo, rinse your hair and body thoroughly to remove the  shampoo.                           4.  Use CHG as you would any other liquid soap.  You can apply chg directly  to the skin and wash                       Gently with a scrungie or clean washcloth.  5.  Apply the CHG Soap to your body ONLY FROM THE NECK  DOWN.   Do not use on face/ open                           Wound or open sores. Avoid contact with eyes, ears mouth and genitals (private parts).                       Wash face,  Genitals (private parts) with your normal soap.             6.  Wash thoroughly, paying special attention to the area where your surgery  will be performed.  7.  Thoroughly rinse your body with warm water from the neck down.  8.  DO NOT shower/wash with your normal soap after using and rinsing off  the  CHG Soap.                9.  Pat yourself dry with a clean towel.            10.  Wear clean pajamas.            11.  Place clean sheets on your bed the night of your first shower and do not  sleep with pets. Day of Surgery : Do not apply any lotions/deodorants the morning of surgery.  Please wear clean clothes to the hospital/surgery center.  FAILURE TO FOLLOW THESE INSTRUCTIONS MAY RESULT IN THE CANCELLATION OF YOUR SURGERY PATIENT SIGNATURE_________________________________  NURSE SIGNATURE__________________________________  ________________________________________________________________________

## 2021-06-19 ENCOUNTER — Other Ambulatory Visit: Payer: Self-pay

## 2021-06-19 ENCOUNTER — Encounter (HOSPITAL_COMMUNITY): Payer: Medicare Other

## 2021-06-19 ENCOUNTER — Encounter (HOSPITAL_COMMUNITY)
Admission: RE | Admit: 2021-06-19 | Discharge: 2021-06-19 | Disposition: A | Discharge status: Home / Self Care | Payer: Medicare Other | Source: Ambulatory Visit | Attending: General Surgery | Admitting: General Surgery

## 2021-06-19 ENCOUNTER — Encounter (HOSPITAL_COMMUNITY): Payer: Self-pay

## 2021-06-19 DIAGNOSIS — Z01812 Encounter for preprocedural laboratory examination: Secondary | ICD-10-CM | POA: Insufficient documentation

## 2021-06-19 DIAGNOSIS — R7303 Prediabetes: Secondary | ICD-10-CM | POA: Insufficient documentation

## 2021-06-19 HISTORY — DX: Tachycardia, unspecified: R00.0

## 2021-06-19 LAB — BASIC METABOLIC PANEL
Anion gap: 8 (ref 5–15)
BUN: 12 mg/dL (ref 6–20)
CO2: 27 mmol/L (ref 22–32)
Calcium: 9.6 mg/dL (ref 8.9–10.3)
Chloride: 110 mmol/L (ref 98–111)
Creatinine, Ser: 0.66 mg/dL (ref 0.44–1.00)
GFR, Estimated: >60 mL/min (ref >60)
Glucose, Bld: 91 mg/dL (ref 70–99)
Potassium: 3.8 mmol/L (ref 3.5–5.1)
Sodium: 145 mmol/L (ref 135–145)

## 2021-06-19 LAB — HEMOGLOBIN A1C
Hgb A1c MFr Bld: 5.3 % (ref 4.8–5.6)
Mean Plasma Glucose: 105.41 mg/dL

## 2021-06-19 LAB — CBC
HCT: 41.4 % (ref 36.0–46.0)
Hemoglobin: 13.7 g/dL (ref 12.0–15.0)
MCH: 30.1 pg (ref 26.0–34.0)
MCHC: 33.1 g/dL (ref 30.0–36.0)
MCV: 91 fL (ref 80.0–100.0)
Platelets: 188 10*3/uL (ref 150–400)
RBC: 4.55 MIL/uL (ref 3.87–5.11)
RDW: 12.7 % (ref 11.5–15.5)
WBC: 6.5 10*3/uL (ref 4.0–10.5)
nRBC: 0 % (ref 0.0–0.2)

## 2021-06-19 LAB — GLUCOSE, CAPILLARY: Glucose-Capillary: 95 mg/dL (ref 70–99)

## 2021-06-19 NOTE — Progress Notes (Addendum)
COVID Vaccine Completed: Yes Date COVID Vaccine completed: 01/06/20 COVID vaccine manufacturer: Moderna  x 2 COVID Test: 06/22/21  PCP - Dr. Celene Squibb Cardiologist - Dr. Rozann Lesches  Chest x-ray -  EKG - 02/07/21 Stress Test -  ECHO - 07/14/20 Cardiac Cath -  Pacemaker/ICD device last checked:  Sleep Study - Yes CPAP - NO  Fasting Blood Sugar - N/A Checks Blood Sugar __0___ times a day  Blood Thinner Instructions: Aspirin Instructions: Last Dose:  Anesthesia review: HTN,DIA,OSA(NO CPAP),Tachycardia.  Patient denies shortness of breath, fever, cough and chest pain at PAT appointment   Patient verbalized understanding of instructions that were given to them at the PAT appointment. Patient was also instructed that they will need to review over the PAT instructions again at home before surgery.

## 2021-06-22 ENCOUNTER — Ambulatory Visit: Payer: Self-pay | Admitting: General Surgery

## 2021-06-22 DIAGNOSIS — K449 Diaphragmatic hernia without obstruction or gangrene: Secondary | ICD-10-CM | POA: Diagnosis not present

## 2021-06-22 DIAGNOSIS — Z9884 Bariatric surgery status: Secondary | ICD-10-CM | POA: Diagnosis not present

## 2021-06-22 DIAGNOSIS — E039 Hypothyroidism, unspecified: Secondary | ICD-10-CM | POA: Diagnosis not present

## 2021-06-22 DIAGNOSIS — Z01818 Encounter for other preprocedural examination: Secondary | ICD-10-CM

## 2021-06-22 DIAGNOSIS — K219 Gastro-esophageal reflux disease without esophagitis: Secondary | ICD-10-CM | POA: Diagnosis not present

## 2021-06-22 DIAGNOSIS — J302 Other seasonal allergic rhinitis: Secondary | ICD-10-CM | POA: Diagnosis not present

## 2021-06-25 MED ORDER — GENTAMICIN SULFATE 40 MG/ML IJ SOLN
5.0000 mg/kg | INTRAVENOUS | Status: AC
  Administered 2021-06-26: 360 mg via INTRAVENOUS
  Filled 2021-06-25: qty 9

## 2021-06-25 NOTE — H&P (Signed)
PROVIDER: Marielys Trinidad Leanne Chang, MD  MRN: [id number] DOB: 05/21/1971 DATE OF ENCOUNTER: 06/22/2021 Subjective   Chief Complaint: Discuss surgery   History of Present Illness: Kristina [name] is a 50 y.o. female who is seen today for long-term follow-up regarding her obesity and reflux after undergoing laparoscopic sleeve gastrectomy with hiatal hernia repair (1 stitch repair) on September 19, 2015. Her initial visit weight was 314 pounds. Her preoperative weight was 310 pounds. Her last visit in July 2021 her weight was 202 pounds with a BMI of 36. She had a been enrolled in working with the obesity medicine program with [facility] health. Unfortunately she developed severe reflux after her sleeve gastrectomy which has been progressive. She has been working with Dr. Carlean Purl. She was having aspiration events at night. She would wake up with acid in her nose. She underwent upper endoscopy in June 2021 which showed a normal esophagus however she had a 5 cm hiatal hernia with a normal-appearing sleeve anatomy. She is on maximal PPI therapy and is still symptomatic. Her symptoms are unchanged and GI was recommending revisional surgery. At that time when I saw her last summer we discussed conversion to Roux-en-Y gastric bypass along with hiatal hernia repair. She has completed the revisional surgery pathway.  She had undergone a panniculectomy in March 2020.  She tested positive for COVID on June 05, 2021. She was given antiviral treatment but no steroids. Had a case of gastroenteritis prompting a visit to her PCP on August 30. She has had regular follow-up with the hematologist for her anemia-iron deficiency but did not receive or need iron in July.  She is accompanied by her daughter. She states she is doing well. She states that she is recovered from her COVID. That was actually the second episode of COVID she had had. She denies any cough. She denies any fever, chills, malaise. She is still having ongoing  issues with reflux. Her sleep apnea has resolved. She is talked with her PCP about converting her Wellbutrin from extended release to immediate release because of her upcoming gastric bypass. She is off her blood pressure medications. She is taking her bariatric multivitamin and her Synthroid.  Review of Systems: A complete review of systems was obtained from the patient. I have reviewed this information and discussed as appropriate with the patient. See HPI as well for other ROS.  ROS   Medical History: Past Medical History:  Diagnosis Date   Allergic rhinitis due to allergen   Asthma without status asthmaticus, unspecified   Diabetes mellitus type 2, uncomplicated (CMS-HCC)   GERD (gastroesophageal reflux disease)   Hypertension   Obesity   Pneumonia   Sinusitis, unspecified   Sleep apnea   Patient Active Problem List  Diagnosis   OSA on CPAP   Asthma   Back pain, chronic   Diabetes (CMS-HCC)   Hypertension   Depression   Anxiety   GERD (gastroesophageal reflux disease)   Hyperlipidemia   Hypothyroid, unspecified   Seasonal allergies   Past Surgical History:  Procedure Laterality Date   CHOLECYSTECTOMY 1994   FUNCTIONAL ENDOSCOPIC SINUS SURGERY 2004   HYSTERECTOMY VAGINAL 1996   knee surgery 2011    Allergies  Allergen Reactions   Ampicillin Anaphylaxis   Codeine Anaphylaxis   Sulfa (Sulfonamide Antibiotics) Hives   Current Outpatient Medications on File Prior to Visit  Medication Sig Dispense Refill   buPROPion (WELLBUTRIN SR) 200 MG SR tablet bupropion HCl SR 200 mg tablet,12 hr sustained-release   dexlansoprazole (DEXILANT)  60 mg DR capsule Dexilant 60 mg capsule, delayed release   levocetirizine (XYZAL) 5 MG tablet Take by mouth   albuterol (VENTOLIN HFA;PROAIR HFA) 90 mcg/actuation inhaler Inhale 2 inhalations into the lungs every 6 (six) hours as needed.   ALPRAZolam (XANAX) 0.25 MG tablet Take 0.25 mg by mouth daily as needed.   amitriptyline (ELAVIL)  10 MG tablet Take 1 to 6 pills qhs. Start 1 pill qhs and may increase dose by 1 pill per week if still coughing. 150 tablet 11   bisoprolol (ZEBETA) 5 MG tablet Take 5 mg by mouth daily.   dextromethorphan poly complex (DELSYM) 30 mg/5 mL liquid Take 30 mg by mouth 4 (four) times daily as needed.   diazepam (VALIUM) 5 MG tablet Take 5 mg by mouth nightly as needed.   famotidine (PEPCID) 20 MG tablet Take 20 mg by mouth nightly.   FLUoxetine (PROZAC) 10 MG capsule Take 10 mg by mouth daily.   HYDROcodone-acetaminophen (NORCO) 7.5-325 mg tablet Take 1 tablet by mouth every 4 (four) hours as needed.   levothyroxine (SYNTHROID, LEVOTHROID) 50 MCG tablet Take 1 tablet (50 mcg total) by mouth daily. Take on an empty stomach with a glass of water at least 30-60 minutes before breakfast. 30 tablet 12   methocarbamol (ROBAXIN) 500 MG tablet Take 500 mg by mouth 3 (three) times daily.   olmesartan-hydrochlorothiazide (BENICAR HCT) 40-25 mg tablet Take 1 tablet by mouth daily.   ondansetron (ZOFRAN) 4 MG tablet Take 4 mg by mouth every 8 (eight) hours as needed.   pantoprazole (PROTONIX) 40 MG DR tablet Take 40 mg by mouth daily.   promethazine (PHENERGAN) 25 MG tablet Take 25 mg by mouth every 6 (six) hours as needed.   traMADol (ULTRAM) 50 mg tablet Take 50 mg by mouth every 6 (six) hours as needed.   No current facility-administered medications on file prior to visit.   Family History  Problem Relation Age of Onset   Emphysema Mother   Coronary Artery Disease (Blocked arteries around heart) Father   Lung disease Sister   Sudden death (unexpected death due to unknown cause) Sister   Heart disease Brother   COPD Brother   Sleep apnea Brother   Graves' disease Sister    Social History   Tobacco Use  Smoking Status Passive Smoke Exposure - Never Smoker  Smokeless Tobacco Never Used    Social History   Socioeconomic History   Marital status: Divorced  Tobacco Use   Smoking status: Passive  Smoke Exposure - Never Smoker   Smokeless tobacco: Never Used  Substance and Sexual Activity   Alcohol use: No   Drug use: No  Social History Narrative  Smoked 1-2 cigarettes in her life. Exposed to second hand smoking. 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 Home built in [place]. Some mold in her bathroom outside of her bedroom. No hot tubs or humidifiers. No pets or birds. Her daughter is a Camera operator and lives with her (pet dander exposure).   Objective:   Vitals:  06/22/21 0907  BP: 120/88  Pulse: 89  Temp: 36.7 C (98 F)  SpO2: 97%  Weight: (!) 101.4 kg (223 lb 9.6 oz)  Height: 160 cm (5\' 3" )   Body mass index is 39.61 kg/m.  Gen: alert, NAD, non-toxic appearing Pupils: equal, no scleral icterus Pulm: Lungs clear to auscultation, symmetric chest rise CV: regular rate and rhythm Abd: soft, nontender, nondistended. old trocar sites. No cellulitis.  No incisional hernia; Pfannenstiel incision Ext: no edema,  Skin: no rash, no jaundice  Labs, Imaging and Diagnostic Testing:  She underwent an upper GI which showed a hiatal hernia but it did show mild to moderate reflux consistent with her symptoms. She also had a CT angio of her chest in October 2020 which also showed a small sliding hiatal hernia and my opinion.  She had labs on 06/19/2021 which showed a normal hemoglobin A1c, normal basic metabolic panel, normal CBC. She had a vitamin B12, ferritin, and iron levels in July which were also normal. She had normal LFTs in May.  She had an echocardiogram in October 2021 which showed an LV ejection fraction of 60 to 65%, normal function, no regional wall motion abnormalities, right ventricular systolic function normal, mild dilation of the aortic root. Assessment and Plan:  Diagnoses and all orders for this visit:  Class 2 obesity with body mass index (BMI) of 39.0 to 39.9 in adult, unspecified obesity type, unspecified whether serious comorbidity  present  Gastroesophageal reflux disease without esophagitis  Seasonal allergies  Acquired hypothyroidism, unspecified  Hiatal hernia  S/P laparoscopic sleeve gastrectomy 09/19/15 Comments: preop weight 310lbs    Overall she is done quite well from her laparoscopic sleeve gastrectomy with respect to her severe obesity. She has lost 86 pounds since surgery with a total weight change of 28%. Unfortunately she developed refractory GERD that has not been responsive to medical therapy. Therefore she has completed the bariatric revisional surgery evaluation and has received nutritional and psychological clearance. There is evidence of a recurrent hiatal hernia however the size differs from her endoscopy versus CT and upper GI imaging. Nonetheless I believe conversion to a Roux-en-Y gastric bypass would be the best option to resolve her reflux.  At her prior appointment we had discussed the steps of the procedure in detail along with the risk and benefits. Today we briefly recap the steps of the procedure. She has had her preoperative education class. We discussed the typical hospitalization and the typical recovery. We discussed the typical quirks that we see after surgery. We discussed the diet progression after surgery. We discussed the importance of the preoperative meal plan. I offered to go over risk and benefits in detail but she declined. All of her questions were asked and answered. She signed the surgical consent form today  This patient encounter took 28 minutes today to perform the following: take history, perform exam, review outside records, interpret imaging, counsel the patient on their diagnosis and document encounter, findings & plan in the EHR  No follow-ups on file.  Leighton Ruff. Raziyah Vanvleck MD FACS General, Minimally Invasive, & Bariatric Surgery

## 2021-06-25 NOTE — Anesthesia Preprocedure Evaluation (Addendum)
Anesthesia Evaluation  Patient identified by MRN, date of birth, ID band Patient awake    Reviewed: Allergy & Precautions, NPO status , Patient's Chart, lab work & pertinent test results  History of Anesthesia Complications (+) PONV  Airway Mallampati: I  TM Distance: >3 FB Neck ROM: Full    Dental  (+) Chipped, Dental Advisory Given, Caps   Pulmonary asthma , sleep apnea (no longer requires CPAP) , COPD,  COPD inhaler,    breath sounds clear to auscultation       Cardiovascular hypertension (no longer on BP meds), (-) angina Rhythm:Regular Rate:Normal  '21 ECHO: EF 60-65%. The LV has normal function, no regional wall motion abnormalities, no significant valvular abnormalities   Neuro/Psych  Headaches, Anxiety Depression    GI/Hepatic Neg liver ROS, GERD  Controlled,  Endo/Other  diabetes (glu 80)Hypothyroidism Morbid obesity  Renal/GU negative Renal ROS     Musculoskeletal  (+) Arthritis ,   Abdominal (+) + obese,   Peds  Hematology negative hematology ROS (+)   Anesthesia Other Findings   Reproductive/Obstetrics                           Anesthesia Physical Anesthesia Plan  ASA: 3  Anesthesia Plan: General   Post-op Pain Management:    Induction: Intravenous  PONV Risk Score and Plan: 3 and Ondansetron and Dexamethasone  Airway Management Planned: Oral ETT  Additional Equipment: None  Intra-op Plan:   Post-operative Plan: Extubation in OR  Informed Consent: I have reviewed the patients History and Physical, chart, labs and discussed the procedure including the risks, benefits and alternatives for the proposed anesthesia with the patient or authorized representative who has indicated his/her understanding and acceptance.     Dental advisory given  Plan Discussed with: CRNA and Surgeon  Anesthesia Plan Comments:        Anesthesia Quick Evaluation

## 2021-06-26 ENCOUNTER — Encounter (HOSPITAL_COMMUNITY): Payer: Self-pay | Admitting: General Surgery

## 2021-06-26 ENCOUNTER — Other Ambulatory Visit: Payer: Self-pay

## 2021-06-26 ENCOUNTER — Inpatient Hospital Stay (HOSPITAL_COMMUNITY)
Admission: RE | Admit: 2021-06-26 | Discharge: 2021-06-27 | DRG: 328 | Disposition: A | Discharge status: Home / Self Care | Payer: Medicare Other | Attending: General Surgery | Admitting: General Surgery

## 2021-06-26 ENCOUNTER — Inpatient Hospital Stay (HOSPITAL_COMMUNITY): Payer: Medicare Other | Admitting: Anesthesiology

## 2021-06-26 ENCOUNTER — Encounter (HOSPITAL_COMMUNITY)
Admission: RE | Disposition: A | Discharge status: Home / Self Care | Payer: Self-pay | Source: Home / Self Care | Attending: General Surgery

## 2021-06-26 DIAGNOSIS — F32A Depression, unspecified: Secondary | ICD-10-CM | POA: Diagnosis not present

## 2021-06-26 DIAGNOSIS — Z9049 Acquired absence of other specified parts of digestive tract: Secondary | ICD-10-CM

## 2021-06-26 DIAGNOSIS — Z79899 Other long term (current) drug therapy: Secondary | ICD-10-CM

## 2021-06-26 DIAGNOSIS — Z6839 Body mass index (BMI) 39.0-39.9, adult: Secondary | ICD-10-CM | POA: Diagnosis not present

## 2021-06-26 DIAGNOSIS — Z7722 Contact with and (suspected) exposure to environmental tobacco smoke (acute) (chronic): Secondary | ICD-10-CM | POA: Diagnosis present

## 2021-06-26 DIAGNOSIS — F419 Anxiety disorder, unspecified: Secondary | ICD-10-CM | POA: Diagnosis not present

## 2021-06-26 DIAGNOSIS — Z825 Family history of asthma and other chronic lower respiratory diseases: Secondary | ICD-10-CM

## 2021-06-26 DIAGNOSIS — E119 Type 2 diabetes mellitus without complications: Secondary | ICD-10-CM | POA: Diagnosis not present

## 2021-06-26 DIAGNOSIS — Z01818 Encounter for other preprocedural examination: Secondary | ICD-10-CM

## 2021-06-26 DIAGNOSIS — J45909 Unspecified asthma, uncomplicated: Secondary | ICD-10-CM | POA: Diagnosis not present

## 2021-06-26 DIAGNOSIS — E669 Obesity, unspecified: Secondary | ICD-10-CM | POA: Diagnosis not present

## 2021-06-26 DIAGNOSIS — K660 Peritoneal adhesions (postprocedural) (postinfection): Secondary | ICD-10-CM | POA: Diagnosis not present

## 2021-06-26 DIAGNOSIS — Z885 Allergy status to narcotic agent status: Secondary | ICD-10-CM | POA: Diagnosis not present

## 2021-06-26 DIAGNOSIS — Z90710 Acquired absence of both cervix and uterus: Secondary | ICD-10-CM

## 2021-06-26 DIAGNOSIS — Z8249 Family history of ischemic heart disease and other diseases of the circulatory system: Secondary | ICD-10-CM

## 2021-06-26 DIAGNOSIS — Z8616 Personal history of COVID-19: Secondary | ICD-10-CM | POA: Diagnosis not present

## 2021-06-26 DIAGNOSIS — Z882 Allergy status to sulfonamides status: Secondary | ICD-10-CM

## 2021-06-26 DIAGNOSIS — K449 Diaphragmatic hernia without obstruction or gangrene: Principal | ICD-10-CM | POA: Diagnosis present

## 2021-06-26 DIAGNOSIS — E785 Hyperlipidemia, unspecified: Secondary | ICD-10-CM | POA: Diagnosis not present

## 2021-06-26 DIAGNOSIS — Z9884 Bariatric surgery status: Secondary | ICD-10-CM | POA: Diagnosis not present

## 2021-06-26 DIAGNOSIS — G4733 Obstructive sleep apnea (adult) (pediatric): Secondary | ICD-10-CM | POA: Diagnosis not present

## 2021-06-26 DIAGNOSIS — K219 Gastro-esophageal reflux disease without esophagitis: Secondary | ICD-10-CM | POA: Diagnosis present

## 2021-06-26 DIAGNOSIS — E039 Hypothyroidism, unspecified: Secondary | ICD-10-CM | POA: Diagnosis not present

## 2021-06-26 DIAGNOSIS — M549 Dorsalgia, unspecified: Secondary | ICD-10-CM | POA: Diagnosis present

## 2021-06-26 DIAGNOSIS — G8929 Other chronic pain: Secondary | ICD-10-CM | POA: Diagnosis present

## 2021-06-26 DIAGNOSIS — I10 Essential (primary) hypertension: Secondary | ICD-10-CM | POA: Diagnosis present

## 2021-06-26 DIAGNOSIS — Z79890 Hormone replacement therapy (postmenopausal): Secondary | ICD-10-CM

## 2021-06-26 DIAGNOSIS — J439 Emphysema, unspecified: Secondary | ICD-10-CM | POA: Diagnosis not present

## 2021-06-26 DIAGNOSIS — Z881 Allergy status to other antibiotic agents status: Secondary | ICD-10-CM | POA: Diagnosis not present

## 2021-06-26 HISTORY — PX: GASTRIC ROUX-EN-Y: SHX5262

## 2021-06-26 HISTORY — PX: HIATAL HERNIA REPAIR: SHX195

## 2021-06-26 LAB — TYPE AND SCREEN
ABO/RH(D): A POS
Antibody Screen: NEGATIVE

## 2021-06-26 LAB — ABO/RH: ABO/RH(D): A POS

## 2021-06-26 LAB — HEMOGLOBIN AND HEMATOCRIT, BLOOD
HCT: 39.4 % (ref 36.0–46.0)
Hemoglobin: 13.1 g/dL (ref 12.0–15.0)

## 2021-06-26 LAB — GLUCOSE, CAPILLARY
Glucose-Capillary: 80 mg/dL (ref 70–99)
Glucose-Capillary: 107 mg/dL — ABNORMAL HIGH (ref 70–99)

## 2021-06-26 SURGERY — LAPAROSCOPIC ROUX-EN-Y GASTRIC BYPASS WITH UPPER ENDOSCOPY
Anesthesia: General | Site: Abdomen

## 2021-06-26 MED ORDER — ROCURONIUM BROMIDE 10 MG/ML (PF) SYRINGE
PREFILLED_SYRINGE | INTRAVENOUS | Status: DC | PRN
  Administered 2021-06-26: 10 mg via INTRAVENOUS
  Administered 2021-06-26: 70 mg via INTRAVENOUS
  Administered 2021-06-26: 10 mg via INTRAVENOUS
  Administered 2021-06-26: 30 mg via INTRAVENOUS

## 2021-06-26 MED ORDER — ALBUTEROL SULFATE (2.5 MG/3ML) 0.083% IN NEBU: 2.5000 mg | INHALATION_SOLUTION | Freq: Four times a day (QID) | RESPIRATORY_TRACT | Status: DC | PRN

## 2021-06-26 MED ORDER — LIDOCAINE 2% (20 MG/ML) 5 ML SYRINGE
INTRAMUSCULAR | Status: DC | PRN
  Administered 2021-06-26: 1.5 mg/kg/h via INTRAVENOUS

## 2021-06-26 MED ORDER — MIDAZOLAM HCL 2 MG/2ML IJ SOLN: 0.5000 mg | Freq: Once | INTRAMUSCULAR | Status: DC | PRN

## 2021-06-26 MED ORDER — DEXAMETHASONE SODIUM PHOSPHATE 10 MG/ML IJ SOLN
INTRAMUSCULAR | Status: AC
  Filled 2021-06-26: qty 1

## 2021-06-26 MED ORDER — 0.9 % SODIUM CHLORIDE (POUR BTL) OPTIME
TOPICAL | Status: DC | PRN
  Administered 2021-06-26: 1000 mL

## 2021-06-26 MED ORDER — GABAPENTIN 300 MG PO CAPS
300.0000 mg | ORAL_CAPSULE | ORAL | Status: AC
  Administered 2021-06-26: 300 mg via ORAL
  Filled 2021-06-26: qty 1

## 2021-06-26 MED ORDER — SODIUM CHLORIDE (PF) 0.9 % IJ SOLN
INTRAMUSCULAR | Status: DC | PRN
  Administered 2021-06-26: 50 mL

## 2021-06-26 MED ORDER — PROPOFOL 500 MG/50ML IV EMUL
INTRAVENOUS | Status: DC | PRN
  Administered 2021-06-26: 25 ug/kg/min via INTRAVENOUS

## 2021-06-26 MED ORDER — PROPOFOL 10 MG/ML IV BOLUS
INTRAVENOUS | Status: DC | PRN
  Administered 2021-06-26: 30 mg via INTRAVENOUS
  Administered 2021-06-26: 200 mg via INTRAVENOUS

## 2021-06-26 MED ORDER — CHLORHEXIDINE GLUCONATE 4 % EX LIQD: 60.0000 mL | Freq: Once | CUTANEOUS | Status: DC

## 2021-06-26 MED ORDER — LACTATED RINGERS IR SOLN
Status: DC | PRN
  Administered 2021-06-26: 1000 mL

## 2021-06-26 MED ORDER — ONDANSETRON HCL 4 MG/2ML IJ SOLN
4.0000 mg | Freq: Four times a day (QID) | INTRAMUSCULAR | Status: DC | PRN
  Administered 2021-06-26: 4 mg via INTRAVENOUS
  Filled 2021-06-26: qty 2

## 2021-06-26 MED ORDER — "VISTASEAL 4 ML SINGLE DOSE KIT "
4.0000 mL | PACK | Freq: Once | CUTANEOUS | Status: AC
  Administered 2021-06-26: 4 mL via TOPICAL
  Filled 2021-06-26: qty 4

## 2021-06-26 MED ORDER — MIDAZOLAM HCL 2 MG/2ML IJ SOLN
INTRAMUSCULAR | Status: AC
  Filled 2021-06-26: qty 2

## 2021-06-26 MED ORDER — OXYCODONE HCL 5 MG PO TABS: 5.0000 mg | ORAL_TABLET | Freq: Once | ORAL | Status: DC | PRN

## 2021-06-26 MED ORDER — PHENYLEPHRINE HCL (PRESSORS) 10 MG/ML IV SOLN
INTRAVENOUS | Status: DC | PRN
  Administered 2021-06-26: 80 ug via INTRAVENOUS

## 2021-06-26 MED ORDER — MIDAZOLAM HCL 5 MG/5ML IJ SOLN
INTRAMUSCULAR | Status: DC | PRN
  Administered 2021-06-26: 2 mg via INTRAVENOUS

## 2021-06-26 MED ORDER — CLINDAMYCIN PHOSPHATE 900 MG/50ML IV SOLN
900.0000 mg | INTRAVENOUS | Status: AC
  Administered 2021-06-26: 900 mg via INTRAVENOUS
  Filled 2021-06-26: qty 50

## 2021-06-26 MED ORDER — KETAMINE HCL 10 MG/ML IJ SOLN
INTRAMUSCULAR | Status: AC
  Filled 2021-06-26: qty 1

## 2021-06-26 MED ORDER — SUGAMMADEX SODIUM 500 MG/5ML IV SOLN
INTRAVENOUS | Status: AC
  Filled 2021-06-26: qty 5

## 2021-06-26 MED ORDER — MORPHINE SULFATE (PF) 4 MG/ML IV SOLN
1.0000 mg | INTRAVENOUS | Status: DC | PRN
  Administered 2021-06-26: 2 mg via INTRAVENOUS
  Filled 2021-06-26: qty 1

## 2021-06-26 MED ORDER — OXYCODONE HCL 5 MG/5ML PO SOLN
5.0000 mg | Freq: Four times a day (QID) | ORAL | Status: DC | PRN
Start: 2021-06-26 — End: 2021-06-27
  Administered 2021-06-26 – 2021-06-27 (×4): 5 mg via ORAL
  Filled 2021-06-26 (×4): qty 5

## 2021-06-26 MED ORDER — GABAPENTIN 100 MG PO CAPS
200.0000 mg | ORAL_CAPSULE | Freq: Two times a day (BID) | ORAL | Status: DC
  Administered 2021-06-26 – 2021-06-27 (×3): 200 mg via ORAL
  Filled 2021-06-26 (×3): qty 2

## 2021-06-26 MED ORDER — ACETAMINOPHEN 500 MG PO TABS
1000.0000 mg | ORAL_TABLET | Freq: Three times a day (TID) | ORAL | Status: DC
  Administered 2021-06-26 – 2021-06-27 (×4): 1000 mg via ORAL
  Filled 2021-06-26 (×4): qty 2

## 2021-06-26 MED ORDER — MEPERIDINE HCL 50 MG/ML IJ SOLN: 6.2500 mg | INTRAMUSCULAR | Status: DC | PRN

## 2021-06-26 MED ORDER — ROCURONIUM BROMIDE 10 MG/ML (PF) SYRINGE: PREFILLED_SYRINGE | INTRAVENOUS | Status: DC | PRN

## 2021-06-26 MED ORDER — LEVOTHYROXINE SODIUM 112 MCG PO TABS
112.0000 ug | ORAL_TABLET | Freq: Every day | ORAL | Status: DC
  Administered 2021-06-27: 112 ug via ORAL
  Filled 2021-06-26: qty 1

## 2021-06-26 MED ORDER — HYDRALAZINE HCL 20 MG/ML IJ SOLN: 10.0000 mg | INTRAMUSCULAR | Status: DC | PRN

## 2021-06-26 MED ORDER — SCOPOLAMINE 1 MG/3DAYS TD PT72: 1.0000 | MEDICATED_PATCH | TRANSDERMAL | Status: DC

## 2021-06-26 MED ORDER — LACTATED RINGERS IV SOLN: INTRAVENOUS | Status: DC

## 2021-06-26 MED ORDER — SODIUM CHLORIDE (PF) 0.9 % IJ SOLN
INTRAMUSCULAR | Status: AC
  Filled 2021-06-26: qty 50

## 2021-06-26 MED ORDER — ONDANSETRON HCL 4 MG/2ML IJ SOLN
INTRAMUSCULAR | Status: AC
  Filled 2021-06-26: qty 2

## 2021-06-26 MED ORDER — ENSURE MAX PROTEIN PO LIQD
2.0000 [oz_av] | ORAL | Status: DC
  Administered 2021-06-27 (×4): 2 [oz_av] via ORAL

## 2021-06-26 MED ORDER — ACETAMINOPHEN 500 MG PO TABS
1000.0000 mg | ORAL_TABLET | ORAL | Status: AC
  Administered 2021-06-26: 1000 mg via ORAL
  Filled 2021-06-26: qty 2

## 2021-06-26 MED ORDER — STERILE WATER FOR IRRIGATION IR SOLN
Status: DC | PRN
  Administered 2021-06-26: 1000 mL

## 2021-06-26 MED ORDER — HYDROMORPHONE HCL 1 MG/ML IJ SOLN: 0.2500 mg | INTRAMUSCULAR | Status: DC | PRN

## 2021-06-26 MED ORDER — OXYCODONE HCL 5 MG/5ML PO SOLN: 5.0000 mg | Freq: Once | ORAL | Status: DC | PRN

## 2021-06-26 MED ORDER — HEPARIN SODIUM (PORCINE) 5000 UNIT/ML IJ SOLN
5000.0000 [IU] | INTRAMUSCULAR | Status: AC
  Administered 2021-06-26: 5000 [IU] via SUBCUTANEOUS
  Filled 2021-06-26: qty 1

## 2021-06-26 MED ORDER — BUPIVACAINE LIPOSOME 1.3 % IJ SUSP: 20.0000 mL | Freq: Once | INTRAMUSCULAR | Status: DC

## 2021-06-26 MED ORDER — SODIUM CHLORIDE 0.9 % IV SOLN
12.5000 mg | Freq: Four times a day (QID) | INTRAVENOUS | Status: DC | PRN
  Filled 2021-06-26: qty 0.5

## 2021-06-26 MED ORDER — PROPOFOL 500 MG/50ML IV EMUL
INTRAVENOUS | Status: AC
  Filled 2021-06-26: qty 50

## 2021-06-26 MED ORDER — SCOPOLAMINE 1 MG/3DAYS TD PT72
1.0000 | MEDICATED_PATCH | TRANSDERMAL | Status: DC
  Administered 2021-06-26: 1.5 mg via TRANSDERMAL
  Filled 2021-06-26: qty 1

## 2021-06-26 MED ORDER — PROPOFOL 10 MG/ML IV BOLUS
INTRAVENOUS | Status: AC
  Filled 2021-06-26: qty 40

## 2021-06-26 MED ORDER — FENTANYL CITRATE (PF) 100 MCG/2ML IJ SOLN
INTRAMUSCULAR | Status: DC | PRN
  Administered 2021-06-26 (×2): 100 ug via INTRAVENOUS
  Administered 2021-06-26 (×3): 50 ug via INTRAVENOUS
  Administered 2021-06-26: 150 ug via INTRAVENOUS
  Administered 2021-06-26: 50 ug via INTRAVENOUS

## 2021-06-26 MED ORDER — ENOXAPARIN SODIUM 30 MG/0.3ML IJ SOSY
30.0000 mg | PREFILLED_SYRINGE | Freq: Two times a day (BID) | INTRAMUSCULAR | Status: DC
  Administered 2021-06-26 – 2021-06-27 (×2): 30 mg via SUBCUTANEOUS
  Filled 2021-06-26 (×2): qty 0.3

## 2021-06-26 MED ORDER — LIP MEDEX EX OINT
TOPICAL_OINTMENT | CUTANEOUS | Status: AC
  Filled 2021-06-26: qty 7

## 2021-06-26 MED ORDER — PANTOPRAZOLE SODIUM 40 MG IV SOLR
40.0000 mg | Freq: Every day | INTRAVENOUS | Status: DC
  Administered 2021-06-26: 40 mg via INTRAVENOUS
  Filled 2021-06-26: qty 40

## 2021-06-26 MED ORDER — KCL IN DEXTROSE-NACL 20-5-0.45 MEQ/L-%-% IV SOLN
INTRAVENOUS | Status: DC
  Filled 2021-06-26 (×3): qty 1000

## 2021-06-26 MED ORDER — PROMETHAZINE HCL 25 MG/ML IJ SOLN: 6.2500 mg | INTRAMUSCULAR | Status: DC | PRN

## 2021-06-26 MED ORDER — FENTANYL CITRATE (PF) 250 MCG/5ML IJ SOLN
INTRAMUSCULAR | Status: AC
  Filled 2021-06-26: qty 5

## 2021-06-26 MED ORDER — SUGAMMADEX SODIUM 500 MG/5ML IV SOLN
INTRAVENOUS | Status: DC | PRN
Start: 2021-06-26 — End: 2021-06-26
  Administered 2021-06-26: 300 mg via INTRAVENOUS

## 2021-06-26 MED ORDER — FENTANYL CITRATE (PF) 100 MCG/2ML IJ SOLN
INTRAMUSCULAR | Status: AC
  Filled 2021-06-26: qty 2

## 2021-06-26 MED ORDER — ORAL CARE MOUTH RINSE: 15.0000 mL | Freq: Once | OROMUCOSAL | Status: AC

## 2021-06-26 MED ORDER — APREPITANT 40 MG PO CAPS
40.0000 mg | ORAL_CAPSULE | ORAL | Status: AC
  Administered 2021-06-26: 40 mg via ORAL
  Filled 2021-06-26: qty 1

## 2021-06-26 MED ORDER — BUPIVACAINE LIPOSOME 1.3 % IJ SUSP
INTRAMUSCULAR | Status: DC | PRN
  Administered 2021-06-26: 20 mL

## 2021-06-26 MED ORDER — SUCCINYLCHOLINE CHLORIDE 200 MG/10ML IV SOSY
PREFILLED_SYRINGE | INTRAVENOUS | Status: DC | PRN
  Administered 2021-06-26: 160 mg via INTRAVENOUS

## 2021-06-26 MED ORDER — BUPIVACAINE LIPOSOME 1.3 % IJ SUSP
INTRAMUSCULAR | Status: AC
  Filled 2021-06-26: qty 20

## 2021-06-26 MED ORDER — ACETAMINOPHEN 160 MG/5ML PO SOLN: 1000.0000 mg | Freq: Three times a day (TID) | ORAL | Status: DC

## 2021-06-26 MED ORDER — LORATADINE 10 MG PO TABS
10.0000 mg | ORAL_TABLET | Freq: Every day | ORAL | Status: DC
  Administered 2021-06-26: 10 mg via ORAL
  Filled 2021-06-26: qty 1

## 2021-06-26 MED ORDER — DEXAMETHASONE SODIUM PHOSPHATE 10 MG/ML IJ SOLN
INTRAMUSCULAR | Status: DC | PRN
  Administered 2021-06-26: 5 mg via INTRAVENOUS

## 2021-06-26 MED ORDER — SIMETHICONE 80 MG PO CHEW
80.0000 mg | CHEWABLE_TABLET | Freq: Four times a day (QID) | ORAL | Status: DC | PRN
  Administered 2021-06-26 – 2021-06-27 (×4): 80 mg via ORAL
  Filled 2021-06-26 (×4): qty 1

## 2021-06-26 MED ORDER — ACETAMINOPHEN 500 MG PO TABS: 1000.0000 mg | ORAL_TABLET | Freq: Once | ORAL | Status: DC

## 2021-06-26 MED ORDER — PHENYLEPHRINE 40 MCG/ML (10ML) SYRINGE FOR IV PUSH (FOR BLOOD PRESSURE SUPPORT)
PREFILLED_SYRINGE | INTRAVENOUS | Status: AC
  Filled 2021-06-26: qty 10

## 2021-06-26 MED ORDER — FIBRIN SEALANT 2 ML SINGLE DOSE KIT
2.0000 mL | PACK | Freq: Once | CUTANEOUS | Status: AC
  Administered 2021-06-26: 2 mL via TOPICAL
  Filled 2021-06-26: qty 2

## 2021-06-26 MED ORDER — PROPOFOL 1000 MG/100ML IV EMUL
INTRAVENOUS | Status: AC
  Filled 2021-06-26: qty 100

## 2021-06-26 MED ORDER — DEXAMETHASONE SODIUM PHOSPHATE 4 MG/ML IJ SOLN: 4.0000 mg | INTRAMUSCULAR | Status: DC

## 2021-06-26 MED ORDER — LIDOCAINE 2% (20 MG/ML) 5 ML SYRINGE
INTRAMUSCULAR | Status: DC | PRN
  Administered 2021-06-26: 20 mg via INTRAVENOUS

## 2021-06-26 MED ORDER — KETAMINE HCL 10 MG/ML IJ SOLN
INTRAMUSCULAR | Status: DC | PRN
  Administered 2021-06-26: 10 mg via INTRAVENOUS
  Administered 2021-06-26: 20 mg via INTRAVENOUS
  Administered 2021-06-26: 50 mg via INTRAVENOUS

## 2021-06-26 MED ORDER — CHLORHEXIDINE GLUCONATE 0.12 % MT SOLN
15.0000 mL | Freq: Once | OROMUCOSAL | Status: AC
  Administered 2021-06-26: 15 mL via OROMUCOSAL

## 2021-06-26 SURGICAL SUPPLY — 112 items
APL LAPSCP 35 DL APL RGD (MISCELLANEOUS) ×2
APL PRP STRL LF DISP 70% ISPRP (MISCELLANEOUS) ×1
APL SWBSTK 6 STRL LF DISP (MISCELLANEOUS) ×1
APPLICATOR COTTON TIP 6 STRL (MISCELLANEOUS) ×1 IMPLANT
APPLICATOR COTTON TIP 6IN STRL (MISCELLANEOUS) ×2
APPLICATOR VISTASEAL 35 (MISCELLANEOUS) ×4 IMPLANT
APPLIER CLIP ROT 13.4 12 LRG (CLIP)
APR CLP LRG 13.4X12 ROT 20 MLT (CLIP)
BAG COUNTER SPONGE SURGICOUNT (BAG) IMPLANT
BAG SPNG CNTER NS LX DISP (BAG)
BLADE EXTENDED COATED 6.5IN (ELECTRODE) IMPLANT
BLADE HEX COATED 2.75 (ELECTRODE) ×2 IMPLANT
BLADE SURG SZ11 CARB STEEL (BLADE) ×2 IMPLANT
BNDG ADH 1X3 SHEER STRL LF (GAUZE/BANDAGES/DRESSINGS) IMPLANT
BNDG ADH THN 3X1 STRL LF (GAUZE/BANDAGES/DRESSINGS)
CABLE HIGH FREQUENCY MONO STRZ (ELECTRODE) IMPLANT
CHLORAPREP W/TINT 26 (MISCELLANEOUS) ×3 IMPLANT
CLIP APPLIE ROT 13.4 12 LRG (CLIP) IMPLANT
CLIP SUT LAPRA TY ABSORB (SUTURE) ×4 IMPLANT
COVER MAYO STAND STRL (DRAPES) IMPLANT
CUTTER FLEX LINEAR 45M (STAPLE) IMPLANT
DEVICE SUT QUICK LOAD TK 5 (STAPLE) ×1 IMPLANT
DEVICE SUT TI-KNOT TK 5X26 (MISCELLANEOUS) ×1 IMPLANT
DEVICE SUTURE ENDOST 10MM (ENDOMECHANICALS) ×2 IMPLANT
DRAIN PENROSE 0.25X18 (DRAIN) ×2 IMPLANT
DRAIN PENROSE 0.5X18 (DRAIN) ×2 IMPLANT
DRAPE LAPAROSCOPIC ABDOMINAL (DRAPES) ×2 IMPLANT
DRAPE UTILITY XL STRL (DRAPES) ×2 IMPLANT
DRAPE WARM FLUID 44X44 (DRAPES) IMPLANT
DRSG TEGADERM 2-3/8X2-3/4 SM (GAUZE/BANDAGES/DRESSINGS) ×2 IMPLANT
ELECT REM PT RETURN 15FT ADLT (MISCELLANEOUS) ×2 IMPLANT
GAUZE 4X4 16PLY ~~LOC~~+RFID DBL (SPONGE) ×2 IMPLANT
GAUZE SPONGE 2X2 8PLY STRL LF (GAUZE/BANDAGES/DRESSINGS) ×1 IMPLANT
GAUZE SPONGE 4X4 12PLY STRL (GAUZE/BANDAGES/DRESSINGS) ×2 IMPLANT
GLOVE SRG 8 PF TXTR STRL LF DI (GLOVE) ×2 IMPLANT
GLOVE SURG ENC TEXT LTX SZ7.5 (GLOVE) ×2 IMPLANT
GLOVE SURG NEOPR MICRO LF SZ8 (GLOVE) ×2 IMPLANT
GLOVE SURG POLY ORTHO LF SZ7.5 (GLOVE) ×4 IMPLANT
GLOVE SURG UNDER POLY LF SZ7 (GLOVE) ×2 IMPLANT
GLOVE SURG UNDER POLY LF SZ8 (GLOVE) ×4
GOWN STRL REUS W/TWL LRG LVL3 (GOWN DISPOSABLE) ×2 IMPLANT
GOWN STRL REUS W/TWL XL LVL3 (GOWN DISPOSABLE) ×8 IMPLANT
HANDLE SUCTION POOLE (INSTRUMENTS) IMPLANT
IRRIG SUCT STRYKERFLOW 2 WTIP (MISCELLANEOUS) ×2
IRRIGATION SUCT STRKRFLW 2 WTP (MISCELLANEOUS) ×1 IMPLANT
KIT BASIN OR (CUSTOM PROCEDURE TRAY) ×2 IMPLANT
KIT GASTRIC LAVAGE 34FR ADT (SET/KITS/TRAYS/PACK) ×2 IMPLANT
KIT TURNOVER KIT A (KITS) ×2 IMPLANT
MARKER SKIN DUAL TIP RULER LAB (MISCELLANEOUS) ×2 IMPLANT
MAT PREVALON FULL STRYKER (MISCELLANEOUS) ×2 IMPLANT
NDL SPNL 22GX3.5 QUINCKE BK (NEEDLE) ×1 IMPLANT
NEEDLE SPNL 22GX3.5 QUINCKE BK (NEEDLE) ×2 IMPLANT
NS IRRIG 1000ML POUR BTL (IV SOLUTION) ×2 IMPLANT
PACK CARDIOVASCULAR III (CUSTOM PROCEDURE TRAY) ×2 IMPLANT
PACK GENERAL/GYN (CUSTOM PROCEDURE TRAY) ×2 IMPLANT
PENCIL SMOKE EVACUATOR (MISCELLANEOUS) IMPLANT
RELOAD 45 VASCULAR/THIN (ENDOMECHANICALS) IMPLANT
RELOAD ENDO STITCH 2.0 (ENDOMECHANICALS) ×20
RELOAD STAPLE 45 2.5 WHT GRN (ENDOMECHANICALS) IMPLANT
RELOAD STAPLE 45 3.5 BLU ETS (ENDOMECHANICALS) IMPLANT
RELOAD STAPLE 60 2.6 WHT THN (STAPLE) ×2 IMPLANT
RELOAD STAPLE 60 3.6 BLU REG (STAPLE) ×2 IMPLANT
RELOAD STAPLE 60 3.8 GOLD REG (STAPLE) ×1 IMPLANT
RELOAD STAPLE TA45 3.5 REG BLU (ENDOMECHANICALS) IMPLANT
RELOAD STAPLER BLUE 60MM (STAPLE) ×2 IMPLANT
RELOAD STAPLER GOLD 60MM (STAPLE) ×1 IMPLANT
RELOAD STAPLER WHITE 60MM (STAPLE) ×2 IMPLANT
RELOAD SUT SNGL STCH ABSRB 2-0 (ENDOMECHANICALS) ×5 IMPLANT
RELOAD SUT SNGL STCH BLK 2-0 (ENDOMECHANICALS) ×4 IMPLANT
SCISSORS LAP 5X45 EPIX DISP (ENDOMECHANICALS) ×2 IMPLANT
SET TUBE SMOKE EVAC HIGH FLOW (TUBING) ×2 IMPLANT
SHEARS HARMONIC ACE PLUS 45CM (MISCELLANEOUS) ×2 IMPLANT
SLEEVE XCEL OPT CAN 5 100 (ENDOMECHANICALS) ×2 IMPLANT
SOL ANTI FOG 6CC (MISCELLANEOUS) ×1 IMPLANT
SOLUTION ANTI FOG 6CC (MISCELLANEOUS) ×1
SPONGE GAUZE 2X2 STER 10/PKG (GAUZE/BANDAGES/DRESSINGS) ×1
SPONGE T-LAP 18X18 ~~LOC~~+RFID (SPONGE) IMPLANT
STAPLER ECHELON BIOABSB 60 FLE (MISCELLANEOUS) IMPLANT
STAPLER ECHELON LONG 60 440 (INSTRUMENTS) ×2 IMPLANT
STAPLER RELOAD BLUE 60MM (STAPLE) ×4
STAPLER RELOAD GOLD 60MM (STAPLE) ×2
STAPLER RELOAD WHITE 60MM (STAPLE) ×4
STAPLER VISISTAT 35W (STAPLE) ×2 IMPLANT
STRIP CLOSURE SKIN 1/2X4 (GAUZE/BANDAGES/DRESSINGS) ×12 IMPLANT
SUCTION POOLE HANDLE (INSTRUMENTS)
SURGILUBE 2OZ TUBE FLIPTOP (MISCELLANEOUS) ×2 IMPLANT
SUT MNCRL AB 4-0 PS2 18 (SUTURE) ×3 IMPLANT
SUT PDS AB 1 CTX 36 (SUTURE) IMPLANT
SUT RELOAD ENDO STITCH 2 48X1 (ENDOMECHANICALS) ×6
SUT RELOAD ENDO STITCH 2.0 (ENDOMECHANICALS) ×4
SUT SILK 2 0 (SUTURE)
SUT SILK 2 0 SH CR/8 (SUTURE) IMPLANT
SUT SILK 2-0 18XBRD TIE 12 (SUTURE) IMPLANT
SUT SILK 3 0 (SUTURE)
SUT SILK 3 0 SH CR/8 (SUTURE) IMPLANT
SUT SILK 3-0 18XBRD TIE 12 (SUTURE) IMPLANT
SUT SURGIDAC NAB ES-9 0 48 120 (SUTURE) ×1 IMPLANT
SUT VIC AB 2-0 SH 27 (SUTURE) ×2
SUT VIC AB 2-0 SH 27X BRD (SUTURE) ×1 IMPLANT
SUT VICRYL 2 0 18  UND BR (SUTURE)
SUT VICRYL 2 0 18 UND BR (SUTURE) IMPLANT
SUTURE RELOAD END STTCH 2 48X1 (ENDOMECHANICALS) ×6 IMPLANT
SUTURE RELOAD ENDO STITCH 2.0 (ENDOMECHANICALS) ×4 IMPLANT
SYR 20ML LL LF (SYRINGE) ×4 IMPLANT
TOWEL OR 17X26 10 PK STRL BLUE (TOWEL DISPOSABLE) ×4 IMPLANT
TOWEL OR NON WOVEN STRL DISP B (DISPOSABLE) ×2 IMPLANT
TRAY FOLEY MTR SLVR 16FR STAT (SET/KITS/TRAYS/PACK) IMPLANT
TROCAR BLADELESS OPT 5 100 (ENDOMECHANICALS) ×2 IMPLANT
TROCAR UNIVERSAL OPT 12M 100M (ENDOMECHANICALS) ×6 IMPLANT
TROCAR XCEL 12X100 BLDLESS (ENDOMECHANICALS) ×2 IMPLANT
TUBING CONNECTING 10 (TUBING) ×4 IMPLANT
YANKAUER SUCT BULB TIP NO VENT (SUCTIONS) IMPLANT

## 2021-06-26 NOTE — Progress Notes (Signed)
PHARMACY CONSULT FOR:  Risk Assessment for Post-Discharge VTE Following Bariatric Surgery  Post-Discharge VTE Risk Assessment: This patient's probability of 30-day post-discharge VTE is increased due to the factors marked:   Female    Age >/=60 years    BMI >/=50 kg/m2    CHF    Dyspnea at Rest    Paraplegia  x  Non-gastric-band surgery    Operation Time >/=3 hr    Return to OR     Length of Stay >/= 3 d   Hx of VTE   Hypercoagulable condition   Significant venous stasis       Predicted probability of 30-day post-discharge VTE: 0.16% (but note that case was very close to being >= 3 hours (8 min shy) so if make that a risk factor as above, risk rises to 0.25%)  Other patient-specific factors to consider: n/a   Recommendation for Discharge: No pharmacologic prophylaxis post-discharge    Kristina [name] is a 50 y.o. female who underwent laparoscopic Roux-en-Y gastric bypass on 06/26/21   Case start: 0804 Case end: 1056   Allergies  Allergen Reactions   Ampicillin Anaphylaxis    Has patient had a PCN reaction causing immediate rash, facial/tongue/throat swelling, SOB or lightheadedness with hypotension: Yes Has patient had a PCN reaction causing severe rash involving mucus membranes or skin necrosis: Yes Has patient had a PCN reaction that required hospitalization Yes Has patient had a PCN reaction occurring within the last 10 years: No If all of the above answers are "NO", then may proceed with Cephalosporin use.    Codeine Hives and Shortness Of Breath   Sulfa Antibiotics Itching and Rash   Sulfonamide Derivatives Itching and Rash    Patient Measurements: Height: 5\' 3"  (160 cm) Weight: 100.7 kg (222 lb) IBW/kg (Calculated) : 52.4 Body mass index is 39.33 kg/m.  No results for input(s): WBC, HGB, HCT, PLT, APTT, CREATININE, LABCREA, CREATININE, CREAT24HRUR, MG, PHOS, ALBUMIN, PROT, ALBUMIN, AST, ALT, ALKPHOS, BILITOT, BILIDIR, IBILI in the last 72  hours. Estimated Creatinine Clearance: 96.3 mL/min (by C-G formula based on SCr of 0.66 mg/dL).    Past Medical History:  Diagnosis Date   Allergic rhinitis    Anxiety    Aortic dilatation (HCC)    Asthma    Cervical cancer (HCC)    Chronic back pain    Chronic cough    Depression    Diverticulosis    Essential hypertension    GERD (gastroesophageal reflux disease)    History of bronchitis    History of pneumonia    Hx of adenomatous and sessile serrated colonic polyps    Hyperlipidemia    Hypothyroidism    Lumbosacral spondylosis without myelopathy    Bulging disc - chronic pain   Obesity    Bariatric surgery   Obstructive sleep apnea    PONV (postoperative nausea and vomiting)    Restless leg    Tachycardia    Type 2 diabetes mellitus (HCC)    Vitamin D deficiency    Vocal cord dysfunction      Medications Prior to Admission  Medication Sig Dispense Refill Last Dose   buPROPion (WELLBUTRIN SR) 200 MG 12 hr tablet Take 200 mg by mouth 2 (two) times daily.   06/25/2021   dexlansoprazole (DEXILANT) 60 MG capsule Take 1 capsule (60 mg total) by mouth daily before breakfast. (Patient taking differently: Take 60 mg by mouth at bedtime.) 90 capsule 3 06/25/2021   levocetirizine (XYZAL) 5 MG tablet Take  5 mg by mouth at bedtime.   06/25/2021   levothyroxine (SYNTHROID) 112 MCG tablet Take 112 mcg by mouth daily before breakfast.   06/26/2021 at 0423   albuterol (VENTOLIN HFA) 108 (90 Base) MCG/ACT inhaler Inhale 1-2 puffs into the lungs every 6 (six) hours as needed for wheezing or shortness of breath.   More than a month   cyanocobalamin (,VITAMIN B-12,) 1000 MCG/ML injection Inject 1,000 mcg into the muscle every 30 (thirty) days.   More than a month       Kara Mead 06/26/2021,11:35 AM

## 2021-06-26 NOTE — Transfer of Care (Signed)
Immediate Anesthesia Transfer of Care Note  Patient: JEWELIA BOCCHINO  Procedure(s) Performed: LAPAROSCOPIC ROUX-EN-Y GASTRIC BYPASS WITH UPPER ENDOSCOPY (Abdomen) HERNIA REPAIR HIATAL (Abdomen)  Patient Location: PACU  Anesthesia Type:General  Level of Consciousness: awake, alert , sedated and patient cooperative  Airway & Oxygen Therapy: Patient Spontanous Breathing and Patient connected to face mask oxygen  Post-op Assessment: Report given to RN, Post -op Vital signs reviewed and stable and Patient moving all extremities X 4  Post vital signs: stable  Last Vitals:  Vitals Value Taken Time  BP 143/93 06/26/21 1105  Temp 37 C 06/26/21 1105  Pulse 84 06/26/21 1109  Resp 10 06/26/21 1109  SpO2 96 % 06/26/21 1109  Vitals shown include unvalidated device data.  Last Pain:  Vitals:   06/26/21 1105  TempSrc:   PainSc: Asleep         Complications: No notable events documented.

## 2021-06-26 NOTE — Op Note (Signed)
Kristina [name] [id number] 11/20/1970. 06/26/2021  Preoperative diagnosis:  Refractory GERD Possible recurrent hiatal hernia H/o laparoscopic sleeve gastrectomy with sliding hiatal hernia repair 09/2015 Obesity BMI 39  Postoperative  diagnosis:  Refractory GERD Recurrent hiatal hernia H/o laparoscopic sleeve gastrectomy with sliding hiatal hernia repair 09/2015 Obesity BMI 39  Surgical procedure: Laparoscopic Repair of Recurrent Hiatal Hernia, laparoscopic conversion of sleeve gastrectomy to Roux-en-Y gastric bypass (ante-colic, ante-gastric); upper endoscopy; laparoscopic lysis of adhesions, laparoscopic TAP block  Surgeon: [name], M.D. FACS  Asst.: Johnathan Hausen MD FACS  Anesthesia: General plus exparel/marcaine mix  Complications: None   EBL: Minimal   Drains: None   Disposition: PACU in good condition   Indications for procedure: 50 y.o. yo female  with obesity who underwent laparoscopic sleeve gastrectomy with sliding hiatal hernia repair in January 2017.  She did quite well with respect to her weight loss.  She is maintained about a 90 pound weight loss.  Unfortunately she has developed refractory GERD despite multiple different trials of PPIs.  She has had upper endoscopy, upper GI and CT.  There is concerns for possible recurrent hiatal hernia on her upper endoscopy of up to 5 cm.  Interestingly her upper GI did not show this.  We discussed different options such as pure hiatal hernia repair alone versus hiatal hernia repair with conversion to Roux-en-Y gastric bypass.  We discussed that the procedure with the highest likelihood of success for resolution of her GERD would be conversion to Roux-en-Y gastric bypass.  She elected to undergo that procedure after undergoing extensive work-up and evaluation.   The patient's comorbidities are listed above. We discussed the risk and benefits of surgery including but not limited to anesthesia risk, bleeding, infection, blood  clot formation, anastomotic leak, anastomotic stricture, ulcer formation, death, respiratory complications, intestinal blockage, internal hernia, gallstone formation, vitamin and nutritional deficiencies, injury to surrounding structures, failure to lose weight and mood changes.   Description of procedure: Patient is brought to the operating room and general anesthesia induced. The patient had received preoperative broad-spectrum IV antibiotics and subcutaneous heparin. The abdomen was widely sterilely prepped with Chloraprep and draped. Patient timeout was performed and correct patient and procedure confirmed. Access was obtained with a 12 mm Optiview trocar in the left upper quadrant and pneumoperitoneum established without difficulty. Under direct vision 12 mm trocars were placed laterally in the right upper quadrant, right upper quadrant midclavicular line, and to the left and above the umbilicus for the camera port. A 5 mm trocar was placed laterally in the left upper quadrant.  Exparel/marcaine mix was infiltrated in bilateral lateral abdominal walls as a TAP block for postoperative pain relief.  The patient was then placed in steep reversed Trendelenburg. Through a 5 mm subxiphoid site the [facility] retractor was placed and the left lobe of the liver elevated with excellent exposure of the upper stomach and hiatus.  There is evidence of a recurrent hernia.  We identified the right crus of the diaphragm.  There was expected adhesions from prior mobilization of the perisleeve/perigastric tissue to the right crura.  I visualized the previously placed Ethibond suture which was still intact.  It appeared the recurrence was mainly anterior at her diaphragm.  I was able to gently incised the peritoneum overlying the right crus of the diaphragm and then using gentle blunt dissection with a Prestige grasper mobilize the sleeve off of the right crus of the diaphragm and get into the mediastinum.  The esophagus  was  visualized.  Continued the mobilization across anteriorly at the hiatus with harmonic scalpel.  We continued it over to the left side a little bit.  I then took down some omental adhesions along the greater curvature of the sleeve near the diaphragm. I turned my attention back to the right crus of the diaphragm.  I wanted to identify the confluence of the left and right crus.  As stated the previously placed Ethibond suture was still intact.  I was able to mobilize the posterior sleeve and posterior esophagus off the confluence of the left and right crura as well as where the diaphragm had been previously repaired.  Is a little bit stuck in this location but using gentle dissection with scissors without cautery I was able to develop a plane posteriorly.  Identified the left crus.  I was able to pass a Penrose drain retrosleeve and my assistant held this up.  This allowed me to continue posterior mobilization of the esophagus off the aorta.  We then continued a circumferential mobilization of the esophagus in the distal mediastinum.  We had achieved 2 cm of esophageal length into the abdominal cavity.  At this time I placed 2 interrupted 0 Ethibond Surgidac sutures between the left and right crura each secured with a titanium tie knot on top of the previously intact cruroplasty suture.  We had the CRNA placed a balloon calibration tube down the patient's oropharynx down her esophagus into the sleeve.  It was inflated with 10 cc of air.  The tube with the inflated balloon was then gently pulled back.  It did not slide past the diaphragm.  This told me that I had achieved an adequate cruroplasty.  The balloon was deflated and the calibration tube was removed.  I then measured what appeared to be about 6 cm from the GE junction along the lesser curvature of the sleeve.  I then took down this tissue right next to the sleeve with harmonic scalpel to get retrosleeve.  We were able to pass a grasper eventually from the  lesser curvature of the sleeve to the greater curvature of the sleeve.  Ensuring that there were no tubes in the oropharynx I then divided distally at about 6 cm from the GE junction with a Ethicon Echelon 60 stapler with a gold load.  There is about 2 mm of intact sleeve still left.  This was divided with a blue load cartridge.  There was a little bit of bleeding from the gastric remnant along the greater curve this was taking care of with a figure of eight 2-0 Vicryl suture.  There is good hemostasis.  We then placed the patient's supine. The omentum was brought into the upper abdomen after mobilizing it from the lower abdominal wall and pelvis-this took about 10 minutes to do.  And the transverse mesocolon elevated and the ligament of Treitz clearly identified. A 50 cm biliopancreatic limb was then carefully measured from the ligament of Treitz. The small intestine was divided at this point with a single firing of the white load linear stapler. A Penrose drain was sutured to the end of the Roux-en-Y limb for later identification. A 100 cm Roux-en-Y limb was then carefully measured. At this point a side-to-side anastomosis was created between the Roux limb and the end of the biliopancreatic limb. This was accomplished with a single firing of the 60 mm white load linear stapler. The common enterotomy was closed with a running 2-0 Vicryl begun at either end of  the enterotomy and tied centrally.  Vistaseal tissue sealant was placed over the anastomosis. The mesenteric defect was then closed with running 2-0 silk. The omentum was then divided with the harmonic scalpel up towards the transverse colon to allow mobility of the Roux limb toward the gastric pouch.    The Roux limb was then brought up in an antecolic fashion with the candycane facing to the patient's left without undue tension. The gastrojejunostomy was created with an initial posterior row of 2-0 Vicryl between the Roux limb and the staple line of  the gastric pouch. Enterotomies were then made in the gastric pouch and the Roux limb with the harmonic scalpel and at approximately 2-2-1/2 cm anastomosis was created with a single firing of the 34mm blue load linear stapler. The staple line was inspected and was intact without bleeding. The common enterotomy was then closed with running 2-0 Vicryl begun at either end and tied centrally. The Ewall tube was then easily passed through the anastomosis and an outer anterior layer of running 2-0 Vicryl was placed. The Ewald tube was removed. With the outlet of the gastrojejunostomy clamped and under saline irrigation the assistant performed upper endoscopy and with the gastric pouch tensely distended with air-there was no evidence of leak on this test. The pouch was desufflated.  It appeared that the gastric pouch was more like 7 cm.  The Terance Hart defect was closed with running 2-0 silk. The abdomen was inspected for any evidence of bleeding or bowel injury and everything looked fine. The Nathanson retractor was removed under direct vision after coating the anastomosis with Vistaseal tissue sealant. All CO2 was evacuated and trochars removed. Skin incisions were closed with 4-0 monocryl in a subcuticular fashion followed by  steri-strips and bandages. Sponge needle and instrument counts were correct. The patient was taken to the PACU in good condition.    Leighton Ruff. Redmond Pulling, MD, FACS General, Bariatric, & Minimally Invasive Surgery [facility], [place]

## 2021-06-26 NOTE — Interval H&P Note (Signed)
History and Physical Interval Note:  06/26/2021 7:24 AM  Kristina [name]  has presented today for surgery, with the diagnosis of MORBID OBESITY.  The various methods of treatment have been discussed with the patient and family. After consideration of risks, benefits and other options for treatment, the patient has consented to  Procedure(s): LAPAROSCOPIC ROUX-EN-Y GASTRIC BYPASS WITH UPPER ENDOSCOPY (N/A) HERNIA REPAIR HIATAL (N/A) as a surgical intervention.  The patient's history has been reviewed, patient examined, no change in status, stable for surgery.  I have reviewed the patient's chart and labs.  Questions were answered to the patient's satisfaction.     [name]

## 2021-06-26 NOTE — Progress Notes (Signed)

## 2021-06-26 NOTE — Op Note (Signed)
Kristina [name] [id number] [birth year] 06/26/2021  Preoperative diagnosis: conversion from sleeve to roux en Y   Postoperative diagnosis: Same   Procedure: Upper endoscopy   Surgeon: Catalina Antigua B. Hassell Done  M.D., FACS   Anesthesia: Gen.   Indications for procedure: This patient was undergoing a repair of a hiatal hernia and conversion of sleeve to gastric bypass.    Description of procedure: The endoscopy was placed in the mouth and into the oropharynx and under endoscopic vision it was advanced to the esophagogastric junction.  The pouch was insufflated and was cylindrical and ~ 8 cm in length.  .   No bleeding or leaks were detected.  The scope was withdrawn without difficulty.     Matt B. Hassell Done, MD, FACS General, Bariatric, & Minimally Invasive Surgery [facility], [place]  c

## 2021-06-26 NOTE — Anesthesia Postprocedure Evaluation (Signed)
Anesthesia Post Note  Patient: ADREAM PARZYCH  Procedure(s) Performed: LAPAROSCOPIC ROUX-EN-Y GASTRIC BYPASS WITH UPPER ENDOSCOPY (Abdomen) HERNIA REPAIR HIATAL (Abdomen)     Patient location during evaluation: PACU Anesthesia Type: General Level of consciousness: awake and alert, patient cooperative and oriented Pain management: pain level controlled Vital Signs Assessment: post-procedure vital signs reviewed and stable Respiratory status: spontaneous breathing, nonlabored ventilation and respiratory function stable Cardiovascular status: blood pressure returned to baseline and stable Postop Assessment: no apparent nausea or vomiting Anesthetic complications: no   No notable events documented.  Last Vitals:  Vitals:   06/26/21 1115 06/26/21 1130  BP: 129/76 121/80  Pulse: 88 86  Resp: 15 16  Temp:    SpO2: 97% 95%    Last Pain:  Vitals:   06/26/21 1130  TempSrc:   PainSc: Asleep                 Shadrach Bartunek,E. Maxwell Martorano

## 2021-06-26 NOTE — Anesthesia Procedure Notes (Signed)
Procedure Name: Intubation Date/Time: 06/26/2021 7:39 AM Performed by: Lissa Morales, CRNA Pre-anesthesia Checklist: Patient identified, Emergency Drugs available, Suction available and Patient being monitored Patient Re-evaluated:Patient Re-evaluated prior to induction Oxygen Delivery Method: Circle system utilized Preoxygenation: Pre-oxygenation with 100% oxygen Induction Type: IV induction and Rapid sequence Laryngoscope Size: Mac and 4 Grade View: Grade II Tube type: Oral Tube size: 7.0 mm Number of attempts: 1 Airway Equipment and Method: Stylet and Oral airway Placement Confirmation: ETT inserted through vocal cords under direct vision, positive ETCO2 and breath sounds checked- equal and bilateral Secured at: 22 cm Tube secured with: Tape Dental Injury: Teeth and Oropharynx as per pre-operative assessment

## 2021-06-27 ENCOUNTER — Encounter (HOSPITAL_COMMUNITY): Payer: Self-pay | Admitting: General Surgery

## 2021-06-27 ENCOUNTER — Encounter: Payer: Self-pay | Admitting: Hematology and Oncology

## 2021-06-27 ENCOUNTER — Other Ambulatory Visit (HOSPITAL_COMMUNITY): Payer: Self-pay

## 2021-06-27 LAB — CBC WITH DIFFERENTIAL/PLATELET
Abs Immature Granulocytes: 0.03 10*3/uL (ref 0.00–0.07)
Basophils Absolute: 0 10*3/uL (ref 0.0–0.1)
Basophils Relative: 0 %
Eosinophils Absolute: 0 10*3/uL (ref 0.0–0.5)
Eosinophils Relative: 0 %
HCT: 40 % (ref 36.0–46.0)
Hemoglobin: 13.1 g/dL (ref 12.0–15.0)
Immature Granulocytes: 0 %
Lymphocytes Relative: 19 %
Lymphs Abs: 1.6 10*3/uL (ref 0.7–4.0)
MCH: 29.5 pg (ref 26.0–34.0)
MCHC: 32.8 g/dL (ref 30.0–36.0)
MCV: 90.1 fL (ref 80.0–100.0)
Monocytes Absolute: 0.7 10*3/uL (ref 0.1–1.0)
Monocytes Relative: 8 %
Neutro Abs: 6.1 10*3/uL (ref 1.7–7.7)
Neutrophils Relative %: 73 %
Platelets: 175 10*3/uL (ref 150–400)
RBC: 4.44 MIL/uL (ref 3.87–5.11)
RDW: 12.5 % (ref 11.5–15.5)
WBC: 8.4 10*3/uL (ref 4.0–10.5)
nRBC: 0 % (ref 0.0–0.2)

## 2021-06-27 LAB — COMPREHENSIVE METABOLIC PANEL
ALT: 248 U/L — ABNORMAL HIGH (ref 0–44)
AST: 252 U/L — ABNORMAL HIGH (ref 15–41)
Albumin: 3.8 g/dL (ref 3.5–5.0)
Alkaline Phosphatase: 91 U/L (ref 38–126)
Anion gap: 10 (ref 5–15)
BUN: 10 mg/dL (ref 6–20)
CO2: 26 mmol/L (ref 22–32)
Calcium: 9.1 mg/dL (ref 8.9–10.3)
Chloride: 104 mmol/L (ref 98–111)
Creatinine, Ser: 0.64 mg/dL (ref 0.44–1.00)
GFR, Estimated: >60 mL/min (ref >60)
Glucose, Bld: 120 mg/dL — ABNORMAL HIGH (ref 70–99)
Potassium: 4.1 mmol/L (ref 3.5–5.1)
Sodium: 140 mmol/L (ref 135–145)
Total Bilirubin: 0.4 mg/dL (ref 0.3–1.2)
Total Protein: 6.7 g/dL (ref 6.5–8.1)

## 2021-06-27 MED ORDER — TRAMADOL HCL 50 MG PO TABS
50.0000 mg | ORAL_TABLET | Freq: Four times a day (QID) | ORAL | 0 refills | Status: DC | PRN
  Filled 2021-06-27: qty 10, 3d supply, fill #0

## 2021-06-27 MED ORDER — ACETAMINOPHEN 500 MG PO TABS: 1000.0000 mg | ORAL_TABLET | Freq: Three times a day (TID) | ORAL | 0 refills | Status: AC

## 2021-06-27 MED ORDER — ONDANSETRON 4 MG PO TBDP
4.0000 mg | ORAL_TABLET | Freq: Four times a day (QID) | ORAL | 0 refills | Status: DC | PRN
  Filled 2021-06-27: qty 20, 5d supply, fill #0

## 2021-06-27 NOTE — Progress Notes (Signed)
Discharge instructions given to patient and all questions were answered.  

## 2021-06-27 NOTE — Progress Notes (Signed)
24hr fluid recall: 1556mL. Per dehydration protocol, will call to f/u with pt within one week post op.

## 2021-06-27 NOTE — Discharge Instructions (Signed)

## 2021-06-27 NOTE — Progress Notes (Signed)
Patient alert and oriented, pain is controlled. Patient is tolerating fluids, advanced to protein shake today, patient is tolerating well. Reviewed Gastric Bypass discharge instructions with patient and patient is able to articulate understanding. Provided information on BELT program, Support Group and [facility] outpatient pharmacy. All questions answered, will continue to monitor.    

## 2021-06-27 NOTE — Progress Notes (Signed)
Nutrition Education Note  Received consult for diet education for patient s/p bariatric surgery. She is POD #1 hiatal hernia repair and conversion of sleeve gastrectomy to roux-en-Y gastric bypass.  Discussed 2 week post op diet with pt. Emphasized that liquids must be non carbonated, non caffeinated, and sugar free. Fluid goals discussed. Pt to follow up with outpatient bariatric RD for further diet progression after 2 weeks. Multivitamins and minerals also reviewed. Teach back method used, pt expressed understanding, expect good compliance.  Patient has purchased several brands of protein shakes and protein waters, protein powder, and has purchased her multivitamin from the Outpatient Pharmacy. She has chocolate and caramel-flavored chews to take TID for calcium supplementation.   If nutrition issues arise, please consult RD.     Jarome Matin, MS, RD, LDN, CNSC Inpatient Clinical Dietitian RD pager # available in [place]  After hours/weekend pager # available in [facility]

## 2021-06-29 NOTE — Discharge Summary (Signed)
Physician Discharge Summary  SUSI GOSLIN VQQ:[id number] DOB: [birth year] DOA: 06/26/2021  PCP: Pablo Lawrence, NP  Admit date: 06/26/2021 Discharge date: 06/27/2021  Recommendations for Outpatient Follow-up:     Follow-up Information     [name], MD. Go on 07/20/2021.   Specialty: General Surgery Why: at 11:00am.  Please arrive 15 minutes prior to your appointment time. Thank you. Contact information: [address] [place] [address] [phone]         Carlena Hurl, PA-C. Go on 08/17/2021.   Specialty: General Surgery Why: at 9:30am for Dr. Redmond Pulling.  Please arrive 15 minutes prior to your appointment time. Thank you. Contact information: [place] [address] [phone]                Discharge Diagnoses:  Active Problems:   S/P gastric bypass Refractory GERD Recurrent hiatal hernia H/o laparoscopic sleeve gastrectomy with sliding hiatal hernia repair 09/2015 Obesity BMI 39  Surgical Procedure: Laparoscopic Repair of Recurrent Hiatal Hernia, laparoscopic conversion of sleeve gastrectomy to Roux-en-Y gastric bypass (ante-colic, ante-gastric); upper endoscopy; laparoscopic lysis of adhesions, laparoscopic TAP block  Discharge Condition: Good Disposition: Home  Diet recommendation: Postoperative gastric bypass diet  Filed Weights   06/26/21 0607 06/26/21 0621  Weight: 99.3 kg 100.7 kg     Hospital Course:  The patient was admitted for a planned conversion of's laparoscopic sleeve gastrectomy to laparoscopic Roux-en-Y gastric bypass possible hiatal hernia repair.  please see operative note. Preoperatively the patient was given 5000 units of subcutaneous heparin for DVT prophylaxis. ERAS protocol was used. Postoperative prophylactic Lovenox dosing was started on the evening of postoperative day 0.  The patient was started on ice chips and water on the evening of POD 0 which they tolerated. On postoperative day 1  The patient's diet was advanced to protein shakes which they also tolerated. On POD 1, The patient was ambulating without difficulty. Their vital signs are stable without fever or tachycardia. Their hemoglobin had remained stable.  I felt that the bump in her transaminases was due to the left lobe of the liver being retracted.  And we will repeat her LFTs as an outpatient. the patient had received discharge instructions and counseling. They were deemed stable for discharge.  BP 110/69 (BP Location: Left Arm)   Pulse 82   Temp 98.4 F (36.9 C) (Oral)   Resp 15   Ht 5\' 3"  (1.6 m)   Wt 100.7 kg   SpO2 94%   BMI 39.33 kg/m   Gen: alert, NAD, non-toxic appearing Pupils: equal, no scleral icterus Pulm: Lungs clear to auscultation, symmetric chest rise CV: regular rate and rhythm Abd: soft, min tender, nondistended. No cellulitis. No incisional hernia Ext: no edema, no calf tenderness Skin: no rash, no jaundice  Discharge Instructions  Discharge Instructions     Ambulate hourly while awake   Complete by: As directed    Call MD for:  difficulty breathing, headache or visual disturbances   Complete by: As directed    Call MD for:  persistant dizziness or light-headedness   Complete by: As directed    Call MD for:  persistant nausea and vomiting   Complete by: As directed    Call MD for:  redness, tenderness, or signs of infection (pain, swelling, redness, odor or green/yellow discharge around incision site)   Complete by: As directed    Call MD for:  severe uncontrolled pain   Complete by: As directed  Call MD for:  temperature >101 F   Complete by: As directed    Diet bariatric full liquid   Complete by: As directed    Discharge instructions   Complete by: As directed    See bariatric discharge instructions   Incentive spirometry   Complete by: As directed    Perform hourly while awake      Allergies as of 06/27/2021       Reactions   Ampicillin Anaphylaxis   Has  patient had a PCN reaction causing immediate rash, facial/tongue/throat swelling, SOB or lightheadedness with hypotension: Yes Has patient had a PCN reaction causing severe rash involving mucus membranes or skin necrosis: Yes Has patient had a PCN reaction that required hospitalization Yes Has patient had a PCN reaction occurring within the last 10 years: No If all of the above answers are "NO", then may proceed with Cephalosporin use.   Codeine Hives, Shortness Of Breath   Tolerates morphine, fentanyl, hydromorphone, hydrocodone, and oxycodone.   Sulfa Antibiotics Itching, Rash   Sulfonamide Derivatives Itching, Rash        Medication List     TAKE these medications    acetaminophen 500 MG tablet Commonly known as: TYLENOL Take 2 tablets (1,000 mg total) by mouth every 8 (eight) hours for 5 days.   albuterol 108 (90 Base) MCG/ACT inhaler Commonly known as: VENTOLIN HFA Inhale 1-2 puffs into the lungs every 6 (six) hours as needed for wheezing or shortness of breath.   buPROPion 200 MG 12 hr tablet Commonly known as: WELLBUTRIN SR Take 200 mg by mouth 2 (two) times daily.   cyanocobalamin 1000 MCG/ML injection Commonly known as: (VITAMIN B-12) Inject 1,000 mcg into the muscle every 30 (thirty) days.   dexlansoprazole 60 MG capsule Commonly known as: DEXILANT Take 1 capsule (60 mg total) by mouth daily before breakfast. What changed: when to take this   levocetirizine 5 MG tablet Commonly known as: XYZAL Take 5 mg by mouth at bedtime.   levothyroxine 112 MCG tablet Commonly known as: SYNTHROID Take 112 mcg by mouth daily before breakfast.   ondansetron 4 MG disintegrating tablet Commonly known as: ZOFRAN-ODT Dissolve 1 tablet (4 mg total) by mouth every 6 (six) hours as needed for nausea or vomiting.   traMADol 50 MG tablet Commonly known as: ULTRAM Take 1 tablet (50 mg total) by mouth every 6 (six) hours as needed (pain).        Follow-up Information      [name], MD. Go on 07/20/2021.   Specialty: General Surgery Why: at 11:00am.  Please arrive 15 minutes prior to your appointment time. Thank you. Contact information: [address] [place] [address] [phone]         Carlena Hurl, PA-C. Go on 08/17/2021.   Specialty: General Surgery Why: at 9:30am for Dr. Redmond Pulling.  Please arrive 15 minutes prior to your appointment time. Thank you. Contact information: [address] [place] [address] [phone]                  The results of significant diagnostics from this hospitalization (including imaging, microbiology, ancillary and laboratory) are listed below for reference.    Significant Diagnostic Studies: No results found.  Labs: Basic Metabolic Panel: Recent Labs  Lab 06/27/21 0414  NA 140  K 4.1  CL 104  CO2 26  GLUCOSE 120*  BUN 10  CREATININE 0.64  CALCIUM 9.1   Liver Function Tests:  Recent Labs  Lab 06/27/21 0414  AST 252*  ALT 248*  ALKPHOS 91  BILITOT 0.4  PROT 6.7  ALBUMIN 3.8    CBC: Recent Labs  Lab 06/26/21 1118 06/27/21 0414  WBC  --  8.4  NEUTROABS  --  6.1  HGB 13.1 13.1  HCT 39.4 40.0  MCV  --  90.1  PLT  --  175    CBG: Recent Labs  Lab 06/26/21 0558 06/26/21 1107  GLUCAP 80 107*    Active Problems:   S/P gastric bypass   Time coordinating discharge: 20 min  Signed:  [name], MD [facility], [place] [phone] 06/29/2021, 9:33 AM

## 2021-07-02 ENCOUNTER — Telehealth (HOSPITAL_COMMUNITY): Payer: Self-pay | Admitting: *Deleted

## 2021-07-02 DIAGNOSIS — Z9884 Bariatric surgery status: Secondary | ICD-10-CM | POA: Diagnosis not present

## 2021-07-02 DIAGNOSIS — Z8719 Personal history of other diseases of the digestive system: Secondary | ICD-10-CM | POA: Diagnosis not present

## 2021-07-02 DIAGNOSIS — Z9889 Other specified postprocedural states: Secondary | ICD-10-CM | POA: Diagnosis not present

## 2021-07-02 NOTE — Telephone Encounter (Signed)
1.  Tell me about your pain and pain management? Pt denies any pain.  2.  Let's talk about fluid intake.  How much total fluid are you taking in? Pt states that she is getting in more than 64oz of fluid (pt estimates around 100 fl oz or more) including protein shakes, bottled water, decaf coffee, yogurt, chicken broth and jello.  3.  How much protein have you taken in the last 2 days? Pt states she is meeting her goal of 60g of protein each day with the protein shakes and protein yogurt.  Pt also states that she was experiencing a lot of "hunger pains", so she consumed two soft scrambled eggs.  Pt denies any pain, nausea or vomiting since consuming eggs.  Pt encouraged to stay on liquid diet until next meeting with dietician on 10/25.  4.  Have you had nausea?  Tell me about when have experienced nausea and what you did to help? Pt currently denies nausea.  Pt has taken medication which does alleviate symptoms.   5.  Has the frequency or color changed with your urine? Pt states that she is urinating "frequently".  6.  Tell me what your incisions look like? "Incisions look fine". Pt denies a fever, chills.  Pt states incisions are not swollen, open, or draining.  Pt encouraged to call [facility] if incisions change.   7.  Have you been passing gas? BM? Pt states that she is having BMs. Last BM 07/02/21.  Pt states that she has had some "small liquid stools".  We discussed pt decreasing amount of coffee consumed to see if that makes a difference.  8.  If a problem or question were to arise who would you call?  Do you know contact numbers for [place], [facility], and [facility]? Pt denies dehydration symptoms.  Pt can describe s/sx of dehydration.  Pt knows to call [facility] for surgical, [facility] for nutrition, and [place] [facility] for non-urgent questions or concerns.   9.  How has the walking going? Pt states she is walking around and able to be active without difficulty.   10. Are you still using your incentive spirometer?  If so, how  often? Pt states that she does I.S. about 10x a day. Pt encouraged to use incentive spirometer, at least 10x every hour while awake until she sees the surgeon.  11.  How are your vitamins and calcium going?  How are you taking them? Pt states that she is taking her supplements and vitamins without difficulty.  Reminded patient that the first 30 days post-operatively are important for successful recovery.  Practice good hand hygiene, wearing a mask when appropriate (since optional in most places), and minimizing exposure to people who live outside of the home, especially if they are exhibiting any respiratory, GI, or illness-like symptoms.

## 2021-07-10 ENCOUNTER — Encounter: Payer: Medicare Other | Attending: General Surgery | Admitting: Skilled Nursing Facility1

## 2021-07-10 ENCOUNTER — Other Ambulatory Visit: Payer: Self-pay

## 2021-07-10 DIAGNOSIS — E1165 Type 2 diabetes mellitus with hyperglycemia: Secondary | ICD-10-CM | POA: Insufficient documentation

## 2021-07-10 DIAGNOSIS — E669 Obesity, unspecified: Secondary | ICD-10-CM | POA: Insufficient documentation

## 2021-07-11 NOTE — Progress Notes (Signed)
2 Week Post-Operative Nutrition Class   Patient was seen on 07/10/2021 for Post-Operative Nutrition education at [facility].     Surgery date: 06/26/2021 Surgery type: sleeve to RYGB Start weight at [facility]: 214.7 Weight today: 213.6 pounds Bowel Habits: Every day to every other day no complaints   Body Composition Scale 07/10/2021  Current Body Weight 213.6  Total Body Fat % 42.2  Visceral Fat 14  Fat-Free Mass % 57.7   Total Body Water % 43.3  Muscle-Mass lbs 30.3  BMI 37.4  Body Fat Displacement          Torso  lbs 55.8         Left Leg  lbs 11.1         Right Leg  lbs 11.1         Left Arm  lbs 5.5         Right Arm   lbs 5.5      The following the learning objectives were met by the patient during this course: Identifies Phase 3 (Soft, High Proteins) Dietary Goals and will begin from 2 weeks post-operatively to 2 months post-operatively Identifies appropriate sources of fluids and proteins  Identifies appropriate fat sources and healthy verses unhealthy fat types   States protein recommendations and appropriate sources post-operatively Identifies the need for appropriate texture modifications, mastication, and bite sizes when consuming solids Identifies appropriate fat consumption and sources Identifies appropriate multivitamin and calcium sources post-operatively Describes the need for physical activity post-operatively and will follow MD recommendations States when to call healthcare provider regarding medication questions or post-operative complications   Handouts given during class include: Phase 3A: Soft, High Protein Diet Handout Phase 3 High Protein Meals Healthy Fats   Follow-Up Plan: Patient will follow-up at [facility] in 6 weeks for 2 month post-op nutrition visit for diet advancement per MD.

## 2021-07-13 DIAGNOSIS — Z9884 Bariatric surgery status: Secondary | ICD-10-CM | POA: Diagnosis not present

## 2021-07-13 DIAGNOSIS — R6883 Chills (without fever): Secondary | ICD-10-CM | POA: Diagnosis not present

## 2021-07-13 DIAGNOSIS — R5383 Other fatigue: Secondary | ICD-10-CM | POA: Diagnosis not present

## 2021-07-16 ENCOUNTER — Telehealth: Payer: Self-pay | Admitting: Skilled Nursing Facility1

## 2021-07-16 NOTE — Telephone Encounter (Signed)
RD called pt to verify fluid intake once starting soft, solid proteins 2 week post-bariatric surgery.   Daily Fluid intake: 60 oz Daily Protein intake: 60 g Bowel Habits: every day to every other day  Concerns/issues:   Pt states she is struggling stating she was sick thinking she got a bug.  Pt states she can tolerate food inclusive of beef and chicken but cannot tolerate protein shakes or yogurt.  Pt sates she does not want to complaining: Dietitian advised it is not complaining it is just describing so I can help you.   Pt states water is not her friend but when she adds protein powder and other flavorings it is okay. Pt states alkaline is tolerable.   Pt states is doing okay but frustrated feeling exhausted.

## 2021-07-18 ENCOUNTER — Other Ambulatory Visit: Payer: Self-pay | Admitting: General Surgery

## 2021-07-18 ENCOUNTER — Encounter: Payer: Self-pay | Admitting: Hematology and Oncology

## 2021-07-19 ENCOUNTER — Other Ambulatory Visit: Payer: Self-pay

## 2021-07-19 ENCOUNTER — Ambulatory Visit (INDEPENDENT_AMBULATORY_CARE_PROVIDER_SITE_OTHER): Payer: Medicare Other

## 2021-07-19 VITALS — BP 114/79 | HR 65 | Temp 97.5°F | Resp 18 | Ht 63.0 in | Wt 210.4 lb

## 2021-07-19 DIAGNOSIS — E860 Dehydration: Secondary | ICD-10-CM | POA: Diagnosis not present

## 2021-07-19 MED ORDER — SODIUM CHLORIDE 0.9 % IV BOLUS
1000.0000 mL | Freq: Once | INTRAVENOUS | Status: AC
  Administered 2021-07-19: 1000 mL via INTRAVENOUS
  Filled 2021-07-19: qty 1000

## 2021-07-19 MED ORDER — THIAMINE HCL 100 MG/ML IJ SOLN
Freq: Once | INTRAVENOUS | Status: AC
  Filled 2021-07-19: qty 1000

## 2021-07-19 MED ORDER — ONDANSETRON HCL 4 MG/2ML IJ SOLN: 4.0000 mg | INTRAMUSCULAR | Status: DC | PRN

## 2021-07-19 MED ORDER — ONDANSETRON 4 MG PO TBDP: 4.0000 mg | ORAL_TABLET | ORAL | Status: DC | PRN

## 2021-07-19 NOTE — Progress Notes (Signed)
Diagnosis: Fluid Replacment  Provider:  Marshell Garfinkel, MD  Procedure: Infusion  IV Type: Peripheral, IV Location: R Forearm  1L NS, 1L Banana bag  Infusion Start Time: 0388 and 0945  Infusion Stop Time: 1000 and 1100  Post Infusion IV Care: Observation period completed  Discharge: Condition: Good, Destination: Home . AVS provided to patient.   Performed by:  Paul Dykes, RN

## 2021-07-19 NOTE — Progress Notes (Deleted)
Diagnosis: {Diagnosis:25401}  Provider:  Praveen Mannam, MD  Procedure: {Injection/Infusion:25339}  {IV Type:25393::"Peripheral"}, {IV Location:25394}  {Medications:25395}, {Dose:25397::"30 mg"}  {Infusion Start Time:25399}  {Infusion Stop Time:25400}  Post Infusion IV Care: {CHINF Post Infusion:25398}  Discharge: {Condition:19696:::1}, {Destination:18313::"Home":1} . AVS provided to patient.   Performed by:  Bryton Romagnoli N Marygrace Sandoval, RN    

## 2021-07-20 ENCOUNTER — Ambulatory Visit (INDEPENDENT_AMBULATORY_CARE_PROVIDER_SITE_OTHER): Payer: Medicare Other

## 2021-07-20 ENCOUNTER — Other Ambulatory Visit: Payer: Self-pay | Admitting: General Surgery

## 2021-07-20 ENCOUNTER — Encounter: Payer: Self-pay | Admitting: Internal Medicine

## 2021-07-20 VITALS — BP 120/81 | HR 66 | Temp 97.7°F | Resp 17 | Ht 63.0 in | Wt 214.0 lb

## 2021-07-20 DIAGNOSIS — R6889 Other general symptoms and signs: Secondary | ICD-10-CM | POA: Diagnosis not present

## 2021-07-20 DIAGNOSIS — E860 Dehydration: Secondary | ICD-10-CM | POA: Diagnosis not present

## 2021-07-20 DIAGNOSIS — J0100 Acute maxillary sinusitis, unspecified: Secondary | ICD-10-CM | POA: Diagnosis not present

## 2021-07-20 DIAGNOSIS — Z20822 Contact with and (suspected) exposure to covid-19: Secondary | ICD-10-CM | POA: Diagnosis not present

## 2021-07-20 MED ORDER — ONDANSETRON 4 MG PO TBDP: 4.0000 mg | ORAL_TABLET | ORAL | Status: DC | PRN

## 2021-07-20 MED ORDER — ONDANSETRON HCL 4 MG/2ML IJ SOLN: 4.0000 mg | INTRAMUSCULAR | Status: DC | PRN

## 2021-07-20 MED ORDER — SODIUM CHLORIDE 0.9 % IV BOLUS
2000.0000 mL | Freq: Once | INTRAVENOUS | Status: AC
  Administered 2021-07-20 (×2): 2000 mL via INTRAVENOUS
  Filled 2021-07-20: qty 2000

## 2021-07-20 NOTE — Progress Notes (Signed)
Diagnosis: Gastric Bipass  Provider:  Marshell Garfinkel, MD  Procedure: Infusion  IV Type: Peripheral, IV Location: L Antecubital  N/S, Dose: 2038ml  Infusion Start Time: 13.29 07/20/2021  Infusion Stop Time: 15.38 07/20/2021  Post Infusion IV Care: Peripheral IV Discontinued  Discharge: Condition: Good, Destination: Home . AVS provided to patient.   Performed by:  Arnoldo Morale, RN

## 2021-07-23 ENCOUNTER — Ambulatory Visit (INDEPENDENT_AMBULATORY_CARE_PROVIDER_SITE_OTHER): Payer: Medicare Other

## 2021-07-23 ENCOUNTER — Other Ambulatory Visit: Payer: Self-pay | Admitting: General Surgery

## 2021-07-23 ENCOUNTER — Other Ambulatory Visit: Payer: Self-pay

## 2021-07-23 ENCOUNTER — Telehealth (HOSPITAL_COMMUNITY): Payer: Self-pay | Admitting: *Deleted

## 2021-07-23 VITALS — BP 110/69 | HR 79 | Temp 97.9°F | Resp 16 | Ht 63.0 in | Wt 208.6 lb

## 2021-07-23 DIAGNOSIS — E860 Dehydration: Secondary | ICD-10-CM | POA: Diagnosis not present

## 2021-07-23 MED ORDER — THIAMINE HCL 100 MG/ML IJ SOLN
Freq: Once | INTRAVENOUS | Status: AC
  Filled 2021-07-23: qty 1000

## 2021-07-23 MED ORDER — ONDANSETRON 4 MG PO TBDP: 4.0000 mg | ORAL_TABLET | ORAL | Status: DC | PRN

## 2021-07-23 MED ORDER — ONDANSETRON HCL 4 MG/2ML IJ SOLN: 4.0000 mg | INTRAMUSCULAR | Status: DC | PRN

## 2021-07-23 MED ORDER — SODIUM CHLORIDE 0.9 % IV BOLUS
1000.0000 mL | Freq: Once | INTRAVENOUS | Status: AC
  Administered 2021-07-23: 1000 mL via INTRAVENOUS
  Filled 2021-07-23: qty 1000

## 2021-07-23 NOTE — Telephone Encounter (Signed)
Called pt to f/u per MD request.   Pt is at work today. Pt states that she went to PCP and test negative for flu and COVID but still feels lethargic and achy.  Denies fever and chills.  Pt states that after IVF, felt better but has thrown up twice on Saturday and twice on Sunday around/after 3pm.  She feels that food "gets to a point and then comes back up". Pt states that she has not met her fluid or protein goal over the weekend.  Pt states that the Carafate helps some but "wears off" as she drinks more fluids and still c/o pain/discomfort while drinking liquids. Pt states that she is taking the Dexilant as instructed by Dr. Redmond Pulling at night. Pt has needed to take Zofran q6h and phenergan at night for nausea because she is sensitive to smells also.  MD made aware.  Will f/u as needed.

## 2021-07-23 NOTE — Progress Notes (Signed)
Diagnosis: Dehydration  Provider:  Marshell Garfinkel, MD  Procedure: Infusion  IV Type: Peripheral, IV Location: L Forearm  sodium chloride 0.9 % 1,000 mL with thiamine 355 mg, folic acid 1 mg, M.V.I. Adult 10 mL infusion and sodium chloride 0.9 % bolus 1,000 mL  , Dose: 2 Liters  Infusion Start Time: 13.19 07/23/2021  Infusion Stop Time: 15.29 07/23/2021  Post Infusion IV Care: Peripheral IV Discontinued  Discharge: Condition: Good, Destination: Home . AVS provided to patient.   Performed by:  Arnoldo Morale, RN

## 2021-07-26 ENCOUNTER — Other Ambulatory Visit: Payer: Self-pay | Admitting: General Surgery

## 2021-07-26 ENCOUNTER — Other Ambulatory Visit: Payer: Self-pay

## 2021-07-26 ENCOUNTER — Other Ambulatory Visit (HOSPITAL_COMMUNITY): Payer: Self-pay

## 2021-07-26 DIAGNOSIS — E860 Dehydration: Secondary | ICD-10-CM

## 2021-07-26 DIAGNOSIS — Z20822 Contact with and (suspected) exposure to covid-19: Secondary | ICD-10-CM | POA: Diagnosis not present

## 2021-07-26 DIAGNOSIS — R6889 Other general symptoms and signs: Secondary | ICD-10-CM | POA: Diagnosis not present

## 2021-07-26 MED ORDER — FAMOTIDINE IN NACL 20-0.9 MG/50ML-% IV SOLN
20.0000 mg | Freq: Once | INTRAVENOUS | Status: DC
  Filled 2021-07-26: qty 50

## 2021-07-26 MED ORDER — SODIUM CHLORIDE 0.9 % IV BOLUS
1000.0000 mL | Freq: Once | INTRAVENOUS | Status: DC
  Filled 2021-07-26: qty 1000

## 2021-07-26 MED ORDER — THIAMINE HCL 100 MG/ML IJ SOLN
Freq: Once | INTRAVENOUS | Status: DC
  Filled 2021-07-26 (×2): qty 1000

## 2021-07-27 ENCOUNTER — Other Ambulatory Visit: Payer: Self-pay | Admitting: General Surgery

## 2021-07-27 ENCOUNTER — Ambulatory Visit (INDEPENDENT_AMBULATORY_CARE_PROVIDER_SITE_OTHER): Payer: Medicare Other

## 2021-07-27 ENCOUNTER — Other Ambulatory Visit: Payer: Self-pay

## 2021-07-27 DIAGNOSIS — Z9884 Bariatric surgery status: Secondary | ICD-10-CM | POA: Diagnosis not present

## 2021-07-27 DIAGNOSIS — E860 Dehydration: Secondary | ICD-10-CM | POA: Diagnosis not present

## 2021-07-27 MED ORDER — FAMOTIDINE IN NACL 20-0.9 MG/50ML-% IV SOLN
20.0000 mg | Freq: Two times a day (BID) | INTRAVENOUS | Status: DC
  Administered 2021-07-27: 20 mg via INTRAVENOUS

## 2021-07-27 MED ORDER — SODIUM CHLORIDE 0.9 % IV BOLUS
1000.0000 mL | Freq: Once | INTRAVENOUS | Status: AC
  Administered 2021-07-27: 1000 mL via INTRAVENOUS
  Filled 2021-07-27: qty 1000

## 2021-07-27 MED ORDER — THIAMINE HCL 100 MG/ML IJ SOLN
Freq: Once | INTRAVENOUS | Status: AC
  Filled 2021-07-27: qty 1000

## 2021-07-27 NOTE — Progress Notes (Signed)
Diagnosis: Dehydration  Provider:  Marshell Garfinkel, MD  Procedure: Infusion  IV Type: Peripheral, IV Location: L Forearm  sodium chloride 0.9 % 1,000 mL with thiamine 737 mg, folic acid 1 mg, M.V.I. Adult 10 mL infusion    , Dose: 1067ml  Infusion Start Time: 0916 07/27/2021  Infusion Stop Time: 10.25 07/27/2021  Post Infusion IV Care: Peripheral IV Discontinued  Discharge: Condition: Good, Destination: Home . AVS provided to patient.   Performed by:  Rontae Inglett, RN    Normal Saline, Dose: 1000 ml  Infusion Start Time: 10.2911/07/2021  Infusion Stop Time: 11.30 07/27/2021    Pepcid (Famotidine), Dose: 20 mg  Infusion Start Time: 11.33 07/27/2021  Infusion Stop Time: 12.11 pm 07/27/2021

## 2021-07-31 ENCOUNTER — Ambulatory Visit
Admission: RE | Admit: 2021-07-31 | Discharge: 2021-07-31 | Disposition: A | Discharge status: Home / Self Care | Payer: Medicare Other | Source: Ambulatory Visit | Attending: General Surgery | Admitting: General Surgery

## 2021-07-31 ENCOUNTER — Other Ambulatory Visit: Payer: Self-pay | Admitting: General Surgery

## 2021-07-31 DIAGNOSIS — K219 Gastro-esophageal reflux disease without esophagitis: Secondary | ICD-10-CM | POA: Diagnosis not present

## 2021-07-31 DIAGNOSIS — R1110 Vomiting, unspecified: Secondary | ICD-10-CM | POA: Diagnosis not present

## 2021-07-31 DIAGNOSIS — Z9884 Bariatric surgery status: Secondary | ICD-10-CM

## 2021-08-07 ENCOUNTER — Ambulatory Visit: Payer: Medicare Other | Admitting: Skilled Nursing Facility1

## 2021-08-08 ENCOUNTER — Other Ambulatory Visit: Payer: Self-pay

## 2021-08-08 ENCOUNTER — Encounter: Payer: Medicare Other | Attending: General Surgery | Admitting: Skilled Nursing Facility1

## 2021-08-08 DIAGNOSIS — E1165 Type 2 diabetes mellitus with hyperglycemia: Secondary | ICD-10-CM | POA: Diagnosis not present

## 2021-08-08 NOTE — Progress Notes (Signed)
Bariatric Nutrition Follow-Up Visit Medical Nutrition Therapy    NUTRITION ASSESSMENT    Surgery date: 06/26/2021 Surgery type: sleeve to RYGB Start weight at [facility]: 214.7 Weight today: 205.1 pounds   Body Composition Scale 07/10/2021 08/08/2021  Current Body Weight 213.6 205.1  Total Body Fat % 42.2 41.2  Visceral Fat 14 13  Fat-Free Mass % 57.7 58.7   Total Body Water % 43.3 43.8  Muscle-Mass lbs 30.3 30.2  BMI 37.4 36  Body Fat Displacement           Torso  lbs 55.8 52.3         Left Leg  lbs 11.1 10.4         Right Leg  lbs 11.1 10.4         Left Arm  lbs 5.5 5.2         Right Arm   lbs 5.5 5.2   Clinical  Medical hx: GERD Medications: B 12, vitamin D Labs: B 12 530, A1C 5.5 Notable signs/symptoms:  Any previous deficiencies? anemia   Lifestyle & Dietary Hx  Pt states she started protonix instead of dexilant.   Pt state she is much better now with no issues able to tolerate food and beverage.  Pt states seh does have a bike that she will start riding and has a gym at her apartment.   Estimated daily fluid intake: 50+ oz Estimated daily protein intake: 80 g Supplements: multi and calcium Current average weekly physical activity: ADL's   24-Hr Dietary Recall First Meal: [place] sausage + egg or cheese stick Snack: cheese stick or [place] sausage Second Meal: deli meat or chili Snack:  cashews or tuna pack Third Meal: chili Snack:  Beverages: water, water + flavoring, unsweet tea, 1 coffee, sometimes protein shake  Post-Op Goals/ Signs/ Symptoms Using straws: no Drinking while eating: no Chewing/swallowing difficulties: no Changes in vision: no Changes to mood/headaches: no Hair loss/changes to skin/nails: no Difficulty focusing/concentrating: no Sweating: no Limb weakness: no Dizziness/lightheadedness: no Palpitations: no  Carbonated/caffeinated beverages: no N/V/D/C/Gas: taking mirilax in apple cider vinegar daily Abdominal pain: no Dumping  syndrome: no    NUTRITION DIAGNOSIS  Overweight/obesity ([place]-3.3) related to past poor dietary habits and physical inactivity as evidenced by completed bariatric surgery and following dietary guidelines for continued weight loss and healthy nutrition status.     NUTRITION INTERVENTION Nutrition counseling (C-1) and education (E-2) to facilitate bariatric surgery goals, including: Diet advancement to the next phase (phase 4) now including non starchy vegetables  The importance of consuming adequate calories as well as certain nutrients daily due to the body's need for essential vitamins, minerals, and fats The importance of daily physical activity and to reach a goal of at least 150 minutes of moderate to vigorous physical activity weekly (or as directed by their physician) due to benefits such as increased musculature and improved lab values The importance of intuitive eating specifically learning hunger-satiety cues and understanding the importance of learning a new body: The importance of mindful eating to avoid grazing behaviors   Goals: -Continue to aim for a minimum of 64 fluid ounces 7 days a week with at least 30 ounces being plain water  -Eat non-starchy vegetables 2 times a day 7 days a week  -Start out with soft cooked vegetables today and tomorrow; if tolerated begin to eat raw vegetables or cooked including salads  -Eat your 3 ounces of protein first then start in on your non-starchy vegetables; once you understand how  much of your meal leads to satisfaction and not full while still eating 3 ounces of protein and non-starchy vegetables you can eat them in any order   -Continue to aim for 30 minutes of activity at least 5 times a week  -Do NOT cook with/add to your food: alfredo sauce, cheese sauce, barbeque sauce, ketchup, fat back, butter, bacon grease, grease, Crisco, OR SUGAR   Handouts Provided Include  Phase 4  Learning Style & Readiness for Change Teaching method  utilized: Visual & Auditory  Demonstrated degree of understanding via: Teach Back  Readiness Level: Action Barriers to learning/adherence to lifestyle change: none identified  RD's Notes for Next Visit Assess adherence to pt chosen goals    MONITORING & EVALUATION Dietary intake, weekly physical activity, body weight,  Next Steps Patient is to follow-up in 3-4 months

## 2021-08-14 NOTE — Progress Notes (Signed)
Diagnosis: Fluid Replacment  Provider:  Marshell Garfinkel, MD  Procedure: Infusion  IV Type: Peripheral, IV Location: R Forearm  1L NS, 1L Banana bag  Infusion Start Time: 8343 and 0945  Infusion Stop Time: 1000 and 1100  Post Infusion IV Care: Observation period completed  Discharge: Condition: Good, Destination: Home . AVS provided to patient.   Performed by:  Koren Shiver, RN

## 2021-08-30 DIAGNOSIS — Z23 Encounter for immunization: Secondary | ICD-10-CM | POA: Diagnosis not present

## 2021-08-30 DIAGNOSIS — E039 Hypothyroidism, unspecified: Secondary | ICD-10-CM | POA: Diagnosis not present

## 2021-08-30 DIAGNOSIS — E538 Deficiency of other specified B group vitamins: Secondary | ICD-10-CM | POA: Diagnosis not present

## 2021-08-30 DIAGNOSIS — E1169 Type 2 diabetes mellitus with other specified complication: Secondary | ICD-10-CM | POA: Diagnosis not present

## 2021-08-30 DIAGNOSIS — D508 Other iron deficiency anemias: Secondary | ICD-10-CM | POA: Diagnosis not present

## 2021-08-30 DIAGNOSIS — F331 Major depressive disorder, recurrent, moderate: Secondary | ICD-10-CM | POA: Insufficient documentation

## 2021-08-30 DIAGNOSIS — E785 Hyperlipidemia, unspecified: Secondary | ICD-10-CM | POA: Diagnosis not present

## 2021-08-30 DIAGNOSIS — I10 Essential (primary) hypertension: Secondary | ICD-10-CM | POA: Diagnosis not present

## 2021-09-07 ENCOUNTER — Emergency Department (HOSPITAL_BASED_OUTPATIENT_CLINIC_OR_DEPARTMENT_OTHER): Payer: Medicare Other

## 2021-09-07 ENCOUNTER — Encounter (HOSPITAL_BASED_OUTPATIENT_CLINIC_OR_DEPARTMENT_OTHER): Payer: Self-pay

## 2021-09-07 ENCOUNTER — Emergency Department (HOSPITAL_BASED_OUTPATIENT_CLINIC_OR_DEPARTMENT_OTHER)
Admission: EM | Admit: 2021-09-07 | Discharge: 2021-09-07 | Disposition: A | Discharge status: Home / Self Care | Payer: Medicare Other | Attending: Emergency Medicine | Admitting: Emergency Medicine

## 2021-09-07 ENCOUNTER — Other Ambulatory Visit: Payer: Self-pay

## 2021-09-07 DIAGNOSIS — E119 Type 2 diabetes mellitus without complications: Secondary | ICD-10-CM | POA: Diagnosis not present

## 2021-09-07 DIAGNOSIS — Z79899 Other long term (current) drug therapy: Secondary | ICD-10-CM | POA: Diagnosis not present

## 2021-09-07 DIAGNOSIS — J45909 Unspecified asthma, uncomplicated: Secondary | ICD-10-CM | POA: Diagnosis not present

## 2021-09-07 DIAGNOSIS — I2510 Atherosclerotic heart disease of native coronary artery without angina pectoris: Secondary | ICD-10-CM | POA: Insufficient documentation

## 2021-09-07 DIAGNOSIS — R1031 Right lower quadrant pain: Secondary | ICD-10-CM | POA: Diagnosis not present

## 2021-09-07 DIAGNOSIS — Z8541 Personal history of malignant neoplasm of cervix uteri: Secondary | ICD-10-CM | POA: Diagnosis not present

## 2021-09-07 DIAGNOSIS — R109 Unspecified abdominal pain: Secondary | ICD-10-CM | POA: Diagnosis not present

## 2021-09-07 DIAGNOSIS — E039 Hypothyroidism, unspecified: Secondary | ICD-10-CM | POA: Diagnosis not present

## 2021-09-07 DIAGNOSIS — I10 Essential (primary) hypertension: Secondary | ICD-10-CM | POA: Insufficient documentation

## 2021-09-07 LAB — URINALYSIS, ROUTINE W REFLEX MICROSCOPIC
Bilirubin Urine: NEGATIVE
Glucose, UA: NEGATIVE mg/dL
Hgb urine dipstick: NEGATIVE
Ketones, ur: NEGATIVE mg/dL
Nitrite: NEGATIVE
Protein, ur: NEGATIVE mg/dL
Specific Gravity, Urine: 1.021 (ref 1.005–1.030)
pH: 7 (ref 5.0–8.0)

## 2021-09-07 LAB — COMPREHENSIVE METABOLIC PANEL
ALT: 20 U/L (ref 0–44)
AST: 15 U/L (ref 15–41)
Albumin: 4.3 g/dL (ref 3.5–5.0)
Alkaline Phosphatase: 97 U/L (ref 38–126)
Anion gap: 8 (ref 5–15)
BUN: 9 mg/dL (ref 6–20)
CO2: 24 mmol/L (ref 22–32)
Calcium: 9.1 mg/dL (ref 8.9–10.3)
Chloride: 109 mmol/L (ref 98–111)
Creatinine, Ser: 0.63 mg/dL (ref 0.44–1.00)
GFR, Estimated: >60 mL/min (ref >60)
Glucose, Bld: 103 mg/dL — ABNORMAL HIGH (ref 70–99)
Potassium: 3.8 mmol/L (ref 3.5–5.1)
Sodium: 141 mmol/L (ref 135–145)
Total Bilirubin: 0.3 mg/dL (ref 0.3–1.2)
Total Protein: 6.5 g/dL (ref 6.5–8.1)

## 2021-09-07 LAB — CBC
HCT: 39.1 % (ref 36.0–46.0)
Hemoglobin: 13 g/dL (ref 12.0–15.0)
MCH: 29 pg (ref 26.0–34.0)
MCHC: 33.2 g/dL (ref 30.0–36.0)
MCV: 87.3 fL (ref 80.0–100.0)
Platelets: 172 10*3/uL (ref 150–400)
RBC: 4.48 MIL/uL (ref 3.87–5.11)
RDW: 13 % (ref 11.5–15.5)
WBC: 7.2 10*3/uL (ref 4.0–10.5)
nRBC: 0 % (ref 0.0–0.2)

## 2021-09-07 LAB — PREGNANCY, URINE: Preg Test, Ur: NEGATIVE

## 2021-09-07 LAB — LIPASE, BLOOD: Lipase: 31 U/L (ref 11–51)

## 2021-09-07 MED ORDER — IOHEXOL 300 MG/ML  SOLN
100.0000 mL | Freq: Once | INTRAMUSCULAR | Status: AC | PRN
  Administered 2021-09-07: 23:00:00 100 mL via INTRAVENOUS

## 2021-09-07 NOTE — ED Triage Notes (Signed)
Pt reports RLQ abdominal pain since Monday. She states that she recently had gastric sleeve to bypass revision in October 2022. Pt states that since Monday she has been able to feel a protrusion in the RLQ and has bruising to her R thigh.

## 2021-09-07 NOTE — ED Provider Notes (Addendum)
[facility] EMERGENCY DEPT Provider Note   CSN: [id number] Arrival date & time: 09/07/21  1705     History Chief Complaint  Patient presents with   Abdominal Pain    Kristina [name] is a 50 y.o. female.  Sent in by primary care provider.  Patient been having right lower quadrant right groin area intermittent pain since Monday.  No nausea no vomiting.  Sometimes worse with movement.  Some question of whether there is a hernia.  Patient's had gastric sleeve bypass revision in October 2022 but no epigastric abdominal pain.  There is some bruising to the right anterior thigh.  Patient's not sure how that got there.  Questionable of a knot in the area that comes and goes      Past Medical History:  Diagnosis Date   Allergic rhinitis    Anxiety    Aortic dilatation (HCC)    Asthma    Cervical cancer (HCC)    Chronic back pain    Chronic cough    Depression    Diverticulosis    Essential hypertension    GERD (gastroesophageal reflux disease)    History of bronchitis    History of pneumonia    Hx of adenomatous and sessile serrated colonic polyps    Hyperlipidemia    Hypothyroidism    Lumbosacral spondylosis without myelopathy    Bulging disc - chronic pain   Obesity    Bariatric surgery   Obstructive sleep apnea    PONV (postoperative nausea and vomiting)    Restless leg    Tachycardia    Type 2 diabetes mellitus ([place])    Vitamin D deficiency    Vocal cord dysfunction     Patient Active Problem List   Diagnosis Date Noted   S/P gastric bypass 06/26/2021   Iron deficiency anemia 02/22/2020   History of chest pain 06/07/2019   Dilated aortic root ([place]) 06/07/2019   Family history of early CAD 06/07/2019   Coronary artery disease involving native coronary artery of native heart with unstable angina pectoris ([place]) 05/17/2019   Lipodystrophy 08/09/2018   Neck pain 08/07/2018   Panniculitis 08/07/2018   Chronic bilateral thoracic back pain 08/07/2018    Degeneration of lumbar intervertebral disc 06/04/2018   Family history of colon cancer - brother 03/31/2018   Nausea vomiting and diarrhea 11/09/2015   Dehydration 11/09/2015   Hypokalemia 11/09/2015   Gastroenteritis 11/09/2015   S/P panniculectomy 09/19/2015   Preoperative cardiovascular examination 07/13/2015   Hx of adenomatous and sessile serrated colonic polyps    Allergic rhinitis, seasonal 08/25/2012   Adult hypothyroidism 08/25/2012   HLD (hyperlipidemia) 08/25/2012   Diabetes mellitus ([place]) 08/25/2012   Back pain, chronic 08/25/2012   Hypothyroid 08/25/2012   Anxiety 08/25/2012   Depression 08/25/2012   Diabetes ([place]) 08/25/2012   GERD (gastroesophageal reflux disease) 08/25/2012   Hyperlipidemia 08/25/2012   OSA (obstructive sleep apnea) 03/16/2012   Cough 01/30/2012   Asthma 01/30/2012   PALPITATIONS 08/15/2008   DYSPNEA 08/15/2008   Chest pain 08/15/2008   IRRITABLE BOWEL SYNDROME 02/26/2008   Hypercholesterolemia with hypertriglyceridemia 01/04/2008   Morbid obesity ([place]) 01/04/2008   Anxiety state 01/04/2008   DEPRESSION 01/04/2008   Essential hypertension 01/04/2008   GERD 01/04/2008   FATIGUE 01/04/2008   HEADACHE, CHRONIC 01/04/2008   GASTRITIS 04/02/2007    Past Surgical History:  Procedure Laterality Date   ABDOMINAL HYSTERECTOMY  1996   CHOLECYSTECTOMY  1994   COLONOSCOPY WITH PROPOFOL N/A 03/10/2015   Procedure:  COLONOSCOPY WITH PROPOFOL;  Surgeon: [name], MD;  Location: [facility];  Service: Endoscopy;  Laterality: N/A;   ESOPHAGOGASTRODUODENOSCOPY N/A 10/27/2013   Procedure: ESOPHAGOGASTRODUODENOSCOPY (EGD);  Surgeon: [name], MD;  Location: [facility];  Service: Endoscopy;  Laterality: N/A;   GASTRIC ROUX-EN-Y N/A 06/26/2021   Procedure: LAPAROSCOPIC ROUX-EN-Y GASTRIC BYPASS WITH UPPER ENDOSCOPY;  Surgeon: [name], MD;  Location: [facility];  Service: General;  Laterality: N/A;   HIATAL HERNIA REPAIR N/A 06/26/2021    Procedure: HERNIA REPAIR HIATAL;  Surgeon: [name], MD;  Location: [facility];  Service: General;  Laterality: N/A;   KNEE ARTHROSCOPY Right    LAPAROSCOPIC GASTRIC SLEEVE RESECTION WITH HIATAL HERNIA REPAIR N/A 09/19/2015   Procedure: LAPAROSCOPIC GASTRIC SLEEVE RESECTION WITH HIATAL HERNIA REPAIR;  Surgeon: [name], MD;  Location: [facility];  Service: General;  Laterality: N/A;   LEFT HEART CATH AND CORONARY ANGIOGRAPHY N/A 05/17/2019   Procedure: LEFT HEART CATH AND CORONARY ANGIOGRAPHY;  Surgeon: Jettie Booze, MD;  Location: [facility];  Service: Cardiovascular;  Laterality: N/A;   LIPOSUCTION N/A 11/26/2018   Procedure: LIPOSUCTION;  Surgeon: Wallace Going, DO;  Location: [facility];  Service: Plastics;  Laterality: N/A;   LIPOSUCTION N/A 11/02/2020   Procedure: with liposuction;  Surgeon: Wallace Going, DO;  Location: [facility];  Service: Plastics;  Laterality: N/A;   MASS EXCISION N/A 11/02/2020   Procedure: Excision of suprapubic area;  Surgeon: Wallace Going, DO;  Location: [facility];  Service: Plastics;  Laterality: N/A;  90 min   NOSE SURGERY  2004   ovaries removed     PANNICULECTOMY N/A 11/26/2018   Procedure: PANNICULECTOMY;  Surgeon: Wallace Going, DO;  Location: [facility];  Service: Plastics;  Laterality: N/A;   TUBAL LIGATION     UPPER GI ENDOSCOPY  09/19/2015   Procedure: UPPER GI ENDOSCOPY;  Surgeon: [name], MD;  Location: [facility];  Service: General;;     OB History     Gravida  5   Para  3   Term      Preterm      AB  2   Living         SAB  1   IAB  1   Ectopic      Multiple      Live Births              Family History  Problem Relation Age of Onset   Emphysema Mother    Allergies Mother    Heart disease Mother    Asthma Mother    Ovarian cancer Mother    Hypertension Mother    Diabetes Mother    High Cholesterol Mother    Stroke  Mother    Thyroid disease Mother    Depression Mother    Anxiety disorder Mother    Heart disease Father        deceased at age 65 from heart attack   Sudden death Father    High Cholesterol Father    High blood pressure Father    Stroke Maternal Grandmother    Heart disease Maternal Grandmother    Cancer Maternal Grandfather    Heart disease Paternal Grandmother    Multiple sclerosis Paternal Grandmother    Heart attack Paternal Grandfather    Thyroid disease Brother        died at 75   Heart attack Brother  died at 30   COPD Brother    Colon cancer Brother 38   Hyperthyroidism Sister    Hypertension Brother    Hypertension Daughter    Allergies Child    Asthma Son    Asthma Brother    Asthma Brother    Lung cancer Maternal Aunt    COPD Sister        died at 61   Sudden death Sister    Asthma Son     Social History   Tobacco Use   Smoking status: Never   Smokeless tobacco: Never  Vaping Use   Vaping Use: Never used  Substance Use Topics   Alcohol use: No   Drug use: No    Home Medications Prior to Admission medications   Medication Sig Start Date End Date Taking? Authorizing Provider  albuterol (VENTOLIN HFA) 108 (90 Base) MCG/ACT inhaler Inhale 1-2 puffs into the lungs every 6 (six) hours as needed for wheezing or shortness of breath.    [provider]  buPROPion (WELLBUTRIN SR) 200 MG 12 hr tablet Take 200 mg by mouth 2 (two) times daily. 05/21/19   [provider]  cyanocobalamin (,VITAMIN B-12,) 1000 MCG/ML injection Inject 1,000 mcg into the muscle every 30 (thirty) days. 11/12/19   [provider]  dexlansoprazole (DEXILANT) 60 MG capsule Take 1 capsule (60 mg total) by mouth daily before breakfast. Patient taking differently: Take 60 mg by mouth at bedtime. 04/06/18   [name], MD  levocetirizine (XYZAL) 5 MG tablet Take 5 mg by mouth at bedtime.    [provider]  levothyroxine (SYNTHROID) 112 MCG tablet  Take 112 mcg by mouth daily before breakfast. 11/24/19   [provider]  ondansetron (ZOFRAN-ODT) 4 MG disintegrating tablet Dissolve 1 tablet (4 mg total) by mouth every 6 (six) hours as needed for nausea or vomiting. 06/27/21   [name], MD  traMADol (ULTRAM) 50 MG tablet Take 1 tablet (50 mg total) by mouth every 6 (six) hours as needed (pain). 06/27/21   [name], MD    Allergies    Ampicillin, Codeine, Sulfa antibiotics, and Sulfonamide derivatives  Review of Systems   Review of Systems  Constitutional:  Negative for chills and fever.  HENT:  Negative for ear pain and sore throat.   Eyes:  Negative for pain and visual disturbance.  Respiratory:  Negative for cough and shortness of breath.   Cardiovascular:  Negative for chest pain and palpitations.  Gastrointestinal:  Positive for abdominal pain. Negative for constipation, nausea and vomiting.  Genitourinary:  Negative for dysuria and hematuria.  Musculoskeletal:  Negative for arthralgias and back pain.  Skin:  Negative for color change and rash.  Neurological:  Negative for seizures and syncope.  All other systems reviewed and are negative.  Physical Exam Updated Vital Signs BP 137/90    Pulse 70    Temp (!) 97.1 F (36.2 C)    Resp 15    Ht 1.6 m (5\' 3" )    Wt 88.9 kg    SpO2 95%    BMI 34.72 kg/m   Physical Exam Vitals and nursing note reviewed.  Constitutional:      General: She is not in acute distress.    Appearance: Normal appearance. She is well-developed.  HENT:     Head: Normocephalic and atraumatic.  Eyes:     Conjunctiva/sclera: Conjunctivae normal.     Pupils: Pupils are equal, round, and reactive to light.  Cardiovascular:  Rate and Rhythm: Normal rate and regular rhythm.     Heart sounds: No murmur heard. Pulmonary:     Effort: Pulmonary effort is normal. No respiratory distress.     Breath sounds: Normal breath sounds.  Abdominal:     General: There is no distension.      Palpations: Abdomen is soft.     Tenderness: There is no abdominal tenderness.     Hernia: No hernia is present.  Musculoskeletal:        General: No swelling.     Cervical back: Normal range of motion and neck supple.     Comments: Area of bruising to the right anterior thigh measuring about 3 x 4 cm.  Some of it yellow some of its dark.  Appears old.  Skin:    General: Skin is warm and dry.     Capillary Refill: Capillary refill takes less than 2 seconds.  Neurological:     General: No focal deficit present.     Mental Status: She is alert and oriented to person, place, and time.     Cranial Nerves: No cranial nerve deficit.     Sensory: No sensory deficit.     Motor: No weakness.  Psychiatric:        Mood and Affect: Mood normal.    ED Results / Procedures / Treatments   Labs (all labs ordered are listed, but only abnormal results are displayed) Labs Reviewed  COMPREHENSIVE METABOLIC PANEL - Abnormal; Notable for the following components:      Result Value   Glucose, Bld 103 (*)    All other components within normal limits  URINALYSIS, ROUTINE W REFLEX MICROSCOPIC - Abnormal; Notable for the following components:   Leukocytes,Ua TRACE (*)    All other components within normal limits  LIPASE, BLOOD  CBC  PREGNANCY, URINE    EKG None  Radiology No results found.  Procedures Procedures   Medications Ordered in ED Medications - No data to display  ED Course  I have reviewed the triage vital signs and the nursing notes.  Pertinent labs & imaging results that were available during my care of the patient were reviewed by me and considered in my medical decision making (see chart for details).    MDM Rules/Calculators/A&P                          Get CT scan to rule out the pain in the right lower quadrant.  And evaluate for hernia.  Clinically no evidence of an incarcerated hernia but with limited can be challenging and difficult.  CT scan will  direct.  Patient's labs without any significant abnormalities.  CT scan without any acute findings.  Appendix is normal.  No evidence of hernia.  Patient's lipase is normal complete metabolic panel normal no liver function test abnormalities renal functions normal.  CBC no leukocytosis.  Urinalysis not consistent with urinary tract infection.  But there were 6-10 white blood cells and leukocyte esterase was just trace.  Not consistent with urinary tract infection.   Final Clinical Impression(s) / ED Diagnoses Final diagnoses:  Right lower quadrant abdominal pain    Rx / DC Orders ED Discharge Orders     None        Fredia Sorrow, MD 09/07/21 2225    Fredia Sorrow, MD 09/07/21 2315

## 2021-09-07 NOTE — Discharge Instructions (Addendum)
Work-up for the abdominal pain without evidence of hernia appendix is normal.   Most likely musculoskeletal type abdominal wall pain.  Can treat symptomatically.  Follow back up with your doctors.

## 2021-09-07 NOTE — ED Notes (Signed)
Pt from home with RLQ pain since Monday. Pt states she was supposed to see pcp today but their power went out and she did a televisit instead. The pcp concerned for hernia. Pt states pain is worse when she is moving around; has been 8/10 but is 3/10 right now. Pt alert & oriented, nad noted.

## 2021-09-18 DIAGNOSIS — Z03818 Encounter for observation for suspected exposure to other biological agents ruled out: Secondary | ICD-10-CM | POA: Diagnosis not present

## 2021-09-18 DIAGNOSIS — Z20822 Contact with and (suspected) exposure to covid-19: Secondary | ICD-10-CM | POA: Diagnosis not present

## 2021-09-19 DIAGNOSIS — J0110 Acute frontal sinusitis, unspecified: Secondary | ICD-10-CM | POA: Diagnosis not present

## 2021-10-01 ENCOUNTER — Other Ambulatory Visit: Payer: Self-pay | Admitting: *Deleted

## 2021-10-01 DIAGNOSIS — D509 Iron deficiency anemia, unspecified: Secondary | ICD-10-CM

## 2021-10-02 ENCOUNTER — Other Ambulatory Visit: Payer: Self-pay

## 2021-10-02 ENCOUNTER — Inpatient Hospital Stay: Payer: Medicare Other | Attending: Hematology and Oncology

## 2021-10-02 DIAGNOSIS — D509 Iron deficiency anemia, unspecified: Secondary | ICD-10-CM

## 2021-10-02 DIAGNOSIS — Z9884 Bariatric surgery status: Secondary | ICD-10-CM | POA: Insufficient documentation

## 2021-10-02 DIAGNOSIS — Z79899 Other long term (current) drug therapy: Secondary | ICD-10-CM | POA: Diagnosis not present

## 2021-10-02 LAB — CBC WITH DIFFERENTIAL (CANCER CENTER ONLY)
Abs Immature Granulocytes: 0.01 10*3/uL (ref 0.00–0.07)
Basophils Absolute: 0.1 10*3/uL (ref 0.0–0.1)
Basophils Relative: 1 %
Eosinophils Absolute: 0.4 10*3/uL (ref 0.0–0.5)
Eosinophils Relative: 6 %
HCT: 37.4 % (ref 36.0–46.0)
Hemoglobin: 12.8 g/dL (ref 12.0–15.0)
Immature Granulocytes: 0 %
Lymphocytes Relative: 41 %
Lymphs Abs: 2.4 10*3/uL (ref 0.7–4.0)
MCH: 29.8 pg (ref 26.0–34.0)
MCHC: 34.2 g/dL (ref 30.0–36.0)
MCV: 87.2 fL (ref 80.0–100.0)
Monocytes Absolute: 0.4 10*3/uL (ref 0.1–1.0)
Monocytes Relative: 7 %
Neutro Abs: 2.6 10*3/uL (ref 1.7–7.7)
Neutrophils Relative %: 45 %
Platelet Count: 163 10*3/uL (ref 150–400)
RBC: 4.29 MIL/uL (ref 3.87–5.11)
RDW: 12.9 % (ref 11.5–15.5)
WBC Count: 5.9 10*3/uL (ref 4.0–10.5)
nRBC: 0 % (ref 0.0–0.2)

## 2021-10-02 LAB — IRON AND IRON BINDING CAPACITY (CC-WL,HP ONLY)
Iron: 69 ug/dL (ref 28–170)
Saturation Ratios: 18 % (ref 10.4–31.8)
TIBC: 379 ug/dL (ref 250–450)
UIBC: 310 ug/dL (ref 148–442)

## 2021-10-02 LAB — FERRITIN: Ferritin: 52 ng/mL (ref 11–307)

## 2021-10-03 NOTE — Progress Notes (Signed)
°  HEMATOLOGY-ONCOLOGY TELEPHONE VISIT PROGRESS NOTE  I connected with Cari Burgo Stankowski on 10/04/2021 at  9:30 AM EST by telephone and verified that I am speaking with the correct person using two identifiers.  I discussed the limitations, risks, security and privacy concerns of performing an evaluation and management service by telephone and the availability of in person appointments.  I also discussed with the patient that there may be a patient responsible charge related to this service. The patient expressed understanding and agreed to proceed.   History of Present Illness: Kristina [name] is a 51 y.o. female with above-mentioned history of iron deficiency anemia treated with IV iron. She presents via telephone today for follow-up.   Observations/Objective:  She has been feeling quite well without any problems or concerns.   Assessment Plan:  Iron deficiency anemia Lab review 05/24/2020: Hemoglobin 13.6 (on 05/17/2027 was 11.5), MCV 87.3, ferritin 161, iron saturation 36% Status post bariatric surgery (laparoscopic sleeve gastrectomy with hiatal hernia repair 09/19/2015) 03/28/2021: Hemoglobin 13.4, ferritin 80, iron saturation 17% 10/02/21: Hb 12.8, Iron Sat 18%, Ferritin 52  No role of IV iron at this time.   IV iron: June 2021 (when the ferritin was at 9)  RTC in 1 year with labs    I discussed the assessment and treatment plan with the patient. The patient was provided an opportunity to ask questions and all were answered. The patient agreed with the plan and demonstrated an understanding of the instructions. The patient was advised to call back or seek an in-person evaluation if the symptoms worsen or if the condition fails to improve as anticipated.   Total time spent: 12 mins including non-face to face time and time spent for planning, charting and coordination of care  Rulon Eisenmenger, MD 10/04/2021    I, Thana Ates, am acting as scribe for Nicholas Lose, MD.  I have reviewed  the above documentation for accuracy and completeness, and I agree with the above.

## 2021-10-03 NOTE — Assessment & Plan Note (Signed)
Lab review 05/24/2020: Hemoglobin 13.6 (on 05/17/2027 was 11.5), MCV 87.3, ferritin 161, iron saturation 36% Status post bariatric surgery (laparoscopic sleeve gastrectomy with hiatal hernia repair 09/19/2015) 03/28/2021: Hemoglobin 13.4, ferritin 80, iron saturation 17% 10/02/21: Hb 12.8, Iron Sat 18%, Ferritin 52 No role of IV iron at this time.  IV iron: June 2021(when the ferritin was at 9)  RTC in 6 months with labs

## 2021-10-04 ENCOUNTER — Inpatient Hospital Stay (HOSPITAL_BASED_OUTPATIENT_CLINIC_OR_DEPARTMENT_OTHER): Payer: Medicare Other | Admitting: Hematology and Oncology

## 2021-10-04 DIAGNOSIS — D509 Iron deficiency anemia, unspecified: Secondary | ICD-10-CM

## 2021-10-09 ENCOUNTER — Encounter: Payer: Self-pay | Admitting: Internal Medicine

## 2021-10-09 ENCOUNTER — Telehealth: Payer: Self-pay | Admitting: Cardiology

## 2021-10-09 NOTE — Telephone Encounter (Signed)
°  Patient called to schedule her echo and follow up appt with Dr Domenic Polite. I see that there is a recall for both but there are no orders for an Echo. Can orders be placed so that we can get patient scheduled?

## 2021-10-10 ENCOUNTER — Other Ambulatory Visit: Payer: Self-pay | Admitting: *Deleted

## 2021-10-10 DIAGNOSIS — I77810 Thoracic aortic ectasia: Secondary | ICD-10-CM

## 2021-10-30 ENCOUNTER — Encounter: Payer: Medicare Other | Attending: General Surgery | Admitting: Skilled Nursing Facility1

## 2021-10-30 ENCOUNTER — Other Ambulatory Visit: Payer: Self-pay

## 2021-10-30 DIAGNOSIS — Z8639 Personal history of other endocrine, nutritional and metabolic disease: Secondary | ICD-10-CM | POA: Diagnosis not present

## 2021-10-30 DIAGNOSIS — Z6834 Body mass index (BMI) 34.0-34.9, adult: Secondary | ICD-10-CM | POA: Insufficient documentation

## 2021-10-30 DIAGNOSIS — Z713 Dietary counseling and surveillance: Secondary | ICD-10-CM | POA: Insufficient documentation

## 2021-10-31 NOTE — Progress Notes (Signed)
Follow-up visit:  Post-Operative 10/30/2021 Surgery  Medical Nutrition Therapy:  Appt start time: 6:00pm end time:  7:00pm  Primary concerns today: Post-operative Bariatric Surgery Nutrition Management 6 Month Post-Op Class  Surgery date: 06/26/2021 Surgery type: sleeve to RYGB Start weight at [facility]: 214.7 Weight today: 197.8 pounds   Body Composition Scale 07/10/2021 08/08/2021 10/31/2021  Current Body Weight 213.6 205.1 197.8  Total Body Fat % 42.2 41.2 40.3  Visceral Fat 14 13 12   Fat-Free Mass % 57.7 58.7 59.6   Total Body Water % 43.3 43.8 44.3  Muscle-Mass lbs 30.3 30.2 30  BMI 37.4 36 34.6  Body Fat Displacement            Torso  lbs 55.8 52.3 49.3         Left Leg  lbs 11.1 10.4 9.8         Right Leg  lbs 11.1 10.4 9.8         Left Arm  lbs 5.5 5.2 4.9         Right Arm   lbs 5.5 5.2 4.9   Clinical  Medical hx: GERD Medications: B 12, vitamin D Labs: B 12 530, A1C 5.5 Notable signs/symptoms:  Any previous deficiencies? anemia    Information Reviewed/ Discussed During Appointment: -Review of composition scale numbers -Fluid requirements (64-100 ounces) -Protein requirements (60-80g) -Strategies for tolerating diet -Advancement of diet to include Starchy vegetables -Barriers to inclusion of new foods -Inclusion of appropriate multivitamin and calcium supplements  -Exercise recommendations   Fluid intake: adequate   Medications: See List Supplementation: appropriate    Using straws: no Drinking while eating: no Having you been chewing well: yes Chewing/swallowing difficulties: no Changes in vision: no Changes to mood/headaches: no Hair loss/Cahnges to skin/Changes to nails: no Any difficulty focusing or concentrating: no Sweating: no Dizziness/Lightheaded: no Palpitations: no  Carbonated beverages: no N/V/D/C/GAS: no Abdominal Pain: no Dumping syndrome: no  Recent physical activity:  ADL's  Progress Towards Goal(s):  In Progress Teaching  method utilized: Visual & Auditory  Demonstrated degree of understanding via: Teach Back  Readiness Level: Action Barriers to learning/adherence to lifestyle change: none identified  Handouts given during visit include: Phase V diet Progression  Goals Sheet The Benefits of Exercise are endless..... Support Group Topics  Teaching Method Utilized:  Visual Auditory Hands on  Demonstrated degree of understanding via:  Teach Back   Monitoring/Evaluation:  Dietary intake, exercise, and body weight. Follow up in 3 months for 9 month post-op visit.

## 2021-11-16 ENCOUNTER — Ambulatory Visit (AMBULATORY_SURGERY_CENTER): Payer: Medicare Other | Admitting: *Deleted

## 2021-11-16 ENCOUNTER — Other Ambulatory Visit: Payer: Self-pay

## 2021-11-16 VITALS — Ht 63.0 in | Wt 194.0 lb

## 2021-11-16 DIAGNOSIS — Z800 Family history of malignant neoplasm of digestive organs: Secondary | ICD-10-CM

## 2021-11-16 DIAGNOSIS — Z86010 Personal history of colonic polyps: Secondary | ICD-10-CM

## 2021-11-16 NOTE — Progress Notes (Signed)
No egg or soy allergy known to patient  ?No issues known to pt with past sedation with any surgeries or procedures ?Patient denies ever being told they had issues or difficulty with intubation  ?No FH of Malignant Hyperthermia ?Pt is not on diet pills ?Pt is not on  home 02  ?Pt is not on blood thinners  ? ?No A fib or A flutter ? ?Pt is fully vaccinated  for Covid  ? ? ?Due to the COVID-19 pandemic we are asking patients to follow certain guidelines in PV and the [facility]   ?Pt aware of COVID protocols and [facility] guidelines  ? ?PV completed over the phone. Pt verified name, DOB, address and insurance during PV today.  ? ?Pt encouraged to call with questions or issues.  ?If pt has My chart, procedure instructions sent via My Chart   ?

## 2021-11-20 ENCOUNTER — Encounter: Payer: Self-pay | Admitting: Hematology and Oncology

## 2021-11-30 ENCOUNTER — Encounter: Payer: Self-pay | Admitting: Internal Medicine

## 2021-11-30 ENCOUNTER — Other Ambulatory Visit: Payer: Self-pay

## 2021-11-30 ENCOUNTER — Other Ambulatory Visit: Payer: Self-pay | Admitting: Internal Medicine

## 2021-11-30 ENCOUNTER — Ambulatory Visit (AMBULATORY_SURGERY_CENTER): Payer: Medicare Other | Admitting: Internal Medicine

## 2021-11-30 VITALS — BP 124/90 | HR 66 | Temp 97.8°F | Resp 16 | Ht 63.0 in | Wt 194.0 lb

## 2021-11-30 DIAGNOSIS — D124 Benign neoplasm of descending colon: Secondary | ICD-10-CM | POA: Diagnosis not present

## 2021-11-30 DIAGNOSIS — D120 Benign neoplasm of cecum: Secondary | ICD-10-CM | POA: Diagnosis not present

## 2021-11-30 DIAGNOSIS — D123 Benign neoplasm of transverse colon: Secondary | ICD-10-CM | POA: Diagnosis not present

## 2021-11-30 DIAGNOSIS — Z800 Family history of malignant neoplasm of digestive organs: Secondary | ICD-10-CM

## 2021-11-30 DIAGNOSIS — Z86010 Personal history of colonic polyps: Secondary | ICD-10-CM | POA: Diagnosis not present

## 2021-11-30 MED ORDER — DEXLANSOPRAZOLE 30 MG PO CPDR: 30.0000 mg | DELAYED_RELEASE_CAPSULE | Freq: Every day | ORAL | 0 refills | Status: DC

## 2021-11-30 MED ORDER — SODIUM CHLORIDE 0.9 % IV SOLN: 500.0000 mL | Freq: Once | INTRAVENOUS | Status: DC

## 2021-11-30 NOTE — Progress Notes (Signed)
Pt's states no medical or surgical changes since previsit or office visit.  Vitals DT 

## 2021-11-30 NOTE — Op Note (Signed)
[place] ?Patient Name: Kristina [name] ?Procedure Date: 11/30/2021 9:43 AM ?MRN: [id number] ?Endoscopist: [name] , MD ?Age: 51 ?Referring MD:  ?Date of Birth: 03-Jan-1971 ?Gender: Female ?Account #: [id number] ?Procedure:                Colonoscopy ?Indications:              Surveillance: Personal history of adenomatous  ?                          polyps on last colonoscopy > 3 years ago, Last  ?                          colonoscopy: 2019 ?Medicines:                Propofol per Anesthesia, Monitored Anesthesia Care ?Procedure:                Pre-Anesthesia Assessment: ?                          - Prior to the procedure, a History and Physical  ?                          was performed, and patient medications and  ?                          allergies were reviewed. The patient's tolerance of  ?                          previous anesthesia was also reviewed. The risks  ?                          and benefits of the procedure and the sedation  ?                          options and risks were discussed with the patient.  ?                          All questions were answered, and informed consent  ?                          was obtained. Prior Anticoagulants: The patient has  ?                          taken no previous anticoagulant or antiplatelet  ?                          agents. ASA Grade Assessment: III - A patient with  ?                          severe systemic disease. After reviewing the risks  ?                          and benefits, the patient was deemed in  ?  satisfactory condition to undergo the procedure. ?                          After obtaining informed consent, the colonoscope  ?                          was passed under direct vision. Throughout the  ?                          procedure, the patient's blood pressure, pulse, and  ?                          oxygen saturations were monitored continuously. The  ?                          CF [id number] #[id number]  was introduced through the anus  ?                          and advanced to the the cecum, identified by  ?                          appendiceal orifice and ileocecal valve. The  ?                          colonoscopy was performed without difficulty. The  ?                          patient tolerated the procedure well. The quality  ?                          of the bowel preparation was good. The ileocecal  ?                          valve, appendiceal orifice, and rectum were  ?                          photographed. The bowel preparation used was  ?                          Miralax via split dose instruction. ?Scope In: 9:55:33 AM ?Scope Out: 10:16:41 AM ?Scope Withdrawal Time: 0 hours 13 minutes 29 seconds  ?Total Procedure Duration: 0 hours 21 minutes 8 seconds  ?Findings:                 The perianal and digital rectal examinations were  ?                          normal. ?                          Three sessile polyps were found in the descending  ?                          colon, transverse colon and cecum. The polyps were  ?  diminutive in size. These polyps were removed with  ?                          a cold snare. Resection and retrieval were  ?                          complete. Verification of patient identification  ?                          for the specimen was done. Estimated blood loss was  ?                          minimal. ?                          Scattered diverticula were found in the sigmoid  ?                          colon. ?                          The exam was otherwise without abnormality on  ?                          direct and retroflexion views. ?Complications:            No immediate complications. ?Estimated Blood Loss:     Estimated blood loss was minimal. ?Impression:               - Three diminutive polyps in the descending colon,  ?                          in the transverse colon and in the cecum, removed  ?                          with a cold  snare. Resected and retrieved. ?                          - Diverticulosis in the sigmoid colon. ?                          - The examination was otherwise normal on direct  ?                          and retroflexion views. ?                          - Personal history of colonic polyps. 02/2015 - ssp  ?                          and adenoma max 10 mm - repeat colon 2019 3  ?                          diminutive polyps adenoma and SSP pathology ?                          -  FHx CRCA brother ?Recommendation:           - Patient has a contact number available for  ?                          emergencies. The signs and symptoms of potential  ?                          delayed complications were discussed with the  ?                          patient. Return to normal activities tomorrow.  ?                          Written discharge instructions were provided to the  ?                          patient. ?                          - Resume previous diet. ?                          - Continue present medications. ?                          - Await pathology results. ?                          - Repeat colonoscopy is recommended. The  ?                          colonoscopy date will be determined after pathology  ?                          results from today's exam become available for  ?                          review. ?                          - Rx Dexilant 30 mg - she is tapering 60 mg now -  ?                          s/p revision of sleeve gastrectomy to Roux-en-Y ?[name], MD ?11/30/2021 10:28:39 AM ?This report has been signed electronically. ?

## 2021-11-30 NOTE — Patient Instructions (Addendum)
Three polyps removed today. ?I will let you know pathology results and when to have another routine colonoscopy by mail and/or My Chart. ? ?I rxed 30 mg Dexilant - it says every day but you can taper as you are doing with the 60 mg. ? ?I appreciate the opportunity to care for you. ?[name], MD, [facility] ? ?Please read handouts provided. ?Continue present medications. ?Await pathology results. ? ? ?YOU HAD AN ENDOSCOPIC PROCEDURE TODAY AT [facility]:   Refer to the procedure report that was given to you for any specific questions about what was found during the examination.  If the procedure report does not answer your questions, please call your gastroenterologist to clarify.  If you requested that your care partner not be given the details of your procedure findings, then the procedure report has been included in a sealed envelope for you to review at your convenience later. ? ?YOU SHOULD EXPECT: Some feelings of bloating in the abdomen. Passage of more gas than usual.  Walking can help get rid of the air that was put into your GI tract during the procedure and reduce the bloating. If you had a lower endoscopy (such as a colonoscopy or flexible sigmoidoscopy) you may notice spotting of blood in your stool or on the toilet paper. If you underwent a bowel prep for your procedure, you may not have a normal bowel movement for a few days. ? ?Please Note:  You might notice some irritation and congestion in your nose or some drainage.  This is from the oxygen used during your procedure.  There is no need for concern and it should clear up in a day or so. ? ?SYMPTOMS TO REPORT IMMEDIATELY: ? ?Following lower endoscopy (colonoscopy or flexible sigmoidoscopy): ? Excessive amounts of blood in the stool ? Significant tenderness or worsening of abdominal pains ? Swelling of the abdomen that is new, acute ? Fever of 100?F or higher ? ? ?For urgent or emergent issues, a gastroenterologist can be reached at  any hour by calling [phone]. ?Do not use MyChart messaging for urgent concerns.  ? ? ?DIET:  We do recommend a small meal at first, but then you may proceed to your regular diet.  Drink plenty of fluids but you should avoid alcoholic beverages for 24 hours. ? ?ACTIVITY:  You should plan to take it easy for the rest of today and you should NOT DRIVE or use heavy machinery until tomorrow (because of the sedation medicines used during the test).   ? ?FOLLOW UP: ?Our staff will call the number listed on your records 48-72 hours following your procedure to check on you and address any questions or concerns that you may have regarding the information given to you following your procedure. If we do not reach you, we will leave a message.  We will attempt to reach you two times.  During this call, we will ask if you have developed any symptoms of COVID 19. If you develop any symptoms (ie: fever, flu-like symptoms, shortness of breath, cough etc.) before then, please call [phone].  If you test positive for Covid 19 in the 2 weeks post procedure, please call and report this information to [place].   ? ?If any biopsies were taken you will be contacted by phone or by letter within the next 1-3 weeks.  Please call us at [phone] if you have not heard about the biopsies in 3 weeks.  ? ? ?SIGNATURES/CONFIDENTIALITY: ?You and/or  your care partner have signed paperwork which will be entered into your electronic medical record.  These signatures attest to the fact that that the information above on your After Visit Summary has been reviewed and is understood.  Full responsibility of the confidentiality of this discharge information lies with you and/or your care-partner.  ?

## 2021-11-30 NOTE — Progress Notes (Signed)
[facility] History and Physical ? ? ?Primary Care Physician:  Pablo Lawrence, NP ? ? ?Reason for Procedure:   Hx polyps and FHx cRCA ? ?Plan:    colonoscopy ? ? ? ? ?HPI: Kristina [name] is a 51 y.o. female w/ hx colon polyps and FHx CRCA brother at 36 ?02/2015 - ssp and adenoma max 10 mm - repeat colon 2019 3 diminutive polyps adenoma and SSP pathology ? ?Past Medical History:  ?Diagnosis Date  ? Allergic rhinitis   ? Anemia   ? Anxiety   ? Aortic dilatation (HCC)   ? Asthma   ? Cervical cancer ([place])   ? Chronic back pain   ? Chronic cough   ? Depression   ? Diverticulosis   ? Essential hypertension   ? GERD (gastroesophageal reflux disease)   ? History of bronchitis   ? History of pneumonia   ? Hx of adenomatous and sessile serrated colonic polyps   ? Hyperlipidemia   ? Hypothyroidism   ? Lumbosacral spondylosis without myelopathy   ? Bulging disc - chronic pain  ? Obesity   ? Bariatric surgery  ? Obstructive sleep apnea   ? PONV (postoperative nausea and vomiting)   ? Restless leg   ? Tachycardia   ? Type 2 diabetes mellitus ([place])   ? Vitamin D deficiency   ? Vocal cord dysfunction   ? ? ?Past Surgical History:  ?Procedure Laterality Date  ? ABDOMINAL HYSTERECTOMY  1996  ? CHOLECYSTECTOMY  1994  ? COLONOSCOPY WITH PROPOFOL N/A 03/10/2015  ? Procedure: COLONOSCOPY WITH PROPOFOL;  Surgeon: [name], MD;  Location: [facility];  Service: Endoscopy;  Laterality: N/A;  ? ESOPHAGOGASTRODUODENOSCOPY N/A 10/27/2013  ? Procedure: ESOPHAGOGASTRODUODENOSCOPY (EGD);  Surgeon: [name], MD;  Location: [facility];  Service: Endoscopy;  Laterality: N/A;  ? GASTRIC ROUX-EN-Y N/A 06/26/2021  ? Procedure: LAPAROSCOPIC ROUX-EN-Y GASTRIC BYPASS WITH UPPER ENDOSCOPY;  Surgeon: [name], MD;  Location: [facility];  Service: General;  Laterality: N/A;  ? HIATAL HERNIA REPAIR N/A 06/26/2021  ? Procedure: HERNIA REPAIR HIATAL;  Surgeon: [name], MD;  Location: [facility];  Service: General;  Laterality: N/A;   ? KNEE ARTHROSCOPY Right   ? LAPAROSCOPIC GASTRIC SLEEVE RESECTION WITH HIATAL HERNIA REPAIR N/A 09/19/2015  ? Procedure: LAPAROSCOPIC GASTRIC SLEEVE RESECTION WITH HIATAL HERNIA REPAIR;  Surgeon: [name], MD;  Location: [facility];  Service: General;  Laterality: N/A;  ? LEFT HEART CATH AND CORONARY ANGIOGRAPHY N/A 05/17/2019  ? Procedure: LEFT HEART CATH AND CORONARY ANGIOGRAPHY;  Surgeon: Jettie Booze, MD;  Location: [facility];  Service: Cardiovascular;  Laterality: N/A;  ? LIPOSUCTION N/A 11/26/2018  ? Procedure: LIPOSUCTION;  Surgeon: Wallace Going, DO;  Location: [place];  Service: Plastics;  Laterality: N/A;  ? LIPOSUCTION N/A 11/02/2020  ? Procedure: with liposuction;  Surgeon: Wallace Going, DO;  Location: [place];  Service: Plastics;  Laterality: N/A;  ? MASS EXCISION N/A 11/02/2020  ? Procedure: Excision of suprapubic area;  Surgeon: Wallace Going, DO;  Location: [place];  Service: Plastics;  Laterality: N/A;  90 min  ? NOSE SURGERY  2004  ? ovaries removed    ? PANNICULECTOMY N/A 11/26/2018  ? Procedure: PANNICULECTOMY;  Surgeon: Wallace Going, DO;  Location: [place];  Service: Plastics;  Laterality: N/A;  ? TUBAL LIGATION    ? UPPER GI ENDOSCOPY  09/19/2015  ? Procedure: UPPER GI  ENDOSCOPY;  Surgeon: [name], MD;  Location: [facility];  Service: General;;  ? ? ?Prior to Admission medications   ?Medication Sig Start Date End Date Taking? Authorizing Provider  ?buPROPion (WELLBUTRIN SR) 200 MG 12 hr tablet Take 150 mg by mouth 2 (two) times daily. 05/21/19  Yes [provider]  ?Cholecalciferol 1.25 MG (50000 UT) capsule cholecalciferol (vitamin D3) 1,250 mcg (50,000 unit) capsule ? TAKE 1 CAPSULE BY MOUTH ONCE A WEEK   Yes [provider]  ?dexlansoprazole (DEXILANT) 60 MG capsule Take 1 capsule (60 mg total) by mouth daily before breakfast. ?Patient taking differently: Take 60 mg  by mouth at bedtime. 04/06/18  Yes [name], MD  ?levocetirizine (XYZAL) 5 MG tablet Take 5 mg by mouth at bedtime.   Yes [provider]  ?levothyroxine (SYNTHROID) 112 MCG tablet Take 112 mcg by mouth daily before breakfast. 11/24/19  Yes [provider]  ?Multiple Vitamin (MULTIVITAMIN) tablet Take 1 tablet by mouth daily.   Yes [provider]  ?albuterol (VENTOLIN HFA) 108 (90 Base) MCG/ACT inhaler Inhale 1-2 puffs into the lungs every 6 (six) hours as needed for wheezing or shortness of breath.    [provider]  ?cyanocobalamin (,VITAMIN B-12,) 1000 MCG/ML injection Inject 1,000 mcg into the muscle every 30 (thirty) days. 11/12/19   [provider]  ?estradiol (ESTRACE) 0.1 MG/GM vaginal cream estradiol 0.01% (0.1 mg/gram) vaginal cream    [provider]  ?ondansetron (ZOFRAN-ODT) 4 MG disintegrating tablet Dissolve 1 tablet (4 mg total) by mouth every 6 (six) hours as needed for nausea or vomiting. ?Patient not taking: Reported on 11/16/2021 06/27/21   [name], MD  ?promethazine (PHENERGAN) 12.5 MG tablet Take 12.5 mg by mouth every 6 (six) hours as needed. 07/20/21   [provider]  ? ? ?Current Outpatient Medications  ?Medication Sig Dispense Refill  ? buPROPion (WELLBUTRIN SR) 200 MG 12 hr tablet Take 150 mg by mouth 2 (two) times daily.    ? Cholecalciferol 1.25 MG (50000 UT) capsule cholecalciferol (vitamin D3) 1,250 mcg (50,000 unit) capsule ? TAKE 1 CAPSULE BY MOUTH ONCE A WEEK    ? dexlansoprazole (DEXILANT) 60 MG capsule Take 1 capsule (60 mg total) by mouth daily before breakfast. (Patient taking differently: Take 60 mg by mouth at bedtime.) 90 capsule 3  ? levocetirizine (XYZAL) 5 MG tablet Take 5 mg by mouth at bedtime.    ? levothyroxine (SYNTHROID) 112 MCG tablet Take 112 mcg by mouth daily before breakfast.    ? Multiple Vitamin (MULTIVITAMIN) tablet Take 1 tablet by mouth daily.    ? albuterol (VENTOLIN HFA) 108 (90 Base)  MCG/ACT inhaler Inhale 1-2 puffs into the lungs every 6 (six) hours as needed for wheezing or shortness of breath.    ? cyanocobalamin (,VITAMIN B-12,) 1000 MCG/ML injection Inject 1,000 mcg into the muscle every 30 (thirty) days.    ? estradiol (ESTRACE) 0.1 MG/GM vaginal cream estradiol 0.01% (0.1 mg/gram) vaginal cream    ? ondansetron (ZOFRAN-ODT) 4 MG disintegrating tablet Dissolve 1 tablet (4 mg total) by mouth every 6 (six) hours as needed for nausea or vomiting. (Patient not taking: Reported on 11/16/2021) 20 tablet 0  ? promethazine (PHENERGAN) 12.5 MG tablet Take 12.5 mg by mouth every 6 (six) hours as needed.    ? ?Current Facility-Administered Medications  ?Medication Dose Route Frequency Provider Last Rate Last Admin  ? 0.9 %  sodium chloride infusion  500 mL Intravenous Once Silvano Rusk  E, MD      ? ?Facility-Administered Medications Ordered in Other Visits  ?Medication Dose Route Frequency Provider Last Rate Last Admin  ? famotidine (PEPCID) IVPB 20 mg premix  20 mg Intravenous Once [name], MD      ? sodium chloride 0.9 % 1,000 mL with thiamine 315 mg, folic acid 1 mg, M.V.I. Adult 10 mL infusion   Intravenous Once [name], MD      ? sodium chloride 0.9 % bolus 1,000 mL  1,000 mL Intravenous Once [name], MD      ? ? ?Allergies as of 11/30/2021 - Review Complete 11/30/2021  ?Allergen Reaction Noted  ? Ampicillin Anaphylaxis 02/02/2008  ? Codeine Hives and Shortness Of Breath 02/02/2008  ? Sulfa antibiotics Itching and Rash 02/02/2008  ? Sulfonamide derivatives Itching and Rash 02/02/2008  ? ? ?Family History  ?Problem Relation Age of Onset  ? Emphysema Mother   ? Allergies Mother   ? Heart disease Mother   ? Asthma Mother   ? Ovarian cancer Mother   ? Hypertension Mother   ? Diabetes Mother   ? High Cholesterol Mother   ? Stroke Mother   ? Thyroid disease Mother   ? Depression Mother   ? Anxiety disorder Mother   ? Heart disease Father   ?     deceased at age 75 from heart attack  ?  Sudden death Father   ? High Cholesterol Father   ? High blood pressure Father   ? Hyperthyroidism Sister   ? COPD Sister   ?     died at 55  ? Sudden death Sister   ? Thyroid disease Brother   ?

## 2021-11-30 NOTE — Progress Notes (Signed)
Called to room to assist during endoscopic procedure.  Patient ID and intended procedure confirmed with present staff. Received instructions for my participation in the procedure from the performing physician.  

## 2021-11-30 NOTE — Progress Notes (Signed)
To pacu, VSS. Report to [email or web address] 

## 2021-12-04 ENCOUNTER — Telehealth: Payer: Self-pay

## 2021-12-04 NOTE — Telephone Encounter (Signed)
?  Follow up Call- ? ?Call back number 11/30/2021 02/21/2020  ?Post procedure Call Back phone  # [phone]  ?Permission to leave phone message Yes Yes  ?Some recent data might be hidden  ?  ? ?Patient questions: ? ?Do you have a fever, pain , or abdominal swelling? No. ?Pain Score  0 * ? ?Have you tolerated food without any problems? Yes.   ? ?Have you been able to return to your normal activities? Yes.   ? ?Do you have any questions about your discharge instructions: ?Diet   No. ?Medications  No. ?Follow up visit  No. ? ?Do you have questions or concerns about your Care? No. ? ?Actions: ?* If pain score is 4 or above: ?No action needed, pain <4. ? ?Have you developed a fever since your procedure? no ? ?2.   Have you had an respiratory symptoms (SOB or cough) since your procedure? no ? ?3.   Have you tested positive for COVID 19 since your procedure no ? ?4.   Have you had any family members/close contacts diagnosed with the COVID 19 since your procedure?  no ? ? ?If yes to any of these questions please route to Joylene John, RN and Joella Prince, RN  ? ? ?

## 2021-12-09 ENCOUNTER — Encounter: Payer: Self-pay | Admitting: Internal Medicine

## 2021-12-11 DIAGNOSIS — E039 Hypothyroidism, unspecified: Secondary | ICD-10-CM | POA: Diagnosis not present

## 2021-12-11 DIAGNOSIS — E782 Mixed hyperlipidemia: Secondary | ICD-10-CM | POA: Diagnosis not present

## 2021-12-11 DIAGNOSIS — E1169 Type 2 diabetes mellitus with other specified complication: Secondary | ICD-10-CM | POA: Diagnosis not present

## 2021-12-11 DIAGNOSIS — E785 Hyperlipidemia, unspecified: Secondary | ICD-10-CM | POA: Diagnosis not present

## 2021-12-11 DIAGNOSIS — Z23 Encounter for immunization: Secondary | ICD-10-CM | POA: Diagnosis not present

## 2021-12-11 DIAGNOSIS — E538 Deficiency of other specified B group vitamins: Secondary | ICD-10-CM | POA: Diagnosis not present

## 2021-12-20 ENCOUNTER — Ambulatory Visit (INDEPENDENT_AMBULATORY_CARE_PROVIDER_SITE_OTHER): Payer: Medicare Other

## 2021-12-20 DIAGNOSIS — I361 Nonrheumatic tricuspid (valve) insufficiency: Secondary | ICD-10-CM | POA: Diagnosis not present

## 2021-12-20 DIAGNOSIS — I77810 Thoracic aortic ectasia: Secondary | ICD-10-CM

## 2021-12-20 LAB — ECHOCARDIOGRAM COMPLETE
AR max vel: 3.67 cm2
AV Area VTI: 3.73 cm2
AV Area mean vel: 3.78 cm2
AV Mean grad: 3 mmHg
AV Peak grad: 5.7 mmHg
AV Vena cont: 0.55 cm
Ao pk vel: 1.19 m/s
Area-P 1/2: 3.7 cm2
Calc EF: 59.3 %
S' Lateral: 2.83 cm
Single Plane A2C EF: 55.2 %
Single Plane A4C EF: 62.5 %

## 2022-01-01 ENCOUNTER — Encounter: Payer: Self-pay | Admitting: Plastic Surgery

## 2022-01-01 ENCOUNTER — Ambulatory Visit (INDEPENDENT_AMBULATORY_CARE_PROVIDER_SITE_OTHER): Payer: Medicare Other | Admitting: Plastic Surgery

## 2022-01-01 VITALS — BP 125/84 | HR 87 | Ht 63.0 in | Wt 195.0 lb

## 2022-01-01 DIAGNOSIS — M546 Pain in thoracic spine: Secondary | ICD-10-CM | POA: Diagnosis not present

## 2022-01-01 DIAGNOSIS — G8929 Other chronic pain: Secondary | ICD-10-CM

## 2022-01-01 DIAGNOSIS — E881 Lipodystrophy, not elsewhere classified: Secondary | ICD-10-CM

## 2022-01-01 DIAGNOSIS — M5136 Other intervertebral disc degeneration, lumbar region: Secondary | ICD-10-CM | POA: Diagnosis not present

## 2022-01-01 DIAGNOSIS — M542 Cervicalgia: Secondary | ICD-10-CM

## 2022-01-01 DIAGNOSIS — E1165 Type 2 diabetes mellitus with hyperglycemia: Secondary | ICD-10-CM | POA: Diagnosis not present

## 2022-01-01 DIAGNOSIS — Z9884 Bariatric surgery status: Secondary | ICD-10-CM

## 2022-01-01 NOTE — Progress Notes (Signed)
? ?  Patient ID: Kristina [name], female    DOB: [birth year], 51 y.o.   MRN: [id number] ? ? ?Chief Complaint  ?Patient presents with  ? Consult  ? ? ?The patient is a 51 year old female here for evaluation of her upper arms and breast area.  She has done an amazing job in decreasing her weight.  She had a sleeve done that was then converted to gastric bypass 6 months ago.  She has been able to reduce her weight by 100 pounds.  She has been able to keep it down.  She is hoping to lose another 30 to 40 pounds.  In 2017 she was 330 pounds she is now 195 pounds and has been stable for the past 5 months.  She has a severe amount of excess tissue in the axilla that has a lot of chafing and skin irritation.  She gets rashes and it becomes very uncomfortable particularly in the summer.  She is unable to put her arms completely to her side due to the excess soft tissue and skin.  She is also interested in excision of her upper arm excess skin which would be a brachioplasty. ? ? ?Review of Systems  ?Constitutional:  Positive for activity change. Negative for appetite change.  ?Eyes: Negative.   ?Respiratory: Negative.  Negative for chest tightness and shortness of breath.   ?Cardiovascular: Negative.   ?Gastrointestinal: Negative.   ?Endocrine: Negative.   ?Genitourinary: Negative.   ?Musculoskeletal:  Positive for back pain and neck pain.  ?Skin:  Positive for rash.  ?Hematological: Negative.  Negative for adenopathy.  ?Psychiatric/Behavioral: Negative.    ? ?Past Medical History:  ?Diagnosis Date  ? Allergic rhinitis   ? Anemia   ? Anxiety   ? Aortic dilatation (HCC)   ? Asthma   ? Cervical cancer ([place])   ? Chronic back pain   ? Chronic cough   ? Depression   ? Diverticulosis   ? Essential hypertension   ? GERD (gastroesophageal reflux disease)   ? History of bronchitis   ? History of pneumonia   ? Hx of adenomatous and sessile serrated colonic polyps   ? Hyperlipidemia   ? Hypothyroidism   ? Lumbosacral spondylosis without  myelopathy   ? Bulging disc - chronic pain  ? Obesity   ? Bariatric surgery  ? Obstructive sleep apnea   ? PONV (postoperative nausea and vomiting)   ? Restless leg   ? Tachycardia   ? Type 2 diabetes mellitus ([place])   ? Vitamin D deficiency   ? Vocal cord dysfunction   ?  ?Past Surgical History:  ?Procedure Laterality Date  ? ABDOMINAL HYSTERECTOMY  1996  ? CHOLECYSTECTOMY  1994  ? COLONOSCOPY WITH PROPOFOL N/A 03/10/2015  ? Procedure: COLONOSCOPY WITH PROPOFOL;  Surgeon: [name], MD;  [facility];  Service: Endoscopy;  Laterality: N/A;  ? ESOPHAGOGASTRODUODENOSCOPY N/A 10/27/2013  ? Procedure: ESOPHAGOGASTRODUODENOSCOPY (EGD);  Surgeon: [name], MD;  Location: [facility];  Service: Endoscopy;  Laterality: N/A;  ? GASTRIC ROUX-EN-Y N/A 06/26/2021  ? Procedure: LAPAROSCOPIC ROUX-EN-Y GASTRIC BYPASS WITH UPPER ENDOSCOPY;  Surgeon: [name], MD;  [facility];  Service: General;  Laterality: N/A;  ? HIATAL HERNIA REPAIR N/A 06/26/2021  ? Procedure: HERNIA REPAIR HIATAL;  Surgeon: [name], MD;  [facility];  Service: General;  Laterality: N/A;  ? KNEE ARTHROSCOPY Right   ? LAPAROSCOPIC GASTRIC SLEEVE RESECTION WITH HIATAL HERNIA REPAIR N/A 09/19/2015  ? Procedure:  LAPAROSCOPIC GASTRIC SLEEVE RESECTION WITH HIATAL HERNIA REPAIR;  Surgeon: [name], MD;  [facility];  Service: General;  Laterality: N/A;  ? LEFT HEART CATH AND CORONARY ANGIOGRAPHY N/A 05/17/2019  ? Procedure: LEFT HEART CATH AND CORONARY ANGIOGRAPHY;  Surgeon: Jettie Booze, MD;  Location: [place] [facility];  Service: Cardiovascular;  Laterality: N/A;  ? LIPOSUCTION N/A 11/26/2018  ? Procedure: LIPOSUCTION;  Surgeon: Wallace Going, DO;  Location: [place];  Service: Plastics;  Laterality: N/A;  ? LIPOSUCTION N/A 11/02/2020  ? Procedure: with liposuction;  Surgeon: Wallace Going, DO;  Location: [place];  Service: Plastics;  Laterality: N/A;  ? MASS  EXCISION N/A 11/02/2020  ? Procedure: Excision of suprapubic area;  Surgeon: Wallace Going, DO;  Location: [place];  Service: Plastics;  Laterality: N/A;  90 min  ? NOSE SURGERY  2004  ? ovaries removed    ? PANNICULECTOMY N/A 11/26/2018  ? Procedure: PANNICULECTOMY;  Surgeon: Wallace Going, DO;  Location: [place];  Service: Plastics;  Laterality: N/A;  ? TUBAL LIGATION    ? UPPER GI ENDOSCOPY  09/19/2015  ? Procedure: UPPER GI ENDOSCOPY;  Surgeon: [name], MD;  [facility];  Service: General;;  ?  ? ? ?Current Outpatient Medications:  ?  buPROPion (WELLBUTRIN SR) 150 MG 12 hr tablet, Take 150 mg by mouth 2 (two) times daily., Disp: , Rfl:  ?  Cholecalciferol 1.25 MG (50000 UT) capsule, cholecalciferol (vitamin D3) 1,250 mcg (50,000 unit) capsule  TAKE 1 CAPSULE BY MOUTH ONCE A WEEK, Disp: , Rfl:  ?  cyanocobalamin (,VITAMIN B-12,) 1000 MCG/ML injection, Inject 1,000 mcg into the muscle every 30 (thirty) days., Disp: , Rfl:  ?  estradiol (ESTRACE) 0.1 MG/GM vaginal cream, estradiol 0.01% (0.1 mg/gram) vaginal cream, Disp: , Rfl:  ?  levocetirizine (XYZAL) 5 MG tablet, Take 5 mg by mouth at bedtime., Disp: , Rfl:  ?  levothyroxine (SYNTHROID) 112 MCG tablet, Take 112 mcg by mouth daily before breakfast., Disp: , Rfl:  ?  Multiple Vitamin (MULTIVITAMIN) tablet, Take 1 tablet by mouth daily., Disp: , Rfl:  ?No current facility-administered medications for this visit. ? ?Facility-Administered Medications Ordered in Other Visits:  ?  famotidine (PEPCID) IVPB 20 mg premix, 20 mg, Intravenous, Once, [name], MD ?  sodium chloride 0.9 % 1,000 mL with thiamine 573 mg, folic acid 1 mg, M.V.I. Adult 10 mL infusion, , Intravenous, Once, [name], MD ?  sodium chloride 0.9 % bolus 1,000 mL, 1,000 mL, Intravenous, Once, [name], MD  ? ?Objective:  ? ?Vitals:  ? 01/01/22 1308  ?BP: 125/84  ?Pulse: 87  ?SpO2: 96%  ? ? ?Physical Exam ?Constitutional:   ?    Appearance: Normal appearance.  ?HENT:  ?   Head: Normocephalic and atraumatic.  ?Cardiovascular:  ?   Rate and Rhythm: Normal rate.  ?   Pulses: Normal pulses.  ?Pulmonary:  ?   Effort: Pulmonary effort is normal.  ?Abdominal:  ?   Palpations: Abdomen is soft.  ?Musculoskeletal:     ?   General: No swelling or deformity.  ?Skin: ?   General: Skin is warm.  ?   Capillary Refill: Capillary refill takes less than 2 seconds.  ?   Coloration: Skin is not jaundiced.  ?   Findings: No bruising or lesion.  ?Neurological:  ?   Mental Status: She is alert and oriented to person, place,  and time.  ?Psychiatric:     ?   Mood and Affect: Mood normal.     ?   Behavior: Behavior normal.     ?   Thought Content: Thought content normal.     ?   Judgment: Judgment normal.  ? ? ?Assessment & Plan:  ?S/P gastric bypass ? ?Lipodystrophy ? ?Degeneration of lumbar intervertebral disc ? ?Type 2 diabetes mellitus with hyperglycemia, without long-term current use of insulin ([place]) ? ?Chronic bilateral thoracic back pain ? ?Neck pain ? ?Plan for brachioplasty and excision of excess breast and axillary tissue.  We discussed the scars and the recovery time. ?Pictures were obtained of the patient and placed in the chart with the patient's or guardian's permission. ? ? ?Loel Lofty Jaiel Saraceno, DO ?

## 2022-01-14 NOTE — Telephone Encounter (Signed)
Patient left voicemail message to follow up on MyChart message; requesting call back. Patient wants to know if insurance will cover any other procedure. Please advise at [phone]. ?

## 2022-01-31 ENCOUNTER — Ambulatory Visit: Payer: Medicare Other | Admitting: Cardiology

## 2022-01-31 ENCOUNTER — Ambulatory Visit: Payer: Medicare Other | Admitting: Skilled Nursing Facility1

## 2022-02-17 IMAGING — RF DG UGI W SINGLE CM
11 series · 14 of 24 positions shown · non-contrast
Comparison: NONE.

CLINICAL DATA: Odynophasia. Intermittent vomiting. Gastroesophageal
reflux disease. Approximately 1 month status post Roux-en-Y gastric
bypass surgery.

EXAM:
DG UGI W SINGLE CM
TECHNIQUE: Scout radiograph was obtained. Single contrast examination was
performed using thin liquid barium. This exam was performed by Jim
Prishen PA, and was supervised and interpreted by Marcus. Geir Andre Ti.
FLUOROSCOPY TIME:  Radiation Exposure Index (as provided by the
fluoroscopic device): 103.2 mGy
If the device does not provide the exposure index:
Fluoroscopy Time:  4 minutes 48 seconds
Number of Acquired Images:  1

[Series 1: one shot · 0.14mm/px · 1 of 1 slices shown (1 of 4)]
[im 1/1]
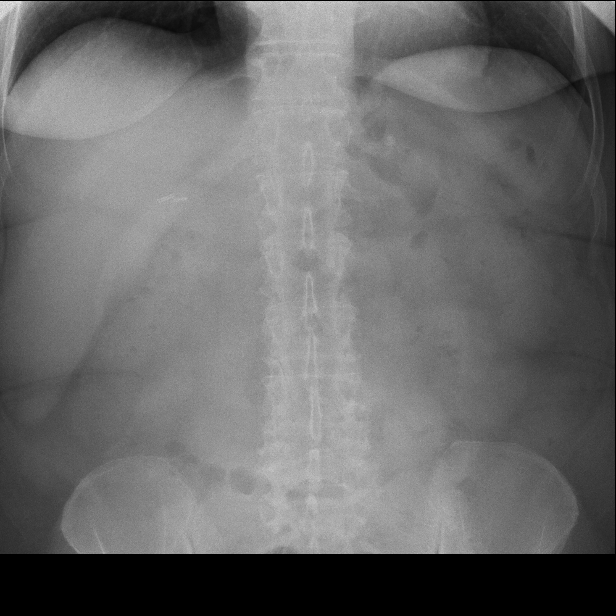

[Series 2: sequence · 1 of 92 frames shown (1 of 7)]
[frame 61/92]
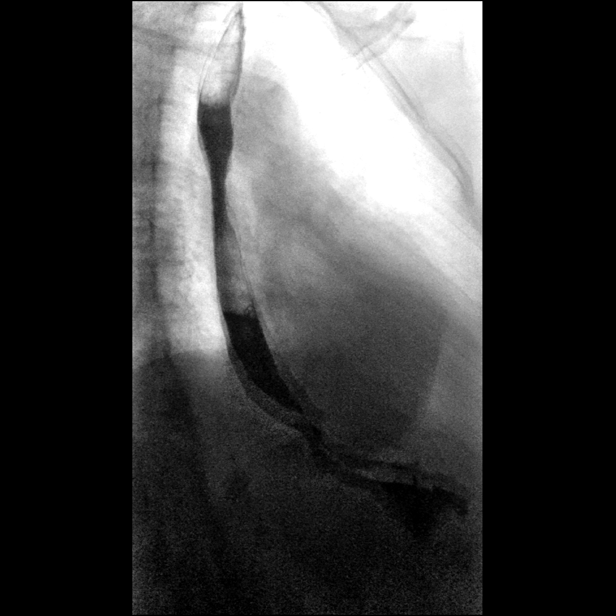

[Series 3: sequence · 1 of 20 frames shown (2 of 7)]
[frame 9/20]
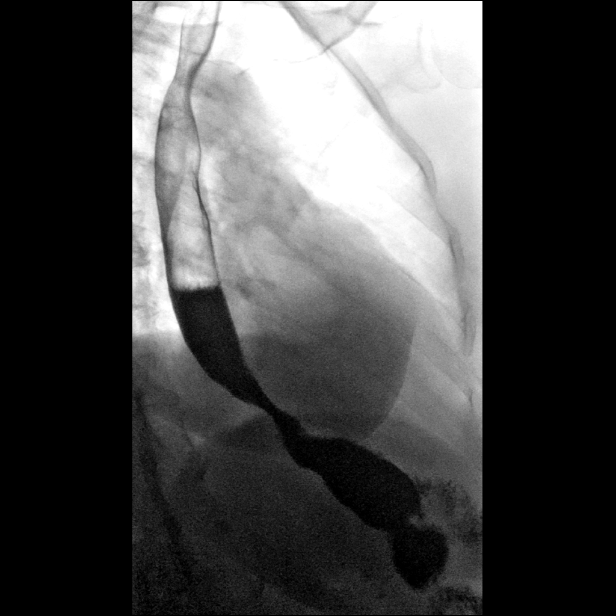

[Series 5: sequence · 2 of 32 frames shown (3 of 7)]
[frame 5/32]
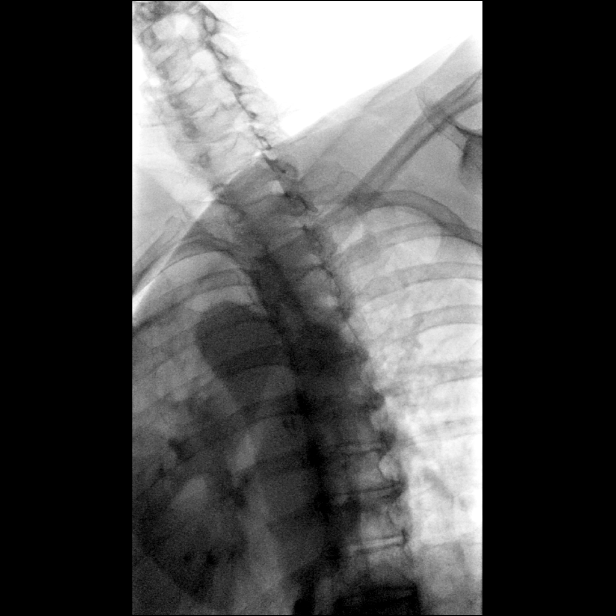
[frame 17/32]
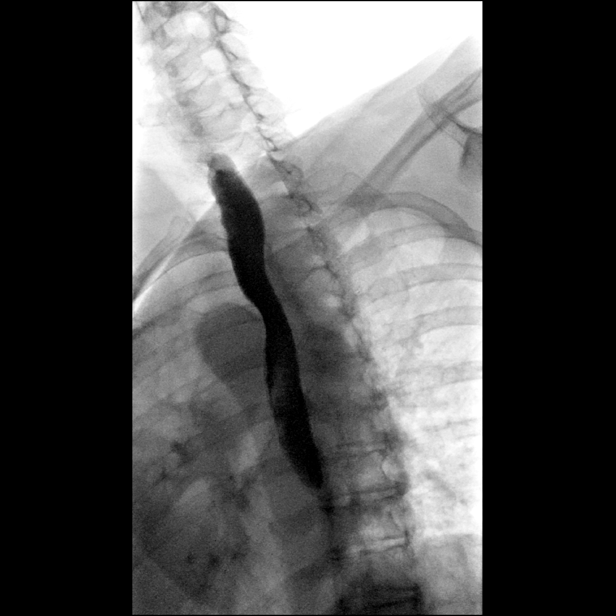

[Series 6: sequence · 1 of 15 frames shown (4 of 7)]
[frame 1/15]
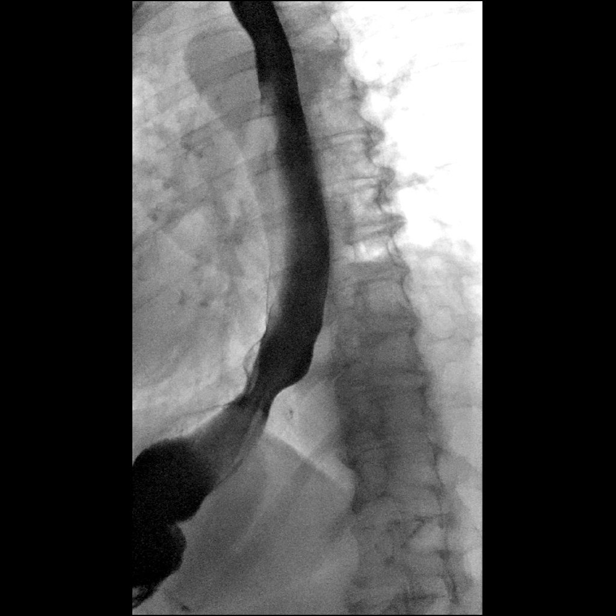

[Series 7: one shot · 0.15mm/px · 2 of 2 slices shown (2 of 4)]
[im 1/2]
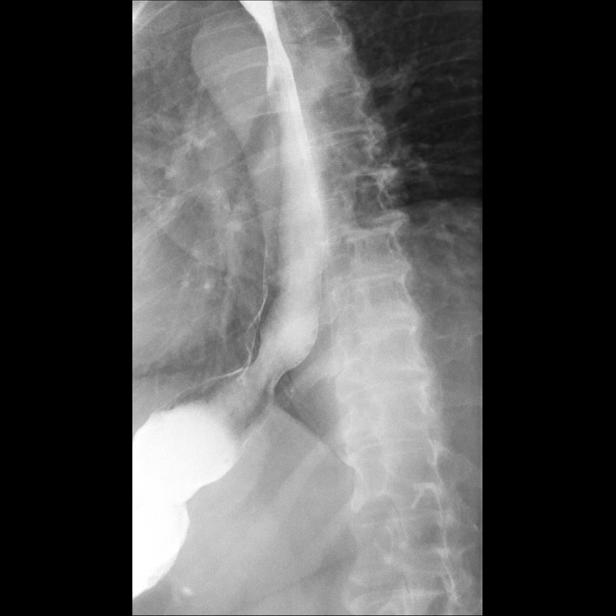
[im 2/2]
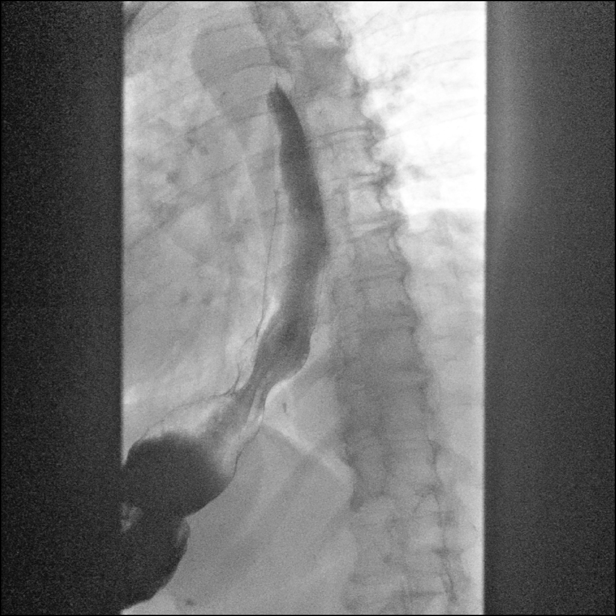

[Series 8: sequence · 1 of 15 frames shown (5 of 7)]
[frame 14/15]
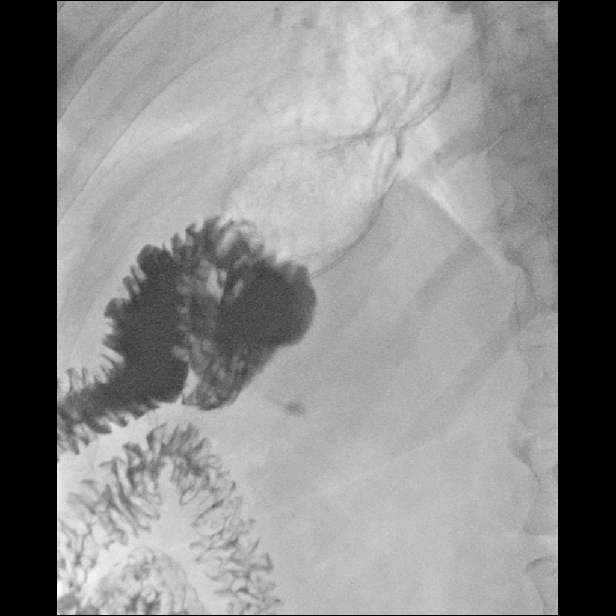

[Series 9: one shot · 1 of 3 slices shown (3 of 4)]
[im 3/3]
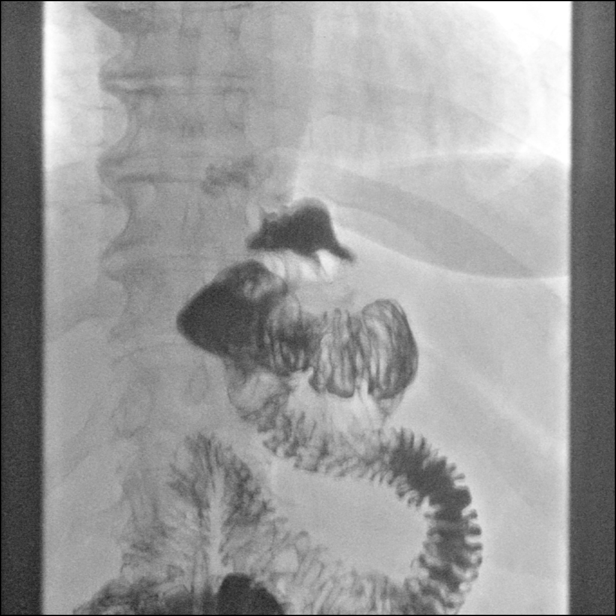

[Series 10: sequence · 1 of 13 frames shown (6 of 7)]
[frame 7/13]
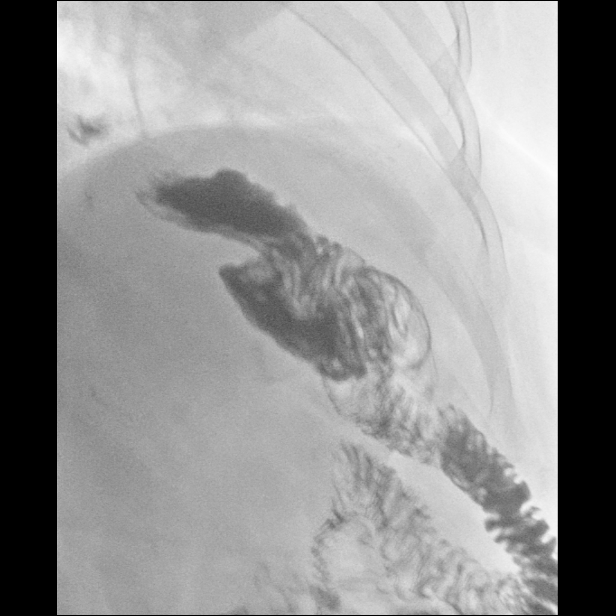

[Series 11: sequence · 2 of 52 frames shown (7 of 7)]
[frame 8/52]
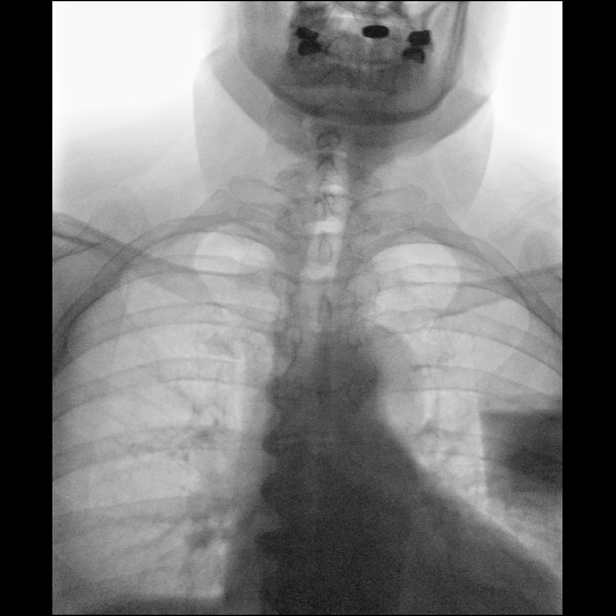
[frame 45/52]
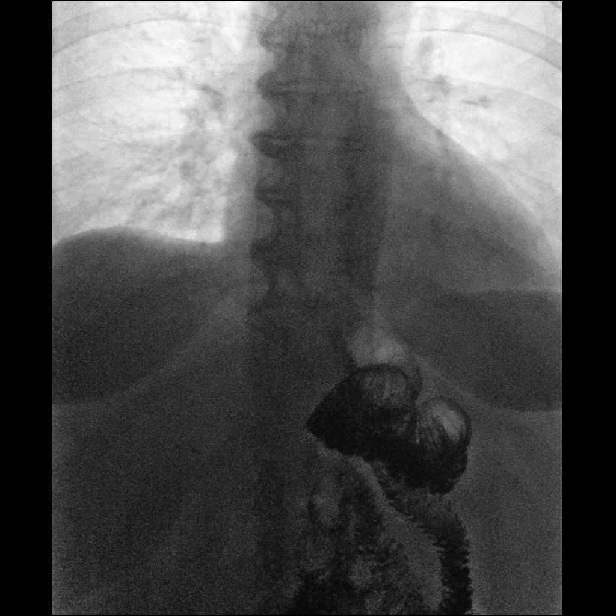

[Series 12: one shot · 1 of 3 slices shown (4 of 4)]
[im 3/3]
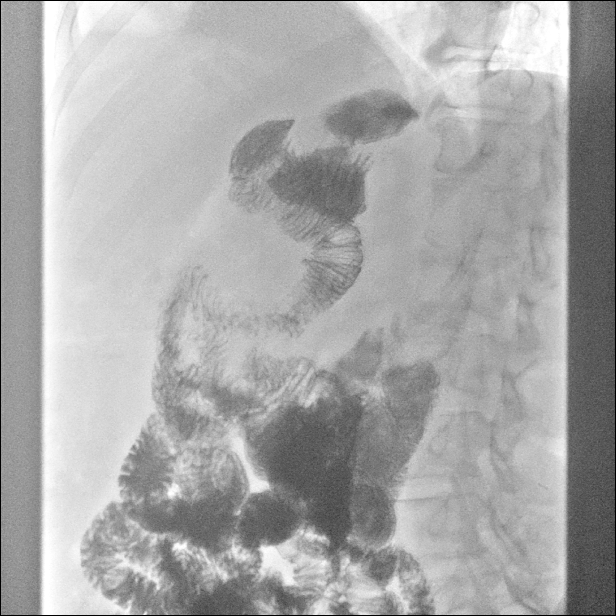

[14 of 24 positions shown; findings below may reference images not displayed]

FINDINGS: Scout Radiograph: Normal bowel gas pattern. Right upper quadrant
surgical clips from prior cholecystectomy.

Esophagus:  Normal appearance.

Esophageal motility:  Within normal limits.

Gastroesophageal reflux:  None visualized.

Ingested 13mm barium tablet:  Passed normally

Stomach: Expected postop changes are seen from Roux-en-Y gastric
bypass surgery. The gastric pouch is normal in appearance, size, and
location. The anastomosis with the Roux limb is normal in
appearance. Prompt emptying of the gastric pouch is seen, and there
is no evidence of contrast leak/extravasation.

Hiatal Hernia:   None.

Gastric pouch emptying: Normal.

Duodenum: The Roux limb of small bowel and proximal small bowel
loops are normal in appearance. No evidence of ulcer, stricture, or
fold thickening.

Other:  None.
IMPRESSION: Normal exam status post Roux-en-Y gastric bypass surgery. No
evidence of stricture, ulcer, or other significant abnormality.

## 2022-03-11 NOTE — Progress Notes (Signed)
Patient ID: Kristina [name], female    DOB: November 04, 1970, 51 y.o.   MRN: [id number]  Chief Complaint  Patient presents with   Pre-op Exam      ICD-10-CM   1. Lipodystrophy  E88.1        History of Present Illness: Kristina [name] is a 51 y.o.  female  with a history of gastric bypass, lipodystrophy and T2DM.  She presents for preoperative evaluation for upcoming procedure, excision of bilateral axillary tissue, scheduled for 03/28/20 with Dr. Marla Roe.  The patient has had problems with anesthesia. Patient reports she has felt nauseous after anesthesia in the past but states scopolamine patch has helped in the past.  Patient denies any personal or family history of breast cancer.  Patient reports she does have an enlarged aorta.  She states that she had an echo 1 month ago that appeared the same.  She states that she follows up with Dr. Domenic Polite for this.  Patient denies any use of birth control or hormone replacement.  She reports she had 1 miscarriage.  She denies any personal or family history of clots.  She denies any recent major surgeries, traumas, infections, pregnancies, strokes or heart attacks.  She denies any history of inflammatory bowel disease.  She does report she has asthma but she has not used an inhaler for 2 years.  She denies COPD.  She denies any cancer.    Summary of Previous Visit: Patient was seen by Dr. Marla Roe 01/01/2022.  At this visit, patient reported she lost approximately 100 pounds.  She was found to have a severe amount of excess tissue in the axilla that has a lot of skin chafing and irritation.  Patient reported she gets rashes and the excess skin becomes particularly uncomfortable in the summer time.  She reported she was unable to put her arms down due to the excess skin and tissue.  Job: Patient works in [employer] 3 times a week.  We will plan to have off her 2 weeks.  Discussed with patient that she may submit any FMLA to [place] if needed.  PMH  Significant for: Asthma, sleep apnea, GERD, hyperlipidemia, hypothyroidism, gastric bypass.  Patient states she used to be prediabetic but reports it is now controlled with diet and exercise.  She states her last A1c was less than 6 months ago and she states her A1c usually 5.  Most recent A1c in the chart is from 12/11/21 found to be 5.4.   Past Medical History: Allergies: Allergies  Allergen Reactions   Ampicillin Anaphylaxis    Has patient had a PCN reaction causing immediate rash, facial/tongue/throat swelling, SOB or lightheadedness with hypotension: Yes Has patient had a PCN reaction causing severe rash involving mucus membranes or skin necrosis: Yes Has patient had a PCN reaction that required hospitalization Yes Has patient had a PCN reaction occurring within the last 10 years: No If all of the above answers are "NO", then may proceed with Cephalosporin use.    Codeine Hives and Shortness Of Breath    Tolerates morphine, fentanyl, hydromorphone, hydrocodone, and oxycodone.   Sulfa Antibiotics Itching and Rash   Sulfonamide Derivatives Itching and Rash    Current Medications:  Current Outpatient Medications:    buPROPion (WELLBUTRIN SR) 150 MG 12 hr tablet, Take 150 mg by mouth 2 (two) times daily., Disp: , Rfl:    Cholecalciferol 1.25 MG (50000 UT) capsule, cholecalciferol (vitamin D3) 1,250 mcg (50,000 unit) capsule  TAKE 1  CAPSULE BY MOUTH ONCE A WEEK, Disp: , Rfl:    cyanocobalamin (,VITAMIN B-12,) 1000 MCG/ML injection, Inject 1,000 mcg into the muscle every 30 (thirty) days., Disp: , Rfl:    estradiol (ESTRACE) 0.1 MG/GM vaginal cream, estradiol 0.01% (0.1 mg/gram) vaginal cream, Disp: , Rfl:    levocetirizine (XYZAL) 5 MG tablet, Take 5 mg by mouth at bedtime., Disp: , Rfl:    levothyroxine (SYNTHROID) 112 MCG tablet, Take 112 mcg by mouth daily before breakfast., Disp: , Rfl:    MOUNJARO 2.5 MG/0.5ML Pen, SMARTSIG:2.5 Milligram(s) SUB-Q Once a Week, Disp: , Rfl:     Multiple Vitamin (MULTIVITAMIN) tablet, Take 1 tablet by mouth daily., Disp: , Rfl:  No current facility-administered medications for this visit.  Facility-Administered Medications Ordered in Other Visits:    famotidine (PEPCID) IVPB 20 mg premix, 20 mg, Intravenous, Once, [name], MD   sodium chloride 0.9 % 1,000 mL with thiamine 245 mg, folic acid 1 mg, M.V.I. Adult 10 mL infusion, , Intravenous, Once, [name], MD   sodium chloride 0.9 % bolus 1,000 mL, 1,000 mL, Intravenous, Once, [name], MD  Past Medical Problems: Past Medical History:  Diagnosis Date   Allergic rhinitis    Anemia    Anxiety    Aortic dilatation (HCC)    Asthma    Cervical cancer ([place])    Chronic back pain    Chronic cough    Depression    Diverticulosis    Essential hypertension    GERD (gastroesophageal reflux disease)    History of bronchitis    History of pneumonia    Hx of adenomatous and sessile serrated colonic polyps    Hyperlipidemia    Hypothyroidism    Lumbosacral spondylosis without myelopathy    Bulging disc - chronic pain   Obesity    Bariatric surgery   Obstructive sleep apnea    PONV (postoperative nausea and vomiting)    Restless leg    Tachycardia    Type 2 diabetes mellitus ([place])    Vitamin D deficiency    Vocal cord dysfunction     Past Surgical History: Past Surgical History:  Procedure Laterality Date   ABDOMINAL HYSTERECTOMY  1996   CHOLECYSTECTOMY  1994   COLONOSCOPY WITH PROPOFOL N/A 03/10/2015   Procedure: COLONOSCOPY WITH PROPOFOL;  Surgeon: [name], MD;  [facility];  Service: Endoscopy;  Laterality: N/A;   ESOPHAGOGASTRODUODENOSCOPY N/A 10/27/2013   Procedure: ESOPHAGOGASTRODUODENOSCOPY (EGD);  Surgeon: [name], MD;  Location: [facility];  Service: Endoscopy;  Laterality: N/A;   GASTRIC ROUX-EN-Y N/A 06/26/2021   Procedure: LAPAROSCOPIC ROUX-EN-Y GASTRIC BYPASS WITH UPPER ENDOSCOPY;  Surgeon: [name], MD;  [facility];  Service: General;  Laterality: N/A;   HIATAL HERNIA REPAIR N/A 06/26/2021   Procedure: HERNIA REPAIR HIATAL;  Surgeon: [name], MD;  [facility];  Service: General;  Laterality: N/A;   KNEE ARTHROSCOPY Right    LAPAROSCOPIC GASTRIC SLEEVE RESECTION WITH HIATAL HERNIA REPAIR N/A 09/19/2015   Procedure: LAPAROSCOPIC GASTRIC SLEEVE RESECTION WITH HIATAL HERNIA REPAIR;  Surgeon: [name], MD;  [facility];  Service: General;  Laterality: N/A;   LEFT HEART CATH AND CORONARY ANGIOGRAPHY N/A 05/17/2019   Procedure: LEFT HEART CATH AND CORONARY ANGIOGRAPHY;  Surgeon: Jettie Booze, MD;  Location: [facility] [facility];  Service: Cardiovascular;  Laterality: N/A;   LIPOSUCTION N/A 11/26/2018   Procedure: LIPOSUCTION;  Surgeon: Wallace Going, DO;  Location: [facility];  Service: Clinical cytogeneticist;  Laterality: N/A;   LIPOSUCTION N/A 11/02/2020   Procedure: with liposuction;  Surgeon: Wallace Going, DO;  Location: [place];  Service: Plastics;  Laterality: N/A;   MASS EXCISION N/A 11/02/2020   Procedure: Excision of suprapubic area;  Surgeon: Wallace Going, DO;  Location: [facility];  Service: Plastics;  Laterality: N/A;  90 min   NOSE SURGERY  2004   ovaries removed     PANNICULECTOMY N/A 11/26/2018   Procedure: PANNICULECTOMY;  Surgeon: Wallace Going, DO;  Location: [facility];  Service: Plastics;  Laterality: N/A;   TUBAL LIGATION     UPPER GI ENDOSCOPY  09/19/2015   Procedure: UPPER GI ENDOSCOPY;  Surgeon: [name], MD;  [facility];  Service: General;;    Social History: Social History   Socioeconomic History   Marital status: Divorced    Spouse name: Not on file   Number of children: 3   Years of education: college   Highest education level: Not on file  Occupational History   Occupation: disabled    Fish farm manager: [employer]  Tobacco Use   Smoking status: Never   Smokeless tobacco:  Never  Vaping Use   Vaping Use: Never used  Substance and Sexual Activity   Alcohol use: No   Drug use: No   Sexual activity: Yes    Birth control/protection: Surgical  Other Topics Concern   Not on file  Social History Narrative   Disabled   Prior Conservator, museum/gallery and [employer]   Social Determinants of Health   Financial Resource Strain: Not on file  Food Insecurity: Not on file  Transportation Needs: Not on file  Physical Activity: Not on file  Stress: Not on file  Social Connections: Not on file  Intimate Partner Violence: Not on file    Family History: Family History  Problem Relation Age of Onset   Emphysema Mother    Allergies Mother    Heart disease Mother    Asthma Mother    Ovarian cancer Mother    Hypertension Mother    Diabetes Mother    High Cholesterol Mother    Stroke Mother    Thyroid disease Mother    Depression Mother    Anxiety disorder Mother    Heart disease Father        deceased at age 27 from heart attack   Sudden death Father    High Cholesterol Father    High blood pressure Father    Hyperthyroidism Sister    COPD Sister        died at 36   Sudden death Sister    Thyroid disease Brother        died at 67   Heart attack Brother        died at 76   COPD Brother    Colon cancer Brother 26   Hypertension Brother    Asthma Brother    Asthma Brother    Lung cancer Maternal Aunt    Stroke Maternal Grandmother    Heart disease Maternal Grandmother    Cancer Maternal Grandfather    Heart disease Paternal Grandmother    Multiple sclerosis Paternal Grandmother    Heart attack Paternal Grandfather    Hypertension Daughter    Asthma Son    Asthma Son    Allergies Child    Esophageal cancer Neg Hx     Review of Systems: No recent changes in interim health  Physical Exam: Vital Signs BP 115/78 (BP Location: Left Arm, Patient Position: Sitting, Cuff Size: Large)   Pulse 82   Ht '5\' 3"'$  (1.6 m)   Wt 193 lb (87.5 kg)    SpO2 96%   BMI 34.19 kg/m   Physical Exam  Constitutional:      General: Not in acute distress.    Appearance: Normal appearance. Not ill-appearing.  HENT:     Head: Normocephalic and atraumatic.  Eyes:     Pupils: Pupils are equal, round Neck:     Musculoskeletal: Normal range of motion.  Cardiovascular:     Rate and Rhythm: Normal rate Pulmonary:     Effort: Pulmonary effort is normal. No respiratory distress.  Abdominal:     General: Abdomen is flat. There is no distension.  Musculoskeletal: Ambulatory Lower Extremities: No varicose veins noted.  No swelling. Skin:    General: Skin is warm and dry.     Findings: No erythema or rash.  Neurological:     Mental Status: Alert and oriented to person, place, and time. Mental status is at baseline.  Psychiatric:        Mood and Affect: Mood normal.        Behavior: Behavior normal.    Assessment/Plan: The patient is scheduled for excision of bilateral axillary tissue with Dr. Marla Roe.  Risks, benefits, and alternatives of procedure discussed, questions answered and consent obtained.    Smoking Status: Nonsmoker; Counseling Given? N/A Last Mammogram: Patient reports it was last year and the results were normal  Caprini Score: 4; Risk Factors include: Age, BMI > 25, and length of planned surgery. Recommendation for mechanical prophylaxis. Encourage early ambulation.   Pictures obtained: 01/01/22 at consult  Patient requesting liquid medications. Patient reports she has taken oxycodone in the past without issue.  Post-op Rx sent to pharmacy: Oxycodone, Zofran, Clindamycin   We will send for surgical clearance to patient's cardiologist.   Discussed with patient to hold any multivitamins or herbal teas one week prior to surgery. Told patient to hold estradiol cream 2 weeks prior to surgery. Discussed with patient she may continue Vitamin D.   Patient was provided with the breast reduction and General Surgical Risk consent  document and Pain Medication Agreement prior to their appointment.  They had adequate time to read through the risk consent documents and Pain Medication Agreement. We also discussed them in person together during this preop appointment. All of their questions were answered to their satisfaction.  Recommended calling if they have any further questions.  Risk consent form and Pain Medication Agreement to be scanned into patient's chart.  The risk that can be encountered with breast surgery were discussed and include the following but not limited to these:  Breast asymmetry, fluid accumulation, firmness of the breast, inability to breast feed, loss of nipple or areola, skin loss, decrease or no nipple sensation, fat necrosis of the breast tissue, bleeding, infection, healing delay.  There are risks of anesthesia, changes to skin sensation and injury to nerves or blood vessels.  The muscle can be temporarily or permanently injured.  You may have an allergic reaction to tape, suture, glue, blood products which can result in skin discoloration, swelling, pain, skin lesions, poor healing.  Any of these can lead to the need for revisonal surgery or stage procedures.  A reduction has potential to interfere with diagnostic procedures.  Nipple or breast piercing can increase risks of infection.  This procedure is best done when the  breast is fully developed.  Changes in the breast will continue to occur over time.  Pregnancy can alter the outcomes of previous breast reduction surgery, weight gain and weigh loss can also effect the long term appearance.     Electronically signed by: Clance Boll, PA-C 03/12/2022 4:49 PM

## 2022-03-11 NOTE — H&P (View-Only) (Signed)
Patient ID: Kristina [name], female    DOB: 05-31-1971, 51 y.o.   MRN: [id number]  Chief Complaint  Patient presents with   Pre-op Exam      ICD-10-CM   1. Lipodystrophy  E88.1        History of Present Illness: Kristina [name] is a 51 y.o.  female  with a history of gastric bypass, lipodystrophy and T2DM.  She presents for preoperative evaluation for upcoming procedure, excision of bilateral axillary tissue, scheduled for 03/28/20 with Dr. Marla Roe.  The patient has had problems with anesthesia. Patient reports she has felt nauseous after anesthesia in the past but states scopolamine patch has helped in the past.  Patient denies any personal or family history of breast cancer.  Patient reports she does have an enlarged aorta.  She states that she had an echo 1 month ago that appeared the same.  She states that she follows up with Dr. Domenic Polite for this.  Patient denies any use of birth control or hormone replacement.  She reports she had 1 miscarriage.  She denies any personal or family history of clots.  She denies any recent major surgeries, traumas, infections, pregnancies, strokes or heart attacks.  She denies any history of inflammatory bowel disease.  She does report she has asthma but she has not used an inhaler for 2 years.  She denies COPD.  She denies any cancer.    Summary of Previous Visit: Patient was seen by Dr. Marla Roe 01/01/2022.  At this visit, patient reported she lost approximately 100 pounds.  She was found to have a severe amount of excess tissue in the axilla that has a lot of skin chafing and irritation.  Patient reported she gets rashes and the excess skin becomes particularly uncomfortable in the summer time.  She reported she was unable to put her arms down due to the excess skin and tissue.  Job: Patient works in [employer] 3 times a week.  We will plan to have off her 2 weeks.  Discussed with patient that she may submit any FMLA to [place] if needed.  PMH  Significant for: Asthma, sleep apnea, GERD, hyperlipidemia, hypothyroidism, gastric bypass.  Patient states she used to be prediabetic but reports it is now controlled with diet and exercise.  She states her last A1c was less than 6 months ago and she states her A1c usually 5.  Most recent A1c in the chart is from 12/11/21 found to be 5.4.   Past Medical History: Allergies: Allergies  Allergen Reactions   Ampicillin Anaphylaxis    Has patient had a PCN reaction causing immediate rash, facial/tongue/throat swelling, SOB or lightheadedness with hypotension: Yes Has patient had a PCN reaction causing severe rash involving mucus membranes or skin necrosis: Yes Has patient had a PCN reaction that required hospitalization Yes Has patient had a PCN reaction occurring within the last 10 years: No If all of the above answers are "NO", then may proceed with Cephalosporin use.    Codeine Hives and Shortness Of Breath    Tolerates morphine, fentanyl, hydromorphone, hydrocodone, and oxycodone.   Sulfa Antibiotics Itching and Rash   Sulfonamide Derivatives Itching and Rash    Current Medications:  Current Outpatient Medications:    buPROPion (WELLBUTRIN SR) 150 MG 12 hr tablet, Take 150 mg by mouth 2 (two) times daily., Disp: , Rfl:    Cholecalciferol 1.25 MG (50000 UT) capsule, cholecalciferol (vitamin D3) 1,250 mcg (50,000 unit) capsule  TAKE 1  CAPSULE BY MOUTH ONCE A WEEK, Disp: , Rfl:    cyanocobalamin (,VITAMIN B-12,) 1000 MCG/ML injection, Inject 1,000 mcg into the muscle every 30 (thirty) days., Disp: , Rfl:    estradiol (ESTRACE) 0.1 MG/GM vaginal cream, estradiol 0.01% (0.1 mg/gram) vaginal cream, Disp: , Rfl:    levocetirizine (XYZAL) 5 MG tablet, Take 5 mg by mouth at bedtime., Disp: , Rfl:    levothyroxine (SYNTHROID) 112 MCG tablet, Take 112 mcg by mouth daily before breakfast., Disp: , Rfl:    MOUNJARO 2.5 MG/0.5ML Pen, SMARTSIG:2.5 Milligram(s) SUB-Q Once a Week, Disp: , Rfl:     Multiple Vitamin (MULTIVITAMIN) tablet, Take 1 tablet by mouth daily., Disp: , Rfl:  No current facility-administered medications for this visit.  Facility-Administered Medications Ordered in Other Visits:    famotidine (PEPCID) IVPB 20 mg premix, 20 mg, Intravenous, Once, [name], MD   sodium chloride 0.9 % 1,000 mL with thiamine 875 mg, folic acid 1 mg, M.V.I. Adult 10 mL infusion, , Intravenous, Once, [name], MD   sodium chloride 0.9 % bolus 1,000 mL, 1,000 mL, Intravenous, Once, [name], MD  Past Medical Problems: Past Medical History:  Diagnosis Date   Allergic rhinitis    Anemia    Anxiety    Aortic dilatation (HCC)    Asthma    Cervical cancer ([place])    Chronic back pain    Chronic cough    Depression    Diverticulosis    Essential hypertension    GERD (gastroesophageal reflux disease)    History of bronchitis    History of pneumonia    Hx of adenomatous and sessile serrated colonic polyps    Hyperlipidemia    Hypothyroidism    Lumbosacral spondylosis without myelopathy    Bulging disc - chronic pain   Obesity    Bariatric surgery   Obstructive sleep apnea    PONV (postoperative nausea and vomiting)    Restless leg    Tachycardia    Type 2 diabetes mellitus ([place])    Vitamin D deficiency    Vocal cord dysfunction     Past Surgical History: Past Surgical History:  Procedure Laterality Date   ABDOMINAL HYSTERECTOMY  1996   CHOLECYSTECTOMY  1994   COLONOSCOPY WITH PROPOFOL N/A 03/10/2015   Procedure: COLONOSCOPY WITH PROPOFOL;  Surgeon: [name], MD;  Location: [facility];  Service: Endoscopy;  Laterality: N/A;   ESOPHAGOGASTRODUODENOSCOPY N/A 10/27/2013   Procedure: ESOPHAGOGASTRODUODENOSCOPY (EGD);  Surgeon: [name], MD;  Location: [facility];  Service: Endoscopy;  Laterality: N/A;   GASTRIC ROUX-EN-Y N/A 06/26/2021   Procedure: LAPAROSCOPIC ROUX-EN-Y GASTRIC BYPASS WITH UPPER ENDOSCOPY;  Surgeon: [name], MD;  Location: [facility];  Service: General;  Laterality: N/A;   HIATAL HERNIA REPAIR N/A 06/26/2021   Procedure: HERNIA REPAIR HIATAL;  Surgeon: [name], MD;  Location: [facility];  Service: General;  Laterality: N/A;   KNEE ARTHROSCOPY Right    LAPAROSCOPIC GASTRIC SLEEVE RESECTION WITH HIATAL HERNIA REPAIR N/A 09/19/2015   Procedure: LAPAROSCOPIC GASTRIC SLEEVE RESECTION WITH HIATAL HERNIA REPAIR;  Surgeon: [name], MD;  Location: [facility];  Service: General;  Laterality: N/A;   LEFT HEART CATH AND CORONARY ANGIOGRAPHY N/A 05/17/2019   Procedure: LEFT HEART CATH AND CORONARY ANGIOGRAPHY;  Surgeon: Jettie Booze, MD;  Location: [facility];  Service: Cardiovascular;  Laterality: N/A;   LIPOSUCTION N/A 11/26/2018   Procedure: LIPOSUCTION;  Surgeon: Wallace Going, DO;  Location: [facility];  Service: Clinical cytogeneticist;  Laterality: N/A;   LIPOSUCTION N/A 11/02/2020   Procedure: with liposuction;  Surgeon: Wallace Going, DO;  Location: [facility];  Service: Plastics;  Laterality: N/A;   MASS EXCISION N/A 11/02/2020   Procedure: Excision of suprapubic area;  Surgeon: Wallace Going, DO;  Location: [facility];  Service: Plastics;  Laterality: N/A;  90 min   NOSE SURGERY  2004   ovaries removed     PANNICULECTOMY N/A 11/26/2018   Procedure: PANNICULECTOMY;  Surgeon: Wallace Going, DO;  Location: [facility];  Service: Plastics;  Laterality: N/A;   TUBAL LIGATION     UPPER GI ENDOSCOPY  09/19/2015   Procedure: UPPER GI ENDOSCOPY;  Surgeon: [name], MD;  Location: [facility];  Service: General;;    Social History: Social History   Socioeconomic History   Marital status: Divorced    Spouse name: Not on file   Number of children: 3   Years of education: college   Highest education level: Not on file  Occupational History   Occupation: disabled    Fish farm manager: [employer]  Tobacco Use   Smoking status: Never   Smokeless tobacco:  Never  Vaping Use   Vaping Use: Never used  Substance and Sexual Activity   Alcohol use: No   Drug use: No   Sexual activity: Yes    Birth control/protection: Surgical  Other Topics Concern   Not on file  Social History Narrative   Disabled   Prior Conservator, museum/gallery and [employer]   Social Determinants of Health   Financial Resource Strain: Not on file  Food Insecurity: Not on file  Transportation Needs: Not on file  Physical Activity: Not on file  Stress: Not on file  Social Connections: Not on file  Intimate Partner Violence: Not on file    Family History: Family History  Problem Relation Age of Onset   Emphysema Mother    Allergies Mother    Heart disease Mother    Asthma Mother    Ovarian cancer Mother    Hypertension Mother    Diabetes Mother    High Cholesterol Mother    Stroke Mother    Thyroid disease Mother    Depression Mother    Anxiety disorder Mother    Heart disease Father        deceased at age 80 from heart attack   Sudden death Father    High Cholesterol Father    High blood pressure Father    Hyperthyroidism Sister    COPD Sister        died at 43   Sudden death Sister    Thyroid disease Brother        died at 41   Heart attack Brother        died at 110   COPD Brother    Colon cancer Brother 33   Hypertension Brother    Asthma Brother    Asthma Brother    Lung cancer Maternal Aunt    Stroke Maternal Grandmother    Heart disease Maternal Grandmother    Cancer Maternal Grandfather    Heart disease Paternal Grandmother    Multiple sclerosis Paternal Grandmother    Heart attack Paternal Grandfather    Hypertension Daughter    Asthma Son    Asthma Son    Allergies Child    Esophageal cancer Neg Hx     Review of Systems: No recent changes in interim health  Physical Exam: Vital Signs BP 115/78 (BP Location: Left Arm, Patient Position: Sitting, Cuff Size: Large)   Pulse 82   Ht '5\' 3"'$  (1.6 m)   Wt 193 lb (87.5 kg)    SpO2 96%   BMI 34.19 kg/m   Physical Exam  Constitutional:      General: Not in acute distress.    Appearance: Normal appearance. Not ill-appearing.  HENT:     Head: Normocephalic and atraumatic.  Eyes:     Pupils: Pupils are equal, round Neck:     Musculoskeletal: Normal range of motion.  Cardiovascular:     Rate and Rhythm: Normal rate Pulmonary:     Effort: Pulmonary effort is normal. No respiratory distress.  Abdominal:     General: Abdomen is flat. There is no distension.  Musculoskeletal: Ambulatory Lower Extremities: No varicose veins noted.  No swelling. Skin:    General: Skin is warm and dry.     Findings: No erythema or rash.  Neurological:     Mental Status: Alert and oriented to person, place, and time. Mental status is at baseline.  Psychiatric:        Mood and Affect: Mood normal.        Behavior: Behavior normal.    Assessment/Plan: The patient is scheduled for excision of bilateral axillary tissue with Dr. Marla Roe.  Risks, benefits, and alternatives of procedure discussed, questions answered and consent obtained.    Smoking Status: Nonsmoker; Counseling Given? N/A Last Mammogram: Patient reports it was last year and the results were normal  Caprini Score: 4; Risk Factors include: Age, BMI > 25, and length of planned surgery. Recommendation for mechanical prophylaxis. Encourage early ambulation.   Pictures obtained: 01/01/22 at consult  Patient requesting liquid medications. Patient reports she has taken oxycodone in the past without issue.  Post-op Rx sent to pharmacy: Oxycodone, Zofran, Clindamycin   We will send for surgical clearance to patient's cardiologist.   Discussed with patient to hold any multivitamins or herbal teas one week prior to surgery. Told patient to hold estradiol cream 2 weeks prior to surgery. Discussed with patient she may continue Vitamin D.   Patient was provided with the breast reduction and General Surgical Risk consent  document and Pain Medication Agreement prior to their appointment.  They had adequate time to read through the risk consent documents and Pain Medication Agreement. We also discussed them in person together during this preop appointment. All of their questions were answered to their satisfaction.  Recommended calling if they have any further questions.  Risk consent form and Pain Medication Agreement to be scanned into patient's chart.  The risk that can be encountered with breast surgery were discussed and include the following but not limited to these:  Breast asymmetry, fluid accumulation, firmness of the breast, inability to breast feed, loss of nipple or areola, skin loss, decrease or no nipple sensation, fat necrosis of the breast tissue, bleeding, infection, healing delay.  There are risks of anesthesia, changes to skin sensation and injury to nerves or blood vessels.  The muscle can be temporarily or permanently injured.  You may have an allergic reaction to tape, suture, glue, blood products which can result in skin discoloration, swelling, pain, skin lesions, poor healing.  Any of these can lead to the need for revisonal surgery or stage procedures.  A reduction has potential to interfere with diagnostic procedures.  Nipple or breast piercing can increase risks of infection.  This procedure is best done when the  breast is fully developed.  Changes in the breast will continue to occur over time.  Pregnancy can alter the outcomes of previous breast reduction surgery, weight gain and weigh loss can also effect the long term appearance.     Electronically signed by: Clance Boll, PA-C 03/12/2022 4:49 PM

## 2022-03-12 ENCOUNTER — Ambulatory Visit (INDEPENDENT_AMBULATORY_CARE_PROVIDER_SITE_OTHER): Payer: Medicare Other | Admitting: Student

## 2022-03-12 ENCOUNTER — Encounter: Payer: Self-pay | Admitting: Student

## 2022-03-12 ENCOUNTER — Encounter: Payer: Self-pay | Admitting: Plastic Surgery

## 2022-03-12 ENCOUNTER — Encounter: Payer: Self-pay | Admitting: Hematology and Oncology

## 2022-03-12 VITALS — BP 115/78 | HR 82 | Ht 63.0 in | Wt 193.0 lb

## 2022-03-12 DIAGNOSIS — E881 Lipodystrophy, not elsewhere classified: Secondary | ICD-10-CM

## 2022-03-13 ENCOUNTER — Other Ambulatory Visit: Payer: Self-pay

## 2022-03-13 ENCOUNTER — Encounter (HOSPITAL_BASED_OUTPATIENT_CLINIC_OR_DEPARTMENT_OTHER): Payer: Self-pay | Admitting: Plastic Surgery

## 2022-03-13 ENCOUNTER — Telehealth: Payer: Self-pay

## 2022-03-13 ENCOUNTER — Encounter: Payer: Self-pay | Admitting: Hematology and Oncology

## 2022-03-13 MED ORDER — CLINDAMYCIN PALMITATE HCL 75 MG/5ML PO SOLR: 450.0000 mg | Freq: Three times a day (TID) | ORAL | 0 refills | Status: AC

## 2022-03-13 MED ORDER — OXYCODONE HCL 5 MG/5ML PO SOLN: 5.0000 mg | Freq: Three times a day (TID) | ORAL | 0 refills | Status: AC | PRN

## 2022-03-13 MED ORDER — ONDANSETRON 4 MG PO TBDP: 4.0000 mg | ORAL_TABLET | Freq: Three times a day (TID) | ORAL | 0 refills | Status: AC | PRN

## 2022-03-13 NOTE — Progress Notes (Signed)
Reviewed chart with Dr. Ermalene Postin regarding aortic dilation, okay to proceed with surgery at [facility].

## 2022-03-13 NOTE — Telephone Encounter (Signed)
Faxed surgical clearance to Dr. Rozann Lesches.

## 2022-03-14 ENCOUNTER — Encounter (HOSPITAL_BASED_OUTPATIENT_CLINIC_OR_DEPARTMENT_OTHER)
Admission: RE | Admit: 2022-03-14 | Discharge: 2022-03-14 | Disposition: A | Discharge status: Home / Self Care | Payer: Medicare Other | Source: Ambulatory Visit | Attending: Plastic Surgery | Admitting: Plastic Surgery

## 2022-03-14 DIAGNOSIS — Z01812 Encounter for preprocedural laboratory examination: Secondary | ICD-10-CM | POA: Insufficient documentation

## 2022-03-14 LAB — BASIC METABOLIC PANEL
Anion gap: 10 (ref 5–15)
BUN: 8 mg/dL (ref 6–20)
CO2: 24 mmol/L (ref 22–32)
Calcium: 9.4 mg/dL (ref 8.9–10.3)
Chloride: 108 mmol/L (ref 98–111)
Creatinine, Ser: 0.79 mg/dL (ref 0.44–1.00)
GFR, Estimated: >60 mL/min (ref >60)
Glucose, Bld: 95 mg/dL (ref 70–99)
Potassium: 3.8 mmol/L (ref 3.5–5.1)
Sodium: 142 mmol/L (ref 135–145)

## 2022-03-15 ENCOUNTER — Telehealth: Payer: Self-pay | Admitting: Cardiology

## 2022-03-15 NOTE — Telephone Encounter (Signed)
   Name: Kristina [name]  DOB: [birth year]  MRN: [id number]  Primary Cardiologist: Rozann Lesches, MD  Chart reviewed as part of pre-operative protocol coverage. Because of Kristina [name]'s past medical history and time since last visit, she will require a follow-up in-office visit in order to better assess preoperative cardiovascular risk.  Pre-op covering staff: - Please schedule appointment and call patient to inform them. If patient already had an upcoming appointment within acceptable timeframe, please add "pre-op clearance" to the appointment notes so provider is aware. - Please contact requesting surgeon's office via preferred method (i.e, phone, fax) to inform them of need for appointment prior to surgery.  No pharmacologic clearance has been requested.  Elgie Collard, PA-C  03/15/2022, 4:16 PM

## 2022-03-15 NOTE — Telephone Encounter (Signed)
I will send a message to the [place] office to see if they may please see where they can get the pt in for pre op clearance. Surgery is set for 03/28/22.

## 2022-03-15 NOTE — Telephone Encounter (Signed)
   Pre-operative Risk Assessment    Patient Name: Kristina [name]  DOB: Feb 19, 1971 MRN: [id number]      Request for Surgical Clearance    Procedure: Excision of bilateral axillary tittue  Date of Surgery:  Clearance 03/28/22                                 Surgeon:  Dr. Audelia Hives Surgeon's Group or Practice Name:  [facility] Phone number:  [phone] Fax number:  [phone] attention:Tracey Luis Abed CMA   Type of Clearance Requested:   - Medical    Type of Anesthesia:  General    Additional requests/questions:    SignedChanda Busing   03/15/2022, 11:35 AM

## 2022-03-18 ENCOUNTER — Encounter: Payer: Self-pay | Admitting: Cardiology

## 2022-03-18 ENCOUNTER — Ambulatory Visit (INDEPENDENT_AMBULATORY_CARE_PROVIDER_SITE_OTHER): Payer: Medicare Other | Admitting: Cardiology

## 2022-03-18 VITALS — BP 100/72 | HR 69 | Ht 63.0 in | Wt 194.8 lb

## 2022-03-18 DIAGNOSIS — Z01810 Encounter for preprocedural cardiovascular examination: Secondary | ICD-10-CM

## 2022-03-18 DIAGNOSIS — I77810 Thoracic aortic ectasia: Secondary | ICD-10-CM | POA: Diagnosis not present

## 2022-03-18 DIAGNOSIS — I351 Nonrheumatic aortic (valve) insufficiency: Secondary | ICD-10-CM

## 2022-03-18 NOTE — Progress Notes (Signed)
Cardiology Office Note  Date: 03/18/2022   ID: GIZELL DANSER, DOB 15-Jan-1971, MRN [id number]  PCP:  Pablo Lawrence, NP  Cardiologist:  Rozann Lesches, MD Electrophysiologist:  None   Chief Complaint  Patient presents with   Preoperative cardiac evaluation    History of Present Illness: Kristina [name] is a 51 y.o. female last seen in the office in October 2021.  She is referred back for preoperative cardiac assessment. She is scheduled to undergo bilateral axillary tissue excision under general anesthesia on July 13.  She has excess skin in that region following substantial weight loss of 130 to 140 pounds over time.  She underwent bariatric surgery previously.  Kristina [name] has a history of normal coronary arteries documented at cardiac catheterization in 2020.  She has had dilatation of the aortic root, last measured at 46 mm by echocardiogram in April of this year.  She is asymptomatic.  Blood pressure is normal today.  She does not report any limiting exertional symptoms.  I reviewed her recent ECG.  Past Medical History:  Diagnosis Date   Allergic rhinitis    Anemia    Anxiety    Aortic dilatation (HCC)    Asthma    Cervical cancer (HCC)    Chronic back pain    Chronic cough    Depression    Diverticulosis    Essential hypertension    GERD (gastroesophageal reflux disease)    History of bronchitis    History of pneumonia    Hx of adenomatous and sessile serrated colonic polyps    Hyperlipidemia    Hypothyroidism    Lumbosacral spondylosis without myelopathy    Bulging disc - chronic pain   Obesity    Bariatric surgery   Obstructive sleep apnea    PONV (postoperative nausea and vomiting)    Restless leg    Tachycardia    Type 2 diabetes mellitus ([place])    Vitamin D deficiency    Vocal cord dysfunction     Past Surgical History:  Procedure Laterality Date   ABDOMINAL HYSTERECTOMY  1996   CHOLECYSTECTOMY  1994   COLONOSCOPY WITH PROPOFOL N/A  03/10/2015   Procedure: COLONOSCOPY WITH PROPOFOL;  Surgeon: [name], MD;  Location: [facility];  Service: Endoscopy;  Laterality: N/A;   ESOPHAGOGASTRODUODENOSCOPY N/A 10/27/2013   Procedure: ESOPHAGOGASTRODUODENOSCOPY (EGD);  Surgeon: [name], MD;  Location: [facility];  Service: Endoscopy;  Laterality: N/A;   GASTRIC ROUX-EN-Y N/A 06/26/2021   Procedure: LAPAROSCOPIC ROUX-EN-Y GASTRIC BYPASS WITH UPPER ENDOSCOPY;  Surgeon: [name], MD;  Location: [facility];  Service: General;  Laterality: N/A;   HIATAL HERNIA REPAIR N/A 06/26/2021   Procedure: HERNIA REPAIR HIATAL;  Surgeon: [name], MD;  Location: [facility];  Service: General;  Laterality: N/A;   KNEE ARTHROSCOPY Right    LAPAROSCOPIC GASTRIC SLEEVE RESECTION WITH HIATAL HERNIA REPAIR N/A 09/19/2015   Procedure: LAPAROSCOPIC GASTRIC SLEEVE RESECTION WITH HIATAL HERNIA REPAIR;  Surgeon: [name], MD;  Location: [facility];  Service: General;  Laterality: N/A;   LEFT HEART CATH AND CORONARY ANGIOGRAPHY N/A 05/17/2019   Procedure: LEFT HEART CATH AND CORONARY ANGIOGRAPHY;  Surgeon: Jettie Booze, MD;  Location: [facility];  Service: Cardiovascular;  Laterality: N/A;   LIPOSUCTION N/A 11/26/2018   Procedure: LIPOSUCTION;  Surgeon: Wallace Going, DO;  Location: [facility];  Service: Plastics;  Laterality: N/A;   LIPOSUCTION N/A 11/02/2020   Procedure: with liposuction;  Surgeon: Marla Roe,  Loel Lofty, DO;  Location: [facility];  Service: Plastics;  Laterality: N/A;   MASS EXCISION N/A 11/02/2020   Procedure: Excision of suprapubic area;  Surgeon: Wallace Going, DO;  Location: [facility];  Service: Plastics;  Laterality: N/A;  90 min   NOSE SURGERY  2004   ovaries removed     PANNICULECTOMY N/A 11/26/2018   Procedure: PANNICULECTOMY;  Surgeon: Wallace Going, DO;  Location: [facility];  Service: Plastics;  Laterality: N/A;   TUBAL LIGATION      UPPER GI ENDOSCOPY  09/19/2015   Procedure: UPPER GI ENDOSCOPY;  Surgeon: [name], MD;  Location: [facility];  Service: General;;    Current Outpatient Medications  Medication Sig Dispense Refill   buPROPion (WELLBUTRIN SR) 150 MG 12 hr tablet Take 150 mg by mouth 2 (two) times daily.     Cholecalciferol 1.25 MG (50000 UT) capsule cholecalciferol (vitamin D3) 1,250 mcg (50,000 unit) capsule  TAKE 1 CAPSULE BY MOUTH ONCE A WEEK     cyanocobalamin (,VITAMIN B-12,) 1000 MCG/ML injection Inject 1,000 mcg into the muscle every 30 (thirty) days.     estradiol (ESTRACE) 0.1 MG/GM vaginal cream estradiol 0.01% (0.1 mg/gram) vaginal cream     levocetirizine (XYZAL) 5 MG tablet Take 5 mg by mouth at bedtime.     levothyroxine (SYNTHROID) 112 MCG tablet Take 112 mcg by mouth daily before breakfast.     MOUNJARO 2.5 MG/0.5ML Pen SMARTSIG:2.5 Milligram(s) SUB-Q Once a Week     Multiple Vitamin (MULTIVITAMIN) tablet Take 1 tablet by mouth daily.     ondansetron (ZOFRAN-ODT) 4 MG disintegrating tablet Take 1 tablet (4 mg total) by mouth every 8 (eight) hours as needed for up to 20 doses for nausea or vomiting. 20 tablet 0   oxyCODONE (ROXICODONE) 5 MG/5ML solution Take 5 mLs (5 mg total) by mouth every 8 (eight) hours as needed for up to 7 days for severe pain. (Patient not taking: Reported on 03/18/2022) 100 mL 0   No current facility-administered medications for this visit.   Facility-Administered Medications Ordered in Other Visits  Medication Dose Route Frequency Provider Last Rate Last Admin   famotidine (PEPCID) IVPB 20 mg premix  20 mg Intravenous Once [name], MD       sodium chloride 0.9 % 1,000 mL with thiamine 938 mg, folic acid 1 mg, M.V.I. Adult 10 mL infusion   Intravenous Once [name], MD       sodium chloride 0.9 % bolus 1,000 mL  1,000 mL Intravenous Once [name], MD       Allergies:  Ampicillin, Codeine, Sulfa antibiotics, and Sulfonamide derivatives   ROS: No  syncope.  Physical Exam: VS:  BP 100/72   Pulse 69   Ht '5\' 3"'$  (1.6 m)   Wt 194 lb 12.8 oz (88.4 kg)   SpO2 96%   BMI 34.51 kg/m , BMI Body mass index is 34.51 kg/m.  Wt Readings from Last 3 Encounters:  03/18/22 194 lb 12.8 oz (88.4 kg)  03/12/22 193 lb (87.5 kg)  01/01/22 195 lb (88.5 kg)    General: Patient appears comfortable at rest. HEENT: Conjunctiva and lids normal, oropharynx clear. Neck: Supple, no elevated JVP or carotid bruits, no thyromegaly. Lungs: Clear to auscultation, nonlabored breathing at rest. Cardiac: Regular rate and rhythm, no S3, 1/6 systolic murmur. Extremities: No pitting edema.  ECG:  An ECG dated 03/14/2022 was personally reviewed today and demonstrated:  Sinus rhythm  with prolonged PR interval, decreased R wave progression.  Recent Labwork: 09/07/2021: ALT 20; AST 15 10/02/2021: Hemoglobin 12.8; Platelet Count 163 03/14/2022: BUN 8; Creatinine, Ser 0.79; Potassium 3.8; Sodium 142     Component Value Date/Time   CHOL 205 (H) 05/17/2019 0447   CHOL 233 (H) 02/05/2018 1055   TRIG 114 05/17/2019 0447   HDL 44 05/17/2019 0447   HDL 51 02/05/2018 1055   CHOLHDL 4.7 05/17/2019 0447   VLDL 23 05/17/2019 0447   LDLCALC 138 (H) 05/17/2019 0447   LDLCALC 151 (H) 02/05/2018 1055    Other Studies Reviewed Today:  Cardiac catheterization 05/17/2019: The left ventricular systolic function is normal. LV end diastolic pressure is normal. The left ventricular ejection fraction is 55-65% by visual estimate. There is no aortic valve stenosis.   No significant CAD.  Continue preventive therapy.   Chest CTA 07/07/2020: FINDINGS: Cardiovascular:   Heart:   Heart size unchanged. No pericardial fluid/thickening. No significant coronary calcifications.   Aorta:   No significant aortic valve calcifications.   Estimates are made on this non gated CT study.   Estimated diameter of the annulus on the coronal images 27 mm.   Estimated diameter of the  sino-tubular junction on the coronal images, 31-32 mm.   Estimated diameter of the ascending aorta on the axial images, 31-32 mm.   Three vessel arch with patency of the branch vessels. No significant atherosclerotic changes.   Unremarkable appearance of the distal thoracic aorta with normal course caliber and contour.   Pulmonary arteries:   Timing of the contrast bolus not optimized for evaluation of the pulmonary arteries. No filling defects are identified within the proximal pulmonary arteries.   Mediastinum/Nodes: Unremarkable thoracic inlet. No mediastinal adenopathy.   Unremarkable thoracic esophagus.  Small hiatal hernia.   Lungs/Pleura: Central airways are clear. No pleural effusion. No confluent airspace disease.   No pneumothorax.   Upper Abdomen: No acute. Surgical changes of the stomach. Cholecystectomy.   Musculoskeletal: No acute displaced fracture. Degenerative changes of the spine.   Review of the MIP images confirms the above findings.   IMPRESSION: CT angiogram of the chest is negative for thoracic aortic aneurysm on this non gated study, as measured above.  Echocardiogram 12/20/2021:   1. Left ventricular ejection fraction, by estimation, is 55 to 60%. The  left ventricle has normal function. The left ventricle has no regional  wall motion abnormalities. Left ventricular diastolic parameters are  consistent with Grade I diastolic  dysfunction (impaired relaxation).   2. Right ventricular systolic function is normal. The right ventricular  size is normal. There is normal pulmonary artery systolic pressure. The  estimated right ventricular systolic pressure is 98.9 mmHg.   3. The mitral valve is grossly normal. Trivial mitral valve  regurgitation.   4. The aortic valve is tricuspid. Aortic valve regurgitation is trivial.  No aortic stenosis is present. Aortic valve mean gradient measures 3.0  mmHg.   5. Aortic dilatation noted. There is moderate  dilatation of the aortic  root, measuring 46 mm. There is mild dilatation of the ascending aorta,  measuring 37 mm.   6. The inferior vena cava is normal in size with greater than 50%  respiratory variability, suggesting right atrial pressure of 3 mmHg.    Assessment and Plan:  1.  Preoperative cardiac evaluation in a 51 year old woman with history of normal coronary arteries in 2020 as well as asymptomatic dilated aortic root most recently measuring 46 mm.  Blood pressure  is normal today.  I reviewed her recent ECG.  She reports no limiting symptoms with exertion.  She should be able to proceed with planned excision of excess skin in the axillary region under general anesthesia at relatively low perioperative cardiac risk.  2.  Dilated aortic root, 46 mm by echocardiogram this year at asymptomatic.  We will see her back for reimaging in 1 year.  Medication Adjustments/Labs and Tests Ordered: Current medicines are reviewed at length with the patient today.  Concerns regarding medicines are outlined above.   Tests Ordered: Orders Placed This Encounter  Procedures   ECHOCARDIOGRAM COMPLETE    Medication Changes: No orders of the defined types were placed in this encounter.   Disposition:  Follow up  1 year.  Signed, Satira Sark, MD, [facility] 03/18/2022 2:13 PM    [facility] [place], [place], [place] [address] Phone: [phone]; Fax: [phone]

## 2022-03-18 NOTE — Telephone Encounter (Signed)
Scheduler just sent me a message they had a cancellation and the pt is coming in today. I will update the requesting office pt has appt today.

## 2022-03-18 NOTE — Patient Instructions (Signed)
Medication Instructions:  Your physician recommends that you continue on your current medications as directed. Please refer to the Current Medication list given to you today.   Labwork: none  Testing/Procedures: Your physician has requested that you have an echocardiogram. Echocardiography is a painless test that uses sound waves to create images of your heart. It provides your doctor with information about the size and shape of your heart and how well your heart's chambers and valves are working. This procedure takes approximately one hour. There are no restrictions for this procedure.   Follow-Up: Your physician recommends that you schedule a follow-up appointment in: 1 year  Any Other Special Instructions Will Be Listed Below (If Applicable).  You will receive a reminder call in about 10 months reminding you to schedule your appointment. If you don't receive this call, please contact our office.  If you need a refill on your cardiac medications before your next appointment, please call your pharmacy.

## 2022-03-18 NOTE — Telephone Encounter (Signed)
Slaughter, Vicky T  Miniya Miguez, Erick Blinks, CMA Grace Blight,   The 1st available appointment for  [place] office is 04-30-2022. Looks like she had an appointment with Dr. Domenic Polite but was a no show.   Not sure what to do.   Thanks.   Vicky   I have asked the scheduler to please call the pt with the 04/30/22 appt. I will update the requesting office the pt procedure will ned to be post poned until she has been seen by cardiology. Pt did not show for her appt 01/31/22 with Dr. Domenic Polite.

## 2022-03-22 ENCOUNTER — Encounter: Payer: Self-pay | Admitting: Student

## 2022-03-22 NOTE — Progress Notes (Signed)
Surgical Clearance has been received from patient's cardiologist, Dr. Domenic Polite for patient's upcoming surgery with Dr. Marla Roe.  Per Dr. Domenic Polite, patient should be able to proceed with planned excision of excess skin in the axillary region under general anesthesia at relatively low perioperative cardiac risk

## 2022-03-28 ENCOUNTER — Encounter (HOSPITAL_BASED_OUTPATIENT_CLINIC_OR_DEPARTMENT_OTHER)
Admission: RE | Disposition: A | Discharge status: Home / Self Care | Payer: Self-pay | Source: Ambulatory Visit | Attending: Plastic Surgery

## 2022-03-28 ENCOUNTER — Ambulatory Visit (HOSPITAL_BASED_OUTPATIENT_CLINIC_OR_DEPARTMENT_OTHER): Payer: Medicare Other | Admitting: Anesthesiology

## 2022-03-28 ENCOUNTER — Other Ambulatory Visit: Payer: Self-pay

## 2022-03-28 ENCOUNTER — Ambulatory Visit: Payer: Medicare Other | Admitting: Skilled Nursing Facility1

## 2022-03-28 ENCOUNTER — Ambulatory Visit (HOSPITAL_BASED_OUTPATIENT_CLINIC_OR_DEPARTMENT_OTHER)
Admission: RE | Admit: 2022-03-28 | Discharge: 2022-03-28 | Disposition: A | Discharge status: Home / Self Care | Payer: Medicare Other | Source: Ambulatory Visit | Attending: Plastic Surgery | Admitting: Plastic Surgery

## 2022-03-28 ENCOUNTER — Encounter (HOSPITAL_BASED_OUTPATIENT_CLINIC_OR_DEPARTMENT_OTHER): Payer: Self-pay | Admitting: Plastic Surgery

## 2022-03-28 DIAGNOSIS — G4730 Sleep apnea, unspecified: Secondary | ICD-10-CM | POA: Insufficient documentation

## 2022-03-28 DIAGNOSIS — L987 Excessive and redundant skin and subcutaneous tissue: Secondary | ICD-10-CM

## 2022-03-28 DIAGNOSIS — I10 Essential (primary) hypertension: Secondary | ICD-10-CM | POA: Insufficient documentation

## 2022-03-28 DIAGNOSIS — E119 Type 2 diabetes mellitus without complications: Secondary | ICD-10-CM | POA: Diagnosis not present

## 2022-03-28 DIAGNOSIS — Z9884 Bariatric surgery status: Secondary | ICD-10-CM | POA: Insufficient documentation

## 2022-03-28 DIAGNOSIS — Q831 Accessory breast: Secondary | ICD-10-CM | POA: Diagnosis not present

## 2022-03-28 DIAGNOSIS — I25119 Atherosclerotic heart disease of native coronary artery with unspecified angina pectoris: Secondary | ICD-10-CM

## 2022-03-28 DIAGNOSIS — K219 Gastro-esophageal reflux disease without esophagitis: Secondary | ICD-10-CM | POA: Diagnosis not present

## 2022-03-28 DIAGNOSIS — J45909 Unspecified asthma, uncomplicated: Secondary | ICD-10-CM | POA: Insufficient documentation

## 2022-03-28 DIAGNOSIS — E039 Hypothyroidism, unspecified: Secondary | ICD-10-CM | POA: Insufficient documentation

## 2022-03-28 DIAGNOSIS — M1990 Unspecified osteoarthritis, unspecified site: Secondary | ICD-10-CM | POA: Diagnosis not present

## 2022-03-28 DIAGNOSIS — F32A Depression, unspecified: Secondary | ICD-10-CM | POA: Insufficient documentation

## 2022-03-28 DIAGNOSIS — I2510 Atherosclerotic heart disease of native coronary artery without angina pectoris: Secondary | ICD-10-CM | POA: Insufficient documentation

## 2022-03-28 DIAGNOSIS — E881 Lipodystrophy, not elsewhere classified: Secondary | ICD-10-CM | POA: Insufficient documentation

## 2022-03-28 DIAGNOSIS — Z7985 Long-term (current) use of injectable non-insulin antidiabetic drugs: Secondary | ICD-10-CM | POA: Diagnosis not present

## 2022-03-28 DIAGNOSIS — D638 Anemia in other chronic diseases classified elsewhere: Secondary | ICD-10-CM | POA: Diagnosis not present

## 2022-03-28 HISTORY — PX: GYNECOMASTIA MASTECTOMY: SHX5265

## 2022-03-28 LAB — GLUCOSE, CAPILLARY
Glucose-Capillary: 87 mg/dL (ref 70–99)
Glucose-Capillary: 100 mg/dL — ABNORMAL HIGH (ref 70–99)

## 2022-03-28 SURGERY — MASTECTOMY, FOR GYNECOMASTIA
Anesthesia: General | Site: Arm Upper | Laterality: Bilateral

## 2022-03-28 MED ORDER — ONDANSETRON HCL 4 MG/2ML IJ SOLN
INTRAMUSCULAR | Status: AC
Start: 2022-03-28 — End: ?
  Filled 2022-03-28: qty 2

## 2022-03-28 MED ORDER — SCOPOLAMINE 1 MG/3DAYS TD PT72
MEDICATED_PATCH | TRANSDERMAL | Status: AC
  Filled 2022-03-28: qty 1

## 2022-03-28 MED ORDER — FENTANYL CITRATE (PF) 100 MCG/2ML IJ SOLN
INTRAMUSCULAR | Status: AC
  Filled 2022-03-28: qty 2

## 2022-03-28 MED ORDER — ACETAMINOPHEN 325 MG RE SUPP: 650.0000 mg | RECTAL | Status: DC | PRN

## 2022-03-28 MED ORDER — CIPROFLOXACIN IN D5W 400 MG/200ML IV SOLN
INTRAVENOUS | Status: AC
  Filled 2022-03-28: qty 200

## 2022-03-28 MED ORDER — OXYCODONE HCL 5 MG PO TABS: 5.0000 mg | ORAL_TABLET | ORAL | Status: DC | PRN

## 2022-03-28 MED ORDER — SCOPOLAMINE 1 MG/3DAYS TD PT72
1.0000 | MEDICATED_PATCH | TRANSDERMAL | Status: DC
Start: 2022-03-28 — End: 2022-03-28
  Administered 2022-03-28: 1.5 mg via TRANSDERMAL

## 2022-03-28 MED ORDER — SODIUM CHLORIDE 0.9% FLUSH: 3.0000 mL | INTRAVENOUS | Status: DC | PRN

## 2022-03-28 MED ORDER — DEXMEDETOMIDINE (PRECEDEX) IN NS 20 MCG/5ML (4 MCG/ML) IV SYRINGE
PREFILLED_SYRINGE | INTRAVENOUS | Status: DC | PRN
  Administered 2022-03-28: 8 ug via INTRAVENOUS

## 2022-03-28 MED ORDER — LACTATED RINGERS IV SOLN: INTRAVENOUS | Status: DC

## 2022-03-28 MED ORDER — ATROPINE SULFATE 0.4 MG/ML IV SOLN
INTRAVENOUS | Status: AC
  Filled 2022-03-28: qty 1

## 2022-03-28 MED ORDER — DEXAMETHASONE SODIUM PHOSPHATE 4 MG/ML IJ SOLN
INTRAMUSCULAR | Status: DC | PRN
  Administered 2022-03-28: 10 mg via INTRAVENOUS

## 2022-03-28 MED ORDER — DEXMEDETOMIDINE HCL IN NACL 80 MCG/20ML IV SOLN
INTRAVENOUS | Status: AC
  Filled 2022-03-28: qty 20

## 2022-03-28 MED ORDER — AMISULPRIDE (ANTIEMETIC) 5 MG/2ML IV SOLN
10.0000 mg | Freq: Once | INTRAVENOUS | Status: AC | PRN
  Administered 2022-03-28: 10 mg via INTRAVENOUS

## 2022-03-28 MED ORDER — OXYCODONE HCL 5 MG/5ML PO SOLN: 5.0000 mg | Freq: Once | ORAL | Status: AC | PRN

## 2022-03-28 MED ORDER — SODIUM CHLORIDE 0.9 % IV SOLN: 250.0000 mL | INTRAVENOUS | Status: DC | PRN

## 2022-03-28 MED ORDER — CIPROFLOXACIN IN D5W 400 MG/200ML IV SOLN
400.0000 mg | INTRAVENOUS | Status: AC
Start: 2022-03-28 — End: 2022-03-28
  Administered 2022-03-28: 400 mg via INTRAVENOUS

## 2022-03-28 MED ORDER — EPHEDRINE SULFATE (PRESSORS) 50 MG/ML IJ SOLN
INTRAMUSCULAR | Status: DC | PRN
  Administered 2022-03-28: 10 mg via INTRAVENOUS

## 2022-03-28 MED ORDER — OXYCODONE HCL 5 MG PO TABS
ORAL_TABLET | ORAL | Status: AC
  Filled 2022-03-28: qty 1

## 2022-03-28 MED ORDER — EPHEDRINE 5 MG/ML INJ
INTRAVENOUS | Status: AC
  Filled 2022-03-28: qty 5

## 2022-03-28 MED ORDER — LIDOCAINE-EPINEPHRINE 1 %-1:100000 IJ SOLN
INTRAMUSCULAR | Status: DC | PRN
  Administered 2022-03-28: 37 mL

## 2022-03-28 MED ORDER — LIDOCAINE HCL (PF) 1 % IJ SOLN
INTRAMUSCULAR | Status: AC
  Filled 2022-03-28: qty 30

## 2022-03-28 MED ORDER — ACETAMINOPHEN 325 MG PO TABS: 650.0000 mg | ORAL_TABLET | ORAL | Status: DC | PRN

## 2022-03-28 MED ORDER — FENTANYL CITRATE (PF) 100 MCG/2ML IJ SOLN
INTRAMUSCULAR | Status: DC | PRN
  Administered 2022-03-28: 25 ug via INTRAVENOUS
  Administered 2022-03-28: 50 ug via INTRAVENOUS
  Administered 2022-03-28: 25 ug via INTRAVENOUS
  Administered 2022-03-28: 50 ug via INTRAVENOUS
  Administered 2022-03-28 (×2): 25 ug via INTRAVENOUS

## 2022-03-28 MED ORDER — ONDANSETRON HCL 4 MG/2ML IJ SOLN
INTRAMUSCULAR | Status: DC | PRN
  Administered 2022-03-28: 4 mg via INTRAVENOUS

## 2022-03-28 MED ORDER — ACETAMINOPHEN 500 MG PO TABS
1000.0000 mg | ORAL_TABLET | Freq: Once | ORAL | Status: AC
Start: 2022-03-28 — End: 2022-03-28
  Administered 2022-03-28: 1000 mg via ORAL

## 2022-03-28 MED ORDER — PROPOFOL 10 MG/ML IV BOLUS
INTRAVENOUS | Status: DC | PRN
  Administered 2022-03-28: 200 mg via INTRAVENOUS

## 2022-03-28 MED ORDER — MIDAZOLAM HCL 5 MG/5ML IJ SOLN
INTRAMUSCULAR | Status: DC | PRN
  Administered 2022-03-28: 2 mg via INTRAVENOUS

## 2022-03-28 MED ORDER — FENTANYL CITRATE (PF) 100 MCG/2ML IJ SOLN: 25.0000 ug | INTRAMUSCULAR | Status: DC | PRN

## 2022-03-28 MED ORDER — SODIUM CHLORIDE 0.9% FLUSH: 3.0000 mL | Freq: Two times a day (BID) | INTRAVENOUS | Status: DC

## 2022-03-28 MED ORDER — OXYCODONE HCL 5 MG PO TABS
5.0000 mg | ORAL_TABLET | Freq: Once | ORAL | Status: AC | PRN
  Administered 2022-03-28: 5 mg via ORAL

## 2022-03-28 MED ORDER — DEXAMETHASONE SODIUM PHOSPHATE 10 MG/ML IJ SOLN
INTRAMUSCULAR | Status: AC
  Filled 2022-03-28: qty 1

## 2022-03-28 MED ORDER — LIDOCAINE HCL (CARDIAC) PF 100 MG/5ML IV SOSY
PREFILLED_SYRINGE | INTRAVENOUS | Status: DC | PRN
  Administered 2022-03-28: 60 mg via INTRAVENOUS

## 2022-03-28 MED ORDER — ONDANSETRON HCL 4 MG/2ML IJ SOLN: 4.0000 mg | Freq: Once | INTRAMUSCULAR | Status: DC | PRN

## 2022-03-28 MED ORDER — LIDOCAINE HCL 1 % IJ SOLN
INTRAVENOUS | Status: DC | PRN
  Administered 2022-03-28: 1000 mL

## 2022-03-28 MED ORDER — FENTANYL CITRATE (PF) 100 MCG/2ML IJ SOLN
25.0000 ug | INTRAMUSCULAR | Status: DC | PRN
  Administered 2022-03-28 (×3): 50 ug via INTRAVENOUS

## 2022-03-28 MED ORDER — CHLORHEXIDINE GLUCONATE CLOTH 2 % EX PADS: 6.0000 | MEDICATED_PAD | Freq: Once | CUTANEOUS | Status: DC

## 2022-03-28 MED ORDER — ACETAMINOPHEN 500 MG PO TABS
ORAL_TABLET | ORAL | Status: AC
  Filled 2022-03-28: qty 2

## 2022-03-28 MED ORDER — PHENYLEPHRINE 80 MCG/ML (10ML) SYRINGE FOR IV PUSH (FOR BLOOD PRESSURE SUPPORT)
PREFILLED_SYRINGE | INTRAVENOUS | Status: AC
  Filled 2022-03-28: qty 10

## 2022-03-28 MED ORDER — LIDOCAINE 2% (20 MG/ML) 5 ML SYRINGE
INTRAMUSCULAR | Status: AC
  Filled 2022-03-28: qty 5

## 2022-03-28 MED ORDER — SUCCINYLCHOLINE CHLORIDE 200 MG/10ML IV SOSY
PREFILLED_SYRINGE | INTRAVENOUS | Status: AC
  Filled 2022-03-28: qty 10

## 2022-03-28 MED ORDER — MIDAZOLAM HCL 2 MG/2ML IJ SOLN
INTRAMUSCULAR | Status: AC
  Filled 2022-03-28: qty 2

## 2022-03-28 MED ORDER — AMISULPRIDE (ANTIEMETIC) 5 MG/2ML IV SOLN
INTRAVENOUS | Status: AC
  Filled 2022-03-28: qty 4

## 2022-03-28 SURGICAL SUPPLY — 55 items
ADH SKN CLS APL DERMABOND .7 (GAUZE/BANDAGES/DRESSINGS) ×2
APL SKNCLS STERI-STRIP NONHPOA (GAUZE/BANDAGES/DRESSINGS)
BAG DECANTER FOR FLEXI CONT (MISCELLANEOUS) ×2 IMPLANT
BENZOIN TINCTURE PRP APPL 2/3 (GAUZE/BANDAGES/DRESSINGS) IMPLANT
BIOPATCH RED 1 DISK 7.0 (GAUZE/BANDAGES/DRESSINGS) ×2 IMPLANT
BLADE SURG 15 STRL LF DISP TIS (BLADE) ×1 IMPLANT
BLADE SURG 15 STRL SS (BLADE) ×2
BNDG ELASTIC 6X5.8 VLCR STR LF (GAUZE/BANDAGES/DRESSINGS) IMPLANT
CANISTER SUCT 1200ML W/VALVE (MISCELLANEOUS) ×1 IMPLANT
COVER BACK TABLE 60X90IN (DRAPES) ×2 IMPLANT
COVER MAYO STAND STRL (DRAPES) ×2 IMPLANT
DERMABOND ADVANCED (GAUZE/BANDAGES/DRESSINGS) ×2
DERMABOND ADVANCED .7 DNX12 (GAUZE/BANDAGES/DRESSINGS) IMPLANT
DRAIN CHANNEL 15F RND FF W/TCR (WOUND CARE) ×2 IMPLANT
DRAIN CHANNEL 7F 3/4 FLAT (WOUND CARE) IMPLANT
DRAPE LAPAROSCOPIC ABDOMINAL (DRAPES) ×2 IMPLANT
DRSG OPSITE POSTOP 3X4 (GAUZE/BANDAGES/DRESSINGS) ×2 IMPLANT
DRSG PAD ABDOMINAL 8X10 ST (GAUZE/BANDAGES/DRESSINGS) ×4 IMPLANT
DRSG TEGADERM 2-3/8X2-3/4 SM (GAUZE/BANDAGES/DRESSINGS) ×2 IMPLANT
DRSG TEGADERM 4X4.75 (GAUZE/BANDAGES/DRESSINGS) ×1 IMPLANT
ELECT COATED BLADE 2.86 ST (ELECTRODE) ×2 IMPLANT
ELECT REM PT RETURN 9FT ADLT (ELECTROSURGICAL) ×2
ELECTRODE REM PT RTRN 9FT ADLT (ELECTROSURGICAL) ×1 IMPLANT
EVACUATOR SILICONE 100CC (DRAIN) ×2 IMPLANT
GAUZE SPONGE 4X4 12PLY STRL (GAUZE/BANDAGES/DRESSINGS) ×4 IMPLANT
GLOVE BIO SURGEON STRL SZ 6.5 (GLOVE) ×2 IMPLANT
GOWN STRL REUS W/ TWL LRG LVL3 (GOWN DISPOSABLE) ×1 IMPLANT
GOWN STRL REUS W/TWL LRG LVL3 (GOWN DISPOSABLE) ×8
IV LACTATED RINGERS 1000ML (IV SOLUTION) ×1 IMPLANT
LINER CANISTER 1000CC FLEX (MISCELLANEOUS) ×1 IMPLANT
NDL HYPO 27GX1-1/4 (NEEDLE) IMPLANT
NDL SAFETY ECLIPSE 18X1.5 (NEEDLE) IMPLANT
NDL SPNL 18GX3.5 QUINCKE PK (NEEDLE) ×1 IMPLANT
NEEDLE HYPO 18GX1.5 SHARP (NEEDLE)
NEEDLE HYPO 27GX1-1/4 (NEEDLE) IMPLANT
NEEDLE SPNL 18GX3.5 QUINCKE PK (NEEDLE) ×2 IMPLANT
NS IRRIG 1000ML POUR BTL (IV SOLUTION) ×1 IMPLANT
PACK BASIN DAY SURGERY FS (CUSTOM PROCEDURE TRAY) ×2 IMPLANT
PENCIL SMOKE EVACUATOR (MISCELLANEOUS) ×2 IMPLANT
PIN SAFETY STERILE (MISCELLANEOUS) ×1 IMPLANT
SPECIMEN JAR MEDIUM (MISCELLANEOUS) ×2 IMPLANT
SPIKE FLUID TRANSFER (MISCELLANEOUS) ×4 IMPLANT
SPONGE T-LAP 18X18 ~~LOC~~+RFID (SPONGE) ×3 IMPLANT
STRIP CLOSURE SKIN 1/2X4 (GAUZE/BANDAGES/DRESSINGS) ×8 IMPLANT
STRIP SUTURE WOUND CLOSURE 1/2 (MISCELLANEOUS) IMPLANT
SUT MNCRL AB 4-0 PS2 18 (SUTURE) ×4 IMPLANT
SUT MON AB 5-0 PS2 18 (SUTURE) ×4 IMPLANT
SUT SILK 3 0 PS 1 (SUTURE) ×3 IMPLANT
SYR 20ML LL LF (SYRINGE) IMPLANT
SYR 50ML LL SCALE MARK (SYRINGE) ×4 IMPLANT
SYR CONTROL 10ML LL (SYRINGE) ×4 IMPLANT
TUBE CONNECTING 20X1/4 (TUBING) ×1 IMPLANT
TUBING SET GRADUATE ASPIR 12FT (MISCELLANEOUS) ×1 IMPLANT
UNDERPAD 30X36 HEAVY ABSORB (UNDERPADS AND DIAPERS) ×2 IMPLANT
YANKAUER SUCT BULB TIP NO VENT (SUCTIONS) ×1 IMPLANT

## 2022-03-28 NOTE — Interval H&P Note (Signed)
History and Physical Interval Note:  03/28/2022 9:50 AM  Kristina [name]  has presented today for surgery, with the diagnosis of Excess skin of bilateral axilliary tissue.  The various methods of treatment have been discussed with the patient and family. After consideration of risks, benefits and other options for treatment, the patient has consented to  Procedure(s) with comments: Excision of bilateral axillary tissue (Bilateral) - 2 hours as a surgical intervention.  The patient's history has been reviewed, patient examined, no change in status, stable for surgery.  I have reviewed the patient's chart and labs.  Questions were answered to the patient's satisfaction.     [name]

## 2022-03-28 NOTE — Discharge Instructions (Addendum)
INSTRUCTIONS FOR AFTER SURGERY   You will likely have some questions about what to expect following your operation.  The following information will help you and your family understand what to expect when you are discharged from the hospital.  Following these guidelines will help ensure a smooth recovery and reduce risks of complications.  Postoperative instructions include information on: diet, wound care, medications and physical activity.  AFTER SURGERY Expect to go home after the procedure.  In some cases, you may need to spend one night in the hospital for observation.  DIET Breast surgery does not require a specific diet.  However, the healthier you eat the better your body can start healing. It is important to increasing your protein intake.  This means limiting the foods with sugar and carbohydrates.  Focus on vegetables and some meat.  If you have any liposuction during your procedure be sure to drink water.  If your urine is bright yellow, then it is concentrated, and you need to drink more water.  As a general rule after surgery, you should have 8 ounces of water every hour while awake.  If you find you are persistently nauseated or unable to take in liquids let us know.  NO TOBACCO USE or EXPOSURE.  This will slow your healing process and increase the risk of a wound.  WOUND CARE Leave the binder on for 3 days . Use fragrance free soap.   After 3 days you can remove the binder to shower. Once dry apply ACE wrap, binder or sports bra.  Use a mild soap like Dial, Dove and [place]. You may have Topifoam or Lipofoam on.  It is soft and spongy and helps keep you from getting creases if you have liposuction.  This can be removed before the shower and then replaced.  If you need more it is available on [employer] (Lipofoam). If you have steri-strips / tape directly attached to your skin leave them in place. It is OK to get these wet.   No baths, pools or hot tubs for four weeks. We close your incision  to leave the smallest and best-looking scar. No ointment or creams on your incisions until given the go ahead.  Especially not Neosporin (Too many skin reactions with this one).  A few weeks after surgery you can use Mederma and start massaging the scar. We ask you to wear your binder or sports bra for the first 6 weeks around the clock, including while sleeping. This provides added comfort and helps reduce the fluid accumulation at the surgery site.  ACTIVITY No heavy lifting until cleared by the doctor.  This usually means no more than a half-gallon of milk.  It is OK to walk and climb stairs. In fact, moving your legs is very important to decrease your risk of a blood clot.  It will also help keep you from getting deconditioned.  Every 1 to 2 hours get up and walk for 5 minutes. This will help with a quicker recovery back to normal.  Let pain be your guide so you don't do too much.  This is not the time for spring cleaning and don't plan on taking care of anyone else.  This time is for you to recover,  You will be more comfortable if you sleep and rest with your head elevated either with a few pillows under you or in a recliner.  No stomach sleeping for a three months.  WORK Everyone returns to work at different times. As  a rough guide, most people take at least 1 - 2 weeks off prior to returning to work. If you need documentation for your job, bring the forms to your postoperative follow up visit.  DRIVING Arrange for someone to bring you home from the hospital.  You may be able to drive a few days after surgery but not while taking any narcotics or valium.  BOWEL MOVEMENTS Constipation can occur after anesthesia and while taking pain medication.  It is important to stay ahead for your comfort.  We recommend taking Milk of Magnesia (2 tablespoons; twice a day) while taking the pain pills.  MEDICATIONS You may be prescribed should start after surgery At your preoperative visit for you history and  physical you may have been given the following medications: An antibiotic: Start this medication when you get home and take according to the instructions on the bottle. Zofran 4 mg:  This is to treat nausea and vomiting.  You can take this every 6 hours as needed and only if needed. Valium 2 mg: This is for muscle tightness if you have an implant or expander. This will help relax your muscle which also helps with pain control.  This can be taken every 12 hours as needed. Don't drive after taking this medication. Norco (hydrocodone/acetaminophen) 5/325 mg:  This is only to be used after you have taken the motrin or the tylenol. Every 8 hours as needed.   Over the counter Medication to take: Ibuprofen (Motrin) 600 mg:  Take this every 6 hours.  If you have additional pain then take 500 mg of the tylenol every 8 hours.  Only take the Norco after you have tried these two. Miralax or stool softener of choice: Take this according to the bottle if you take the [place] Call your surgeon's office if any of the following occur: Fever 101 degrees F or greater Excessive bleeding or fluid from the incision site. Pain that increases over time without aid from the medications Redness, warmth, or pus draining from incision sites Persistent nausea or inability to take in liquids Severe misshapen area that underwent the operation.   Post Anesthesia Home Care Instructions  Activity: Get plenty of rest for the remainder of the day. A responsible individual must stay with you for 24 hours following the procedure.  For the next 24 hours, DO NOT: -Drive a car -Paediatric nurse -Drink alcoholic beverages -Take any medication unless instructed by your physician -Make any legal decisions or sign important papers.  Meals: Start with liquid foods such as gelatin or soup. Progress to regular foods as tolerated. Avoid greasy, spicy, heavy foods. If nausea and/or vomiting occur, drink only clear  liquids until the nausea and/or vomiting subsides. Call your physician if vomiting continues.  Special Instructions/Symptoms: Your throat may feel dry or sore from the anesthesia or the breathing tube placed in your throat during surgery. If this causes discomfort, gargle with warm salt water. The discomfort should disappear within 24 hours.  If you had a scopolamine patch placed behind your ear for the management of post- operative nausea and/or vomiting:  1. The medication in the patch is effective for 72 hours, after which it should be removed.  Wrap patch in a tissue and discard in the trash. Wash hands thoroughly with soap and water. 2. You may remove the patch earlier than 72 hours if you experience unpleasant side effects which may include dry mouth, dizziness or visual disturbances. 3. Avoid touching the  patch. Wash your hands with soap and water after contact with the patch.      JP Drain [employer] this sheet to all of your post-operative appointments while you have your drains. Please measure your drains by CC's or ML's. Make sure you drain and measure your JP Drains 2 or 3 times per day. At the end of each day, add up totals for the left side and add up totals for the right side.    ( 9 am )     ( 3 pm )        ( 9 pm )                Date L  R  L  R  L  R  Total L/R                                                                                                                                                                                         About my Jackson-Pratt Bulb Drain  What is a Jackson-Pratt bulb? A Jackson-Pratt is a soft, round device used to collect drainage. It is connected to a long, thin drainage catheter, which is held in place by one or two small stiches near your surgical incision site. When the bulb is squeezed, it forms a vacuum, forcing the drainage to empty into the bulb.  Emptying the Jackson-Pratt bulb- To empty the bulb: 1.  Release the plug on the top of the bulb. 2. Pour the bulb's contents into a measuring container which your nurse will provide. 3. Record the time emptied and amount of drainage. Empty the drain(s) as often as your     doctor or nurse recommends.  Date                  Time                    Amount (Drain 1)                 Amount (Drain 2)  _____________________________________________________________________  _____________________________________________________________________  _____________________________________________________________________  _____________________________________________________________________  _____________________________________________________________________  _____________________________________________________________________  _____________________________________________________________________  _____________________________________________________________________  Squeezing the Jackson-Pratt Bulb- To squeeze the bulb: 1. Make sure the plug at the top of the bulb is open. 2. Squeeze the bulb tightly in your fist. You will hear air squeezing from the bulb. 3. Replace the plug while the bulb is squeezed. 4. Use a safety pin to attach the bulb to your clothing. This will keep the catheter from     pulling at the bulb insertion site.  When to call your doctor- Call  your doctor if: Drain site becomes red, swollen or hot. You have a fever greater than 101 degrees F. There is oozing at the drain site. Drain falls out (apply a guaze bandage over the drain hole and secure it with tape). Drainage increases daily not related to activity patterns. (You will usually have more drainage when you are active than when you are resting.) Drainage has a bad odor.       No tylenol until after 4pm if needed today.

## 2022-03-28 NOTE — Transfer of Care (Signed)
Immediate Anesthesia Transfer of Care Note  Patient: Kristina [name]  Procedure(s) Performed: Excision of bilateral axillary tissue (Bilateral: Arm Upper)  Patient Location: PACU  Anesthesia Type:General  Level of Consciousness: awake, alert , oriented and patient cooperative  Airway & Oxygen Therapy: Patient Spontanous Breathing and Patient connected to face mask oxygen  Post-op Assessment: Report given to RN and Post -op Vital signs reviewed and stable  Post vital signs: Reviewed and stable  Last Vitals:  Vitals Value Taken Time  BP    Temp    Pulse 71 03/28/22 1343  Resp    SpO2 99 % 03/28/22 1343  Vitals shown include unvalidated device data.  Last Pain:  Vitals:   03/28/22 0846  TempSrc: Oral  PainSc: 0-No pain         Complications: No notable events documented.

## 2022-03-28 NOTE — Op Note (Addendum)
Op note:    DATE OF PROCEDURE: 03/28/2022  LOCATION: [place]  SURGEON: [facility] Gerod Caligiuri, DO  ASSISTANT: Donnamarie Rossetti, PA  PREOPERATIVE DIAGNOSIS 1. Bilateral accessory breast tissue.  POSTOPERATIVE DIAGNOSIS Same as preoperative diagnosis  PROCEDURES 1. Bilateral excision of accessory breast tissue.  Right reduction 418 g, Left reduction 694 g  COMPLICATIONS: None.  DRAINS: bilateral  INDICATIONS FOR PROCEDURE Kristina [name] is a 51 y.o. year-old female born on Jul 16, 1971,with a history of skin break down, rashes and irritation of the axillary area/lateral breast   MRN: [id number]  CONSENT Informed consent was obtained directly from the patient. The risks, benefits and alternatives were fully discussed. Specific risks including but not limited to bleeding, infection, hematoma, seroma, scarring, pain, nipple necrosis, asymmetry, poor cosmetic results, and need for further surgery were discussed. The patient had ample opportunity to have her questions answered to her satisfaction.  DESCRIPTION OF PROCEDURE  Patient was brought into the operating room and placed in a supine position.  SCDs were placed and appropriate padding was performed.  Antibiotics were given. The patient underwent general anesthesia and the chest was prepped and draped in a sterile fashion.  A timeout was performed and all information was confirmed to be correct. Tumescent was placed in the bilateral axillary area.  Right side: Preoperative markings were confirmed.  Incision lines were injected with local with epinephrine.  After waiting for vasoconstriction, The liposuction cannula was inserted through a #15 blade incision.   The marked line was then incised. The bovie was used to undermine a flap toward the axilla keeping out of the axillary triangle and superficial to the nodes.  Hemostasis was achieved with electrocautery.  The amount of skin and soft tissue to be excised  was marked and removed with the #10 blade.  A drain was placed and secured to the skin with a 3-0 Silk stitch. The deep tissues were approximated with 3-0 PDS and 3-0 Monocryl sutures and the skin was closed with deep dermal and subcuticular 4-0 Monocryl sutures.    Left side: Preoperative markings were confirmed.  Incision lines were injected with local with epinephrine.  After waiting for vasoconstriction, The liposuction cannula was inserted through a #15 blade incision.   The marked line was then incised. The bovie was used to undermine a flap toward the axilla keeping out of the axillary triangle and superficial to the nodes.  Hemostasis was achieved with electrocautery.  The amount of skin and soft tissue to be excised was marked and removed with the #10 blade.  A drain was placed and secured to the skin with a 3-0 Silk stitch. The deep tissues were approximated with 3-0 PDS and 3-0 Monocryl sutures and the skin was closed with deep dermal and subcuticular 4-0 Monocryl sutures.    Dermabond was applied.  A breast binder and ABDs were placed.  The nipple and skin flaps had good capillary refill at the end of the procedure.  The patient tolerated the procedure well. The patient was allowed to wake from anesthesia and taken to the recovery room in satisfactory condition.  The advanced practice practitioner (APP) assisted throughout the case.  The APP was essential in retraction and counter traction when needed to make the case progress smoothly.  This retraction and assistance made it possible to see the tissue plans for the procedure.  The assistance was needed for blood control, tissue re-approximation and assisted with closure of the incision site.

## 2022-03-28 NOTE — Anesthesia Procedure Notes (Signed)
Procedure Name: LMA Insertion Date/Time: 03/28/2022 10:32 AM  Performed by: Willa Frater, CRNAPre-anesthesia Checklist: Patient identified, Emergency Drugs available, Suction available and Patient being monitored Patient Re-evaluated:Patient Re-evaluated prior to induction Oxygen Delivery Method: Circle system utilized Preoxygenation: Pre-oxygenation with 100% oxygen Induction Type: IV induction Ventilation: Mask ventilation without difficulty LMA: LMA inserted LMA Size: 4.0 Number of attempts: 1 Airway Equipment and Method: Bite block Placement Confirmation: positive ETCO2 Tube secured with: Tape Dental Injury: Teeth and Oropharynx as per pre-operative assessment

## 2022-03-28 NOTE — Anesthesia Preprocedure Evaluation (Addendum)
Anesthesia Evaluation  Patient identified by MRN, date of birth, ID band Patient awake    Reviewed: Allergy & Precautions, NPO status , Patient's Chart, lab work & pertinent test results  History of Anesthesia Complications (+) PONV and history of anesthetic complications  Airway Mallampati: II  TM Distance: >3 FB Neck ROM: Full    Dental   Crowns:   Pulmonary asthma , sleep apnea and Continuous Positive Airway Pressure Ventilation ,    Pulmonary exam normal breath sounds clear to auscultation       Cardiovascular hypertension, + angina + CAD  Normal cardiovascular exam Rhythm:Regular Rate:Normal  12/20/21: ECHO: 1. Left ventricular ejection fraction, by estimation, is 55 to 60%. The left ventricle has normal function. The left ventricle has no regional wall motion abnormalities. Left ventricular diastolic parameters are consistent with Grade I diastolic  dysfunction (impaired relaxation). 2. Right ventricular systolic function is normal. The right ventricular size is normal. There is normal pulmonary artery systolic pressure. The estimated right ventricular systolic pressure is 67.6 mmHg. 3. The mitral valve is grossly normal. Trivial mitral valve regurgitation. 4. The aortic valve is tricuspid. Aortic valve regurgitation is trivial. No aortic stenosis is present. Aortic valve mean gradient measures 3.0 mmHg. 5. Aortic dilatation noted. There is moderate dilatation of the aortic root, measuring 46 mm. There is mild dilatation of the ascending aorta, measuring 37 mm. 6. The inferior vena cava is normal in size with greater than 50% respiratory variability, suggesting right atrial pressure of 3 mmHg.   Neuro/Psych  Headaches, PSYCHIATRIC DISORDERS Anxiety Depression    GI/Hepatic GERD  Medicated,  Endo/Other  diabetes, Type 2Hypothyroidism hyperlipidemia  Renal/GU      Musculoskeletal  (+) Arthritis , Osteoarthritis,     Abdominal   Peds  Hematology  (+) Blood dyscrasia, anemia ,   Anesthesia Other Findings Vocal cord dysfunction  Reproductive/Obstetrics                            Anesthesia Physical Anesthesia Plan  ASA: 3  Anesthesia Plan: General   Post-op Pain Management: Tylenol PO (pre-op)*   Induction: Intravenous  PONV Risk Score and Plan: 4 or greater and Scopolamine patch - Pre-op, Treatment may vary due to age or medical condition, Ondansetron, Dexamethasone and Midazolam  Airway Management Planned: Oral ETT  Additional Equipment: None  Intra-op Plan:   Post-operative Plan: Extubation in OR  Informed Consent: I have reviewed the patients History and Physical, chart, labs and discussed the procedure including the risks, benefits and alternatives for the proposed anesthesia with the patient or authorized representative who has indicated his/her understanding and acceptance.     Dental advisory given  Plan Discussed with: CRNA, Anesthesiologist and Surgeon  Anesthesia Plan Comments:         Anesthesia Quick Evaluation

## 2022-03-29 ENCOUNTER — Encounter (HOSPITAL_BASED_OUTPATIENT_CLINIC_OR_DEPARTMENT_OTHER): Payer: Self-pay | Admitting: Plastic Surgery

## 2022-04-01 LAB — SURGICAL PATHOLOGY

## 2022-04-02 NOTE — Anesthesia Postprocedure Evaluation (Signed)
Anesthesia Post Note  Patient: Kristina [name]  Procedure(s) Performed: Excision of bilateral axillary tissue (Bilateral: Arm Upper)     Patient location during evaluation: PACU Anesthesia Type: General Level of consciousness: sedated Pain management: pain level controlled Vital Signs Assessment: post-procedure vital signs reviewed and stable Respiratory status: spontaneous breathing and respiratory function stable Cardiovascular status: stable Postop Assessment: no apparent nausea or vomiting Anesthetic complications: no   No notable events documented.  Last Vitals:  Vitals:   03/28/22 1430 03/28/22 1522  BP: (!) 142/83 (!) 155/81  Pulse: 77 70  Resp: 11   Temp:  36.8 C  SpO2: 97% 95%    Last Pain:  Vitals:   03/29/22 1045  TempSrc:   PainSc: 0-No pain                 Merlinda Frederick

## 2022-04-04 ENCOUNTER — Telehealth: Payer: Self-pay

## 2022-04-04 ENCOUNTER — Ambulatory Visit (INDEPENDENT_AMBULATORY_CARE_PROVIDER_SITE_OTHER): Payer: Medicare Other | Admitting: Physician Assistant

## 2022-04-04 ENCOUNTER — Encounter: Payer: Self-pay | Admitting: Physician Assistant

## 2022-04-04 ENCOUNTER — Telehealth: Payer: Self-pay | Admitting: Physician Assistant

## 2022-04-04 DIAGNOSIS — E881 Lipodystrophy, not elsewhere classified: Secondary | ICD-10-CM

## 2022-04-04 MED ORDER — OXYCODONE HCL 5 MG/5ML PO SOLN: 5.0000 mg | Freq: Four times a day (QID) | ORAL | 0 refills | Status: DC | PRN

## 2022-04-04 NOTE — Telephone Encounter (Signed)
Pt was inquiring about a refill of her pain medication.

## 2022-04-04 NOTE — Telephone Encounter (Signed)
Pt called to check on status of medication refill. Advised her that provider Merry Proud, PA-C was still in [place] but would be given the message. She states that it has to be special ordered bc it is liquid and will take time to be filled and she is concerned about running out over the week-end. Assured pt message had been sent to provider and she verbalized understanding.

## 2022-04-04 NOTE — Telephone Encounter (Signed)
I spoke with Ms Gladwell and sent in prescription of pain medication.

## 2022-04-04 NOTE — Telephone Encounter (Signed)
Pt was seen in office today. She stated she was supposed to get a refill of her oxycodone (liquid). I informed her that Merry Proud was in surgery and I would send him a message and that it may be end-of-day that she gets the refill. Pt conveyed understanding.

## 2022-04-04 NOTE — Progress Notes (Signed)
Kristina [name] is a 30 YOF who is sp bilateral excision of axillary skin, soft tissue and fat on 03/28/2022 by Dr. Marla Roe. The patient has done well postoperatively. She denies any issues with her incision. No fever, redness, warmth, or discharge. She would like her drains removed and would like to go back to work. Her drain have put of 30 cc, 20 cc, 2cc over the last 3 days respectively.  On exam her incision are CDI,with no surrounding redness warmth or discharge. No noted fluid collections on palpation. Drains with scant serosanguinous output.   I discussed removal with Dr. Marla Roe who agreed that it would be ok to remove at this point. They were removed in the office. Dressing applied. The patient will follow back up with [place] on her regularly scheduled visit next Tuesday. She will call with any questions or concerns she may have.  She verbalized understanding and agreement to today's plan had no further questions or concerns.

## 2022-04-05 MED ORDER — OXYCODONE HCL 5 MG/5ML PO SOLN: 5.0000 mg | Freq: Four times a day (QID) | ORAL | 0 refills | Status: DC | PRN

## 2022-04-05 NOTE — Addendum Note (Signed)
Addended byPenni Bombard, Jep Dyas on: 04/05/2022 08:09 PM   Modules accepted: Orders

## 2022-04-09 ENCOUNTER — Ambulatory Visit (INDEPENDENT_AMBULATORY_CARE_PROVIDER_SITE_OTHER): Payer: Medicare Other | Admitting: Plastic Surgery

## 2022-04-09 ENCOUNTER — Encounter: Payer: Self-pay | Admitting: Plastic Surgery

## 2022-04-09 DIAGNOSIS — N6459 Other signs and symptoms in breast: Secondary | ICD-10-CM

## 2022-04-09 NOTE — Progress Notes (Signed)
   Subjective:    Patient ID: Kristina [name], female    DOB: 05/20/1971, 51 y.o.   MRN: [id number]  The patient is a 51 year old female here for follow-up after undergoing excision of the axillary accessory breast tissue.  The incisions are intact.  The drains are out.  The area is healing nicely.  There is no sign of hematoma or seroma and no sign of infection.      Review of Systems  Constitutional: Negative.   Eyes: Negative.   Respiratory: Negative.    Cardiovascular: Negative.   Endocrine: Negative.   Genitourinary: Negative.        Objective:   Physical Exam Constitutional:      Appearance: Normal appearance.  Cardiovascular:     Rate and Rhythm: Normal rate.  Skin:    Capillary Refill: Capillary refill takes less than 2 seconds.  Neurological:     Mental Status: She is alert and oriented to person, place, and time.  Psychiatric:        Mood and Affect: Mood normal.        Behavior: Behavior normal.         Assessment & Plan:     ICD-10-CM   1. Abnormal breast tissue  N64.59     Accessory breast tissue  Continue with sports bra and compression as able.  Can do full range of motion but no heavy lifting.  We will remove the stitches at the knots on her next visit in the next 1 to 2 weeks.

## 2022-04-15 ENCOUNTER — Encounter: Payer: Self-pay | Admitting: Hematology and Oncology

## 2022-04-16 ENCOUNTER — Ambulatory Visit (INDEPENDENT_AMBULATORY_CARE_PROVIDER_SITE_OTHER): Payer: Medicare Other | Admitting: Student

## 2022-04-16 ENCOUNTER — Encounter: Payer: Self-pay | Admitting: Plastic Surgery

## 2022-04-16 DIAGNOSIS — Z9889 Other specified postprocedural states: Secondary | ICD-10-CM

## 2022-04-16 DIAGNOSIS — E881 Lipodystrophy, not elsewhere classified: Secondary | ICD-10-CM

## 2022-04-16 NOTE — Progress Notes (Addendum)
Patient is a 51 year old female who underwent bilateral excision of axillary skin and soft tissue and fat on 03/28/2022 with Dr. Marla Roe.  Patient presents to the clinic for postoperative follow-up.  Patient was last seen in the clinic on 04/09/2022.  At this visit, patient was doing well.  Incision was healing well and there was no signs of hematoma or seroma.  On exam, patient sitting upright in no acute distress.  The incision to the right axilla is intact.  There is a small area of firmness noted to the posterior aspect of the incision.  This appears to be consistent with some scarring.  There is no surrounding erythema or drainage.  There were several suture knots noted to the right incision, these were cut and removed.  Patient tolerated well.  The incision to the left axilla is intact.  There is no surrounding erythema or drainage.  There does appear to be one area of tissue that is scarred down.  There were several suture knots noted, these were cut and removed.  Patient tolerated well.  There is some residual Dermabond noted to incisions bilaterally.  Discussed with the patient to continue compressive bra.  I discussed with the patient that she may put a small amount of vaseline over the incision site to help remove the residual Dermabond.  I discussed with the patient that she can use silicone sheets, starting next week.  Discussed with the patient to avoid strenuous activities or submerging her incisions at this time.  Instructed patient to call if she has any questions or concerns.  Patient to follow-up in 2 weeks.

## 2022-04-19 ENCOUNTER — Encounter: Payer: Medicare Other | Admitting: Student

## 2022-04-19 ENCOUNTER — Encounter (HOSPITAL_BASED_OUTPATIENT_CLINIC_OR_DEPARTMENT_OTHER): Payer: Self-pay | Admitting: Plastic Surgery

## 2022-04-24 ENCOUNTER — Other Ambulatory Visit: Payer: Self-pay | Admitting: *Deleted

## 2022-04-24 NOTE — Patient Outreach (Signed)
  Care Coordination   04/24/2022 Name: Kristina [name] MRN: [id number] DOB: [birth year]   Care Coordination Outreach Attempts:  An unsuccessful telephone outreach was attempted today to offer the patient information about available care coordination services as a benefit of their health plan.   Follow Up Plan:  Additional outreach attempts will be made to offer the patient care coordination information and services.   Encounter Outcome:  Pt. Request to Call Back  Care Coordination Interventions Activated:  No   Care Coordination Interventions:  No, not indicated    Emelia Loron RN, BSN [place] [phone] [email or web address]

## 2022-04-29 DIAGNOSIS — I77810 Thoracic aortic ectasia: Secondary | ICD-10-CM | POA: Diagnosis not present

## 2022-04-29 DIAGNOSIS — E1169 Type 2 diabetes mellitus with other specified complication: Secondary | ICD-10-CM | POA: Diagnosis not present

## 2022-04-29 DIAGNOSIS — E782 Mixed hyperlipidemia: Secondary | ICD-10-CM | POA: Diagnosis not present

## 2022-04-29 DIAGNOSIS — D508 Other iron deficiency anemias: Secondary | ICD-10-CM | POA: Diagnosis not present

## 2022-04-29 DIAGNOSIS — E785 Hyperlipidemia, unspecified: Secondary | ICD-10-CM | POA: Diagnosis not present

## 2022-04-29 DIAGNOSIS — Z0000 Encounter for general adult medical examination without abnormal findings: Secondary | ICD-10-CM | POA: Diagnosis not present

## 2022-04-29 DIAGNOSIS — E538 Deficiency of other specified B group vitamins: Secondary | ICD-10-CM | POA: Diagnosis not present

## 2022-04-29 DIAGNOSIS — E039 Hypothyroidism, unspecified: Secondary | ICD-10-CM | POA: Diagnosis not present

## 2022-04-30 ENCOUNTER — Telehealth: Payer: Self-pay

## 2022-04-30 ENCOUNTER — Ambulatory Visit (INDEPENDENT_AMBULATORY_CARE_PROVIDER_SITE_OTHER): Payer: Medicare Other | Admitting: Student

## 2022-04-30 DIAGNOSIS — E881 Lipodystrophy, not elsewhere classified: Secondary | ICD-10-CM

## 2022-04-30 NOTE — Telephone Encounter (Signed)
L/M for patient letting her know that Dr. Marla Roe was coming to see her and I noticed that she had left. Per Dr. Marla Roe, ok for patient to come back and be seen by her if still wanting.

## 2022-04-30 NOTE — Progress Notes (Signed)
Patient is a 51 year old female who underwent bilateral excision of axillary skin and soft tissue and fat on 03/28/2022 with Dr. Marla Roe.  Patient presents to the clinic today for for postoperative follow-up.  Patient was last seen in the clinic on 04/16/2022.  On exam, the incision was intact bilaterally.  Several suture knots were noted, and these were removed.  Discussed with the patient to start using silicone sheets in a week or so after the visit.  Today, patient reports she is doing okay.  She presents to the clinic today with her daughter at bedside.  Patient states that she has been tired for the past week.  She denies any fevers or chills.  She denies any other symptoms associated with it.  Patient overall says that she feels her incisions are healing well.  She denies any drainage from her incisions.  Patient states that she is interested in silicone sheets for her scars.  On exam, patient is sitting upright in no acute distress.  The incision to her right axilla is intact, and healing well.  There is no surrounding erythema or drainage to the incision.  There is a small area of firmness to the posterior aspect, that appears consistent with scarring.  The left incision has a small superficial 0.25 cm wound to the middle of the incision.  There is a little bit of surrounding erythema.  There is no drainage noted, and no drainage when the area is palpated.  There is also a small area of firmness to the posterior aspect of the incision, that appears consistent with scarring.  Pictures were obtained of the patient and placed in the chart with the patient's or guardian's permission.   I discussed with the patient that she should continue Vaseline to the small wound to her left incision.  I discussed with her that she should avoid submerging the area until the wound is fully healed.  I discussed with the patient to wait another week or so before restarting boxing.  I discussed with the patient to  continue to monitor the area of redness.  I discussed with her that if the redness gets larger, if it becomes swollen, or if it begins to drain or if she develops fevers or chills to contact us.  Patient expressed understanding.  I discussed with the patient that we now have Silagen gel and sheets available in the office for her scars.  I discussed with the patient that she can use either the gel or the sheets, which ever she preferred.  Patient then became upset that I suggested that she use the gel if she wanted, and then she left the room and left the clinic without making a follow-up appointment.  I told the patient's daughter the patient can follow up with [place] in two weeks to have her incision evaluated if she would like.

## 2022-05-02 ENCOUNTER — Encounter: Payer: Self-pay | Admitting: Hematology and Oncology

## 2022-05-15 ENCOUNTER — Encounter: Payer: Medicare Other | Attending: General Surgery | Admitting: Skilled Nursing Facility1

## 2022-05-15 ENCOUNTER — Encounter: Payer: Self-pay | Admitting: Skilled Nursing Facility1

## 2022-05-15 DIAGNOSIS — E119 Type 2 diabetes mellitus without complications: Secondary | ICD-10-CM | POA: Diagnosis not present

## 2022-05-15 NOTE — Progress Notes (Signed)
Follow-up visit:  Post-Operative Surgery   Surgery date: 06/26/2021 Surgery type: sleeve to RYGB Start weight at [facility]: 214.7 Weight today: 197.8 pounds   Body Composition Scale 07/10/2021 08/08/2021 10/31/2021 05/15/2022  Current Body Weight 213.6 205.1 197.8 182.1  Total Body Fat % 42.2 41.2 40.3 37.7  Visceral Fat '14 13 12 11  '$ Fat-Free Mass % 57.7 58.7 59.6 62.2   Total Body Water % 43.3 43.8 44.3 45.6  Muscle-Mass lbs 30.3 30.2 30 29.8  BMI 37.4 36 34.6 31.8  Body Fat Displacement             Torso  lbs 55.8 52.3 49.3 42.4         Left Leg  lbs 11.1 10.4 9.8 8.4         Right Leg  lbs 11.1 10.4 9.8 834         Left Arm  lbs 5.5 5.2 4.9 4.2         Right Arm   lbs 5.5 5.2 4.9 4.2   Clinical  Medical hx: GERD Medications: monjouro, thyroid Labs: B 12 1710, A1C 5.3 Notable signs/symptoms:  Any previous deficiencies? anemia Fluids: 70+ ounces   Pt state she feels she does not have the energy she should have.  Pt states she thinks she should work on some muscle toning. Pt states she thinks she is not getting enough protein or her supplements.  Pt states her grandkids are being kept from her so that is stressful for her.  Pt state she does not sleep well stating she cannot seem to unwind stating her doctor prescribed half a Zoloft for that.  Pt states she has cut back 12 packets of splenda a day to 6 a week.    24 hr recall: First meal: apple + cheese cube + pretzel  Second meal: grilled cheese on diet bread or salad + lunch meat and egg or pack of crackers Cranberries + almonds  + cashews Third meal: salad + egg and steak or frozen meal  Fluid intake: water, flavored water, unsweet tea, 1 cup of coffee + creamer  Medications: See List Supplementation: multi and calcium B 12, vitamin D   Using straws: no Drinking while eating: no Having you been chewing well: yes Chewing/swallowing difficulties: no Changes in vision: no Changes to mood/headaches: no Hair  loss/Cahnges to skin/Changes to nails: no Any difficulty focusing or concentrating: no Sweating: no Dizziness/Lightheaded: no Palpitations: no  Carbonated beverages: no N/V/D/C/GAS: no Abdominal Pain: no Dumping syndrome: no  Recent physical activity:  walking and riding her bike once to twice a week; will do water aerobics and boxing   Education Topics: Why you need complex carbohydrates: Whole grains and other complex carbohydrates are required to have a healthy diet. Whole grains provide fiber which can help with blood glucose levels and help keep you satiated. Fruits and starchy vegetables provide essential vitamins and minerals required for immune function, eyesight support, brain support, bone density, wound healing and many other functions within the body. According to the current evidenced based 2020-2025 Dietary Guidelines for Americans, complex carbohydrates are part of a healthy eating pattern which is associated with a decreased risk for type 2 diabetes, cancers, and cardiovascular disease.  Why physical activity is needed:  Physical activity has various benefits for the body such as reducing stress, helping control blood glucose levels, strengthening the heart, reducing incidence of cognitive impairment, and has a neuroprotective effect on memory function. Research reports that physical activity (whether it  be aerobic, resistance training, or high intensity interval training) help control the level of reactive oxygen species which can lead to oxidative stress on the brain therefor possibly impairing memory, learning abilities, and other cognitive functions. The CDC recognizes that physical activity can help prevent premature death, may prevent the development of type 2 diabetes, different cancers, and heart disease. Not only are there are health benefits to physical activity, the CDC also reports that physical activity would decrease health care costs, increase property values, and people  who are physically active generally have less sick days. Resource https://www-sciencedirect-com.libproxy.http://www.castillo-fisher.org/, CDC Encouraged patient to honor their body's internal hunger and fullness cues.  Throughout the day, check in mentally and rate hunger. Stop eating when satisfied not full regardless of how much food is left on the plate.  Get more if still hungry 20-30 minutes later.  The key is to honor satisfaction so throughout the meal, rate fullness factor and stop when comfortably satisfied not physically full. The key is to honor hunger and fullness without any feelings of guilt or shame.  Pay attention to what the internal cues are, rather than any external factors. This will enhance the confidence you have in listening to your own body and following those internal cues enabling you to increase how often you eat when you are hungry not out of appetite and stop when you are satisfied not full.  Encouraged pt to continue to eat balanced meals inclusive of non starchy vegetables 2 times a day 7 days a week Encouraged pt to choose lean protein sources: limiting beef, pork, sausage, hotdogs, and lunch meat Encourage pt to choose healthy fats such as plant based limiting animal fats Encouraged pt to continue to drink a minium 64 fluid ounces with half being plain water to satisfy proper hydration    Progress Towards Goal(s):  In Progress Teaching method utilized: Environmental health practitioner & Auditory  Demonstrated degree of understanding via: Teach Back  Readiness Level: Action Barriers to learning/adherence to lifestyle change: none identified   Teaching Method Utilized:  Visual Auditory Hands on  Demonstrated degree of understanding via:  Teach Back   Monitoring/Evaluation:  Dietary intake, exercise, and body weight.

## 2022-05-21 ENCOUNTER — Ambulatory Visit (INDEPENDENT_AMBULATORY_CARE_PROVIDER_SITE_OTHER): Payer: Medicare Other | Admitting: Psychology

## 2022-05-21 DIAGNOSIS — F3289 Other specified depressive episodes: Secondary | ICD-10-CM
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
## 2022-05-22 DIAGNOSIS — J0100 Acute maxillary sinusitis, unspecified: Secondary | ICD-10-CM | POA: Diagnosis not present

## 2022-05-28 ENCOUNTER — Ambulatory Visit (INDEPENDENT_AMBULATORY_CARE_PROVIDER_SITE_OTHER): Payer: Medicare Other | Admitting: Psychology

## 2022-05-28 DIAGNOSIS — F3289 Other specified depressive episodes: Secondary | ICD-10-CM
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
## 2022-05-30 ENCOUNTER — Telehealth: Payer: Self-pay

## 2022-05-30 NOTE — Telephone Encounter (Signed)
LVM for patient to call back and schedule an appointment

## 2022-06-04 ENCOUNTER — Ambulatory Visit (INDEPENDENT_AMBULATORY_CARE_PROVIDER_SITE_OTHER): Payer: Medicare Other | Admitting: Physician Assistant

## 2022-06-04 DIAGNOSIS — E881 Lipodystrophy, not elsewhere classified: Secondary | ICD-10-CM

## 2022-06-04 NOTE — Progress Notes (Signed)
This is a 51 year old female who is status post bilateral excision of axillary skin and soft tissue and fat on 03/28/2022 with Dr. Marla Roe.  Patient presents to clinic for follow-up evaluation.  She was most recently seen in the clinic on 04/30/2022.  At that time she had several suture knots that were noted and a small area of firmness to the posterior aspect of the left incision.  As well as a small superficial 0.25 cm wound to the medial aspect of the incision.  Since her last office visit she denies any significant complaints, she notes one area on the left incision that the suture is poking through.  She notes some excess skin along the lateral aspect of the left incision, but otherwise has no issues with wounds or any infectious symptoms.  Chaperone present.  On exam the patient is sitting up in no acute distress.  The bilateral axillary incisions are clean dry and intact with routine healing, no surrounding redness discharge warmth or areas of firmness.  She does have a small dogear at the left lateral incision.  Overall the patient is doing well, she appears happy with her cosmetic outcome, and would like to have the excess skin from her arms removed as well.  I did have a lengthy discussion with her on scar creams/gels and silicone sheets.  She is concerned with the scarring along the left lateral incision, I did give her options, it does appear that the silicone gel would be a better option for her.  She would like to proceed with purchasing this.  At this time the patient may resume normal activities, she can follow-up with [place] on a as needed basis.  The patient verbalized understanding and agreement to today's plan had no further questions or concerns.

## 2022-06-05 DIAGNOSIS — E785 Hyperlipidemia, unspecified: Secondary | ICD-10-CM | POA: Diagnosis not present

## 2022-06-05 DIAGNOSIS — E1169 Type 2 diabetes mellitus with other specified complication: Secondary | ICD-10-CM | POA: Diagnosis not present

## 2022-06-11 ENCOUNTER — Ambulatory Visit (INDEPENDENT_AMBULATORY_CARE_PROVIDER_SITE_OTHER): Payer: Medicare Other | Admitting: Psychology

## 2022-06-11 DIAGNOSIS — F3289 Other specified depressive episodes: Secondary | ICD-10-CM
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
## 2022-06-14 ENCOUNTER — Ambulatory Visit: Payer: Self-pay | Admitting: *Deleted

## 2022-06-16 ENCOUNTER — Encounter: Payer: Self-pay | Admitting: *Deleted

## 2022-06-16 NOTE — Patient Instructions (Signed)
Visit Information  Thank you for taking time to visit with me today. Please don't hesitate to contact me if I can be of assistance to you.   Please call the care guide team at [phone] if you need to cancel or reschedule your appointment.   If you are experiencing a Mental Health or Behavioral Health Crisis or need someone to talk to, please call the Suicide and Crisis Lifeline: 988 call the [place] National Suicide Prevention Lifeline: 1-800-273-8255 or TTY: 1-800-799-4 TTY (1-800-799-4889) to talk to a trained counselor call 1-800-273-TALK (toll free, 24 hour hotline) go to [facility] [address], [place] ([phone]) call the [place] Crisis Line: 800-939-9988 call 911  Patient verbalizes understanding of instructions and care plan provided today and agrees to view in MyChart. Active MyChart status and patient understanding of how to access instructions and care plan via MyChart confirmed with patient.     No further follow up required.  Rochester Serpe, BSW, MSW, LCSW  Licensed Clinical Social Worker  [facility]  Mailing Address-[address], [place], [place] [address] Physical Address-[address], [place], [place] [address] Toll Free Main # [phone] Fax # [phone] Cell # [phone] [email or web address]            

## 2022-06-16 NOTE — Patient Outreach (Signed)
  Care Coordination   Initial Visit Note   06/16/2022  Name: Kristina [name] MRN: [id number] DOB: 11-28-1970  Kristina [name] is a 51 y.o. year old female who sees Pablo Lawrence, NP for primary care. I spoke with Kristina [name] by phone today.  What matters to the patients health and wellness today?   No Interventions Identified.  SDOH assessments and interventions completed:  Yes.  SDOH Interventions Today    Flowsheet Row Most Recent Value  SDOH Interventions   Food Insecurity Interventions Intervention Not Indicated  Housing Interventions Intervention Not Indicated  Transportation Interventions Intervention Not Indicated  Utilities Interventions Intervention Not Indicated  Alcohol Usage Interventions Intervention Not Indicated (Score <7)  Financial Strain Interventions Intervention Not Indicated  Physical Activity Interventions Patient Refused  Stress Interventions Intervention Not Indicated  Social Connections Interventions Intervention Not Indicated     Care Coordination Interventions Activated:  Yes.    Care Coordination Interventions:  Yes, provided.    Follow up plan: No further intervention required.    Encounter Outcome:  Pt. Visit Completed.    Nat Christen, BSW, MSW, LCSW  Licensed Education officer, [facility]  Mailing [place] [address], [place], [place] [address] Physical Address-[address], [place], [place] [address] Toll Free Main # [phone] Fax # [phone] Cell # [phone] [email or web address]

## 2022-06-18 ENCOUNTER — Ambulatory Visit: Payer: Medicare Other | Admitting: Psychology

## 2022-06-25 ENCOUNTER — Ambulatory Visit (INDEPENDENT_AMBULATORY_CARE_PROVIDER_SITE_OTHER): Payer: Medicare Other | Admitting: Psychology

## 2022-06-25 DIAGNOSIS — F3289 Other specified depressive episodes: Secondary | ICD-10-CM

## 2022-06-25 DIAGNOSIS — F319 Bipolar disorder, unspecified: Secondary | ICD-10-CM | POA: Diagnosis not present
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
## 2022-07-02 ENCOUNTER — Ambulatory Visit (INDEPENDENT_AMBULATORY_CARE_PROVIDER_SITE_OTHER): Payer: Medicare Other | Admitting: Psychology

## 2022-07-02 DIAGNOSIS — F3289 Other specified depressive episodes: Secondary | ICD-10-CM

## 2022-07-02 DIAGNOSIS — F5105 Insomnia due to other mental disorder: Secondary | ICD-10-CM | POA: Insufficient documentation
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
## 2022-07-09 ENCOUNTER — Ambulatory Visit: Payer: Medicare Other | Admitting: Psychology

## 2022-07-10 DIAGNOSIS — N369 Urethral disorder, unspecified: Secondary | ICD-10-CM | POA: Diagnosis not present

## 2022-07-10 DIAGNOSIS — L987 Excessive and redundant skin and subcutaneous tissue: Secondary | ICD-10-CM | POA: Diagnosis not present

## 2022-07-16 ENCOUNTER — Ambulatory Visit (INDEPENDENT_AMBULATORY_CARE_PROVIDER_SITE_OTHER): Payer: Medicare Other | Admitting: Psychology

## 2022-07-16 DIAGNOSIS — F3289 Other specified depressive episodes: Secondary | ICD-10-CM | POA: Diagnosis not present
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
## 2022-07-23 ENCOUNTER — Ambulatory Visit: Payer: Medicare Other | Admitting: Psychology

## 2022-07-30 ENCOUNTER — Ambulatory Visit (INDEPENDENT_AMBULATORY_CARE_PROVIDER_SITE_OTHER): Payer: Medicare Other | Admitting: Psychology

## 2022-07-30 DIAGNOSIS — F3289 Other specified depressive episodes: Secondary | ICD-10-CM

## 2022-07-30 DIAGNOSIS — F900 Attention-deficit hyperactivity disorder, predominantly inattentive type: Secondary | ICD-10-CM | POA: Diagnosis not present
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
## 2022-08-13 ENCOUNTER — Ambulatory Visit: Payer: Medicare Other | Admitting: Psychology

## 2022-08-20 ENCOUNTER — Ambulatory Visit: Payer: Medicare Other | Admitting: Psychology

## 2022-08-20 DIAGNOSIS — R6889 Other general symptoms and signs: Secondary | ICD-10-CM | POA: Diagnosis not present

## 2022-08-20 DIAGNOSIS — J0100 Acute maxillary sinusitis, unspecified: Secondary | ICD-10-CM | POA: Diagnosis not present

## 2022-08-20 DIAGNOSIS — R509 Fever, unspecified: Secondary | ICD-10-CM | POA: Diagnosis not present

## 2022-08-30 DIAGNOSIS — E1169 Type 2 diabetes mellitus with other specified complication: Secondary | ICD-10-CM | POA: Diagnosis not present

## 2022-08-30 DIAGNOSIS — Z79899 Other long term (current) drug therapy: Secondary | ICD-10-CM | POA: Diagnosis not present

## 2022-08-30 DIAGNOSIS — Z9884 Bariatric surgery status: Secondary | ICD-10-CM | POA: Diagnosis not present

## 2022-08-30 DIAGNOSIS — Z23 Encounter for immunization: Secondary | ICD-10-CM | POA: Diagnosis not present

## 2022-08-30 DIAGNOSIS — E785 Hyperlipidemia, unspecified: Secondary | ICD-10-CM | POA: Diagnosis not present

## 2022-08-30 DIAGNOSIS — E860 Dehydration: Secondary | ICD-10-CM | POA: Diagnosis not present

## 2022-09-02 ENCOUNTER — Encounter: Payer: Medicare Other | Attending: General Surgery | Admitting: Skilled Nursing Facility1

## 2022-09-02 VITALS — Ht 63.0 in | Wt 164.0 lb

## 2022-09-02 DIAGNOSIS — E669 Obesity, unspecified: Secondary | ICD-10-CM | POA: Insufficient documentation

## 2022-09-02 DIAGNOSIS — E119 Type 2 diabetes mellitus without complications: Secondary | ICD-10-CM | POA: Insufficient documentation

## 2022-09-02 NOTE — Progress Notes (Signed)
Follow-up visit:  Post-Operative Surgery   Surgery date: 06/26/2021 Surgery type: sleeve to RYGB Start weight at [facility]: 214.7 Weight today: 197.8 pounds   Body Composition Scale 07/10/2021 08/08/2021 10/31/2021 05/15/2022 09/02/22  Current Body Weight 213.6 205.1 197.8 182.1 164.0  Total Body Fat % 42.2 41.2 40.3 37.7 34.4  Visceral Fat '14 13 12 11 9  '$ Fat-Free Mass % 57.7 58.7 59.6 62.2 65.5   Total Body Water % 43.3 43.8 44.3 45.6 47.2  Muscle-Mass lbs 30.3 30.2 30 29.8 29.5  BMI 37.4 36 34.6 31.8 28.7  Body Fat Displacement              Torso  lbs 55.8 52.3 49.3 42.4 34.9         Left Leg  lbs 11.1 10.4 9.8 8.4 6.9         Right Leg  lbs 11.1 10.4 9.8 834 6.9         Left Arm  lbs 5.5 5.2 4.9 4.2 3.4         Right Arm   lbs 5.5 5.2 4.9 4.2 3.4   Clinical  Medical hx: GERD Medications: monjouro, thyroid Labs: B 12 1710, A1C 5.3 Notable signs/symptoms: none reported. Any previous deficiencies? anemia Fluids: 70-120 ounces  Pt states she has been tired lately. Pt state she has gone off Menjaro. Pt states she just added carbohydrates back in to her diet.  Pt states she wants to get back to eating more healthful snacks throughout the day.  Pt states she has salads 3 nights a week.  24 hr recall: First meal: 4 mini donuts and coffee or toast and egg Snacks: Lantz crackers Second meal: 3 grilled chicken tenders, mashed potatoes, and lima beans Snack: Lantz crackers (honey and peanut butter) Third meal: 2 slices of cauliflower pizza (pepperoni and mushroom)   Fluid intake: water, 2 flavored water/day, 1-2 unsweet tea (16 oz.), 1 cup of coffee + 1 scoop of Premiere protein powder   Medications: See List Supplementation: multi and calcium, B 12 (once month), vitamin D   Using straws: no Drinking while eating: no Having you been chewing well: yes Chewing/swallowing difficulties: no Changes in vision: no Changes to mood/headaches: no Hair loss/Cahnges to skin/Changes to  nails: no Any difficulty focusing or concentrating: no Sweating: no Dizziness/Lightheaded: no Palpitations: no  Carbonated beverages: no N/V/D/C/GAS: no Abdominal Pain: no Dumping syndrome: no  Recent physical activity: water aerobics, boxing, and walking 3-4 days/weeks   Education Topics: Why you need complex carbohydrates: Whole grains and other complex carbohydrates are required to have a healthy diet. Whole grains provide fiber which can help with blood glucose levels and help keep you satiated. Fruits and starchy vegetables provide essential vitamins and minerals required for immune function, eyesight support, brain support, bone density, wound healing and many other functions within the body. According to the current evidenced based 2020-2025 Dietary Guidelines for Americans, complex carbohydrates are part of a healthy eating pattern which is associated with a decreased risk for type 2 diabetes, cancers, and cardiovascular disease.  Why physical activity is needed:  Physical activity has various benefits for the body such as reducing stress, helping control blood glucose levels, strengthening the heart, reducing incidence of cognitive impairment, and has a neuroprotective effect on memory function. Research reports that physical activity (whether it be aerobic, resistance training, or high intensity interval training) help control the level of reactive oxygen species which can lead to oxidative stress on the brain therefor possibly impairing  memory, learning abilities, and other cognitive functions. The CDC recognizes that physical activity can help prevent premature death, may prevent the development of type 2 diabetes, different cancers, and heart disease. Not only are there are health benefits to physical activity, the CDC also reports that physical activity would decrease health care costs, increase property values, and people who are physically active generally have less sick days. Resource  https://www-sciencedirect-com.libproxy.http://www.castillo-fisher.org/, CDC Encouraged patient to honor their body's internal hunger and fullness cues.  Throughout the day, check in mentally and rate hunger. Stop eating when satisfied not full regardless of how much food is left on the plate.  Get more if still hungry 20-30 minutes later.  The key is to honor satisfaction so throughout the meal, rate fullness factor and stop when comfortably satisfied not physically full. The key is to honor hunger and fullness without any feelings of guilt or shame.  Pay attention to what the internal cues are, rather than any external factors. This will enhance the confidence you have in listening to your own body and following those internal cues enabling you to increase how often you eat when you are hungry not out of appetite and stop when you are satisfied not full.  Encouraged pt to continue to eat balanced meals inclusive of non starchy vegetables 2 times a day 7 days a week Encouraged pt to choose lean protein sources: limiting beef, pork, sausage, hotdogs, and lunch meat Encourage pt to choose healthy fats such as plant based limiting animal fats Encouraged pt to continue to drink a minium 64 fluid ounces with half being plain water to satisfy proper hydration    Progress Towards Goal(s):  In Progress Teaching method utilized: Visual & Auditory  Demonstrated degree of understanding via: Teach Back  Readiness Level: Action Barriers to learning/adherence to lifestyle change: none identified  Goals: - Adding in more healthful snacks throughout the day. - Continuing adding physical activity into weekly routine.   Teaching Method Utilized:  Visual Auditory Hands on  Demonstrated degree of understanding via:  Teach Back   Monitoring/Evaluation:  Dietary intake, exercise, and body weight.

## 2022-09-03 ENCOUNTER — Ambulatory Visit (INDEPENDENT_AMBULATORY_CARE_PROVIDER_SITE_OTHER): Payer: Medicare Other | Admitting: Psychology

## 2022-09-03 DIAGNOSIS — F900 Attention-deficit hyperactivity disorder, predominantly inattentive type: Secondary | ICD-10-CM

## 2022-09-03 DIAGNOSIS — F3289 Other specified depressive episodes: Secondary | ICD-10-CM | POA: Diagnosis not present
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
## 2022-09-04 DIAGNOSIS — E860 Dehydration: Secondary | ICD-10-CM | POA: Diagnosis not present

## 2022-09-04 DIAGNOSIS — Z9884 Bariatric surgery status: Secondary | ICD-10-CM | POA: Diagnosis not present

## 2022-09-18 DIAGNOSIS — Z9884 Bariatric surgery status: Secondary | ICD-10-CM | POA: Diagnosis not present

## 2022-09-18 DIAGNOSIS — E860 Dehydration: Secondary | ICD-10-CM | POA: Diagnosis not present

## 2022-09-25 ENCOUNTER — Encounter: Payer: Self-pay | Admitting: Hematology and Oncology

## 2022-10-02 ENCOUNTER — Ambulatory Visit: Payer: Medicare Other | Admitting: Psychology

## 2022-10-02 DIAGNOSIS — E860 Dehydration: Secondary | ICD-10-CM | POA: Diagnosis not present

## 2022-10-02 DIAGNOSIS — Z9884 Bariatric surgery status: Secondary | ICD-10-CM | POA: Diagnosis not present

## 2022-10-03 ENCOUNTER — Inpatient Hospital Stay: Payer: 59 | Attending: Hematology and Oncology

## 2022-10-03 ENCOUNTER — Other Ambulatory Visit: Payer: Self-pay

## 2022-10-03 DIAGNOSIS — D509 Iron deficiency anemia, unspecified: Secondary | ICD-10-CM | POA: Diagnosis not present

## 2022-10-03 LAB — CBC WITH DIFFERENTIAL (CANCER CENTER ONLY)
Abs Immature Granulocytes: 0.01 10*3/uL (ref 0.00–0.07)
Basophils Absolute: 0.1 10*3/uL (ref 0.0–0.1)
Basophils Relative: 1 %
Eosinophils Absolute: 0.2 10*3/uL (ref 0.0–0.5)
Eosinophils Relative: 4 %
HCT: 38.5 % (ref 36.0–46.0)
Hemoglobin: 13.4 g/dL (ref 12.0–15.0)
Immature Granulocytes: 0 %
Lymphocytes Relative: 44 %
Lymphs Abs: 2.1 10*3/uL (ref 0.7–4.0)
MCH: 30.7 pg (ref 26.0–34.0)
MCHC: 34.8 g/dL (ref 30.0–36.0)
MCV: 88.1 fL (ref 80.0–100.0)
Monocytes Absolute: 0.4 10*3/uL (ref 0.1–1.0)
Monocytes Relative: 8 %
Neutro Abs: 2.1 10*3/uL (ref 1.7–7.7)
Neutrophils Relative %: 43 %
Platelet Count: 175 10*3/uL (ref 150–400)
RBC: 4.37 MIL/uL (ref 3.87–5.11)
RDW: 12.6 % (ref 11.5–15.5)
WBC Count: 4.8 10*3/uL (ref 4.0–10.5)
nRBC: 0 % (ref 0.0–0.2)

## 2022-10-03 LAB — IRON AND IRON BINDING CAPACITY (CC-WL,HP ONLY)
Iron: 106 ug/dL (ref 28–170)
Saturation Ratios: 28 % (ref 10.4–31.8)
TIBC: 374 ug/dL (ref 250–450)
UIBC: 268 ug/dL (ref 148–442)

## 2022-10-03 LAB — FERRITIN: Ferritin: 53 ng/mL (ref 11–307)

## 2022-10-07 ENCOUNTER — Inpatient Hospital Stay (HOSPITAL_BASED_OUTPATIENT_CLINIC_OR_DEPARTMENT_OTHER): Payer: 59 | Admitting: Hematology and Oncology

## 2022-10-07 DIAGNOSIS — D509 Iron deficiency anemia, unspecified: Secondary | ICD-10-CM | POA: Diagnosis not present

## 2022-10-07 NOTE — Assessment & Plan Note (Signed)
Lab review 05/24/2020: Hemoglobin 13.6 (on 05/17/2027 was 11.5), MCV 87.3, ferritin 161, iron saturation 36% Status post bariatric surgery (laparoscopic sleeve gastrectomy with hiatal hernia repair 09/19/2015) 03/28/2021: Hemoglobin 13.4, ferritin 80, iron saturation 17% 10/02/21: Hb 12.8, Iron Sat 18%, Ferritin 52  10/03/2022: Hemoglobin 13.4, MCV 88.1, ferritin 53, iron saturation 28%  No role of IV iron at this time.   IV iron: June 2021 (when the ferritin was at 9)   RTC on an as-needed basis.

## 2022-10-07 NOTE — Progress Notes (Signed)
HEMATOLOGY-ONCOLOGY TELEPHONE VISIT PROGRESS NOTE  I connected with our patient on 10/07/22 at  9:15 AM EST by telephone and verified that I am speaking with the correct person using two identifiers.  I discussed the limitations, risks, security and privacy concerns of performing an evaluation and management service by telephone and the availability of in person appointments.  I also discussed with the patient that there may be a patient responsible charge related to this service. The patient expressed understanding and agreed to proceed.   History of Present Illness:  Kristina [name] is a 52 y.o. female with above-mentioned history of iron deficiency anemia treated with IV iron. She presents via telephone today for follow-up.  She reports no issues or concerns.  Recently she had blood work which showed no evidence of iron deficiency.    REVIEW OF SYSTEMS:   Constitutional: Denies fevers, chills or abnormal weight loss All other systems were reviewed with the patient and are negative. Observations/Objective:     Assessment Plan:  Iron deficiency anemia Lab review 05/24/2020: Hemoglobin 13.6 (on 05/17/2027 was 11.5), MCV 87.3, ferritin 161, iron saturation 36% Status post bariatric surgery (laparoscopic sleeve gastrectomy with hiatal hernia repair 09/19/2015) 03/28/2021: Hemoglobin 13.4, ferritin 80, iron saturation 17% 10/02/21: Hb 12.8, Iron Sat 18%, Ferritin 52  10/03/2022: Hemoglobin 13.4, MCV 88.1, ferritin 53, iron saturation 28%  No role of IV iron at this time.   IV iron: June 2021 (when the ferritin was at 9) I discussed with her to come back to see [place] if her ferritin levels were to drop below 20  RTC on an as-needed basis.     I discussed the assessment and treatment plan with the patient. The patient was provided an opportunity to ask questions and all were answered. The patient agreed with the plan and demonstrated an understanding of the instructions. The patient was advised to  call back or seek an in-person evaluation if the symptoms worsen or if the condition fails to improve as anticipated.   I provided 12 minutes of non-face-to-face time during this encounter.  This includes time for charting and coordination of care   Harriette Ohara, MD  I [name] am acting as a scribe for Dr.Raji Glinski  I have reviewed the above documentation for accuracy and completeness, and I agree with the above.

## 2022-10-08 ENCOUNTER — Ambulatory Visit (INDEPENDENT_AMBULATORY_CARE_PROVIDER_SITE_OTHER): Payer: 59 | Admitting: Psychology

## 2022-10-08 DIAGNOSIS — F3289 Other specified depressive episodes: Secondary | ICD-10-CM

## 2022-10-08 DIAGNOSIS — F900 Attention-deficit hyperactivity disorder, predominantly inattentive type: Secondary | ICD-10-CM | POA: Diagnosis not present
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
## 2022-10-09 DIAGNOSIS — Z1231 Encounter for screening mammogram for malignant neoplasm of breast: Secondary | ICD-10-CM | POA: Diagnosis not present

## 2022-10-09 DIAGNOSIS — J0100 Acute maxillary sinusitis, unspecified: Secondary | ICD-10-CM | POA: Diagnosis not present

## 2022-10-15 ENCOUNTER — Ambulatory Visit (INDEPENDENT_AMBULATORY_CARE_PROVIDER_SITE_OTHER): Payer: 59 | Admitting: Psychology

## 2022-10-15 DIAGNOSIS — F900 Attention-deficit hyperactivity disorder, predominantly inattentive type: Secondary | ICD-10-CM

## 2022-10-15 DIAGNOSIS — F3289 Other specified depressive episodes: Secondary | ICD-10-CM
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
## 2022-10-22 ENCOUNTER — Encounter: Payer: Self-pay | Admitting: Hematology and Oncology

## 2022-10-25 ENCOUNTER — Encounter: Payer: Self-pay | Admitting: Hematology and Oncology

## 2022-10-25 ENCOUNTER — Other Ambulatory Visit (HOSPITAL_COMMUNITY): Payer: Self-pay

## 2022-10-25 MED ORDER — LISDEXAMFETAMINE DIMESYLATE 40 MG PO CHEW
40.0000 mg | CHEWABLE_TABLET | Freq: Every day | ORAL | 0 refills | Status: DC
  Filled 2022-10-25: qty 30, 30d supply, fill #0

## 2022-10-29 ENCOUNTER — Ambulatory Visit (INDEPENDENT_AMBULATORY_CARE_PROVIDER_SITE_OTHER): Payer: 59 | Admitting: Psychology

## 2022-10-29 DIAGNOSIS — F900 Attention-deficit hyperactivity disorder, predominantly inattentive type: Secondary | ICD-10-CM | POA: Diagnosis not present

## 2022-10-29 DIAGNOSIS — F3289 Other specified depressive episodes: Secondary | ICD-10-CM
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
## 2022-11-08 ENCOUNTER — Ambulatory Visit: Payer: 59 | Admitting: Psychology

## 2022-11-12 ENCOUNTER — Ambulatory Visit: Payer: 59 | Admitting: Psychology

## 2022-11-21 ENCOUNTER — Other Ambulatory Visit (HOSPITAL_COMMUNITY): Payer: Self-pay

## 2022-11-25 ENCOUNTER — Other Ambulatory Visit (HOSPITAL_COMMUNITY): Payer: Self-pay

## 2022-11-26 ENCOUNTER — Ambulatory Visit: Payer: 59 | Admitting: Psychology

## 2022-11-28 ENCOUNTER — Other Ambulatory Visit (HOSPITAL_COMMUNITY): Payer: Self-pay

## 2022-11-28 MED ORDER — LISDEXAMFETAMINE DIMESYLATE 40 MG PO CHEW
40.0000 mg | CHEWABLE_TABLET | Freq: Every day | ORAL | 0 refills | Status: DC
  Filled 2022-11-28: qty 30, 30d supply, fill #0

## 2022-12-02 ENCOUNTER — Other Ambulatory Visit (HOSPITAL_COMMUNITY): Payer: Self-pay

## 2022-12-02 MED ORDER — MOUNJARO 12.5 MG/0.5ML ~~LOC~~ SOAJ
12.5000 mg | SUBCUTANEOUS | 3 refills | Status: DC
  Filled 2022-12-02: qty 2, 28d supply, fill #0

## 2022-12-02 MED ORDER — LISDEXAMFETAMINE DIMESYLATE 20 MG PO CHEW
20.0000 mg | CHEWABLE_TABLET | Freq: Two times a day (BID) | ORAL | 0 refills | Status: DC
  Filled 2022-12-02: qty 60, 30d supply, fill #0

## 2022-12-05 ENCOUNTER — Other Ambulatory Visit (HOSPITAL_COMMUNITY): Payer: Self-pay

## 2022-12-10 ENCOUNTER — Other Ambulatory Visit (HOSPITAL_COMMUNITY): Payer: Self-pay

## 2022-12-10 ENCOUNTER — Institutional Professional Consult (permissible substitution): Payer: 59 | Admitting: Plastic Surgery

## 2022-12-10 ENCOUNTER — Ambulatory Visit (INDEPENDENT_AMBULATORY_CARE_PROVIDER_SITE_OTHER): Payer: 59 | Admitting: Psychology

## 2022-12-10 DIAGNOSIS — F3289 Other specified depressive episodes: Secondary | ICD-10-CM

## 2022-12-10 DIAGNOSIS — F900 Attention-deficit hyperactivity disorder, predominantly inattentive type: Secondary | ICD-10-CM
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
## 2022-12-19 ENCOUNTER — Other Ambulatory Visit (HOSPITAL_COMMUNITY): Payer: Self-pay

## 2022-12-25 ENCOUNTER — Ambulatory Visit (INDEPENDENT_AMBULATORY_CARE_PROVIDER_SITE_OTHER): Payer: 59 | Admitting: Psychology

## 2022-12-25 ENCOUNTER — Other Ambulatory Visit (HOSPITAL_COMMUNITY): Payer: Self-pay

## 2022-12-25 DIAGNOSIS — F3289 Other specified depressive episodes: Secondary | ICD-10-CM | POA: Diagnosis not present

## 2022-12-25 DIAGNOSIS — F900 Attention-deficit hyperactivity disorder, predominantly inattentive type: Secondary | ICD-10-CM
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
## 2023-01-08 ENCOUNTER — Ambulatory Visit: Payer: 59 | Admitting: Psychology

## 2023-01-16 ENCOUNTER — Other Ambulatory Visit: Payer: Self-pay

## 2023-01-16 ENCOUNTER — Other Ambulatory Visit (HOSPITAL_COMMUNITY): Payer: Self-pay

## 2023-01-16 MED ORDER — LISDEXAMFETAMINE DIMESYLATE 20 MG PO CHEW
20.0000 mg | CHEWABLE_TABLET | Freq: Two times a day (BID) | ORAL | 0 refills | Status: DC
  Filled 2023-01-16: qty 60, 30d supply, fill #0

## 2023-01-17 ENCOUNTER — Other Ambulatory Visit (HOSPITAL_COMMUNITY): Payer: Self-pay

## 2023-01-17 ENCOUNTER — Other Ambulatory Visit: Payer: Self-pay

## 2023-01-21 ENCOUNTER — Ambulatory Visit: Payer: 59 | Admitting: Psychology

## 2023-01-23 ENCOUNTER — Encounter: Payer: Self-pay | Admitting: Hematology and Oncology

## 2023-01-28 ENCOUNTER — Institutional Professional Consult (permissible substitution): Payer: 59 | Admitting: Plastic Surgery

## 2023-02-05 ENCOUNTER — Ambulatory Visit (INDEPENDENT_AMBULATORY_CARE_PROVIDER_SITE_OTHER): Payer: 59 | Admitting: Psychology

## 2023-02-05 DIAGNOSIS — F3289 Other specified depressive episodes: Secondary | ICD-10-CM | POA: Diagnosis not present

## 2023-02-05 DIAGNOSIS — F900 Attention-deficit hyperactivity disorder, predominantly inattentive type: Secondary | ICD-10-CM | POA: Diagnosis not present
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
## 2023-02-18 ENCOUNTER — Other Ambulatory Visit (HOSPITAL_COMMUNITY): Payer: Self-pay

## 2023-02-18 ENCOUNTER — Telehealth: Payer: Self-pay | Admitting: Internal Medicine

## 2023-02-18 MED ORDER — LISDEXAMFETAMINE DIMESYLATE 20 MG PO CHEW
20.0000 mg | CHEWABLE_TABLET | Freq: Two times a day (BID) | ORAL | 0 refills | Status: DC
  Filled 2023-02-18 – 2023-02-19 (×2): qty 60, 30d supply, fill #0

## 2023-02-18 MED ORDER — LISDEXAMFETAMINE DIMESYLATE 20 MG PO CHEW
20.0000 mg | CHEWABLE_TABLET | Freq: Two times a day (BID) | ORAL | 0 refills | Status: DC
  Filled 2023-02-18: qty 60, 30d supply, fill #0

## 2023-02-18 NOTE — Telephone Encounter (Signed)
Patient called regarding a referral we received for an enlarged liver she is requesting to speak with a nurse.

## 2023-02-19 ENCOUNTER — Other Ambulatory Visit (HOSPITAL_COMMUNITY): Payer: Self-pay

## 2023-02-19 ENCOUNTER — Ambulatory Visit (INDEPENDENT_AMBULATORY_CARE_PROVIDER_SITE_OTHER): Payer: 59 | Admitting: Psychology

## 2023-02-19 DIAGNOSIS — F3289 Other specified depressive episodes: Secondary | ICD-10-CM

## 2023-02-19 DIAGNOSIS — F900 Attention-deficit hyperactivity disorder, predominantly inattentive type: Secondary | ICD-10-CM | POA: Diagnosis not present

## 2023-02-19 NOTE — Telephone Encounter (Signed)
Left message for pt to call back  °

## 2023-02-19 NOTE — Telephone Encounter (Signed)
Patient is retuning your call 
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
## 2023-02-19 NOTE — Telephone Encounter (Signed)
Pt was notified that we have a special team that handles the referrals and that  they will contact her. Pt was notified that if she has not been contacted in 1 or 2 weeks then to please contact our office. Pt verbalized understanding with all questions answered.

## 2023-02-20 ENCOUNTER — Other Ambulatory Visit (HOSPITAL_COMMUNITY): Payer: Self-pay

## 2023-02-20 MED ORDER — LISDEXAMFETAMINE DIMESYLATE 40 MG PO CHEW
40.0000 mg | CHEWABLE_TABLET | Freq: Every morning | ORAL | 0 refills | Status: DC
  Filled 2023-02-20: qty 30, 30d supply, fill #0

## 2023-02-25 ENCOUNTER — Ambulatory Visit (INDEPENDENT_AMBULATORY_CARE_PROVIDER_SITE_OTHER): Payer: 59 | Admitting: Internal Medicine

## 2023-02-25 ENCOUNTER — Encounter: Payer: Self-pay | Admitting: Internal Medicine

## 2023-02-25 ENCOUNTER — Encounter: Payer: Self-pay | Admitting: *Deleted

## 2023-02-25 ENCOUNTER — Other Ambulatory Visit (INDEPENDENT_AMBULATORY_CARE_PROVIDER_SITE_OTHER): Payer: 59

## 2023-02-25 ENCOUNTER — Encounter: Payer: Self-pay | Admitting: Plastic Surgery

## 2023-02-25 ENCOUNTER — Ambulatory Visit (INDEPENDENT_AMBULATORY_CARE_PROVIDER_SITE_OTHER): Payer: 59 | Admitting: Plastic Surgery

## 2023-02-25 VITALS — BP 120/80 | HR 100 | Ht 63.0 in | Wt 164.0 lb

## 2023-02-25 VITALS — BP 122/84 | HR 98 | Ht 63.0 in | Wt 164.2 lb

## 2023-02-25 DIAGNOSIS — R1011 Right upper quadrant pain: Secondary | ICD-10-CM

## 2023-02-25 DIAGNOSIS — R10811 Right upper quadrant abdominal tenderness: Secondary | ICD-10-CM

## 2023-02-25 DIAGNOSIS — E881 Lipodystrophy, not elsewhere classified: Secondary | ICD-10-CM | POA: Diagnosis not present

## 2023-02-25 DIAGNOSIS — R160 Hepatomegaly, not elsewhere classified: Secondary | ICD-10-CM

## 2023-02-25 DIAGNOSIS — Z719 Counseling, unspecified: Secondary | ICD-10-CM

## 2023-02-25 LAB — HEPATIC FUNCTION PANEL
ALT: 30 U/L (ref 0–35)
AST: 24 U/L (ref 0–37)
Albumin: 4.1 g/dL (ref 3.5–5.2)
Alkaline Phosphatase: 93 U/L (ref 39–117)
Bilirubin, Direct: 0.1 mg/dL (ref 0.0–0.3)
Total Bilirubin: 0.5 mg/dL (ref 0.2–1.2)
Total Protein: 6.3 g/dL (ref 6.0–8.3)

## 2023-02-25 NOTE — Patient Instructions (Signed)

## 2023-02-25 NOTE — Progress Notes (Signed)
Kristina [name] 52 y.o. [birth year] [id number]  Assessment & Plan:   Encounter Diagnoses  Name Primary?   RUQ pain Yes   Hepatomegaly    Right upper quadrant abdominal tenderness without rebound tenderness     Cause not clear.  She is status postcholecystectomy though her most recent CT [facility] indicates gallbladder present that has to be a mistake, could have a common duct stone.  Pattern not quite consistent with that but certainly possible.  Since there is hepatomegaly and these issues I wonder about a hepatitis.  Orders Placed This Encounter  Procedures   Hepatic function panel   Hepatitis, Acute   Further plans pending above could evaluate with MRI pending lab results and clinical course.  Continue acetaminophen as needed at this point.  CC: Roe Rutherford, NP   Subjective:   Chief Complaint: Right upper quadrant pain, hepatomegaly  HPI 52 year old white woman with a history of severe obesity status post sleeve gastrectomy 2017 complicated by reflux and then and then Roux-en-Y bypass 2022 by Dr. Andrey Campanile with excellent weight loss, history of adenomatous colon polyps with last colonoscopy 2023-who developed sudden onset of right upper quadrant pain while sleeping and awakened in late May.  She saw her primary care provider as outlined below.  Workup as below also.  Since that time she has had persistent issues she vomited once when she first noticed this, no new medications she is off Mounjaro times months, no sick contacts.  She has not had any obvious jaundice.  No fever and no prior history with this.  It is tender to touch and lie on her side.  She is using Tylenol with reasonable benefit to control the pain though sleep is not good.  She has some chronic constipation and uses Linzess as needed that has not changed.  PCP 5/30 - abd pain RUQ,N/V and tender palpable liver edge - CT  Argie Ramming, MD - 02/17/2023  Formatting of this note might be different  from the original.  INDICATION: RUQ pain, palpable liver   TECHNIQUE: CT ABDOMEN PELVIS WO W IV CONTRAST. Radiation dose reduction was utilized (automated exposure control, mA or kV adjustment based on patient size, or iterative image reconstruction).  Exam date/time: 02/14/2023 2:21 PM.  Comparison none.   FINDINGS: Lung bases: Clear.   Abdomen/pelvis: Prior gastric bypass. No evidence of bowel obstruction. Liver measures 19.8 cm which is enlarged. No liver lesions. Spleen measures 9.9 cm which is normal in size. Gallbladder present. No biliary ductal dilatation. Bilateral adrenal glands and pancreas are unremarkable. No renal mass or hydronephrosis. Hysterectomy. No adnexal mass. No diverticulitis. Normal appendix.[name]  IMPRESSION:  1. Mild hepatomegaly   Electronically Signed by: Argie Ramming, MD on 02/17/2023 2:16 PM  imen (specimen) Component Ref Range & Units 12 d ago  Glucose 70 - 99 mg/dL 86  BUN 6 - 24 mg/dL 10  Creatinine 4.09 - 8.11 mg/dL 9.14  eGFR >78 GN/FAO/1.30 106  BUN/Creatinine Ratio 9 - 23 15  Sodium 134 - 144 mmol/L 143  Potassium 3.5 - 5.2 mmol/L 4.1  Chloride 96 - 106 mmol/L 106  CO2 20 - 29 mmol/L 24  CALCIUM 8.7 - 10.2 mg/dL 9.7  Total Protein 6.0 - 8.5 g/dL 6.0  Albumin, Serum 3.8 - 4.9 g/dL 4.2  Globulin, Total 1.5 - 4.5 g/dL 1.8  Albumin/Globulin Ratio 1.2 - 2.2 2.3 High   Total Bilirubin 0.0 - 1.2 mg/dL <8.6  Alkaline Phosphatase 44 - 121 IU/L 114  AST 0 - 40 IU/L 24  ALT (SGPT) 0 - 32 IU/L 38 High   Resulting Agency [facility]  Narrative Performed by [facility] Performed at:  [address] [facility] [address], [place], [place]  [id number] Lab Director: Jolene Schimke MD, Phone:  [phone]  Specimen Collected: 02/13/23 11:52   CBC NL PLT 208 A1C 3/18 5.1 AST and ALT 18/32  CT abd/pelvis 09/07/21 - NL liver HEPATOBILIARY: Normal hepatic contours. No intra- or extrahepatic biliary dilatation. Status post  cholecystectomy.  Colonoscopy 11/30/2021  Impression:               - Three diminutive polyps in the descending colon,                            in the transverse colon and in the cecum, removed                            with a cold snare. Resected and retrieved.                           - Diverticulosis in the sigmoid colon.                           - The examination was otherwise normal on direct                            and retroflexion views.                           - Personal history of colonic polyps. 02/2015 - ssp                            and adenoma max 10 mm - repeat colon 2019 3                            diminutive polyps adenoma and SSP pathology                           - FHx CRCA brother  02/2015 - ssp and adenoma max 10 mm - repeat colon 2019 3 diminutive polyps adenoma and SSP pathology 11/30/2021 3 diminutive polyps 2 adenomas and 1 hyperplastic recall 5 yrs 2028  Allergies  Allergen Reactions   Ampicillin Anaphylaxis    Has patient had a PCN reaction causing immediate rash, facial/tongue/throat swelling, SOB or lightheadedness with hypotension: Yes Has patient had a PCN reaction causing severe rash involving mucus membranes or skin necrosis: Yes Has patient had a PCN reaction that required hospitalization Yes Has patient had a PCN reaction occurring within the last 10 years: No If all of the above answers are "NO", then may proceed with Cephalosporin use.    Codeine Hives and Shortness Of Breath    Tolerates morphine, fentanyl, hydromorphone, hydrocodone, and oxycodone.   Sulfa Antibiotics Itching and Rash   Sulfonamide Derivatives Itching and Rash   Current Meds  Medication Sig   ARIPiprazole (ABILIFY) 2 MG tablet Take 2 mg by mouth daily.   buPROPion (WELLBUTRIN SR) 150 MG 12 hr tablet Take 150 mg by mouth 2 (two)  times daily.   Cholecalciferol 1.25 MG (50000 UT) capsule    estradiol (ESTRACE) 0.1 MG/GM vaginal cream    levocetirizine (XYZAL) 5 MG  tablet Take 5 mg by mouth at bedtime.   levothyroxine (SYNTHROID) 112 MCG tablet Take 110 mcg by mouth daily before breakfast.   LINZESS 145 MCG CAPS capsule Take 145 mcg by mouth as needed.   lisdexamfetamine (VYVANSE) 20 MG capsule Take 20 mg by mouth daily.   Multiple Vitamin (MULTIVITAMIN) tablet Take 1 tablet by mouth daily.   ondansetron (ZOFRAN-ODT) 4 MG disintegrating tablet Take 1 tablet (4 mg total) by mouth every 8 (eight) hours as needed for up to 20 doses for nausea or vomiting.   tirzepatide (MOUNJARO) 12.5 MG/0.5ML Pen Inject 12.5 mg into the skin once a week.   Past Medical History:  Diagnosis Date   Allergic rhinitis    Anemia    Anxiety    Aortic dilatation (HCC)    Asthma    Cervical cancer (HCC)    Chronic back pain    Chronic cough    Depression    Diverticulosis    Essential hypertension    GERD (gastroesophageal reflux disease)    History of bronchitis    History of pneumonia    Hx of adenomatous and sessile serrated colonic polyps    Hyperlipidemia    Hypothyroidism    Lumbosacral spondylosis without myelopathy    Bulging disc - chronic pain   Obesity    Bariatric surgery   Obstructive sleep apnea    PONV (postoperative nausea and vomiting)    Restless leg    Tachycardia    Type 2 diabetes mellitus (HCC)    Vitamin D deficiency    Vocal cord dysfunction    Past Surgical History:  Procedure Laterality Date   ABDOMINAL HYSTERECTOMY  1996   CHOLECYSTECTOMY  1994   COLONOSCOPY WITH PROPOFOL N/A 03/10/2015   Procedure: COLONOSCOPY WITH PROPOFOL;  Surgeon: Iva Boop, MD;  Location: [facility];  Service: Endoscopy;  Laterality: N/A;   ESOPHAGOGASTRODUODENOSCOPY N/A 10/27/2013   Procedure: ESOPHAGOGASTRODUODENOSCOPY (EGD);  Surgeon: Iva Boop, MD;  Location: [facility];  Service: Endoscopy;  Laterality: N/A;   GASTRIC ROUX-EN-Y N/A 06/26/2021   Procedure: LAPAROSCOPIC ROUX-EN-Y GASTRIC BYPASS WITH UPPER ENDOSCOPY;  Surgeon: Gaynelle Adu, MD;   Location: [facility];  Service: General;  Laterality: N/A;   GYNECOMASTIA MASTECTOMY Bilateral 03/28/2022   Procedure: Excision of bilateral axillary tissue;  Surgeon: Peggye Form, DO;  Location: [facility];  Service: Plastics;  Laterality: Bilateral;  2 hours   HIATAL HERNIA REPAIR N/A 06/26/2021   Procedure: HERNIA REPAIR HIATAL;  Surgeon: Gaynelle Adu, MD;  Location: [facility];  Service: General;  Laterality: N/A;   KNEE ARTHROSCOPY Right    LAPAROSCOPIC GASTRIC SLEEVE RESECTION WITH HIATAL HERNIA REPAIR N/A 09/19/2015   Procedure: LAPAROSCOPIC GASTRIC SLEEVE RESECTION WITH HIATAL HERNIA REPAIR;  Surgeon: Gaynelle Adu, MD;  Location: [facility];  Service: General;  Laterality: N/A;   LEFT HEART CATH AND CORONARY ANGIOGRAPHY N/A 05/17/2019   Procedure: LEFT HEART CATH AND CORONARY ANGIOGRAPHY;  Surgeon: Corky Crafts, MD;  Location: [facility] IN[facility]AS[facility] CV [facility];  Service: Cardiovascular;  Laterality: N/A;   LIPOSUCTION N/A 11/26/2018   Procedure: LIPOSUCTION;  Surgeon: Peggye Form, DO;  Location: [facility];  Service: Plastics;  Laterality: N/A;   LIPOSUCTION N/A 11/02/2020   Procedure: with liposuction;  Surgeon: Peggye Form, DO;  Location: [facility];  Service: Government social research officer;  Laterality: N/A;   MASS EXCISION N/A 11/02/2020   Procedure: Excision of suprapubic area;  Surgeon: Peggye Form, DO;  Location: [facility];  Service: Plastics;  Laterality: N/A;  90 min   NOSE SURGERY  2004   ovaries removed     PANNICULECTOMY N/A 11/26/2018   Procedure: PANNICULECTOMY;  Surgeon: Peggye Form, DO;  Location: [facility];  Service: Plastics;  Laterality: N/A;   TUBAL LIGATION     UPPER GI ENDOSCOPY  09/19/2015   Procedure: UPPER GI ENDOSCOPY;  Surgeon: Gaynelle Adu, MD;  Location: [facility];  Service: General;;   Social History   Social History Narrative   Disabled   Prior enodoscopy technician [employer]    family history includes Allergies in her child and mother; Anxiety disorder in her mother; Asthma in her brother, brother, mother, son, and son; COPD in her brother and sister; Cancer in her maternal grandfather; Colon cancer (age of onset: 36) in her brother; Depression in her mother; Diabetes in her mother; Emphysema in her mother; Heart attack in her brother and paternal grandfather; Heart disease in her father, maternal grandmother, mother, and paternal grandmother; High Cholesterol in her father and mother; High blood pressure in her father; Hypertension in her brother, daughter, and mother; Hyperthyroidism in her sister; Lung cancer in her maternal aunt; Multiple sclerosis in her paternal grandmother; Ovarian cancer in her mother; Stroke in her maternal grandmother and mother; Sudden death in her father and sister; Thyroid disease in her brother and mother.   Review of Systems As per HPI  Objective:   Physical Exam BP 120/80   Pulse 100   Ht 5\' 3"  (1.6 m)   Wt 164 lb (74.4 kg)   BMI 29.05 kg/m  NAD Anicteric No CVAT Lungs cta Cor NL Abd soft tender RUQ and suspect palpable liver edge - some rib tenderness but not predominant - and negative Carnett's

## 2023-02-25 NOTE — Progress Notes (Signed)
   Subjective:    Patient ID: Kristina [name], female    DOB: December 14, 1970, 52 y.o.   MRN: [id number]  The patient is a 52 year old female.  She is known to the practice and has had surgery before and done well.  She has done an amazing job of weight reduction and keeping it off.  She is 5 feet 3 inches tall and weighs 164 pounds.  She is interested in a brachioplasty.  Even with her weight loss and this excess skin does not improve or tighten.  She would also like the suprapubic area lifted as well.  It was before but she would like it to be lifted more.  She see a GYN and they did not offer any additional surgery.  For her arm she complains that it is difficult to find shirts that fit because of her weight loss and her arms being so big she has to wear quite large shirts which is hard to be conservative.    Review of Systems  Constitutional: Negative.   HENT: Negative.    Eyes: Negative.   Respiratory: Negative.    Cardiovascular: Negative.   Gastrointestinal: Negative.   Endocrine: Negative.   Genitourinary: Negative.   Musculoskeletal: Negative.        Objective:   Physical Exam Vitals and nursing note reviewed.  Constitutional:      Appearance: Normal appearance.  HENT:     Head: Atraumatic.  Cardiovascular:     Rate and Rhythm: Normal rate.     Pulses: Normal pulses.  Pulmonary:     Effort: Pulmonary effort is normal.  Abdominal:     General: There is no distension.     Palpations: Abdomen is soft.  Musculoskeletal:        General: No swelling, tenderness or deformity.  Skin:    General: Skin is warm.     Capillary Refill: Capillary refill takes less than 2 seconds.  Neurological:     Mental Status: She is alert and oriented to person, place, and time.  Psychiatric:        Mood and Affect: Mood normal.        Behavior: Behavior normal.        Thought Content: Thought content normal.        Judgment: Judgment normal.         Assessment & Plan:     ICD-10-CM    1. Encounter for counseling  Z71.9     2. 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  The patient is a candidate for bilateral brachioplasty and revision of the suprapubic area for skin excision.  She would like to see if we can submit this to insurance for possible preauthorization.

## 2023-02-26 LAB — HEPATITIS PANEL, ACUTE
Hep A IgM: NONREACTIVE
Hep B C IgM: NONREACTIVE
Hepatitis B Surface Ag: NONREACTIVE
Hepatitis C Ab: NONREACTIVE

## 2023-03-04 ENCOUNTER — Telehealth: Payer: Self-pay | Admitting: Internal Medicine

## 2023-03-04 DIAGNOSIS — R1011 Right upper quadrant pain: Secondary | ICD-10-CM

## 2023-03-04 DIAGNOSIS — R10811 Right upper quadrant abdominal tenderness: Secondary | ICD-10-CM

## 2023-03-04 DIAGNOSIS — R160 Hepatomegaly, not elsewhere classified: Secondary | ICD-10-CM

## 2023-03-04 NOTE — Telephone Encounter (Signed)
The patient is still having right upper quadrant pain.  Her LFTs and acute hepatitis panel were normal.  She had a CT scan through [facility] that indicated her gallbladder was present though she has had a resection and she had mild hepatomegaly.  Given all of these issues we have decided to pursue an MRI.  Please order MR MRCP  liver with and without contrast  Encounter Diagnoses  Name Primary?   Hepatomegaly Yes   Abdominal pain, RUQ    Right upper quadrant abdominal tenderness without rebound tenderness

## 2023-03-05 ENCOUNTER — Other Ambulatory Visit: Payer: Self-pay

## 2023-03-05 ENCOUNTER — Ambulatory Visit: Payer: 59 | Admitting: Psychology

## 2023-03-05 DIAGNOSIS — R160 Hepatomegaly, not elsewhere classified: Secondary | ICD-10-CM

## 2023-03-05 DIAGNOSIS — R10811 Right upper quadrant abdominal tenderness: Secondary | ICD-10-CM

## 2023-03-05 NOTE — Telephone Encounter (Signed)
Order placed for MR/MRCP abd w w/o. Rad scheduling to contact pt to set up the appt.

## 2023-03-19 ENCOUNTER — Ambulatory Visit: Payer: 59 | Admitting: Psychology

## 2023-03-21 ENCOUNTER — Other Ambulatory Visit (HOSPITAL_COMMUNITY): Payer: Self-pay

## 2023-03-24 ENCOUNTER — Other Ambulatory Visit (HOSPITAL_COMMUNITY): Payer: Self-pay

## 2023-03-27 ENCOUNTER — Other Ambulatory Visit (HOSPITAL_COMMUNITY): Payer: Self-pay

## 2023-04-02 ENCOUNTER — Ambulatory Visit: Payer: 59 | Admitting: Psychology

## 2023-04-09 ENCOUNTER — Telehealth: Payer: Self-pay | Admitting: *Deleted

## 2023-04-09 NOTE — Telephone Encounter (Signed)
Message sent to [facility] for CPT codes

## 2023-04-29 ENCOUNTER — Other Ambulatory Visit (HOSPITAL_COMMUNITY): Payer: Self-pay

## 2023-04-29 ENCOUNTER — Other Ambulatory Visit: Payer: Self-pay

## 2023-04-29 MED ORDER — LISDEXAMFETAMINE DIMESYLATE 20 MG PO CHEW
20.0000 mg | CHEWABLE_TABLET | Freq: Two times a day (BID) | ORAL | 0 refills | Status: DC
Start: 2023-04-29 — End: 2023-06-05
  Filled 2023-04-29: qty 60, 30d supply, fill #0

## 2023-04-29 MED ORDER — ONDANSETRON 4 MG PO TBDP
4.0000 mg | ORAL_TABLET | Freq: Three times a day (TID) | ORAL | 0 refills | Status: AC | PRN
  Filled 2023-04-29: qty 20, 7d supply, fill #0

## 2023-05-07 ENCOUNTER — Telehealth: Payer: Self-pay | Admitting: Plastic Surgery

## 2023-05-07 NOTE — Telephone Encounter (Signed)
Called [facility] [employer] to start a medical nes review .... Spoke with Meda Coffee Ref# [id number] she states they do not do those. Continued to speak with her she was determined they did not do those

## 2023-05-26 ENCOUNTER — Telehealth: Payer: Self-pay | Admitting: *Deleted

## 2023-05-28 NOTE — Telephone Encounter (Signed)
Notified patient of denial. She wants to discuss cost of Mons revision with [place] further since she considers this a repair revision of previous surgery not being lifted tight enough.

## 2023-06-04 ENCOUNTER — Other Ambulatory Visit (HOSPITAL_COMMUNITY): Payer: Self-pay

## 2023-06-05 ENCOUNTER — Other Ambulatory Visit (HOSPITAL_COMMUNITY): Payer: Self-pay

## 2023-06-05 MED ORDER — LISDEXAMFETAMINE DIMESYLATE 20 MG PO CHEW
20.0000 mg | CHEWABLE_TABLET | Freq: Two times a day (BID) | ORAL | 0 refills | Status: AC
Start: 2023-06-05 — End: ?
  Filled 2023-06-05: qty 60, 30d supply, fill #0

## 2023-06-06 ENCOUNTER — Ambulatory Visit: Payer: 59 | Admitting: Student

## 2023-06-10 ENCOUNTER — Encounter: Payer: Self-pay | Admitting: Plastic Surgery

## 2023-06-10 ENCOUNTER — Ambulatory Visit (INDEPENDENT_AMBULATORY_CARE_PROVIDER_SITE_OTHER): Payer: 59 | Admitting: Plastic Surgery

## 2023-06-10 VITALS — BP 125/79 | HR 89 | Ht 63.0 in | Wt 172.2 lb

## 2023-06-10 DIAGNOSIS — E881 Lipodystrophy, not elsewhere classified: Secondary | ICD-10-CM

## 2023-06-10 DIAGNOSIS — Z9889 Other specified postprocedural states: Secondary | ICD-10-CM

## 2023-06-10 NOTE — Progress Notes (Signed)
Subjective:    Patient ID: ADRINNA [name], female    DOB: 1971/08/29, 52 y.o.   MRN: [id number]  HPI The patient is a 52 year old female here for evaluation of her mons area.  The patient underwent panniculectomy in the past after having decreased her weight from 330 pounds to 172 pounds.  In March 2020 she underwent a panniculectomy and had 4645 g removed.  The patient requested revision of the suprapubic area and this was done February 2022.  An area of 4 x 15 cm was excised.  She then had excision of accessory breast tissue done in July 2023.  The area of concern is the medial suprapubic area.  This area is difficult to keep clean with excess folding of the skin and irritation.  She has tried creams and lotions without appreciable effect   Review of Systems  Constitutional:  Positive for activity change. Negative for appetite change.  HENT: Negative.    Eyes: Negative.   Respiratory: Negative.    Cardiovascular: Negative.   Gastrointestinal: Negative.   Endocrine: Negative.   Genitourinary: Negative.   Musculoskeletal: Negative.   Skin:  Positive for rash.       Objective:   Physical Exam Vitals and nursing note reviewed.  Constitutional:      Appearance: Normal appearance.  HENT:     Head: Atraumatic.  Cardiovascular:     Rate and Rhythm: Normal rate.  Skin:    General: Skin is warm.  Neurological:     Mental Status: She is alert.  Psychiatric:        Mood and Affect: Mood normal.        Behavior: Behavior normal.        Thought Content: Thought content normal.        Judgment: Judgment normal.        Assessment & Plan:     ICD-10-CM   1. S/P panniculectomy  Z98.890     2. Lipodystrophy  E88.1        Patient requesting lower medial panniculectomy suprapubic site excision of excess skin due to continual bulge and moist area that is difficult to keep dry and clean.  Pictures were obtained of the patient and placed in the chart with the patient's or  guardian's permission.

## 2023-06-20 ENCOUNTER — Encounter: Payer: Self-pay | Admitting: Plastic Surgery

## 2023-08-12 ENCOUNTER — Encounter: Payer: 59 | Admitting: Physician Assistant

## 2023-08-12 NOTE — Progress Notes (Deleted)
Patient ID: Kristina [name], female    DOB: 23-Feb-1971, 52 y.o.   MRN: [id number]  No chief complaint on file.   No diagnosis found.   History of Present Illness: Kristina [name] is a 52 y.o.  female  with a history of extreme weight loss.  She presents for preoperative evaluation for upcoming procedure, excision of pannus/excessive suprapubic skin with liposuction, scheduled for 09/08/2023 with Dr. Ulice Bold.  The patient {HAS HAS [id number]} had problems with anesthesia. ***  Summary of Previous Visit: She was seen by Dr. Ulice Bold for consult 06/10/2023.  Patient reportedly had panniculectomy 11/2018 with 4645 g removed after extreme weight loss from 330 pounds to 172 pounds.  However, she is concerned about excess skin medially and suprapubic area that is difficult to keep clean and causing skin irritation refractory to medical management.  Discussed revision to medial panniculectomy site with excision of excess suprapubic skin and tissue due to continual bulge and difficulty keeping the area clean.  Job: ***  PMH Significant for: Excess skin following extreme weight loss, HTN, OSA, thyroid disorder, anxiety, IBS, s/p gastric bypass, s/p panniculectomy, asymptomatic dilated aortic root previously followed by Dr. Diona Browner with [facility].  She received clearance from their office last year for excision of excess axillary tissue bilaterally.  Deemed low perioperative cardiac risk for excision under general anesthesia and recommended follow-up in their office in 1 year for reimaging of her dilated aortic root.  She has not since returned.   Past Medical History: Allergies: Allergies  Allergen Reactions   Ampicillin Anaphylaxis    Has patient had a PCN reaction causing immediate rash, facial/tongue/throat swelling, SOB or lightheadedness with hypotension: Yes Has patient had a PCN reaction causing severe rash involving mucus membranes or skin necrosis: Yes Has patient had a PCN  reaction that required hospitalization Yes Has patient had a PCN reaction occurring within the last 10 years: No If all of the above answers are "NO", then may proceed with Cephalosporin use.    Codeine Hives and Shortness Of Breath    Tolerates morphine, fentanyl, hydromorphone, hydrocodone, and oxycodone.   Sulfa Antibiotics Itching and Rash   Sulfonamide Derivatives Itching and Rash    Current Medications:  Current Outpatient Medications:    ARIPiprazole (ABILIFY) 2 MG tablet, Take 2 mg by mouth daily., Disp: , Rfl:    buPROPion (WELLBUTRIN SR) 150 MG 12 hr tablet, Take 150 mg by mouth 2 (two) times daily., Disp: , Rfl:    estradiol (ESTRACE) 0.1 MG/GM vaginal cream, , Disp: , Rfl:    levocetirizine (XYZAL) 5 MG tablet, Take 5 mg by mouth at bedtime., Disp: , Rfl:    levothyroxine (SYNTHROID) 112 MCG tablet, Take 110 mcg by mouth daily before breakfast., Disp: , Rfl:    LINZESS 145 MCG CAPS capsule, Take 145 mcg by mouth as needed., Disp: , Rfl:    lisdexamfetamine (VYVANSE) 20 MG capsule, Take 20 mg by mouth daily., Disp: , Rfl:    Lisdexamfetamine Dimesylate 20 MG CHEW, Chew 1 tablet (20 mg total) by mouth 2 (two) times daily., Disp: 60 tablet, Rfl: 0   Multiple Vitamin (MULTIVITAMIN) tablet, Take 1 tablet by mouth daily., Disp: , Rfl:    ondansetron (ZOFRAN-ODT) 4 MG disintegrating tablet, Take 1 tablet (4 mg total) by mouth every 8 (eight) hours as needed for up to 20 doses for nausea or vomiting., Disp: 20 tablet, Rfl: 0   ondansetron (ZOFRAN-ODT) 4 MG disintegrating tablet, Dissolve 1 tablet (  4 mg total) in mouth every 8 (eight) hours as needed for nausea for up to 7 days, Disp: 20 tablet, Rfl: 0  Past Medical Problems: Past Medical History:  Diagnosis Date   Allergic rhinitis    Anemia    Anxiety    Aortic dilatation (HCC)    Asthma    Cervical cancer (HCC)    Chronic back pain    Chronic cough    Depression    Diverticulosis    Essential hypertension    GERD  (gastroesophageal reflux disease)    History of bronchitis    History of pneumonia    Hx of adenomatous and sessile serrated colonic polyps    Hyperlipidemia    Hypothyroidism    Lumbosacral spondylosis without myelopathy    Bulging disc - chronic pain   Obesity    Bariatric surgery   Obstructive sleep apnea    PONV (postoperative nausea and vomiting)    Restless leg    Tachycardia    Type 2 diabetes mellitus (HCC)    Vitamin D deficiency    Vocal cord dysfunction     Past Surgical History: Past Surgical History:  Procedure Laterality Date   ABDOMINAL HYSTERECTOMY  1996   CHOLECYSTECTOMY  1994   COLONOSCOPY WITH PROPOFOL N/A 03/10/2015   Procedure: COLONOSCOPY WITH PROPOFOL;  Surgeon: Iva Boop, MD;  Location: [facility];  Service: Endoscopy;  Laterality: N/A;   ESOPHAGOGASTRODUODENOSCOPY N/A 10/27/2013   Procedure: ESOPHAGOGASTRODUODENOSCOPY (EGD);  Surgeon: Iva Boop, MD;  Location: [facility];  Service: Endoscopy;  Laterality: N/A;   GASTRIC ROUX-EN-Y N/A 06/26/2021   Procedure: LAPAROSCOPIC ROUX-EN-Y GASTRIC BYPASS WITH UPPER ENDOSCOPY;  Surgeon: Gaynelle Adu, MD;  Location: [facility];  Service: General;  Laterality: N/A;   GYNECOMASTIA MASTECTOMY Bilateral 03/28/2022   Procedure: Excision of bilateral axillary tissue;  Surgeon: Peggye Form, DO;  Location: [facility];  Service: Plastics;  Laterality: Bilateral;  2 hours   HIATAL HERNIA REPAIR N/A 06/26/2021   Procedure: HERNIA REPAIR HIATAL;  Surgeon: Gaynelle Adu, MD;  Location: [facility];  Service: General;  Laterality: N/A;   KNEE ARTHROSCOPY Right    LAPAROSCOPIC GASTRIC SLEEVE RESECTION WITH HIATAL HERNIA REPAIR N/A 09/19/2015   Procedure: LAPAROSCOPIC GASTRIC SLEEVE RESECTION WITH HIATAL HERNIA REPAIR;  Surgeon: Gaynelle Adu, MD;  Location: [facility];  Service: General;  Laterality: N/A;   LEFT HEART CATH AND CORONARY ANGIOGRAPHY N/A 05/17/2019   Procedure: LEFT HEART CATH AND CORONARY  ANGIOGRAPHY;  Surgeon: Corky Crafts, MD;  [facility];  Service: Cardiovascular;  Laterality: N/A;   LIPOSUCTION N/A 11/26/2018   Procedure: LIPOSUCTION;  Surgeon: Peggye Form, DO;  Location: [facility];  Service: Plastics;  Laterality: N/A;   LIPOSUCTION N/A 11/02/2020   Procedure: with liposuction;  Surgeon: Peggye Form, DO;  Location: [facility];  Service: Plastics;  Laterality: N/A;   MASS EXCISION N/A 11/02/2020   Procedure: Excision of suprapubic area;  Surgeon: Peggye Form, DO;  Location: [facility];  Service: Plastics;  Laterality: N/A;  90 min   NOSE SURGERY  2004   ovaries removed     PANNICULECTOMY N/A 11/26/2018   Procedure: PANNICULECTOMY;  Surgeon: Peggye Form, DO;  Location: [facility];  Service: Plastics;  Laterality: N/A;   TUBAL LIGATION     UPPER GI ENDOSCOPY  09/19/2015   Procedure: UPPER GI ENDOSCOPY;  Surgeon: Gaynelle Adu, MD;  Location: [facility];  Service: General;;    Social History: Social History   Socioeconomic History   Marital status: Divorced    Spouse name: Not on file   Number of children: 3   Years of education: 12   Highest education level: 12th grade  Occupational History   Occupation: disabled    Associate Professor: [employer]  Tobacco Use   Smoking status: Never    Passive exposure: Never   Smokeless tobacco: Never  Vaping Use   Vaping status: Never Used  Substance and Sexual Activity   Alcohol use: No   Drug use: No   Sexual activity: Yes    Partners: Male    Birth control/protection: Surgical  Other Topics Concern   Not on file  Social History Narrative   Disabled   Prior Holiday representative and [employer]   Social Determinants of Health   Financial Resource Strain: Medium Risk (07/06/2023)   Received from [facility]   Overall Financial Resource Strain (CARDIA)    Difficulty of Paying Living Expenses: Somewhat hard   Food Insecurity: No Food Insecurity (07/06/2023)   Received from [facility]   Hunger Vital Sign    Worried About Running Out of Food in the Last Year: Never true    Ran Out of Food in the Last Year: Never true  Transportation Needs: No Transportation Needs (07/06/2023)   [facility] - [facility]    Lack of Transportation (Medical): No    Lack of Transportation (Non-Medical): No  Physical Activity: Insufficiently Active (07/06/2023)   Received from [facility]   Exercise Vital Sign    Days of Exercise per Week: 2 days    Minutes of Exercise per Session: 60 min  Stress: Stress Concern Present (07/06/2023)   Received from [facility] - Occupational Stress Questionnaire    Feeling of Stress : To some extent  Social Connections: Socially Integrated (07/06/2023)   Received from [facility]   Social Network    How would you rate your social network (family, work, friends)?: Good participation with social networks  Intimate Partner Violence: Not At Risk (07/06/2023)   Received from [facility]   HITS    Over the last 12 months how often did your partner physically hurt you?: Never    Over the last 12 months how often did your partner insult you or talk down to you?: Never    Over the last 12 months how often did your partner threaten you with physical harm?: Never    Over the last 12 months how often did your partner scream or curse at you?: Never    Family History: Family History  Problem Relation Age of Onset   Emphysema Mother    Allergies Mother    Heart disease Mother    Asthma Mother    Ovarian cancer Mother    Hypertension Mother    Diabetes Mother    High Cholesterol Mother    Stroke Mother    Thyroid disease Mother    Depression Mother    Anxiety disorder Mother    Heart disease Father        deceased at age 55 from heart attack   Sudden death Father    High Cholesterol Father     High blood pressure Father    Hyperthyroidism Sister    COPD Sister        died at 1   Sudden death Sister  Thyroid disease Brother        died at 65   Heart attack Brother        died at 46   COPD Brother    Colon cancer Brother 57   Hypertension Brother    Asthma Brother    Asthma Brother    Lung cancer Maternal Aunt    Stroke Maternal Grandmother    Heart disease Maternal Grandmother    Cancer Maternal Grandfather    Heart disease Paternal Grandmother    Multiple sclerosis Paternal Grandmother    Heart attack Paternal Grandfather    Hypertension Daughter    Asthma Son    Asthma Son    Allergies Child    Esophageal cancer Neg Hx     Review of Systems: ROS  Physical Exam: Vital Signs There were no vitals taken for this visit.  Physical Exam *** Constitutional:      General: Not in acute distress.    Appearance: Normal appearance. Not ill-appearing.  HENT:     Head: Normocephalic and atraumatic.  Eyes:     Pupils: Pupils are equal, round. Cardiovascular:     Rate and Rhythm: Normal rate.    Pulses: Normal pulses.  Pulmonary:     Effort: No respiratory distress or increased work of breathing.  Speaks in full sentences. Abdominal:     General: Abdomen is flat. No distension.   Musculoskeletal: Normal range of motion. No lower extremity swelling or edema. No varicosities. *** Skin:    General: Skin is warm and dry.     Findings: No erythema or rash.  Neurological:     Mental Status: Alert and oriented to person, place, and time.  Psychiatric:        Mood and Affect: Mood normal.        Behavior: Behavior normal.    Assessment/Plan: The patient is scheduled for excision of pannus/excessive suprapubic skin with liposuction with Dr. Ulice Bold.  Risks, benefits, and alternatives of procedure discussed, questions answered and consent obtained.    Smoking Status: ***; Counseling Given? ***  Caprini Score: ***; Risk Factors include: ***, BMI *** 25, and  length of planned surgery. Recommendation for mechanical *** pharmacological prophylaxis. Encourage early ambulation.   Pictures obtained: 06/10/2023  Post-op Rx sent to pharmacy: ***  Patient was provided with the General Surgical Risk consent document and Pain Medication Agreement prior to their appointment.  They had adequate time to read through the risk consent documents and Pain Medication Agreement. We also discussed them in person together during this preop appointment. All of their questions were answered to their satisfaction.  Recommended calling if they have any further questions.  Risk consent form and Pain Medication Agreement to be scanned into patient's chart.    Electronically signed by: Evelena Leyden, PA-C 08/12/2023 8:01 AM

## 2023-08-16 ENCOUNTER — Encounter (HOSPITAL_COMMUNITY): Payer: Self-pay | Admitting: Radiology

## 2023-08-16 ENCOUNTER — Emergency Department (HOSPITAL_COMMUNITY)
Admission: EM | Admit: 2023-08-16 | Discharge: 2023-08-16 | Disposition: A | Discharge status: Home / Self Care | Payer: 59 | Attending: Emergency Medicine | Admitting: Emergency Medicine

## 2023-08-16 ENCOUNTER — Other Ambulatory Visit: Payer: Self-pay

## 2023-08-16 DIAGNOSIS — H9201 Otalgia, right ear: Secondary | ICD-10-CM | POA: Insufficient documentation

## 2023-08-16 DIAGNOSIS — Z20822 Contact with and (suspected) exposure to covid-19: Secondary | ICD-10-CM | POA: Insufficient documentation

## 2023-08-16 DIAGNOSIS — E039 Hypothyroidism, unspecified: Secondary | ICD-10-CM | POA: Insufficient documentation

## 2023-08-16 DIAGNOSIS — E785 Hyperlipidemia, unspecified: Secondary | ICD-10-CM | POA: Diagnosis not present

## 2023-08-16 DIAGNOSIS — Z8541 Personal history of malignant neoplasm of cervix uteri: Secondary | ICD-10-CM | POA: Diagnosis not present

## 2023-08-16 DIAGNOSIS — Z79899 Other long term (current) drug therapy: Secondary | ICD-10-CM | POA: Diagnosis not present

## 2023-08-16 DIAGNOSIS — Z9884 Bariatric surgery status: Secondary | ICD-10-CM | POA: Diagnosis not present

## 2023-08-16 DIAGNOSIS — E119 Type 2 diabetes mellitus without complications: Secondary | ICD-10-CM | POA: Diagnosis not present

## 2023-08-16 DIAGNOSIS — K219 Gastro-esophageal reflux disease without esophagitis: Secondary | ICD-10-CM | POA: Diagnosis not present

## 2023-08-16 DIAGNOSIS — G4733 Obstructive sleep apnea (adult) (pediatric): Secondary | ICD-10-CM | POA: Insufficient documentation

## 2023-08-16 DIAGNOSIS — K589 Irritable bowel syndrome without diarrhea: Secondary | ICD-10-CM | POA: Insufficient documentation

## 2023-08-16 DIAGNOSIS — J029 Acute pharyngitis, unspecified: Secondary | ICD-10-CM | POA: Insufficient documentation

## 2023-08-16 LAB — RESP PANEL BY RT-PCR (RSV, FLU A&B, COVID)  RVPGX2
Influenza A by PCR: NEGATIVE
Influenza B by PCR: NEGATIVE
Resp Syncytial Virus by PCR: NEGATIVE
SARS Coronavirus 2 by RT PCR: NEGATIVE

## 2023-08-16 LAB — GROUP A STREP BY PCR: Group A Strep by PCR: NOT DETECTED

## 2023-08-16 MED ORDER — DEXAMETHASONE 2 MG PO TABS: 10.0000 mg | ORAL_TABLET | Freq: Once | ORAL | 0 refills | Status: DC

## 2023-08-16 MED ORDER — LIDOCAINE VISCOUS HCL 2 % MT SOLN
15.0000 mL | Freq: Once | OROMUCOSAL | Status: AC
  Administered 2023-08-16: 15 mL via OROMUCOSAL
  Filled 2023-08-16: qty 15

## 2023-08-16 MED ORDER — CLINDAMYCIN HCL 300 MG PO CAPS: 300.0000 mg | ORAL_CAPSULE | Freq: Three times a day (TID) | ORAL | 0 refills | Status: AC

## 2023-08-16 MED ORDER — IBUPROFEN 600 MG PO TABS: 600.0000 mg | ORAL_TABLET | Freq: Four times a day (QID) | ORAL | 0 refills | Status: AC | PRN

## 2023-08-16 MED ORDER — DEXAMETHASONE 0.5 MG/5ML PO SOLN: 10.0000 mg | Freq: Once | ORAL | 0 refills | Status: AC

## 2023-08-16 MED ORDER — IBUPROFEN 400 MG PO TABS
600.0000 mg | ORAL_TABLET | Freq: Once | ORAL | Status: AC
  Administered 2023-08-16: 600 mg via ORAL
  Filled 2023-08-16: qty 2

## 2023-08-16 NOTE — Discharge Instructions (Signed)
As a gust, workup today overall reassuring.  Your strep, COVID, flu, RSV testing were negative.  Given appearance of your right ear, will send you home with antibiotics for treatment of your infection.  Recommend additionally Cepacol throat lozenges, anti-inflammatories taken for soreness of throat.  Recommend follow-up with primary care for reassessment of your symptoms.  Please do not hesitate to return if the worrisome signs and symptoms we discussed become apparent.

## 2023-08-16 NOTE — ED Triage Notes (Signed)
Pt states yesterday she began having drainage into the back of her throat and today her right ear hurts and she feels like she can't swallow because of the pain in her throat.

## 2023-08-16 NOTE — ED Notes (Signed)
Pt given 12 oz cup of ice water.

## 2023-08-16 NOTE — ED Provider Notes (Signed)
[place] EMERGENCY DEPARTMENT AT [facility] Provider Note   CSN: [id number] Arrival date & time: 08/16/23  1059     History  Chief Complaint  Patient presents with   Otalgia   Sore Throat    Kristina [name] is a 52 y.o. female.   Otalgia Sore Throat   52 year old female presents emergency department with complaints of right-sided ear pain, sore throat and drainage in the back of her throat.  States that symptoms began yesterday.  Denies any known sick contact.  Reports ability to swallow liquids since onset.  Denies any changes in voice, swelling of the tongue, fever, cough.  Has tried over-the-counter liquid Tylenol which has helped some with symptoms.  Presents emergency department for further assessment.  Past medical history significant for cervical cancer, diabetes mellitus type 2 given aortic dilation, GERD, hypothyroidism, hyperlipidemia, OSA, allergic rhinitis, IBS, gastric bypass  Home Medications Prior to Admission medications   Medication Sig Start Date End Date Taking? Authorizing Provider  clindamycin (CLEOCIN) 300 MG capsule Take 1 capsule (300 mg total) by mouth 3 (three) times daily for 10 days. 08/16/23 08/26/23 Yes Sherian Maroon A, PA  dexamethasone (DECADRON) 0.5 MG/5ML solution Take 100 mLs (10 mg total) by mouth once for 1 dose. 08/16/23 08/16/23 Yes Sherian Maroon A, PA  ibuprofen (ADVIL) 600 MG tablet Take 1 tablet (600 mg total) by mouth every 6 (six) hours as needed. 08/16/23  Yes Sherian Maroon A, PA  ARIPiprazole (ABILIFY) 2 MG tablet Take 2 mg by mouth daily.    [provider]  buPROPion (WELLBUTRIN SR) 150 MG 12 hr tablet Take 150 mg by mouth 2 (two) times daily. 12/11/21   [provider]  estradiol (ESTRACE) 0.1 MG/GM vaginal cream     [provider]  levocetirizine (XYZAL) 5 MG tablet Take 5 mg by mouth at bedtime.    [provider]  levothyroxine (SYNTHROID) 112 MCG tablet Take 110 mcg by  mouth daily before breakfast. 11/24/19   [provider]  LINZESS 145 MCG CAPS capsule Take 145 mcg by mouth as needed. 11/28/22   [provider]  lisdexamfetamine (VYVANSE) 20 MG capsule Take 20 mg by mouth daily.    [provider]  Lisdexamfetamine Dimesylate 20 MG CHEW Chew 1 tablet (20 mg total) by mouth 2 (two) times daily. 06/05/23     Multiple Vitamin (MULTIVITAMIN) tablet Take 1 tablet by mouth daily.    [provider]  ondansetron (ZOFRAN-ODT) 4 MG disintegrating tablet Take 1 tablet (4 mg total) by mouth every 8 (eight) hours as needed for up to 20 doses for nausea or vomiting. 03/13/22   Laurena Spies, PA-C  ondansetron (ZOFRAN-ODT) 4 MG disintegrating tablet Dissolve 1 tablet (4 mg total) in mouth every 8 (eight) hours as needed for nausea for up to 7 days 04/29/23         Allergies    Ampicillin, Codeine, Sulfa antibiotics, and Sulfonamide derivatives    Review of Systems   Review of Systems  HENT:  Positive for ear pain.   All other systems reviewed and are negative.   Physical Exam Updated Vital Signs BP 119/79 (BP Location: Left Arm)   Pulse 89   Temp 98.3 F (36.8 C) (Oral)   Resp 19   Ht 5\' 3"  (1.6 m)   Wt 74.4 kg   SpO2 99%   BMI 29.05 kg/m  Physical Exam Vitals and nursing note reviewed.  Constitutional:  General: She is not in acute distress.    Appearance: She is well-developed.  HENT:     Head: Normocephalic and atraumatic.     Right Ear: Tympanic membrane is erythematous.     Left Ear: Tympanic membrane normal.     Nose: Congestion present. No rhinorrhea.     Mouth/Throat:     Comments: Moderate posterior pharyngeal erythema.  Uvula midline rise symmetric with phonation.  Tonsils 2+ bilaterally without obvious exudate.  No sublingual or submandibular swelling.  No trismus.  No changes in phonation per patient.  Patient tolerating oral secretions without difficulty. Eyes:     Conjunctiva/sclera: Conjunctivae  normal.  Cardiovascular:     Rate and Rhythm: Normal rate and regular rhythm.     Heart sounds: No murmur heard. Pulmonary:     Effort: Pulmonary effort is normal. No respiratory distress.     Breath sounds: Normal breath sounds. No wheezing, rhonchi or rales.  Abdominal:     Palpations: Abdomen is soft.     Tenderness: There is no abdominal tenderness.  Musculoskeletal:        General: No swelling.     Cervical back: Neck supple.  Skin:    General: Skin is warm and dry.     Capillary Refill: Capillary refill takes less than 2 seconds.  Neurological:     Mental Status: She is alert.  Psychiatric:        Mood and Affect: Mood normal.     ED Results / Procedures / Treatments   Labs (all labs ordered are listed, but only abnormal results are displayed) Labs Reviewed  RESP PANEL BY RT-PCR (RSV, FLU A&B, COVID)  [id number]  GROUP A STREP BY PCR    EKG None  Radiology No results found.  Procedures Procedures    Medications Ordered in ED Medications  lidocaine (XYLOCAINE) 2 % viscous mouth solution 15 mL (15 mLs Mouth/Throat Given 08/16/23 1128)  ibuprofen (ADVIL) tablet 600 mg (600 mg Oral Given 08/16/23 1128)    ED Course/ Medical Decision Making/ A&P                                 Medical Decision Making Risk Prescription drug management.   This patient presents to the ED for concern of sore throat, ear pain, this involves an extensive number of treatment options, and is a complaint that carries with it a high risk of complications and morbidity.  The differential diagnosis includes viral URI, COVID, influenza, RSV, strep pharyngitis, PTA, Ludwig angina, retropharyngeal abscess, other   Co morbidities that complicate the patient evaluation  See HPI   Additional history obtained:  Additional history obtained from EMR External records from outside source obtained and reviewed including hospital records   Lab Tests:  I Ordered, and personally interpreted  labs.  The pertinent results include: Group A strep respiratory viral panel negative   Imaging Studies ordered:  N/a   Cardiac Monitoring: / EKG:  The patient was maintained on a cardiac monitor.  I personally viewed and interpreted the cardiac monitored which showed an underlying rhythm of: Sinus rhythm   Consultations Obtained:  N//a   Problem List / ED Course / Critical interventions / Medication management  Sore throat, ear pain I ordered medication including viscous lidocaine, ibuprofen   Reevaluation of the patient after these medicines showed that the patient improved I have reviewed the patients home medicines and have made adjustments  as needed   Social Determinants of Health:  Denies tobacco, licit drug use   Test / Admission - Considered:  Sore throat, right ear pain Vitals signs within normal range and stable throughout visit. Laboratory studies significant for: See above 52 year old female presents emergency department with complaints of symptoms of sore throat as well as right ear pain that began yesterday.  On exam, patient without clinical evidence of PTA, Ludwig angina,retropharyngeal abscess.  Right ear appears erythematous to the tympanic membrane.  Patient treated with medications on the ED and did note improvement of symptoms with subsequent tolerance of p.o.  Suspect that patient's symptoms are most likely secondary to upper respiratory viral process.  Given slight erythematous appearing of tympanic membrane, discussion was had with patient regarding trial of OTC medications versus beginning of antibiotics.  Patient elected for beginning of antibiotics.  Will recommend follow-up with primary care for reevaluation of symptoms.  Treatment plan discussed at length with patient and she acknowledged understand was agreeable to said plan.  Patient overall well-appearing, afebrile in no acute distress. Worrisome signs and symptoms were discussed with the patient,  and the patient acknowledged understanding to return to the ED if noticed. Patient was stable upon discharge.          Final Clinical Impression(s) / ED Diagnoses Final diagnoses:  Sore throat  Right ear pain    Rx / DC Orders      Peter Garter, PA 08/16/23 1510    Pricilla Loveless, MD 08/17/23 [phone]

## 2023-08-22 ENCOUNTER — Other Ambulatory Visit (HOSPITAL_COMMUNITY): Payer: Self-pay

## 2023-08-22 MED ORDER — LISDEXAMFETAMINE DIMESYLATE 20 MG PO CHEW
20.0000 mg | CHEWABLE_TABLET | Freq: Two times a day (BID) | ORAL | 0 refills | Status: AC
  Filled 2023-08-22: qty 60, 30d supply, fill #0

## 2023-08-25 ENCOUNTER — Other Ambulatory Visit (HOSPITAL_COMMUNITY): Payer: Self-pay

## 2023-09-19 ENCOUNTER — Encounter: Payer: 59 | Admitting: Plastic Surgery

## 2023-10-30 ENCOUNTER — Encounter (HOSPITAL_COMMUNITY): Payer: Self-pay

## 2023-10-30 ENCOUNTER — Other Ambulatory Visit (HOSPITAL_COMMUNITY): Payer: Self-pay

## 2023-10-30 ENCOUNTER — Other Ambulatory Visit: Payer: Self-pay

## 2023-10-30 MED ORDER — LISDEXAMFETAMINE DIMESYLATE 20 MG PO CHEW
20.0000 mg | CHEWABLE_TABLET | Freq: Two times a day (BID) | ORAL | 0 refills | Status: AC
  Filled 2023-10-30: qty 60, 30d supply, fill #0

## 2023-11-04 ENCOUNTER — Other Ambulatory Visit (HOSPITAL_COMMUNITY): Payer: Self-pay

## 2024-01-30 ENCOUNTER — Encounter (HOSPITAL_COMMUNITY): Payer: Self-pay | Admitting: *Deleted

## 2024-02-26 ENCOUNTER — Emergency Department (HOSPITAL_COMMUNITY)

## 2024-02-26 ENCOUNTER — Encounter: Payer: Self-pay | Admitting: Hematology and Oncology

## 2024-02-26 ENCOUNTER — Encounter (HOSPITAL_COMMUNITY): Payer: Self-pay | Admitting: Emergency Medicine

## 2024-02-26 ENCOUNTER — Emergency Department (HOSPITAL_COMMUNITY)
Admission: EM | Admit: 2024-02-26 | Discharge: 2024-02-26 | Disposition: A | Discharge status: Home / Self Care | Attending: Emergency Medicine | Admitting: Emergency Medicine

## 2024-02-26 ENCOUNTER — Other Ambulatory Visit: Payer: Self-pay

## 2024-02-26 DIAGNOSIS — J45909 Unspecified asthma, uncomplicated: Secondary | ICD-10-CM | POA: Insufficient documentation

## 2024-02-26 DIAGNOSIS — E119 Type 2 diabetes mellitus without complications: Secondary | ICD-10-CM | POA: Insufficient documentation

## 2024-02-26 DIAGNOSIS — Z8541 Personal history of malignant neoplasm of cervix uteri: Secondary | ICD-10-CM | POA: Insufficient documentation

## 2024-02-26 DIAGNOSIS — I10 Essential (primary) hypertension: Secondary | ICD-10-CM | POA: Diagnosis not present

## 2024-02-26 DIAGNOSIS — Y9259 Other trade areas as the place of occurrence of the external cause: Secondary | ICD-10-CM | POA: Insufficient documentation

## 2024-02-26 DIAGNOSIS — E039 Hypothyroidism, unspecified: Secondary | ICD-10-CM | POA: Insufficient documentation

## 2024-02-26 DIAGNOSIS — W19XXXA Unspecified fall, initial encounter: Secondary | ICD-10-CM | POA: Diagnosis not present

## 2024-02-26 DIAGNOSIS — S060XAA Concussion with loss of consciousness status unknown, initial encounter: Secondary | ICD-10-CM | POA: Diagnosis not present

## 2024-02-26 DIAGNOSIS — Z79899 Other long term (current) drug therapy: Secondary | ICD-10-CM | POA: Insufficient documentation

## 2024-02-26 DIAGNOSIS — S0990XA Unspecified injury of head, initial encounter: Secondary | ICD-10-CM | POA: Diagnosis present

## 2024-02-26 DIAGNOSIS — S0101XA Laceration without foreign body of scalp, initial encounter: Secondary | ICD-10-CM | POA: Diagnosis not present

## 2024-02-26 DIAGNOSIS — S161XXA Strain of muscle, fascia and tendon at neck level, initial encounter: Secondary | ICD-10-CM | POA: Insufficient documentation

## 2024-02-26 LAB — COMPREHENSIVE METABOLIC PANEL WITH GFR
ALT: 24 U/L (ref 0–44)
AST: 27 U/L (ref 15–41)
Albumin: 3.7 g/dL (ref 3.5–5.0)
Alkaline Phosphatase: 77 U/L (ref 38–126)
Anion gap: 10 (ref 5–15)
BUN: 8 mg/dL (ref 6–20)
CO2: 21 mmol/L — ABNORMAL LOW (ref 22–32)
Calcium: 8.5 mg/dL — ABNORMAL LOW (ref 8.9–10.3)
Chloride: 99 mmol/L (ref 98–111)
Creatinine, Ser: 0.62 mg/dL (ref 0.44–1.00)
GFR, Estimated: >60 mL/min
Glucose, Bld: 83 mg/dL (ref 70–99)
Potassium: 3.2 mmol/L — ABNORMAL LOW (ref 3.5–5.1)
Sodium: 130 mmol/L — ABNORMAL LOW (ref 135–145)
Total Bilirubin: 0.3 mg/dL (ref 0.0–1.2)
Total Protein: 6.6 g/dL (ref 6.5–8.1)

## 2024-02-26 LAB — CBC
HCT: 36.5 % (ref 36.0–46.0)
Hemoglobin: 12.6 g/dL (ref 12.0–15.0)
MCH: 31.5 pg (ref 26.0–34.0)
MCHC: 34.5 g/dL (ref 30.0–36.0)
MCV: 91.3 fL (ref 80.0–100.0)
Platelets: 191 10*3/uL (ref 150–400)
RBC: 4 MIL/uL (ref 3.87–5.11)
RDW: 12.5 % (ref 11.5–15.5)
WBC: 6.6 10*3/uL (ref 4.0–10.5)
nRBC: 0 % (ref 0.0–0.2)

## 2024-02-26 LAB — ETHANOL: Alcohol, Ethyl (B): 274 mg/dL — ABNORMAL HIGH

## 2024-02-26 LAB — CBG MONITORING, ED: Glucose-Capillary: 89 mg/dL (ref 70–99)

## 2024-02-26 MED ORDER — TETANUS-DIPHTH-ACELL PERTUSSIS 5-2.5-18.5 LF-MCG/0.5 IM SUSY: 0.5000 mL | PREFILLED_SYRINGE | Freq: Once | INTRAMUSCULAR | Status: DC

## 2024-02-26 NOTE — ED Notes (Signed)
 Pt left ED, awaiting ride.

## 2024-02-26 NOTE — ED Triage Notes (Signed)
 [place] in [place] via [facility] after syncopal fall in the bathroom. Posterior head pain and laceration present and covered with gauze and kerlex, bleeding controlled. Pt not on thinners, arrives a&ox4 but drowsy, states she does not recall falling. C-collar in place on arrival. Endorses 3 beers VS enroute: 140/80 78HR 98%RA

## 2024-02-26 NOTE — ED Notes (Signed)
 Patient transported to CT

## 2024-02-26 NOTE — ED Provider Notes (Signed)
 [facility] AT [facility] Provider Note   CSN: [id number] Arrival date & time: 02/26/24  0012     History  Chief Complaint  Patient presents with   Head Injury   Loss of Consciousness    Kristina [name] is a 53 y.o. female.  The history is provided by the patient.  Patient with history of anxiety, chronic pain, diabetes Presents via EMS after falling at a bar.  This report patient had a syncopal episode.  It is reported she had a laceration to her scalp. Patient initially reported only drinking 3 beers.  On my arrival to the room she is sitting up at edge of bed with sunglasses calling for a ride home  She is not on anticoagulation Past Medical History:  Diagnosis Date   Allergic rhinitis    Anemia    Anxiety    Aortic dilatation (HCC)    Asthma    Cervical cancer (HCC)    Chronic back pain    Chronic cough    Depression    Diverticulosis    Essential hypertension    GERD (gastroesophageal reflux disease)    History of bronchitis    History of pneumonia    Hx of adenomatous and sessile serrated colonic polyps    Hyperlipidemia    Hypothyroidism    Lumbosacral spondylosis without myelopathy    Bulging disc - chronic pain   Obesity    Bariatric surgery   Obstructive sleep apnea    PONV (postoperative nausea and vomiting)    Restless leg    Tachycardia    Type 2 diabetes mellitus (HCC)    Vitamin D  deficiency    Vocal cord dysfunction     Home Medications Prior to Admission medications   Medication Sig Start Date End Date Taking? Authorizing Provider  ARIPiprazole (ABILIFY) 2 MG tablet Take 2 mg by mouth daily.    [provider]  buPROPion  (WELLBUTRIN  SR) 150 MG 12 hr tablet Take 150 mg by mouth 2 (two) times daily. 12/11/21   [provider]  estradiol  (ESTRACE ) 0.1 MG/GM vaginal cream     [provider]  ibuprofen  (ADVIL ) 600 MG tablet Take 1 tablet (600 mg total) by mouth every 6 (six) hours as  needed. 08/16/23   Cochise Butter, PA  levocetirizine (XYZAL) 5 MG tablet Take 5 mg by mouth at bedtime.    [provider]  levothyroxine  (SYNTHROID ) 112 MCG tablet Take 110 mcg by mouth daily before breakfast. 11/24/19   [provider]  LINZESS 145 MCG CAPS capsule Take 145 mcg by mouth as needed. 11/28/22   [provider]  lisdexamfetamine (VYVANSE ) 20 MG capsule Take 20 mg by mouth daily.    [provider]  Lisdexamfetamine Dimesylate  20 MG CHEW Chew 1 tablet (20 mg total) by mouth 2 (two) times daily. 06/05/23     Lisdexamfetamine Dimesylate  20 MG CHEW Chew 1 tablet (20 mg total) by mouth 2 (two) times daily. 08/22/23     Lisdexamfetamine Dimesylate  20 MG CHEW Chew 1 tablet (20 mg total) by mouth 2 (two) times daily. 10/30/23     Multiple Vitamin (MULTIVITAMIN) tablet Take 1 tablet by mouth daily.    [provider]  ondansetron  (ZOFRAN -ODT) 4 MG disintegrating tablet Take 1 tablet (4 mg total) by mouth every 8 (eight) hours as needed for up to 20 doses for nausea or vomiting. 03/13/22   Harden Leyden, PA-C  ondansetron  (ZOFRAN -ODT) 4 MG disintegrating  tablet Dissolve 1 tablet (4 mg total) in mouth every 8 (eight) hours as needed for nausea for up to 7 days 04/29/23         Allergies    Ampicillin, Codeine, Sulfa antibiotics, and Sulfonamide derivatives    Review of Systems   Review of Systems  Skin:  Positive for wound.  Neurological:  Positive for syncope.    Physical Exam Updated Vital Signs BP 111/79   Pulse 74   Temp 98.2 F (36.8 C) (Oral)   Resp 13   Wt 74.4 kg   SpO2 98%   BMI 29.06 kg/m  Physical Exam CONSTITUTIONAL: Disheveled, no acute distress HEAD: Small abrasion to posterior scalp, bleeding controlled, no other lacerations, no step-offs or crepitus EYES wearing sunglasses ENMT: Mucous membranes moist, no facial trauma NECK: supple no meningeal signs SPINE/BACK:entire spine nontender No  bruising/crepitance/stepoffs noted to spine CV: S1/S2 noted, no murmurs/rubs/gallops noted LUNGS: Lungs are clear to auscultation bilaterally, no apparent distress Chest-no tenderness ABDOMEN: soft, nontender NEURO: Pt is awake/alert/appropriate, moves all extremitiesx4.  No facial droop.   EXTREMITIES: pulses normal/equal, full ROM, no deformities SKIN: warm, color normal   ED Results / Procedures / Treatments   Labs (all labs ordered are listed, but only abnormal results are displayed) Labs Reviewed  COMPREHENSIVE METABOLIC PANEL WITH GFR - Abnormal; Notable for the following components:      Result Value   Sodium 130 (*)    Potassium 3.2 (*)    CO2 21 (*)    Calcium  8.5 (*)    All other components within normal limits  ETHANOL - Abnormal; Notable for the following components:   Alcohol, Ethyl (B) 274 (*)    All other components within normal limits  CBC  URINALYSIS, ROUTINE W REFLEX MICROSCOPIC  CBG MONITORING, ED    EKG EKG Interpretation Date/Time:  Thursday February 26 2024 00:44:08 EDT Ventricular Rate:  74 PR Interval:  224 QRS Duration:  117 QT Interval:  420 QTC Calculation: 466 R Axis:   82  Text Interpretation: Sinus rhythm Prolonged PR interval Nonspecific intraventricular conduction delay Low voltage, precordial leads Confirmed by Eldon Greenland ([id number]) on 02/26/2024 12:54:25 AM  Radiology CT HEAD WO CONTRAST Result Date: 02/26/2024 CLINICAL DATA:  Moderate to severe head trauma, syncopal fall in bathroom. Posterior head pain and laceration. EXAM: CT HEAD WITHOUT CONTRAST CT CERVICAL SPINE WITHOUT CONTRAST TECHNIQUE: Multidetector CT imaging of the head and cervical spine was performed following the standard protocol without intravenous contrast. Multiplanar CT image reconstructions of the cervical spine were also generated. RADIATION DOSE REDUCTION: This exam was performed according to the departmental dose-optimization program which includes automated exposure  control, adjustment of the mA and/or kV according to patient size and/or use of iterative reconstruction technique. COMPARISON:  CT head 05/04/2014 and MRI lumbar spine 04/05/2009 FINDINGS: CT HEAD FINDINGS Brain: No intracranial hemorrhage, mass effect, or evidence of acute infarct. No hydrocephalus. No extra-axial fluid collection. Vascular: No hyperdense vessel or unexpected calcification. Skull: No fracture or focal lesion. Left posterior scalp hematoma and laceration. Sinuses/Orbits: Partially visualized mucosal thickening and air-fluid level in the right maxillary sinus. Correlate for acute sinusitis. Globes are intact. No mastoid effusion. Other: None. CT CERVICAL SPINE FINDINGS Alignment: No evidence of traumatic malalignment. Skull base and vertebrae: No acute fracture. No primary bone lesion or focal pathologic process. Soft tissues and spinal canal: No prevertebral fluid or swelling. No visible canal hematoma. Disc levels: Mild multilevel spondylosis. Posterior osteophyte at the superior endplate of  C6 causes mild effacement of the ventral thecal sac. No severe spinal canal narrowing. Upper chest: Negative. Other: None. IMPRESSION: 1. No acute intracranial abnormality. Left posterior scalp hematoma and laceration. 2. No cervical spine fracture. 3. Partially visualized mucosal thickening and air-fluid level in the right maxillary sinus. Correlate for acute sinusitis. Electronically Signed   By: Rozell Cornet M.D.   On: 02/26/2024 00:59   CT Cervical Spine Wo Contrast Result Date: 02/26/2024 CLINICAL DATA:  Moderate to severe head trauma, syncopal fall in bathroom. Posterior head pain and laceration. EXAM: CT HEAD WITHOUT CONTRAST CT CERVICAL SPINE WITHOUT CONTRAST TECHNIQUE: Multidetector CT imaging of the head and cervical spine was performed following the standard protocol without intravenous contrast. Multiplanar CT image reconstructions of the cervical spine were also generated. RADIATION DOSE  REDUCTION: This exam was performed according to the departmental dose-optimization program which includes automated exposure control, adjustment of the mA and/or kV according to patient size and/or use of iterative reconstruction technique. COMPARISON:  CT head 05/04/2014 and MRI lumbar spine 04/05/2009 FINDINGS: CT HEAD FINDINGS Brain: No intracranial hemorrhage, mass effect, or evidence of acute infarct. No hydrocephalus. No extra-axial fluid collection. Vascular: No hyperdense vessel or unexpected calcification. Skull: No fracture or focal lesion. Left posterior scalp hematoma and laceration. Sinuses/Orbits: Partially visualized mucosal thickening and air-fluid level in the right maxillary sinus. Correlate for acute sinusitis. Globes are intact. No mastoid effusion. Other: None. CT CERVICAL SPINE FINDINGS Alignment: No evidence of traumatic malalignment. Skull base and vertebrae: No acute fracture. No primary bone lesion or focal pathologic process. Soft tissues and spinal canal: No prevertebral fluid or swelling. No visible canal hematoma. Disc levels: Mild multilevel spondylosis. Posterior osteophyte at the superior endplate of C6 causes mild effacement of the ventral thecal sac. No severe spinal canal narrowing. Upper chest: Negative. Other: None. IMPRESSION: 1. No acute intracranial abnormality. Left posterior scalp hematoma and laceration. 2. No cervical spine fracture. 3. Partially visualized mucosal thickening and air-fluid level in the right maxillary sinus. Correlate for acute sinusitis. Electronically Signed   By: Rozell Cornet M.D.   On: 02/26/2024 00:59    Procedures Procedures    Medications Ordered in ED Medications  Tdap (BOOSTRIX) injection 0.5 mL (has no administration in time range)    ED Course/ Medical Decision Making/ A&P                                 Medical Decision Making Amount and/or Complexity of Data Reviewed Labs: ordered. Radiology:  ordered.  Risk Prescription drug management.   This patient presents to the ED for concern of fall with head injury, this involves an extensive number of treatment options, and is a complaint that carries with it a high risk of complications and morbidity.  The differential diagnosis includes but is not limited to subdural hematoma, subarachnoid hemorrhage, skull fracture, concussion    Comorbidities that complicate the patient evaluation: Patient's presentation is complicated by their history of diabetes  Social Determinants of Health: Patient's alcohol use  increases the complexity of managing their presentation  Lab Tests: I Ordered, and personally interpreted labs.  The pertinent results include: Elevated alcohol level consistent with alcohol intoxication  Imaging Studies ordered: I ordered imaging studies including CT scan head and C-spine  I independently visualized and interpreted imaging which showed no acute findings I agree with the radiologist interpretation   Reevaluation: After the interventions noted above, I reevaluated the  patient and found that they have :improved  Complexity of problems addressed: Patient's presentation is most consistent with  acute presentation with potential threat to life or bodily function  Disposition: After consideration of the diagnostic results and the patient's response to treatment,  I feel that the patent would benefit from discharge  .    By the time I was able to evaluate the patient, she was already sitting up trying to leave the hospital.  We discussed findings.  She likely passed out due to alcohol abuse.  Patient has ride home       Final Clinical Impression(s) / ED Diagnoses Final diagnoses:  Concussion with unknown loss of consciousness status, initial encounter  Laceration of scalp, initial encounter  Strain of neck muscle, initial encounter    Rx / DC Orders ED Discharge Orders     None          Eldon Greenland, MD 02/26/24 [phone]
